# Patient Record
Sex: Male | Born: 1935 | Race: White | Hispanic: No | Smoking: Former smoker
Health system: Southern US, Community
[De-identification: ages and names within clinical notes are randomized; demographics above are authoritative.]

## PROBLEM LIST (undated history)

## (undated) DIAGNOSIS — M5417 Radiculopathy, lumbosacral region: Secondary | ICD-10-CM

## (undated) DIAGNOSIS — I255 Ischemic cardiomyopathy: Secondary | ICD-10-CM

## (undated) DIAGNOSIS — IMO0001 Reserved for inherently not codable concepts without codable children: Secondary | ICD-10-CM

## (undated) DIAGNOSIS — R001 Bradycardia, unspecified: Secondary | ICD-10-CM

## (undated) DIAGNOSIS — C61 Malignant neoplasm of prostate: Secondary | ICD-10-CM

## (undated) DIAGNOSIS — I10 Essential (primary) hypertension: Secondary | ICD-10-CM

## (undated) DIAGNOSIS — R0602 Shortness of breath: Secondary | ICD-10-CM

## (undated) DIAGNOSIS — I219 Acute myocardial infarction, unspecified: Secondary | ICD-10-CM

## (undated) DIAGNOSIS — C44211 Basal cell carcinoma of skin of unspecified ear and external auricular canal: Secondary | ICD-10-CM

## (undated) DIAGNOSIS — I209 Angina pectoris, unspecified: Secondary | ICD-10-CM

## (undated) DIAGNOSIS — M5136 Other intervertebral disc degeneration, lumbar region: Secondary | ICD-10-CM

## (undated) DIAGNOSIS — E785 Hyperlipidemia, unspecified: Secondary | ICD-10-CM

## (undated) DIAGNOSIS — I509 Heart failure, unspecified: Secondary | ICD-10-CM

## (undated) DIAGNOSIS — Z950 Presence of cardiac pacemaker: Secondary | ICD-10-CM

## (undated) DIAGNOSIS — I350 Nonrheumatic aortic (valve) stenosis: Secondary | ICD-10-CM

## (undated) DIAGNOSIS — Z9889 Other specified postprocedural states: Secondary | ICD-10-CM

## (undated) DIAGNOSIS — D649 Anemia, unspecified: Secondary | ICD-10-CM

## (undated) DIAGNOSIS — K8689 Other specified diseases of pancreas: Secondary | ICD-10-CM

## (undated) HISTORY — DX: Basal cell carcinoma of skin of unspecified ear and external auricular canal: C44.211

## (undated) HISTORY — DX: Other specified postprocedural states: Z98.890

## (undated) HISTORY — PX: HERNIA REPAIR: SHX51

## (undated) HISTORY — DX: Shortness of breath: R06.02

## (undated) HISTORY — DX: Hyperlipidemia, unspecified: E78.5

## (undated) HISTORY — DX: Acute myocardial infarction, unspecified: I21.9

## (undated) HISTORY — DX: Other specified diseases of pancreas: K86.89

## (undated) HISTORY — DX: Malignant neoplasm of prostate: C61

## (undated) HISTORY — DX: Bradycardia, unspecified: R00.1

## (undated) HISTORY — PX: CORONARY ANGIOPLASTY: SHX604

## (undated) HISTORY — DX: Anemia, unspecified: D64.9

## (undated) HISTORY — DX: Essential (primary) hypertension: I10

## (undated) HISTORY — DX: Presence of cardiac pacemaker: Z95.0

## (undated) HISTORY — DX: Nonrheumatic aortic (valve) stenosis: I35.0

## (undated) HISTORY — DX: Other intervertebral disc degeneration, lumbar region: M51.36

## (undated) HISTORY — DX: Radiculopathy, lumbosacral region: M54.17

---

## 2005-07-17 ENCOUNTER — Other Ambulatory Visit: Payer: Self-pay

## 2005-07-17 ENCOUNTER — Emergency Department: Payer: Self-pay | Admitting: Emergency Medicine

## 2009-12-30 ENCOUNTER — Ambulatory Visit: Payer: Self-pay | Admitting: Cardiology

## 2010-01-05 ENCOUNTER — Ambulatory Visit: Payer: Self-pay | Admitting: Cardiology

## 2010-03-02 ENCOUNTER — Emergency Department: Payer: Self-pay | Admitting: Emergency Medicine

## 2011-04-09 ENCOUNTER — Ambulatory Visit: Payer: Self-pay | Admitting: Specialist

## 2011-08-06 ENCOUNTER — Ambulatory Visit: Payer: Self-pay | Admitting: Cardiology

## 2011-08-11 ENCOUNTER — Ambulatory Visit: Payer: Self-pay | Admitting: Cardiology

## 2011-10-05 ENCOUNTER — Inpatient Hospital Stay: Payer: Self-pay | Admitting: Internal Medicine

## 2012-10-30 ENCOUNTER — Ambulatory Visit: Payer: Self-pay | Admitting: Surgery

## 2012-10-30 LAB — BASIC METABOLIC PANEL
Anion Gap: 6 — ABNORMAL LOW (ref 7–16)
Calcium, Total: 9.3 mg/dL (ref 8.5–10.1)
Creatinine: 1.2 mg/dL (ref 0.60–1.30)
EGFR (African American): >60
EGFR (Non-African Amer.): 58 — ABNORMAL LOW
Glucose: 95 mg/dL (ref 65–99)
Sodium: 139 mmol/L (ref 136–145)

## 2012-10-30 LAB — CBC
HCT: 40.8 % (ref 40.0–52.0)
MCHC: 34.5 g/dL (ref 32.0–36.0)
MCV: 87 fL (ref 80–100)
RDW: 13.6 % (ref 11.5–14.5)
WBC: 6.1 10*3/uL (ref 3.8–10.6)

## 2012-11-06 ENCOUNTER — Ambulatory Visit: Payer: Self-pay | Admitting: Surgery

## 2012-11-27 DIAGNOSIS — M25559 Pain in unspecified hip: Secondary | ICD-10-CM | POA: Insufficient documentation

## 2012-11-27 DIAGNOSIS — M7060 Trochanteric bursitis, unspecified hip: Secondary | ICD-10-CM | POA: Insufficient documentation

## 2013-06-29 ENCOUNTER — Ambulatory Visit: Payer: Self-pay | Admitting: Cardiology

## 2013-11-20 DIAGNOSIS — M25519 Pain in unspecified shoulder: Secondary | ICD-10-CM | POA: Insufficient documentation

## 2014-03-27 ENCOUNTER — Ambulatory Visit: Payer: Self-pay | Admitting: Cardiology

## 2014-03-27 LAB — CK TOTAL AND CKMB (NOT AT ARMC)
CK, TOTAL: 75 U/L
CK-MB: 1.8 ng/mL (ref 0.5–3.6)

## 2014-03-28 LAB — BASIC METABOLIC PANEL
Anion Gap: 3 — ABNORMAL LOW (ref 7–16)
BUN: 16 mg/dL (ref 7–18)
CREATININE: 1.29 mg/dL (ref 0.60–1.30)
Calcium, Total: 8.8 mg/dL (ref 8.5–10.1)
Chloride: 104 mmol/L (ref 98–107)
Co2: 30 mmol/L (ref 21–32)
EGFR (African American): >60
EGFR (Non-African Amer.): 52 — ABNORMAL LOW
GLUCOSE: 105 mg/dL — AB (ref 65–99)
Osmolality: 275 (ref 275–301)
POTASSIUM: 4.2 mmol/L (ref 3.5–5.1)
Sodium: 137 mmol/L (ref 136–145)

## 2014-07-03 DIAGNOSIS — E785 Hyperlipidemia, unspecified: Secondary | ICD-10-CM

## 2014-07-03 DIAGNOSIS — R001 Bradycardia, unspecified: Secondary | ICD-10-CM

## 2014-07-03 DIAGNOSIS — I2111 ST elevation (STEMI) myocardial infarction involving right coronary artery: Secondary | ICD-10-CM | POA: Insufficient documentation

## 2014-07-03 DIAGNOSIS — I350 Nonrheumatic aortic (valve) stenosis: Secondary | ICD-10-CM

## 2014-07-03 DIAGNOSIS — I219 Acute myocardial infarction, unspecified: Secondary | ICD-10-CM

## 2014-07-03 DIAGNOSIS — Z950 Presence of cardiac pacemaker: Secondary | ICD-10-CM

## 2014-07-03 DIAGNOSIS — Z9889 Other specified postprocedural states: Secondary | ICD-10-CM

## 2014-07-03 DIAGNOSIS — I10 Essential (primary) hypertension: Secondary | ICD-10-CM

## 2014-07-03 HISTORY — DX: Presence of cardiac pacemaker: Z95.0

## 2014-07-03 HISTORY — DX: Hyperlipidemia, unspecified: E78.5

## 2014-07-03 HISTORY — DX: Bradycardia, unspecified: R00.1

## 2014-07-03 HISTORY — DX: Other specified postprocedural states: Z98.890

## 2014-07-03 HISTORY — DX: Essential (primary) hypertension: I10

## 2014-07-03 HISTORY — DX: Nonrheumatic aortic (valve) stenosis: I35.0

## 2014-07-03 HISTORY — DX: Acute myocardial infarction, unspecified: I21.9

## 2014-11-18 DIAGNOSIS — Z85828 Personal history of other malignant neoplasm of skin: Secondary | ICD-10-CM | POA: Insufficient documentation

## 2015-03-31 DIAGNOSIS — C44211 Basal cell carcinoma of skin of unspecified ear and external auricular canal: Secondary | ICD-10-CM

## 2015-03-31 HISTORY — DX: Basal cell carcinoma of skin of unspecified ear and external auricular canal: C44.211

## 2015-04-08 NOTE — Op Note (Signed)
PATIENT NAME:  Kristopher Fernandez, Kristopher Fernandez MR#:  [id number] DATE OF BIRTH:  10/16/1932  DATE OF PROCEDURE:  11/06/2012  PREOPERATIVE DIAGNOSIS: Recurrent left inguinal hernia.   POSTOPERATIVE DIAGNOSIS: Recurrent left inguinal hernia.   PROCEDURE: Left inguinal hernia repair.   SURGEON: Loreli Dollar, MD   ANESTHESIA: General.   INDICATIONS: This 81 year old had had hernia repair in the 1970's and did well until the last few weeks, has been having bulging with stinging-type pain. A hernia was demonstrated on physical exam and repair is recommended for definitive treatment.   DESCRIPTION OF PROCEDURE: The patient was placed on the operating table in the supine position under general anesthesia. The right lower abdomen was clipped and prepared with ChloraPrep and draped in a sterile manner.   An incision was made along the course of the old oblique incision in the left lower quadrant, carried down through subcutaneous tissues. Scarpa's fascia was incised. The external oblique aponeurosis was incised along the course of its fibers to open the external ring and expose the inguinal cord structures. The cord structures were mobilized. There was an indirect inguinal hernia. The sac was dissected free from the cord structures. A Penrose drain was passed around the cord structures for retraction. The sac was some 5 inches in length and was dissected up into the internal ring. The sac was opened. Its continuity with the peritoneal cavity was demonstrated. It was a sliding type hernia so that part of the wall was made at the sigmoid colon. A portion of the sac was excised and the remaining defect was closed with running 4-0 Vicryl suture. The remainder of the sac was inverted and separated from the internal ring. Next, repair was carried out with a row of 0 Surgilon sutures suturing the conjoined tendon to the shelving edge of the inguinal ligament and the defect was closed with continued row of sutures until satisfactory  narrowing of the internal ring. Next, an onlay Atrium mesh was selected and cut to create an oval shape of some 2.8 x 4 cm in dimension. A notch was cut out for the cord structures and the mesh was placed along the floor of the inguinal canal straddling the cord structures and was sutured to the repair with 0 Surgilon. Also, the tails were sutured to the fascia with 0 Surgilon and the medial edge was sutured to the fascia with 0 Surgilon. The repair looked good. The cord structures were replaced along the floor of the inguinal canal. Cut edges of the external oblique aponeurosis were closed with running 4-0 Vicryl. The deep fascia superior and lateral to the repair site was infiltrated with 10 mL of 0.5% Sensorcaine with epinephrine. Subcutaneous tissues were infiltrated with additional 10 mL. The Scarpa's fascia was closed with interrupted 4-0 Vicryl. The skin was closed with running 4-0 Monocryl subcuticular suture and Dermabond. The testicle remained in the scrotum. The patient tolerated the procedure satisfactorily and was prepared for transfer to the recovery room.   ____________________________ Lenna Sciara. Rochel Brome, MD jws:drc D: 11/06/2012 09:13:54 ET T: 11/06/2012 10:18:54 ET JOB#: [id number]  cc: Loreli Dollar, MD, <Dictator> Loreli Dollar MD ELECTRONICALLY SIGNED 11/06/2012 18:38

## 2015-04-12 NOTE — Discharge Summary (Signed)
PATIENT NAME:  Kristopher Fernandez, Kristopher Fernandez MR#:  [id number] DATE OF BIRTH:  1932-02-24  DATE OF ADMISSION:  03/27/2014 DATE OF DISCHARGE:  03/28/2014   FINAL DIAGNOSES:  1. Coronary artery disease.  2. Hypertension.   DISCHARGE MEDICATIONS:  1. Aspirin 81 mg daily.  2. Clopidogrel 75 mg daily.  3. Altace 5 mg daily.  4. Imdur 30 mg daily.  5. Metoprolol succinate 25 mg daily.   PROCEDURES:  1. Cardiac catheterization with selective coronary arteriography on 03/27/2014.  2. Percutaneous coronary intervention on 03/27/2014.   HISTORY OF PRESENT ILLNESS: Please see admission H and [place] COURSE: The patient underwent elective cardiac catheterization on 03/27/2014. Coronary arteriography revealed patent stent mid LAD with a 60% to 70% in-stent restenosis, patent stent in the first obtuse marginal branch, occluded mid left circumflex and high-grade 95% stenosis in the ostium of the right coronary artery. The patient underwent percutaneous coronary intervention receiving overlapping 3.5 x 12 and 3.5 x 8 Xience Alpine stents in the ostium of the right coronary artery, with an excellent angiographic result. Following the procedure, the patient was returned to the telemetry unit, where he remained chest pain free. Postprocedural CPK and MB were 75 and 1.8, respectively. On the morning of 03/28/2014, the patient was ambulating without difficulty and was discharged home. He is scheduled to see me in followup in 1 week.  ____________________________ Isaias Cowman, MD ap:lb D: 03/28/2014 08:26:58 ET T: 03/28/2014 08:40:55 ET JOB#: [id number]  cc: Isaias Cowman, MD, <Dictator> Isaias Cowman MD ELECTRONICALLY SIGNED 04/23/2014 12:43

## 2015-08-06 DIAGNOSIS — M51369 Other intervertebral disc degeneration, lumbar region without mention of lumbar back pain or lower extremity pain: Secondary | ICD-10-CM

## 2015-08-06 DIAGNOSIS — M5136 Other intervertebral disc degeneration, lumbar region: Secondary | ICD-10-CM

## 2015-08-06 HISTORY — DX: Other intervertebral disc degeneration, lumbar region without mention of lumbar back pain or lower extremity pain: M51.369

## 2015-08-06 HISTORY — DX: Other intervertebral disc degeneration, lumbar region: M51.36

## 2015-08-22 DIAGNOSIS — M5417 Radiculopathy, lumbosacral region: Secondary | ICD-10-CM | POA: Insufficient documentation

## 2015-08-22 HISTORY — DX: Radiculopathy, lumbosacral region: M54.17

## 2015-09-14 DIAGNOSIS — D649 Anemia, unspecified: Secondary | ICD-10-CM

## 2015-09-14 HISTORY — DX: Anemia, unspecified: D64.9

## 2015-09-26 DIAGNOSIS — M47816 Spondylosis without myelopathy or radiculopathy, lumbar region: Secondary | ICD-10-CM | POA: Insufficient documentation

## 2016-05-13 DIAGNOSIS — R0602 Shortness of breath: Secondary | ICD-10-CM

## 2016-05-13 HISTORY — DX: Shortness of breath: R06.02

## 2016-07-14 ENCOUNTER — Encounter
Admission: RE | Disposition: A | Discharge status: Home / Self Care | Payer: Self-pay | Source: Ambulatory Visit | Attending: Cardiology

## 2016-07-14 ENCOUNTER — Ambulatory Visit
Admission: RE | Admit: 2016-07-14 | Discharge: 2016-07-14 | Disposition: A | Discharge status: Home / Self Care | Payer: Medicare Other | Source: Ambulatory Visit | Attending: Cardiology | Admitting: Cardiology

## 2016-07-14 ENCOUNTER — Encounter: Payer: Self-pay | Admitting: *Deleted

## 2016-07-14 DIAGNOSIS — I350 Nonrheumatic aortic (valve) stenosis: Secondary | ICD-10-CM | POA: Insufficient documentation

## 2016-07-14 DIAGNOSIS — M1990 Unspecified osteoarthritis, unspecified site: Secondary | ICD-10-CM | POA: Diagnosis not present

## 2016-07-14 DIAGNOSIS — Z950 Presence of cardiac pacemaker: Secondary | ICD-10-CM | POA: Diagnosis not present

## 2016-07-14 DIAGNOSIS — I252 Old myocardial infarction: Secondary | ICD-10-CM | POA: Insufficient documentation

## 2016-07-14 DIAGNOSIS — E7800 Pure hypercholesterolemia, unspecified: Secondary | ICD-10-CM | POA: Insufficient documentation

## 2016-07-14 DIAGNOSIS — Z9889 Other specified postprocedural states: Secondary | ICD-10-CM | POA: Insufficient documentation

## 2016-07-14 DIAGNOSIS — I255 Ischemic cardiomyopathy: Secondary | ICD-10-CM | POA: Insufficient documentation

## 2016-07-14 DIAGNOSIS — Z955 Presence of coronary angioplasty implant and graft: Secondary | ICD-10-CM | POA: Diagnosis not present

## 2016-07-14 DIAGNOSIS — I2510 Atherosclerotic heart disease of native coronary artery without angina pectoris: Secondary | ICD-10-CM | POA: Diagnosis not present

## 2016-07-14 DIAGNOSIS — E785 Hyperlipidemia, unspecified: Secondary | ICD-10-CM | POA: Diagnosis not present

## 2016-07-14 DIAGNOSIS — Z806 Family history of leukemia: Secondary | ICD-10-CM | POA: Insufficient documentation

## 2016-07-14 DIAGNOSIS — R0602 Shortness of breath: Secondary | ICD-10-CM | POA: Diagnosis present

## 2016-07-14 DIAGNOSIS — I420 Dilated cardiomyopathy: Secondary | ICD-10-CM | POA: Insufficient documentation

## 2016-07-14 DIAGNOSIS — R42 Dizziness and giddiness: Secondary | ICD-10-CM | POA: Insufficient documentation

## 2016-07-14 DIAGNOSIS — Z8249 Family history of ischemic heart disease and other diseases of the circulatory system: Secondary | ICD-10-CM | POA: Insufficient documentation

## 2016-07-14 DIAGNOSIS — Z79899 Other long term (current) drug therapy: Secondary | ICD-10-CM | POA: Insufficient documentation

## 2016-07-14 DIAGNOSIS — I10 Essential (primary) hypertension: Secondary | ICD-10-CM | POA: Diagnosis not present

## 2016-07-14 HISTORY — DX: Angina pectoris, unspecified: I20.9

## 2016-07-14 HISTORY — DX: Acute myocardial infarction, unspecified: I21.9

## 2016-07-14 HISTORY — PX: CARDIAC CATHETERIZATION: SHX172

## 2016-07-14 HISTORY — DX: Reserved for inherently not codable concepts without codable children: IMO0001

## 2016-07-14 LAB — BASIC METABOLIC PANEL
Anion gap: 5 (ref 5–15)
BUN: 16 mg/dL (ref 6–20)
CALCIUM: 9.1 mg/dL (ref 8.9–10.3)
CO2: 29 mmol/L (ref 22–32)
CREATININE: 1.26 mg/dL — AB (ref 0.61–1.24)
Chloride: 104 mmol/L (ref 101–111)
GFR calc Af Amer: 60 mL/min — ABNORMAL LOW (ref >60)
GFR calc non Af Amer: 51 mL/min — ABNORMAL LOW (ref >60)
GLUCOSE: 107 mg/dL — AB (ref 65–99)
Potassium: 5 mmol/L (ref 3.5–5.1)
Sodium: 138 mmol/L (ref 135–145)

## 2016-07-14 LAB — PROTIME-INR
INR: 1.07
Prothrombin Time: 14.1 seconds (ref 11.4–15.2)

## 2016-07-14 LAB — CBC
HEMATOCRIT: 40.7 % (ref 40.0–52.0)
Hemoglobin: 13.9 g/dL (ref 13.0–18.0)
MCH: 29.5 pg (ref 26.0–34.0)
MCHC: 34.1 g/dL (ref 32.0–36.0)
MCV: 86.7 fL (ref 80.0–100.0)
Platelets: 117 10*3/uL — ABNORMAL LOW (ref 150–440)
RBC: 4.69 MIL/uL (ref 4.40–5.90)
RDW: 13.5 % (ref 11.5–14.5)
WBC: 5.7 10*3/uL (ref 3.8–10.6)

## 2016-07-14 SURGERY — RIGHT/LEFT HEART CATH AND CORONARY ANGIOGRAPHY
Anesthesia: Moderate Sedation | Laterality: Bilateral

## 2016-07-14 MED ORDER — MIDAZOLAM HCL 2 MG/2ML IJ SOLN
INTRAMUSCULAR | Status: AC
  Filled 2016-07-14: qty 2

## 2016-07-14 MED ORDER — FENTANYL CITRATE (PF) 100 MCG/2ML IJ SOLN
INTRAMUSCULAR | Status: DC | PRN
  Administered 2016-07-14: 25 ug via INTRAVENOUS

## 2016-07-14 MED ORDER — SODIUM CHLORIDE 0.9% FLUSH
3.0000 mL | Freq: Two times a day (BID) | INTRAVENOUS | Status: DC
  Administered 2016-07-14: 3 mL via INTRAVENOUS

## 2016-07-14 MED ORDER — SODIUM CHLORIDE 0.9% FLUSH: 3.0000 mL | INTRAVENOUS | Status: DC | PRN

## 2016-07-14 MED ORDER — SODIUM CHLORIDE 0.9 % IV SOLN: 250.0000 mL | INTRAVENOUS | Status: DC | PRN

## 2016-07-14 MED ORDER — ASPIRIN 81 MG PO CHEW
CHEWABLE_TABLET | ORAL | Status: AC
  Administered 2016-07-14: 81 mg via ORAL
  Filled 2016-07-14: qty 1

## 2016-07-14 MED ORDER — SODIUM CHLORIDE 0.9 % IV SOLN
INTRAVENOUS | Status: DC
  Administered 2016-07-14: 07:00:00 via INTRAVENOUS

## 2016-07-14 MED ORDER — MIDAZOLAM HCL 2 MG/2ML IJ SOLN
INTRAMUSCULAR | Status: DC | PRN
  Administered 2016-07-14: 1 mg via INTRAVENOUS

## 2016-07-14 MED ORDER — SODIUM CHLORIDE 0.9 % WEIGHT BASED INFUSION: 3.0000 mL/kg/h | INTRAVENOUS | Status: DC

## 2016-07-14 MED ORDER — SODIUM CHLORIDE 0.9 % WEIGHT BASED INFUSION: 1.0000 mL/kg/h | INTRAVENOUS | Status: DC

## 2016-07-14 MED ORDER — IOPAMIDOL (ISOVUE-300) INJECTION 61%
INTRAVENOUS | Status: DC | PRN
  Administered 2016-07-14: 120 mL via INTRA_ARTERIAL

## 2016-07-14 MED ORDER — FENTANYL CITRATE (PF) 100 MCG/2ML IJ SOLN
INTRAMUSCULAR | Status: AC
  Filled 2016-07-14: qty 2

## 2016-07-14 MED ORDER — ASPIRIN 81 MG PO CHEW
81.0000 mg | CHEWABLE_TABLET | ORAL | Status: AC
  Administered 2016-07-14: 81 mg via ORAL

## 2016-07-14 MED ORDER — HEPARIN (PORCINE) IN NACL 2-0.9 UNIT/ML-% IJ SOLN
INTRAMUSCULAR | Status: AC
  Filled 2016-07-14: qty 500

## 2016-07-14 SURGICAL SUPPLY — 13 items
CATH INFINITI 5FR JL4 (CATHETERS) ×3 IMPLANT
CATH INFINITI JR4 5F (CATHETERS) ×3 IMPLANT
CATH LANGSTON DUAL LUM PIG 6FR (CATHETERS) ×3 IMPLANT
CATH SWANZ 7F THERMO (CATHETERS) ×3 IMPLANT
DEVICE CLOSURE MYNXGRIP 5F (Vascular Products) IMPLANT
DEVICE CLOSURE MYNXGRIP 6/7F (Vascular Products) ×3 IMPLANT
KIT MANI 3VAL PERCEP (MISCELLANEOUS) ×3 IMPLANT
KIT RIGHT HEART (MISCELLANEOUS) ×3 IMPLANT
NEEDLE PERC 18GX7CM (NEEDLE) ×3 IMPLANT
PACK CARDIAC CATH (CUSTOM PROCEDURE TRAY) ×3 IMPLANT
SHEATH AVANTI 6FR X 11CM (SHEATH) ×3 IMPLANT
SHEATH PINNACLE 7F 10CM (SHEATH) ×3 IMPLANT
WIRE EMERALD 3MM-J .035X150CM (WIRE) ×3 IMPLANT

## 2016-07-15 ENCOUNTER — Encounter: Payer: Self-pay | Admitting: Cardiology

## 2016-10-08 ENCOUNTER — Emergency Department
Admission: EM | Admit: 2016-10-08 | Discharge: 2016-10-08 | Disposition: A | Discharge status: Home / Self Care | Payer: Medicare Other | Attending: Emergency Medicine | Admitting: Emergency Medicine

## 2016-10-08 ENCOUNTER — Encounter: Payer: Self-pay | Admitting: Emergency Medicine

## 2016-10-08 ENCOUNTER — Emergency Department: Payer: Medicare Other

## 2016-10-08 DIAGNOSIS — Z79899 Other long term (current) drug therapy: Secondary | ICD-10-CM | POA: Diagnosis not present

## 2016-10-08 DIAGNOSIS — R079 Chest pain, unspecified: Secondary | ICD-10-CM | POA: Diagnosis not present

## 2016-10-08 DIAGNOSIS — N200 Calculus of kidney: Secondary | ICD-10-CM | POA: Insufficient documentation

## 2016-10-08 DIAGNOSIS — I252 Old myocardial infarction: Secondary | ICD-10-CM | POA: Diagnosis not present

## 2016-10-08 DIAGNOSIS — Z7982 Long term (current) use of aspirin: Secondary | ICD-10-CM | POA: Insufficient documentation

## 2016-10-08 DIAGNOSIS — Z87891 Personal history of nicotine dependence: Secondary | ICD-10-CM | POA: Diagnosis not present

## 2016-10-08 DIAGNOSIS — R109 Unspecified abdominal pain: Secondary | ICD-10-CM | POA: Diagnosis present

## 2016-10-08 LAB — CBC WITH DIFFERENTIAL/PLATELET
BASOS ABS: 0 10*3/uL (ref 0–0.1)
BASOS PCT: 0 %
EOS PCT: 1 %
Eosinophils Absolute: 0.1 10*3/uL (ref 0–0.7)
HCT: 40.4 % (ref 40.0–52.0)
Hemoglobin: 14.2 g/dL (ref 13.0–18.0)
Lymphocytes Relative: 9 %
Lymphs Abs: 0.8 10*3/uL — ABNORMAL LOW (ref 1.0–3.6)
MCH: 30.2 pg (ref 26.0–34.0)
MCHC: 35.1 g/dL (ref 32.0–36.0)
MCV: 86.1 fL (ref 80.0–100.0)
MONO ABS: 0.6 10*3/uL (ref 0.2–1.0)
Monocytes Relative: 7 %
Neutro Abs: 7.2 10*3/uL — ABNORMAL HIGH (ref 1.4–6.5)
Neutrophils Relative %: 83 %
PLATELETS: 123 10*3/uL — AB (ref 150–440)
RBC: 4.69 MIL/uL (ref 4.40–5.90)
RDW: 13.8 % (ref 11.5–14.5)
WBC: 8.7 10*3/uL (ref 3.8–10.6)

## 2016-10-08 LAB — COMPREHENSIVE METABOLIC PANEL
ALT: 18 U/L (ref 17–63)
ANION GAP: 6 (ref 5–15)
AST: 27 U/L (ref 15–41)
Albumin: 3.8 g/dL (ref 3.5–5.0)
Alkaline Phosphatase: 104 U/L (ref 38–126)
BUN: 20 mg/dL (ref 6–20)
CHLORIDE: 106 mmol/L (ref 101–111)
CO2: 27 mmol/L (ref 22–32)
Calcium: 9.4 mg/dL (ref 8.9–10.3)
Creatinine, Ser: 1.32 mg/dL — ABNORMAL HIGH (ref 0.61–1.24)
GFR calc Af Amer: 56 mL/min — ABNORMAL LOW (ref >60)
GFR, EST NON AFRICAN AMERICAN: 49 mL/min — AB (ref >60)
Glucose, Bld: 125 mg/dL — ABNORMAL HIGH (ref 65–99)
POTASSIUM: 4 mmol/L (ref 3.5–5.1)
Sodium: 139 mmol/L (ref 135–145)
Total Bilirubin: 1.1 mg/dL (ref 0.3–1.2)
Total Protein: 7.1 g/dL (ref 6.5–8.1)

## 2016-10-08 LAB — URINALYSIS COMPLETE WITH MICROSCOPIC (ARMC ONLY)
BILIRUBIN URINE: NEGATIVE
Bacteria, UA: NONE SEEN
Glucose, UA: NEGATIVE mg/dL
KETONES UR: NEGATIVE mg/dL
NITRITE: NEGATIVE
PH: 6 (ref 5.0–8.0)
Protein, ur: NEGATIVE mg/dL
SPECIFIC GRAVITY, URINE: 1.036 — AB (ref 1.005–1.030)
SQUAMOUS EPITHELIAL / LPF: NONE SEEN

## 2016-10-08 LAB — TROPONIN I

## 2016-10-08 MED ORDER — MORPHINE SULFATE (PF) 2 MG/ML IV SOLN
4.0000 mg | Freq: Once | INTRAVENOUS | Status: AC
  Administered 2016-10-08: 4 mg via INTRAVENOUS
  Filled 2016-10-08: qty 2

## 2016-10-08 MED ORDER — MORPHINE SULFATE (PF) 2 MG/ML IV SOLN
INTRAVENOUS | Status: AC
  Administered 2016-10-08: 4 mg via INTRAVENOUS
  Filled 2016-10-08: qty 1

## 2016-10-08 MED ORDER — ONDANSETRON 4 MG PO TBDP: 4.0000 mg | ORAL_TABLET | Freq: Three times a day (TID) | ORAL | 0 refills | Status: DC | PRN

## 2016-10-08 MED ORDER — ACETAMINOPHEN 500 MG PO TABS: 1000.0000 mg | ORAL_TABLET | Freq: Three times a day (TID) | ORAL | 0 refills | Status: AC

## 2016-10-08 MED ORDER — TAMSULOSIN HCL 0.4 MG PO CAPS: 0.4000 mg | ORAL_CAPSULE | Freq: Every day | ORAL | 0 refills | Status: DC

## 2016-10-08 MED ORDER — OXYCODONE HCL 5 MG PO TABS: 5.0000 mg | ORAL_TABLET | ORAL | 0 refills | Status: DC | PRN

## 2016-10-08 MED ORDER — HYDROMORPHONE HCL 1 MG/ML IJ SOLN
1.0000 mg | Freq: Once | INTRAMUSCULAR | Status: AC
  Administered 2016-10-08: 1 mg via INTRAVENOUS
  Filled 2016-10-08: qty 1

## 2016-10-08 MED ORDER — MORPHINE SULFATE (PF) 2 MG/ML IV SOLN
4.0000 mg | Freq: Once | INTRAVENOUS | Status: AC
  Administered 2016-10-08: 4 mg via INTRAVENOUS

## 2016-10-08 MED ORDER — ONDANSETRON HCL 4 MG/2ML IJ SOLN
4.0000 mg | Freq: Once | INTRAMUSCULAR | Status: AC
  Administered 2016-10-08: 4 mg via INTRAVENOUS
  Filled 2016-10-08: qty 2

## 2016-10-08 MED ORDER — OXYCODONE HCL 5 MG PO TABS
5.0000 mg | ORAL_TABLET | Freq: Once | ORAL | Status: AC
  Administered 2016-10-08: 5 mg via ORAL
  Filled 2016-10-08: qty 1

## 2016-10-08 MED ORDER — ACETAMINOPHEN 500 MG PO TABS
1000.0000 mg | ORAL_TABLET | Freq: Once | ORAL | Status: AC
  Administered 2016-10-08: 1000 mg via ORAL
  Filled 2016-10-08: qty 2

## 2016-10-08 MED ORDER — ONDANSETRON HCL 4 MG/2ML IJ SOLN
4.0000 mg | Freq: Once | INTRAMUSCULAR | Status: AC
  Administered 2016-10-08: 4 mg via INTRAVENOUS

## 2016-10-08 MED ORDER — ONDANSETRON HCL 4 MG/2ML IJ SOLN
INTRAMUSCULAR | Status: AC
  Administered 2016-10-08: 4 mg via INTRAVENOUS
  Filled 2016-10-08: qty 2

## 2016-10-08 MED ORDER — TAMSULOSIN HCL 0.4 MG PO CAPS
0.4000 mg | ORAL_CAPSULE | Freq: Once | ORAL | Status: AC
  Administered 2016-10-08: 0.4 mg via ORAL
  Filled 2016-10-08: qty 1

## 2016-10-08 MED ORDER — IOPAMIDOL (ISOVUE-370) INJECTION 76%
100.0000 mL | Freq: Once | INTRAVENOUS | Status: AC | PRN
  Administered 2016-10-08: 100 mL via INTRAVENOUS

## 2016-10-08 NOTE — ED Triage Notes (Signed)
Pt to ed with c/o acute onset of abd pain that radiates to back and vomiting this am.  Cardiac hx.

## 2016-10-08 NOTE — ED Provider Notes (Signed)
[facility] Emergency Department Provider Note  ____________________________________________  Time seen: Approximately 12:19 PM  I have reviewed the triage vital signs and the nursing notes.   HISTORY  Chief Complaint Abdominal Pain   HPI Kristopher Fernandez is a 82 y.o. male with a history of CAD status post stents in 2006/2011/2015, sick sinus syndrome status post pacemaker 2012, hypertension, hyperlipidemia who presents for evaluation of abdominal pain. Patient reports sudden onset of right-sided abdominal pain radiating to his chest and back. 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 The patient is sharp and 10 out of 10. Patient keeps saying "I think I am dying". Patient has never had similar pain before. He hasn't tried anything at home for the pain. He reports that he was doing well until the pain started. He has had multiple episodes of nonbloody nonbilious emesis and also dizziness. Patient denies prior abdominal surgeries, history of kidney stones, fever, chills, shortness of breath, hematuria, dysuria.   Past Medical History:  Diagnosis Date  . Anginal pain ([place])   . Myocardial infarction   . Shortness of breath dyspnea     There are no active problems to display for this patient.   Past Surgical History:  Procedure Laterality Date  . CARDIAC CATHETERIZATION Bilateral 07/14/2016   Procedure: Right/Left Heart Cath and Coronary Angiography;  Surgeon: Isaias Cowman, MD;  Location: [facility];  Service: Cardiovascular;  Laterality: Bilateral;  . CORONARY ANGIOPLASTY    . HERNIA REPAIR      Prior to Admission medications   Medication Sig Start Date End Date Taking? Authorizing Provider  aspirin EC 81 MG tablet Take 81 mg by mouth daily.   Yes Historical Provider, MD  atorvastatin (LIPITOR) 80 MG tablet Take 80 mg by mouth daily.   Yes Historical Provider, MD  clopidogrel (PLAVIX) 75 MG tablet Take  75 mg by mouth daily.   Yes Historical Provider, MD  metoprolol succinate (TOPROL-XL) 25 MG 24 hr tablet Take 25 mg by mouth daily.   Yes Historical Provider, MD  ramipril (ALTACE) 5 MG capsule Take 5 mg by mouth daily.   Yes Historical Provider, MD  acetaminophen (TYLENOL) 500 MG tablet Take 2 tablets (1,000 mg total) by mouth 3 (three) times daily. 10/08/16 10/15/16  Rudene Re, MD  ondansetron (ZOFRAN ODT) 4 MG disintegrating tablet Take 1 tablet (4 mg total) by mouth every 8 (eight) hours as needed for nausea or vomiting. 10/08/16   Rudene Re, MD  oxyCODONE (ROXICODONE) 5 MG immediate release tablet Take 1 tablet (5 mg total) by mouth every 4 (four) hours as needed. 10/08/16 10/08/17  Rudene Re, MD  tamsulosin (FLOMAX) 0.4 MG CAPS capsule Take 1 capsule (0.4 mg total) by mouth daily. 10/08/16 10/15/16  Rudene Re, MD    Allergies Review of patient's allergies indicates no known allergies.  History reviewed. No pertinent family history.  Social History Social History  Substance Use Topics  . Smoking status: Former Smoker    Years: 10.00    Types: Cigarettes  . Smokeless tobacco: Former Systems developer  . Alcohol use No    Review of Systems  Constitutional: Negative for fever. Eyes: Negative for visual changes. ENT: Negative for sore throat. Cardiovascular: + chest pain. Respiratory: Negative for shortness of breath. Gastrointestinal: + abdominal pain and vomiting. No diarrhea. Genitourinary: Negative for dysuria. Musculoskeletal: + R flank pain. Negative for back pain. Skin: Negative for rash. Neurological: Negative for headaches, weakness  or numbness.  ____________________________________________   PHYSICAL EXAM:  VITAL SIGNS: ED Triage Vitals  Enc Vitals Group     BP 10/08/16 1102 (!) 214/92     Pulse Rate 10/08/16 1102 70     Resp 10/08/16 1102 18     Temp 10/08/16 1102 97.6 F (36.4 C)     Temp src --      SpO2 10/08/16 1102 97 %      Weight 10/08/16 1100 200 lb (90.7 kg)     Height --      Head Circumference --      Peak Flow --      Pain Score 10/08/16 1100 10     Pain Loc --      Pain Edu? --      Excl. in [place]? --     Constitutional: Alert and oriented, pale, diaphoretic, in severe distress. HEENT:      Head: Normocephalic and atraumatic.         Eyes: Conjunctivae are normal. Sclera is non-icteric. EOMI. PERRL      Mouth/Throat: Mucous membranes are moist.       Neck: Supple with no signs of meningismus. Cardiovascular: Regular rate and rhythm. No murmurs, gallops, or rubs. 2+ symmetrical distal pulses are present in all extremities. No JVD. Respiratory: Normal respiratory effort. Lungs are clear to auscultation bilaterally. No wheezes, crackles, or rhonchi.  Gastrointestinal: Soft,  diffusely tender to palpation, no rebound or guarding  Genitourinary: No CVA tenderness. Musculoskeletal: Nontender with normal range of motion in all extremities. No edema, cyanosis, or erythema of extremities. Neurologic: Normal speech and language. Face is symmetric. Moving all extremities. No gross focal neurologic deficits are appreciated. Skin: Skin is warm, dry and intact. No rash noted. Psychiatric: Mood and affect are normal. Speech and behavior are normal.  ____________________________________________   LABS (all labs ordered are listed, but only abnormal results are displayed)  Labs Reviewed  CBC WITH DIFFERENTIAL/PLATELET - Abnormal; Notable for the following:       Result Value   Platelets 123 (*)    Neutro Abs 7.2 (*)    Lymphs Abs 0.8 (*)    All other components within normal limits  COMPREHENSIVE METABOLIC PANEL - Abnormal; Notable for the following:    Glucose, Bld 125 (*)    Creatinine, Ser 1.32 (*)    GFR calc non Af Amer 49 (*)    GFR calc Af Amer 56 (*)    All other components within normal limits  URINALYSIS COMPLETEWITH MICROSCOPIC ([facility] ONLY) - Abnormal; Notable for the following:    Color, Urine  YELLOW (*)    APPearance CLEAR (*)    Specific Gravity, Urine 1.036 (*)    Hgb urine dipstick 2+ (*)    Leukocytes, UA TRACE (*)    All other components within normal limits  URINE CULTURE  TROPONIN I   ____________________________________________  EKG  ED ECG REPORT I, Rudene Re, the attending physician, personally viewed and interpreted this ECG.  Atrial paced rhythm, rate of 65, normal QTc interval, no ST elevations, ST depressions inferior lateral leads. Unchanged from prior ____________________________________________  RADIOLOGY  CTA c/a/p:    1. 4 mm stone within the proximal right ureter causing mild hydronephrosis and periureteral inflammation. 2. Aortic atherosclerosis. No aortic aneurysm or dissection. Additional atherosclerotic changes, as detailed above. No acute appearing vascular abnormality. 3. Cholelithiasis without evidence of acute cholecystitis. 4. Colonic diverticulosis without evidence of acute diverticulitis. 5. Degenerative changes of the thoracolumbar spine,  as detailed above. Subtle mottling of the T10 through L1 vertebral bodies without circumscribed mass or lesion, favored to represent asymmetric osteopenia or sequela of adjacent chronic degenerative change, neoplastic process is considered much less likely. Would consider confirmation of benignity with nonemergent nuclear medicine bone scan. 6. Small hiatal hernia. ____________________________________________   PROCEDURES  Procedure(s) performed: None Procedures Critical Care performed:  None ____________________________________________   INITIAL IMPRESSION / ASSESSMENT AND PLAN / ED COURSE  82 y.o. male with a history of CAD status post stents in 2006/2011/2015, sick sinus syndrome status post pacemaker 2012, hypertension, hyperlipidemia who presents for evaluation of sudden onset severe abdominal pain dating to the chest and back. Patient is pale and diaphoretic, in significant  distress, has strong pulses 4, diffuse tenderness in his abdomen with no rebound or guarding. Bedside ultrasound showing no intra-abdominal fluid. Unable to visualize the aorta due to loops of bowel. EKG with no evidence of ischemia. Presentation was concerning initially for a dissection and patient was rushed to CT scan with pacer pads in place. Patient was given morphine and fluids for the pain. CT scan was negative for dissection showing a 49mm R sided proximal kidney stone. Plan for UA, pain control.  Clinical Course  Comment By Time  Patient still having pain. UA no evidence of infection. We will transition to by mouth pain meds at this time. Rudene Re, MD 10/20 1405  Patient's pain well controlled with by mouth medication. Patient no longer vomiting. UA with no evidence of urinary tract infection. We'll discharge patient home with Tylenol 1000 mg 3 times a day, oxycodone 5 mg every 4 hours, Flomax, Zofran, and close follow-up with urology. I discussed return precautions with patient and his wife for pain that is not well controlled at home or any signs of infection including dysuria, frequency, severe nausea or vomiting, fever or chills. I instructed them to return to the emergency room if the symptoms develop and they are in agreement at this time. We'll discharge patient home. Rudene Re, MD 10/20 1516    Pertinent labs & imaging results that were available during my care of the patient were reviewed by me and considered in my medical decision making (see chart for details).    ____________________________________________   FINAL CLINICAL IMPRESSION(S) / ED DIAGNOSES  Final diagnoses:  Kidney stone      NEW MEDICATIONS STARTED DURING THIS VISIT:  New Prescriptions   ACETAMINOPHEN (TYLENOL) 500 MG TABLET    Take 2 tablets (1,000 mg total) by mouth 3 (three) times daily.   ONDANSETRON (ZOFRAN ODT) 4 MG DISINTEGRATING TABLET    Take 1 tablet (4 mg total) by mouth every  8 (eight) hours as needed for nausea or vomiting.   OXYCODONE (ROXICODONE) 5 MG IMMEDIATE RELEASE TABLET    Take 1 tablet (5 mg total) by mouth every 4 (four) hours as needed.   TAMSULOSIN (FLOMAX) 0.4 MG CAPS CAPSULE    Take 1 capsule (0.4 mg total) by mouth daily.     Note:  This document was prepared using Dragon voice recognition software and may include unintentional dictation errors.    Rudene Re, MD 10/08/16 [phone]

## 2016-10-08 NOTE — Discharge Instructions (Signed)
You have been seen in the Emergency Department (ED)  Today and was diagnosed with kidney stones. While the stone is traveling through the ureter, which is the tube that carries urine from the kidney to the bladder, you will probably feel pain. The pain may be mild or very severe. You may also have some blood in your urine. As soon as the stone reaches the bladder, any intense pain should go away. If a stone is too large to pass on its own, you may need a medical procedure to help you pass the stone.   As we have discussed, please drink plenty of fluids and use a urinary strainer to attempt to capture the stone.  Please make a follow up appointment with Urology in the next week by calling the number below and bring the stone with you.  Take tylenol 1000mg  every 8 hours for the pain. If the pain is not well controlled with tylenol you may take one to two oxycodone every 4 hours.   Follow-up with your doctor or return to the ER in 12-24 hours if your pain is not well controlled, if you develop pain or burning with urination, or if you develop a fever. Otherwise follow up in 3-5 days with your doctor.  When should you call for help?  Call your doctor now or seek immediate medical care if:  You cannot keep down fluids.  Your pain gets worse.  You have a fever or chills.  You have new or worse pain in your back just below your rib cage (the flank area).  You have new or more blood in your urine. You have pain or burning with urination You are unable to urinate You have abdominal pain  Watch closely for changes in your health, and be sure to contact your doctor if:  You do not get better as expected  How can you care for yourself at home?  Drink plenty of fluids, enough so that your urine is light yellow or clear like water. If you have kidney, heart, or liver disease and have to limit fluids, talk with your doctor before you increase the amount of fluids you drink.  Take pain medicines exactly as  directed. Call your doctor if you think you are having a problem with your medicine.  If the doctor gave you a prescription medicine for pain, take it as prescribed.  If you are not taking a prescription pain medicine, ask your doctor if you can take an over-the-counter medicine. Read and follow all instructions on the label. Your doctor may ask you to strain your urine so that you can collect your kidney stone when it passes. You can use a kitchen strainer or a tea strainer to catch the stone. Store it in a plastic bag until you see your doctor again.  Preventing future kidney stones  Some changes in your diet may help prevent kidney stones. Depending on the cause of your stones, your doctor may recommend that you:  Drink plenty of fluids, enough so that your urine is light yellow or clear like water. If you have kidney, heart, or liver disease and have to limit fluids, talk with your doctor before you increase the amount of fluids you drink.  Limit coffee, tea, and alcohol. Also avoid grapefruit juice.  Do not take more than the recommended daily dose of vitamins C and D.  Avoid antacids such as Gaviscon, Maalox, Mylanta, or Tums.  Limit the amount of salt (sodium) in your diet.  Eat a balanced diet that is not too high in protein.  Limit foods that are high in a substance called oxalate, which can cause kidney stones. These foods include dark green vegetables, rhubarb, chocolate, wheat bran, nuts, cranberries, and beans.

## 2016-10-09 LAB — URINE CULTURE

## 2016-10-12 ENCOUNTER — Ambulatory Visit (INDEPENDENT_AMBULATORY_CARE_PROVIDER_SITE_OTHER): Payer: Medicare Other | Admitting: Urology

## 2016-10-12 NOTE — Progress Notes (Signed)
Note opened in error, apt rescheduled later in week

## 2016-10-15 ENCOUNTER — Ambulatory Visit (INDEPENDENT_AMBULATORY_CARE_PROVIDER_SITE_OTHER): Payer: Medicare Other | Admitting: Urology

## 2016-10-15 ENCOUNTER — Encounter: Payer: Self-pay | Admitting: Urology

## 2016-10-15 ENCOUNTER — Telehealth: Payer: Self-pay

## 2016-10-15 ENCOUNTER — Ambulatory Visit
Admission: RE | Admit: 2016-10-15 | Discharge: 2016-10-15 | Disposition: A | Discharge status: Home / Self Care | Payer: Medicare Other | Source: Ambulatory Visit | Attending: Urology | Admitting: Urology

## 2016-10-15 VITALS — BP 158/69 | HR 72 | Ht 72.0 in | Wt 200.0 lb

## 2016-10-15 DIAGNOSIS — N201 Calculus of ureter: Secondary | ICD-10-CM | POA: Diagnosis not present

## 2016-10-15 DIAGNOSIS — K8020 Calculus of gallbladder without cholecystitis without obstruction: Secondary | ICD-10-CM | POA: Diagnosis not present

## 2016-10-15 DIAGNOSIS — N183 Chronic kidney disease, stage 3 unspecified: Secondary | ICD-10-CM

## 2016-10-15 DIAGNOSIS — N200 Calculus of kidney: Secondary | ICD-10-CM

## 2016-10-15 LAB — URINALYSIS, COMPLETE
Bilirubin, UA: NEGATIVE
Glucose, UA: NEGATIVE
Ketones, UA: NEGATIVE
NITRITE UA: NEGATIVE
PH UA: 5 (ref 5.0–7.5)
PROTEIN UA: NEGATIVE
Specific Gravity, UA: 1.025 (ref 1.005–1.030)
UUROB: 0.2 mg/dL (ref 0.2–1.0)

## 2016-10-15 LAB — MICROSCOPIC EXAMINATION

## 2016-10-15 NOTE — Telephone Encounter (Signed)
-----   Message from Hollice Espy, MD sent at 10/15/2016  1:06 PM EDT ----- Looks like stone is still there.  Recommend continue to strain urine, drink plenty of water and return in 2 weeks for KUB.  Please arrange for this f/u.  Hollice Espy, MD

## 2016-10-15 NOTE — Progress Notes (Signed)
10/15/2016 10:44 AM   Kristopher Fernandez 1932-04-10 AN:[id number]  Referring provider: Glendon Axe, MD [facility] [place], [place] [address]  Chief Complaint  Patient presents with  . Nephrolithiasis    New Patient    HPI: 82 year old male who presents today for follow-up after emergency room visit on 10/08/2016 with acute onset abdominal pain radiating to his back with associated vomiting. He underwent a CT angio of the chest abdomen and pelvis to rule out aortic dissection at which time a 4 mm right mid ureteral stone with proximal hydronephrosis was appreciated.  Today, he reports that he has not had pain since Monday (4 days ago).  The pain migrated to his RLQ then stopped.  He has been training his urine but has yet to see the stone.  UA was unremarkable.  Urine culture grew mixed flora consistent with contamination.  No leukocytosis.  Incidentally, he does have bilateral renal cysts which appear to be simple.  He does have CKD, stage III, most recent creatinine 1.32 which is around his baseline.  No previous history of stones.   PMH: Past Medical History:  Diagnosis Date  . Anginal pain ([place])   . Aortic stenosis 07/03/2014   Overview:  Mild with calculated aortic valve area of 1.05cm2  . Basal cell carcinoma, ear 03/31/2015  . Bradycardia 07/03/2014  . DDD (degenerative disc disease), lumbar 08/06/2015  . H/O cardiac catheterization 07/03/2014   Overview:  Cypher stent proximal and distal RCA 07/18/05 and TAXUS stent mid LAD 07/20/05 at [facility]  . HTN (hypertension) 07/03/2014  . Hyperlipidemia 07/03/2014  . Lumbosacral radiculopathy at S1 08/22/2015  . MI (myocardial infarction) 07/03/2014   Overview:  Mi 07/17/05  . Myocardial infarction   . Normocytic normochromic anemia 09/14/2015  . Pacemaker 07/03/2014   Overview:  Dual chamber pacemaker 08/11/11  . Shortness of breath dyspnea   . SOB (shortness of breath) on exertion 05/13/2016    Surgical  History: Past Surgical History:  Procedure Laterality Date  . CARDIAC CATHETERIZATION Bilateral 07/14/2016   Procedure: Right/Left Heart Cath and Coronary Angiography;  Surgeon: Isaias Cowman, MD;  Location: [facility];  Service: Cardiovascular;  Laterality: Bilateral;  . CORONARY ANGIOPLASTY    . HERNIA REPAIR      Home Medications:    Medication List       Accurate as of 10/15/16 10:44 AM. Always use your most recent med list.          acetaminophen 500 MG tablet Commonly known as:  TYLENOL Take 2 tablets (1,000 mg total) by mouth 3 (three) times daily.   aspirin EC 81 MG tablet Take 81 mg by mouth daily.   atorvastatin 80 MG tablet Commonly known as:  LIPITOR Take 80 mg by mouth daily.   clopidogrel 75 MG tablet Commonly known as:  PLAVIX Take 75 mg by mouth daily.   metoprolol succinate 25 MG 24 hr tablet Commonly known as:  TOPROL-XL Take 25 mg by mouth daily.   ramipril 5 MG capsule Commonly known as:  ALTACE Take 5 mg by mouth daily.       Allergies: No Known Allergies  Family History: Family History  Problem Relation Age of Onset  . Bladder Cancer Neg Hx   . Kidney cancer Neg Hx   . Prostate cancer Neg Hx     Social History:  reports that he has quit smoking. His smoking use included Cigarettes. He quit after 10.00 years of use.  He has quit using smokeless tobacco. He reports that he does not drink alcohol or use drugs.  ROS: UROLOGY Frequent Urination?: Yes Hard to postpone urination?: No Burning/pain with urination?: No Get up at night to urinate?: Yes Leakage of urine?: No Urine stream starts and stops?: No Trouble starting stream?: No Do you have to strain to urinate?: No Blood in urine?: No Urinary tract infection?: No Sexually transmitted disease?: No Injury to kidneys or bladder?: No Painful intercourse?: No Weak stream?: No Erection problems?: Yes Penile pain?: No  Gastrointestinal Nausea?: No Vomiting?:  No Indigestion/heartburn?: No Diarrhea?: No Constipation?: No  Constitutional Fever: No Night sweats?: No Weight loss?: No Fatigue?: No  Skin Skin rash/lesions?: No Itching?: No  Eyes Blurred vision?: No Double vision?: No  Ears/Nose/Throat Sore throat?: No Sinus problems?: No  Hematologic/Lymphatic Swollen glands?: No Easy bruising?: No  Cardiovascular Leg swelling?: No Chest pain?: No  Respiratory Cough?: No Shortness of breath?: No  Endocrine Excessive thirst?: No  Musculoskeletal Back pain?: No Joint pain?: No  Neurological Headaches?: No Dizziness?: No  Psychologic Depression?: No Anxiety?: No  Physical Exam: BP (!) 158/69   Pulse 72   Ht 6' (1.829 m)   Wt 200 lb (90.7 kg)   BMI 27.12 kg/m   Constitutional:  Alert and oriented, No acute distress.  Accompanied by wife today. HEENT: [place] AT, moist mucus membranes.  Trachea midline, no masses. Cardiovascular: No clubbing, cyanosis.  1+ bilateral LE edema. Respiratory: Normal respiratory effort, no increased work of breathing. GI: Abdomen is soft, nontender, nondistended, no abdominal masses GU: No CVA tenderness.  Skin: No rashes, bruises or suspicious lesions. Neurologic: Grossly intact, no focal deficits, moving all 4 extremities. Psychiatric: Normal mood and affect.  Laboratory Data: Lab Results  Component Value Date   WBC 8.7 10/08/2016   HGB 14.2 10/08/2016   HCT 40.4 10/08/2016   MCV 86.1 10/08/2016   PLT 123 (L) 10/08/2016    Lab Results  Component Value Date   CREATININE 1.32 (H) 10/08/2016   Urinalysis Component     Latest Ref Rng & Units 10/08/2016  Color, Urine     YELLOW YELLOW (A)  Appearance     CLEAR CLEAR (A)  Glucose     NEGATIVE mg/dL NEGATIVE  Bilirubin Urine     NEGATIVE NEGATIVE  Ketones, ur     NEGATIVE mg/dL NEGATIVE  Specific Gravity, Urine     1.005 - 1.030 1.036 (H)  Hgb urine dipstick     NEGATIVE 2+ (A)  pH     5.0 - 8.0 6.0  Protein      NEGATIVE mg/dL NEGATIVE  Nitrite     NEGATIVE NEGATIVE  Leukocytes, UA     NEGATIVE TRACE (A)  RBC / HPF     0 - 5 RBC/hpf 0-5  WBC, UA     0 - 5 WBC/hpf 0-5  Bacteria, UA     NONE SEEN NONE SEEN  Squamous Epithelial / LPF     NONE SEEN NONE SEEN  Mucous      PRESENT   Results for orders placed or performed in visit on 10/15/16  Microscopic Examination  Result Value Ref Range   WBC, UA 6-10 (A) 0 - 5 /hpf   RBC, UA 0-2 0 - 2 /hpf   Epithelial Cells (non renal) 0-10 0 - 10 /hpf   Bacteria, UA Few None seen/Few  Urinalysis, Complete  Result Value Ref Range   Specific Gravity, UA 1.025 1.005 - 1.030  pH, UA 5.0 5.0 - 7.5   Color, UA Yellow Yellow   Appearance Ur Clear Clear   Leukocytes, UA Trace (A) Negative   Protein, UA Negative Negative/Trace   Glucose, UA Negative Negative   Ketones, UA Negative Negative   RBC, UA Trace (A) Negative   Bilirubin, UA Negative Negative   Urobilinogen, Ur 0.2 0.2 - 1.0 mg/dL   Nitrite, UA Negative Negative   Microscopic Examination See below:     Pertinent Imaging: CLINICAL DATA:  Pt to ed with c/o acute onset of abd pain that radiates to back and vomiting this am. Cardiac hx. Not to wait on labs per Dr. Alfred Levins. TKV  EXAM: CT ANGIOGRAPHY CHEST, ABDOMEN AND PELVIS  TECHNIQUE: Multidetector CT imaging through the chest, abdomen and pelvis was performed using the standard protocol during bolus administration of intravenous contrast. Multiplanar reconstructed images and MIPs were obtained and reviewed to evaluate the vascular anatomy.  CONTRAST:  100 cc Isovue 370  COMPARISON:  CT abdomen dated 10/05/2011.  FINDINGS: CTA CHEST FINDINGS  Cardiovascular: Scattered atherosclerotic changes along the walls of the normal caliber thoracic aorta. No aortic aneurysm or dissection.  Heart size is normal. No pericardial effusion. Diffuse coronary artery calcifications noted. No central obstructing pulmonary embolism  seen.  Mediastinum/Nodes: No masses or enlarged lymph nodes seen within the mediastinum, perihilar or axillary regions. Small hiatal hernia. Esophagus otherwise normal. Trachea appears normal.  Lungs/Pleura: Lungs are clear.  No pleural effusion or pneumothorax.  Musculoskeletal: Degenerative spurring and ankylosis throughout the thoracic spine, degenerative changes mild to moderate in degree. Slight mottling of the T10 through L1 vertebral bodies without circumscribed mass or lesion, favored to be asymmetric osteopenia and/or related to adjacent degenerative change, but neoplastic process cannot be confidently excluded. No other acute/significant osseous finding.  Review of the MIP images confirms the above findings.  CTA ABDOMEN AND PELVIS FINDINGS  VASCULAR  Aorta: Scattered atherosclerotic changes along the walls of the normal caliber abdominal aorta. Additional atherosclerotic changes along the walls of the pelvic vasculature and aortic branch vessels.  No aortic aneurysm or dissection. No vascular occlusion. No acute appearing vascular abnormality.  Celiac: Mild atherosclerotic changes of the celiac artery branches without occlusion or evidence of significant stenosis.  SMA: Patent and normal in caliber throughout.  Renals: Atherosclerotic changes at each renal artery takeoff. Normal flow within the more distal portions of each renal artery.  IMA: Atherosclerotic narrowing at the origin of the IMA, but normal flow demonstrated in the more peripheral IMA and branches.  Inflow: Scattered atherosclerotic changes without occlusion or evidence of significant stenosis.  Veins: Unremarkable.  Review of the MIP images confirms the above findings.  NON-VASCULAR  Hepatobiliary: Multiple irregular stones within the gallbladder. No gallbladder wall thickening, pericholecystic edema or other convincing signs of acute cholecystitis, although a portion of  the gallbladder wall is somewhat indistinct. Liver appears normal. No bile duct dilatation.  Pancreas: Unremarkable. No pancreatic ductal dilatation or surrounding inflammatory changes.  Spleen: Normal in size without focal abnormality.  Adrenals/Urinary Tract: 4 mm stone within the proximal right ureter causing mild hydronephrosis. Bilateral renal cysts.  Stomach/Bowel: Fairly extensive diverticulosis of the sigmoid colon without evidence of acute diverticulitis. No dilated large or small bowel loops. No evidence of bowel wall inflammation. Appendix is normal. Stomach appears normal, other than the small hiatal hernia.  Lymphatic: No enlarged lymph nodes seen in the abdomen or pelvis.  Reproductive: Unremarkable.  Other: No free fluid or abscess collection. No free  intraperitoneal air.  Musculoskeletal: Degenerative changes of the lower lumbar spine. No acute appearing osseous abnormality.  Review of the MIP images confirms the above findings.  IMPRESSION: 1. 4 mm stone within the proximal right ureter causing mild hydronephrosis and periureteral inflammation. 2. Aortic atherosclerosis. No aortic aneurysm or dissection. Additional atherosclerotic changes, as detailed above. No acute appearing vascular abnormality. 3. Cholelithiasis without evidence of acute cholecystitis. 4. Colonic diverticulosis without evidence of acute diverticulitis. 5. Degenerative changes of the thoracolumbar spine, as detailed above. Subtle mottling of the T10 through L1 vertebral bodies without circumscribed mass or lesion, favored to represent asymmetric osteopenia or sequela of adjacent chronic degenerative change, neoplastic process is considered much less likely. Would consider confirmation of benignity with nonemergent nuclear medicine bone scan. 6. Small hiatal hernia.   Electronically Signed   By: Franki Cabot M.D.   On: 10/08/2016 11:56  CT scan reviewed personally  today with the patient.  Assessment & Plan:    1. Right ureteral stone Asymptomatic, has likely passed stone given absence of symptoms but recommend KUB today for assurance Based on the size of the stone, he is an excellent candidate for medical expulsive therapy If stone is still present, would recommend follow-up in 2 weeks with a repeat KUB Encouraged to drink plenty of water and strain urine if stone is yet to pass Warning symptoms are reviewed today in detail Stone prevention techniques were discussed today - Urinalysis, Complete - DG Abd 1 View  2. Stage 3 chronic kidney disease Renal function stable   Hollice Espy, MD  [facility] [address], [place], [place] [address] [phone]

## 2016-10-15 NOTE — Patient Instructions (Signed)
Dietary Guidelines to Help Prevent Kidney Stones Your risk of kidney stones can be decreased by adjusting the foods you eat. The most important thing you can do is drink enough fluid. You should drink enough fluid to keep your urine clear or pale yellow. The following guidelines provide specific information for the type of kidney stone you have had. GUIDELINES ACCORDING TO TYPE OF KIDNEY STONE Calcium Oxalate Kidney Stones  Reduce the amount of salt you eat. Foods that have a lot of salt cause your body to release excess calcium into your urine. The excess calcium can combine with a substance called oxalate to form kidney stones.  Reduce the amount of animal protein you eat if the amount you eat is excessive. Animal protein causes your body to release excess calcium into your urine. Ask your dietitian how much protein from animal sources you should be eating.  Avoid foods that are high in oxalates. If you take vitamins, they should have less than 500 mg of vitamin C. Your body turns vitamin C into oxalates. You do not need to avoid fruits and vegetables high in vitamin C. Calcium Phosphate Kidney Stones  Reduce the amount of salt you eat to help prevent the release of excess calcium into your urine.  Reduce the amount of animal protein you eat if the amount you eat is excessive. Animal protein causes your body to release excess calcium into your urine. Ask your dietitian how much protein from animal sources you should be eating.  Get enough calcium from food or take a calcium supplement (ask your dietitian for recommendations). Food sources of calcium that do not increase your risk of kidney stones include:  Broccoli.  Dairy products, such as cheese and yogurt.  Pudding. Uric Acid Kidney Stones  Do not have more than 6 oz of animal protein per day. FOOD SOURCES Animal Protein Sources  Meat (all types).  Poultry.  Eggs.  Fish, seafood. Foods High in Salt  Salt seasonings.  Soy  sauce.  Teriyaki sauce.  Cured and processed meats.  Salted crackers and snack foods.  Fast food.  Canned soups and most canned foods. Foods High in Oxalates  Grains:  Amaranth.  Barley.  Grits.  Wheat germ.  Bran.  Buckwheat flour.  All bran cereals.  Pretzels.  Whole wheat bread.  Vegetables:  Beans (wax).  Beets and beet greens.  Collard greens.  Eggplant.  [place].  Leeks.  Okra.  Parsley.  R[place]abagas.  Spinach.  Swiss chard.  Tomato paste.  Fried potatoes.  Sweet potatoes.  Fruits:  Red currants.  Figs.  Kiwi.  Rhubarb.  Meat and Other Protein Sources:  Beans (dried).  Soy burgers and other soybean products.  Miso.  Nuts (peanuts, almonds, pecans, cashews, hazelnuts).  Nut butters.  Sesame seeds and tahini (paste made of sesame seeds).  Poppy seeds.  Beverages:  Chocolate drink mixes.  Soy milk.  Instant iced tea.  Juices made from high-oxalate fruits or vegetables.  Other:  Carob.  Chocolate.  Fruitcake.  Marmalades.   This information is not intended to replace advice given to you by your health care provider. Make sure you discuss any questions you have with your health care provider.   Document Released: 04/02/2011 Document Revised: 12/11/2013 Document Reviewed: 11/02/2013 Elsevier Interactive Patient Education 2016 [employer]  

## 2016-10-15 NOTE — Telephone Encounter (Signed)
Patient notified and appt made. Please put the order in for the KUB when you get a chance.  Thanks,  Sharyn Lull

## 2016-10-15 NOTE — Telephone Encounter (Signed)
Will you arrange this and let pt know?

## 2016-11-04 ENCOUNTER — Encounter: Payer: Self-pay | Admitting: Urology

## 2016-11-04 ENCOUNTER — Ambulatory Visit
Admission: RE | Admit: 2016-11-04 | Discharge: 2016-11-04 | Disposition: A | Discharge status: Home / Self Care | Payer: Medicare Other | Source: Ambulatory Visit | Attending: Urology | Admitting: Urology

## 2016-11-04 ENCOUNTER — Ambulatory Visit (INDEPENDENT_AMBULATORY_CARE_PROVIDER_SITE_OTHER): Payer: Medicare Other | Admitting: Urology

## 2016-11-04 VITALS — BP 139/79 | HR 76 | Ht 72.0 in | Wt 203.9 lb

## 2016-11-04 DIAGNOSIS — N200 Calculus of kidney: Secondary | ICD-10-CM | POA: Insufficient documentation

## 2016-11-04 DIAGNOSIS — N201 Calculus of ureter: Secondary | ICD-10-CM

## 2016-11-04 DIAGNOSIS — N132 Hydronephrosis with renal and ureteral calculous obstruction: Secondary | ICD-10-CM | POA: Diagnosis not present

## 2016-11-04 DIAGNOSIS — N183 Chronic kidney disease, stage 3 unspecified: Secondary | ICD-10-CM

## 2016-11-04 DIAGNOSIS — K8020 Calculus of gallbladder without cholecystitis without obstruction: Secondary | ICD-10-CM | POA: Diagnosis not present

## 2016-11-04 NOTE — Progress Notes (Signed)
11/04/2016 4:17 PM   Kristopher Fernandez Kristopher Fernandez Feb 27, 1932 [id number]  Referring provider: Glendon Axe, MD [place] [facility] [place], [place] [address]  Chief Complaint  Patient presents with  . Nephrolithiasis    HPI: 82 year old male who with 4 mm right distal ureteral stone for 2 week follow up after MET. KUB today shows stone in unchanged position.  Denies any fevers, pain, or voiding symptoms.  He was initially evaluated after emergency room visit on 10/08/2016 with acute onset abdominal pain radiating to his back with associated vomiting. He underwent a CT angio of the chest abdomen and pelvis to rule out aortic dissection at which time a 4 mm right mid ureteral stone with proximal hydronephrosis was appreciated.  UA was unremarkable.  Urine culture grew mixed flora consistent with contamination.  No leukocytosis.  Incidentally, he does have bilateral renal cysts which appear to be simple.  He does have CKD, stage III, most recent creatinine 1.32 which is around his baseline.  No previous history of stones.   PMH: Past Medical History:  Diagnosis Date  . Anginal pain ([place])   . Aortic stenosis 07/03/2014   Overview:  Mild with calculated aortic valve area of 1.05cm2  . Basal cell carcinoma, ear 03/31/2015  . Bradycardia 07/03/2014  . DDD (degenerative disc disease), lumbar 08/06/2015  . H/O cardiac catheterization 07/03/2014   Overview:  Cypher stent proximal and distal RCA 07/18/05 and TAXUS stent mid LAD 07/20/05 at [facility]  . HTN (hypertension) 07/03/2014  . Hyperlipidemia 07/03/2014  . Lumbosacral radiculopathy at S1 08/22/2015  . MI (myocardial infarction) 07/03/2014   Overview:  Mi 07/17/05  . Myocardial infarction   . Normocytic normochromic anemia 09/14/2015  . Pacemaker 07/03/2014   Overview:  Dual chamber pacemaker 08/11/11  . Shortness of breath dyspnea   . SOB (shortness of breath) on exertion 05/13/2016    Surgical History: Past Surgical History:    Procedure Laterality Date  . CARDIAC CATHETERIZATION Bilateral 07/14/2016   Procedure: Right/Left Heart Cath and Coronary Angiography;  Surgeon: Isaias Cowman, MD;  Location: [place] [facility];  Service: Cardiovascular;  Laterality: Bilateral;  . CORONARY ANGIOPLASTY    . HERNIA REPAIR      Home Medications:    Medication List       Accurate as of 11/04/16  4:17 PM. Always use your most recent med list.          aspirin EC 81 MG tablet Take 81 mg by mouth daily.   atorvastatin 80 MG tablet Commonly known as:  LIPITOR Take 80 mg by mouth daily.   clopidogrel 75 MG tablet Commonly known as:  PLAVIX Take 75 mg by mouth daily.   metoprolol succinate 25 MG 24 hr tablet Commonly known as:  TOPROL-XL Take 25 mg by mouth daily.   ramipril 5 MG capsule Commonly known as:  ALTACE Take 5 mg by mouth daily.       Allergies: No Known Allergies  Family History: Family History  Problem Relation Age of Onset  . Bladder Cancer Neg Hx   . Kidney cancer Neg Hx   . Prostate cancer Neg Hx     Social History:  reports that he has quit smoking. His smoking use included Cigarettes. He quit after 10.00 years of use. He has quit using smokeless tobacco. He reports that he does not drink alcohol or use drugs.  ROS: UROLOGY Frequent Urination?: No Hard to postpone urination?: No Burning/pain with urination?: No Get up  at night to urinate?: No Leakage of urine?: No Urine stream starts and stops?: No Trouble starting stream?: No Do you have to strain to urinate?: No Blood in urine?: No Urinary tract infection?: No Sexually transmitted disease?: No Injury to kidneys or bladder?: No Painful intercourse?: No Weak stream?: No Erection problems?: No Penile pain?: No  Gastrointestinal Nausea?: No Vomiting?: No Indigestion/heartburn?: No Diarrhea?: No Constipation?: No  Constitutional Fever: No Night sweats?: No Weight loss?: No Fatigue?: No  Skin Skin  rash/lesions?: No Itching?: No  Eyes Blurred vision?: No Double vision?: No  Ears/Nose/Throat Sore throat?: No Sinus problems?: No  Hematologic/Lymphatic Swollen glands?: No Easy bruising?: No  Cardiovascular Leg swelling?: No Chest pain?: No  Respiratory Cough?: No Shortness of breath?: No  Endocrine Excessive thirst?: No  Musculoskeletal Back pain?: No Joint pain?: No  Neurological Headaches?: No Dizziness?: No  Psychologic Depression?: No Anxiety?: No  Physical Exam: BP 139/79 (BP Location: Left Arm, Patient Position: Sitting, Cuff Size: Normal)   Pulse 76   Ht 6' (1.829 m)   Wt 203 lb 14.4 oz (92.5 kg)   BMI 27.65 kg/m   Constitutional:  Alert and oriented, No acute distress.  Accompanied by wife today. HEENT: [place] AT, moist mucus membranes.  Trachea midline, no masses. Cardiovascular: No clubbing, cyanosis.  1+ bilateral LE edema. Respiratory: Normal respiratory effort, no increased work of breathing. GI: Abdomen is soft, nontender, nondistended, no abdominal masses GU: No CVA tenderness.  Skin: No rashes, bruises or suspicious lesions. Neurologic: Grossly intact, no focal deficits, moving all 4 extremities. Psychiatric: Normal mood and affect.  Laboratory Data: Lab Results  Component Value Date   WBC 8.7 10/08/2016   HGB 14.2 10/08/2016   HCT 40.4 10/08/2016   MCV 86.1 10/08/2016   PLT 123 (L) 10/08/2016    Lab Results  Component Value Date   CREATININE 1.32 (H) 10/08/2016   Urinalysis Component     Latest Ref Rng & Units 10/08/2016  Color, Urine     YELLOW YELLOW (A)  Appearance     CLEAR CLEAR (A)  Glucose     NEGATIVE mg/dL NEGATIVE  Bilirubin Urine     NEGATIVE NEGATIVE  Ketones, ur     NEGATIVE mg/dL NEGATIVE  Specific Gravity, Urine     1.005 - 1.030 1.036 (H)  Hgb urine dipstick     NEGATIVE 2+ (A)  pH     5.0 - 8.0 6.0  Protein     NEGATIVE mg/dL NEGATIVE  Nitrite     NEGATIVE NEGATIVE  Leukocytes, UA      NEGATIVE TRACE (A)  RBC / HPF     0 - 5 RBC/hpf 0-5  WBC, UA     0 - 5 WBC/hpf 0-5  Bacteria, UA     NONE SEEN NONE SEEN  Squamous Epithelial / LPF     NONE SEEN NONE SEEN  Mucous      PRESENT   Results for orders placed or performed in visit on 10/15/16  Microscopic Examination  Result Value Ref Range   WBC, UA 6-10 (A) 0 - 5 /hpf   RBC, UA 0-2 0 - 2 /hpf   Epithelial Cells (non renal) 0-10 0 - 10 /hpf   Bacteria, UA Few None seen/Few  Urinalysis, Complete  Result Value Ref Range   Specific Gravity, UA 1.025 1.005 - 1.030   pH, UA 5.0 5.0 - 7.5   Color, UA Yellow Yellow   Appearance Ur Clear Clear   Leukocytes,  UA Trace (A) Negative   Protein, UA Negative Negative/Trace   Glucose, UA Negative Negative   Ketones, UA Negative Negative   RBC, UA Trace (A) Negative   Bilirubin, UA Negative Negative   Urobilinogen, Ur 0.2 0.2 - 1.0 mg/dL   Nitrite, UA Negative Negative   Microscopic Examination See below:     Pertinent Imaging: CLINICAL DATA:  Recheck for right kidney stone.  EXAM: ABDOMEN - 1 VIEW  COMPARISON:  10/15/2016 x-ray.  10/08/2016 CT scan.  FINDINGS: Spine view shows a nonspecific bowel gas pattern. Gallstones are evident medial right upper quadrant. No stone is visualized at the level of the right iliac crest is seen on the previous CT scan. Multiple phleboliths Sir overlying the anatomic pelvis bilaterally, right greater than left. These phleboliths could obscure a distal right ureteral stone.  IMPRESSION: Stable x-ray since 10/15/2016.   Electronically Signed   By: Misty Stanley M.D.   On: 11/04/2016 10:04  KUB today personally reviewed today along with previous KUB and previous CT angiogram  Assessment & Plan:    1. Right ureteral stone/ right hydronephrosis KUB with 4 mm fragment which appears to possibly be within the ureter which may represent a retained stone Given that he is asymptomatic at this time, will obtain a renal  ultrasound to assess for possible resolution of hydronephrosis If there is persistent hydro, would recommend surgical intervention given that the stone has failed to pass and greater than 30 days We discussed various treatment options including ESWL vs. ureteroscopy, laser lithotripsy, and stent. We discussed the risks and benefits of both including bleeding, infection, damage to surrounding structures, efficacy with need for possible further intervention, and need for temporary ureteral stent. He is most anxious said and shockwave lithotripsy. He understands that there is a higher failure rate for this procedure with distal ureteral stones. He will need cardiac clearance from Dr. Saralyn Pilar to stop his aspirin and Plavix If this is not possible, we will need to proceed with ureteroscopy - DG Abd 1 View -RUS  2. Stage 3 chronic kidney disease Renal function stable   Hollice Espy, MD  [place] [address], [place], [place] [address] [phone]  I spent 25 min with this patient of which greater than 50% was spent in counseling and coordination of care with the patient.   Case discussed with Dr. Saralyn Pilar' office

## 2016-11-10 ENCOUNTER — Ambulatory Visit
Admission: RE | Admit: 2016-11-10 | Discharge: 2016-11-10 | Disposition: A | Discharge status: Home / Self Care | Payer: Medicare Other | Source: Ambulatory Visit | Attending: Urology | Admitting: Urology

## 2016-11-10 DIAGNOSIS — N201 Calculus of ureter: Secondary | ICD-10-CM | POA: Diagnosis present

## 2016-11-10 DIAGNOSIS — N281 Cyst of kidney, acquired: Secondary | ICD-10-CM | POA: Insufficient documentation

## 2016-11-10 DIAGNOSIS — N400 Enlarged prostate without lower urinary tract symptoms: Secondary | ICD-10-CM | POA: Diagnosis not present

## 2016-11-10 DIAGNOSIS — N200 Calculus of kidney: Secondary | ICD-10-CM | POA: Insufficient documentation

## 2016-11-17 ENCOUNTER — Telehealth: Payer: Self-pay

## 2016-11-17 DIAGNOSIS — N200 Calculus of kidney: Secondary | ICD-10-CM

## 2016-11-17 NOTE — Telephone Encounter (Signed)
Spoke with pt in reference to RUS results. Per Dr. Jasmine Awe looked great, no swelling of kidney, no surgery, and KUB in 25mo. Pt voiced understanding.

## 2017-02-03 DIAGNOSIS — N183 Chronic kidney disease, stage 3 unspecified: Secondary | ICD-10-CM | POA: Insufficient documentation

## 2017-12-01 ENCOUNTER — Ambulatory Visit
Admission: RE | Admit: 2017-12-01 | Discharge: 2017-12-01 | Disposition: A | Discharge status: Home / Self Care | Payer: Medicare Other | Source: Ambulatory Visit | Attending: Family Medicine | Admitting: Family Medicine

## 2017-12-01 ENCOUNTER — Other Ambulatory Visit: Payer: Self-pay | Admitting: Family Medicine

## 2017-12-01 DIAGNOSIS — J90 Pleural effusion, not elsewhere classified: Secondary | ICD-10-CM | POA: Diagnosis not present

## 2017-12-01 DIAGNOSIS — R059 Cough, unspecified: Secondary | ICD-10-CM

## 2017-12-01 DIAGNOSIS — J984 Other disorders of lung: Secondary | ICD-10-CM | POA: Diagnosis not present

## 2017-12-01 DIAGNOSIS — R05 Cough: Secondary | ICD-10-CM | POA: Insufficient documentation

## 2017-12-01 DIAGNOSIS — I700 Atherosclerosis of aorta: Secondary | ICD-10-CM | POA: Diagnosis not present

## 2017-12-16 ENCOUNTER — Other Ambulatory Visit: Payer: Self-pay | Admitting: Internal Medicine

## 2017-12-16 DIAGNOSIS — R509 Fever, unspecified: Secondary | ICD-10-CM

## 2017-12-16 DIAGNOSIS — R059 Cough, unspecified: Secondary | ICD-10-CM

## 2017-12-16 DIAGNOSIS — Z1630 Resistance to unspecified antimicrobial drugs: Secondary | ICD-10-CM

## 2017-12-16 DIAGNOSIS — R05 Cough: Secondary | ICD-10-CM

## 2017-12-21 ENCOUNTER — Ambulatory Visit
Admission: RE | Admit: 2017-12-21 | Discharge: 2017-12-21 | Disposition: A | Discharge status: Home / Self Care | Payer: Medicare Other | Source: Ambulatory Visit | Attending: Internal Medicine | Admitting: Internal Medicine

## 2017-12-21 DIAGNOSIS — I2510 Atherosclerotic heart disease of native coronary artery without angina pectoris: Secondary | ICD-10-CM | POA: Diagnosis not present

## 2017-12-21 DIAGNOSIS — R059 Cough, unspecified: Secondary | ICD-10-CM

## 2017-12-21 DIAGNOSIS — I712 Thoracic aortic aneurysm, without rupture: Secondary | ICD-10-CM | POA: Insufficient documentation

## 2017-12-21 DIAGNOSIS — R05 Cough: Secondary | ICD-10-CM | POA: Insufficient documentation

## 2017-12-21 DIAGNOSIS — I350 Nonrheumatic aortic (valve) stenosis: Secondary | ICD-10-CM | POA: Diagnosis not present

## 2017-12-21 DIAGNOSIS — Z1630 Resistance to unspecified antimicrobial drugs: Secondary | ICD-10-CM | POA: Diagnosis present

## 2017-12-21 DIAGNOSIS — R509 Fever, unspecified: Secondary | ICD-10-CM | POA: Insufficient documentation

## 2017-12-21 DIAGNOSIS — R918 Other nonspecific abnormal finding of lung field: Secondary | ICD-10-CM | POA: Diagnosis not present

## 2018-02-09 ENCOUNTER — Encounter (INDEPENDENT_AMBULATORY_CARE_PROVIDER_SITE_OTHER): Payer: Self-pay | Admitting: Vascular Surgery

## 2018-02-09 ENCOUNTER — Ambulatory Visit (INDEPENDENT_AMBULATORY_CARE_PROVIDER_SITE_OTHER): Payer: Medicare Other | Admitting: Vascular Surgery

## 2018-02-09 VITALS — BP 138/73 | HR 72 | Resp 16 | Ht 72.0 in | Wt 209.0 lb

## 2018-02-09 DIAGNOSIS — I10 Essential (primary) hypertension: Secondary | ICD-10-CM

## 2018-02-09 DIAGNOSIS — M5136 Other intervertebral disc degeneration, lumbar region: Secondary | ICD-10-CM

## 2018-02-09 DIAGNOSIS — M51369 Other intervertebral disc degeneration, lumbar region without mention of lumbar back pain or lower extremity pain: Secondary | ICD-10-CM

## 2018-02-09 DIAGNOSIS — I712 Thoracic aortic aneurysm, without rupture, unspecified: Secondary | ICD-10-CM | POA: Insufficient documentation

## 2018-02-09 DIAGNOSIS — I7121 Aneurysm of the ascending aorta, without rupture: Secondary | ICD-10-CM

## 2018-02-09 DIAGNOSIS — E782 Mixed hyperlipidemia: Secondary | ICD-10-CM | POA: Diagnosis not present

## 2018-02-09 NOTE — Progress Notes (Signed)
MRN : [id number]  Kristopher Fernandez is a 84 y.o. ([birth year]) male who presents with chief complaint of No chief complaint on file. Marland Kitchen  History of Present Illness: The patient presents to the office for evaluation of an  Thoracic aortic aneurysm. The aneurysm was found incidentally by CT scan obtained for a cough and low grade fever.  Patient denies abdominal pain or unusual chest pain, back pain, no other abdominal/ chest complaints.  No history of an acute onset of painful blue discoloration of the toes.     No family history of TAA/AAA.   Patient denies amaurosis fugax or TIA symptoms. There is no history of claudication or rest pain symptoms of the lower extremities.  The patient denies angina or shortness of breath.  CT scan shows an TAA that measures 4.2 cm  No outpatient medications have been marked as taking for the 02/09/18 encounter (Appointment) with Delana Meyer, Dolores Lory, MD.    Past Medical History:  Diagnosis Date  . Anginal pain ([place])   . Aortic stenosis 07/03/2014   Overview:  Mild with calculated aortic valve area of 1.05cm2  . Basal cell carcinoma, ear 03/31/2015  . Bradycardia 07/03/2014  . DDD (degenerative disc disease), lumbar 08/06/2015  . H/O cardiac catheterization 07/03/2014   Overview:  Cypher stent proximal and distal RCA 07/18/05 and TAXUS stent mid LAD 07/20/05 at [facility]  . HTN (hypertension) 07/03/2014  . Hyperlipidemia 07/03/2014  . Lumbosacral radiculopathy at S1 08/22/2015  . MI (myocardial infarction) 07/03/2014   Overview:  Mi 07/17/05  . Myocardial infarction   . Normocytic normochromic anemia 09/14/2015  . Pacemaker 07/03/2014   Overview:  Dual chamber pacemaker 08/11/11  . Shortness of breath dyspnea   . SOB (shortness of breath) on exertion 05/13/2016    Past Surgical History:  Procedure Laterality Date  . CARDIAC CATHETERIZATION Bilateral 07/14/2016   Procedure: Right/Left Heart Cath and Coronary Angiography;  Surgeon: Isaias Cowman, MD;  Location:  [place] [facility];  Service: Cardiovascular;  Laterality: Bilateral;  . CORONARY ANGIOPLASTY    . HERNIA REPAIR      Social History Social History   Tobacco Use  . Smoking status: Former Smoker    Years: 10.00    Types: Cigarettes  . Smokeless tobacco: Former Network engineer Use Topics  . Alcohol use: No  . Drug use: No    Family History Family History  Problem Relation Age of Onset  . Bladder Cancer Neg Hx   . Kidney cancer Neg Hx   . Prostate cancer Neg Hx   No family history of bleeding/clotting disorders, porphyria or autoimmune disease   No Known Allergies   REVIEW OF SYSTEMS (Negative unless checked)  Constitutional: [] Weight loss  [] Fever  [] Chills Cardiac: [] Chest pain   [] Chest pressure   [] Palpitations   [] Shortness of breath when laying flat   [] Shortness of breath with exertion. Vascular:  [] Pain in legs with walking   [] Pain in legs at rest  [] History of DVT   [] Phlebitis   [] Swelling in legs   [] Varicose veins   [] Non-healing ulcers Pulmonary:   [] Uses home oxygen   [] Productive cough   [] Hemoptysis   [] Wheeze  [] COPD   [] Asthma Neurologic:  [] Dizziness   [] Seizures   [] History of stroke   [] History of TIA  [] Aphasia   [] Vissual changes   [] Weakness or numbness in arm   [] Weakness or numbness in leg Musculoskeletal:   [] Joint swelling   [] Joint pain   []   Low back pain Hematologic:  [] Easy bruising  [] Easy bleeding   [] Hypercoagulable state   [] Anemic Gastrointestinal:  [] Diarrhea   [] Vomiting  [] Gastroesophageal reflux/heartburn   [] Difficulty swallowing. Genitourinary:  [] Chronic kidney disease   [] Difficult urination  [] Frequent urination   [] Blood in urine Skin:  [] Rashes   [] Ulcers  Psychological:  [] History of anxiety   []  History of major depression.  Physical Examination  There were no vitals filed for this visit. There is no height or weight on file to calculate BMI. Gen: WD/WN, NAD Head: [place]/AT, No temporalis wasting.  Ear/Nose/Throat: Hearing  grossly intact, nares w/o erythema or drainage, poor dentition Eyes: PER, EOMI, sclera nonicteric.  Neck: Supple, no masses.  No bruit or JVD.  Pulmonary:  Good air movement, clear to auscultation bilaterally, no use of accessory muscles.  Cardiac: RRR, normal S1, S2, no Murmurs. Vascular:  Vessel Right Left  Radial Palpable Palpable  Popliteal Not enlarged Palpable Not enlarged Palpable  PT Palpable Palpable  DP Palpable Palpable   Gastrointestinal: soft, non-distended. No guarding/no peritoneal signs.  Musculoskeletal: M/S 5/5 throughout.  No deformity or atrophy.  Neurologic: CN 2-12 intact. Pain and light touch intact in extremities.  Symmetrical.  Speech is fluent. Motor exam as listed above. Psychiatric: Judgment intact, Mood & affect appropriate for pt's clinical situation. Dermatologic: No rashes or ulcers noted.  No changes consistent with cellulitis. Lymph : No Cervical lymphadenopathy, no lichenification or skin changes of chronic lymphedema.  CBC Lab Results  Component Value Date   WBC 8.7 10/08/2016   HGB 14.2 10/08/2016   HCT 40.4 10/08/2016   MCV 86.1 10/08/2016   PLT 123 (L) 10/08/2016    BMET    Component Value Date/Time   NA 139 10/08/2016 1106   NA 137 03/28/2014 0404   K 4.0 10/08/2016 1106   K 4.2 03/28/2014 0404   CL 106 10/08/2016 1106   CL 104 03/28/2014 0404   CO2 27 10/08/2016 1106   CO2 30 03/28/2014 0404   GLUCOSE 125 (H) 10/08/2016 1106   GLUCOSE 105 (H) 03/28/2014 0404   BUN 20 10/08/2016 1106   BUN 16 03/28/2014 0404   CREATININE 1.32 (H) 10/08/2016 1106   CREATININE 1.29 03/28/2014 0404   CALCIUM 9.4 10/08/2016 1106   CALCIUM 8.8 03/28/2014 0404   GFRNONAA 49 (L) 10/08/2016 1106   GFRNONAA 52 (L) 03/28/2014 0404   GFRAA 56 (L) 10/08/2016 1106   GFRAA >60 03/28/2014 0404   CrCl cannot be calculated (Patient's most recent lab result is older than the maximum 21 days allowed.).  COAG Lab Results  Component Value Date   INR 1.07  07/14/2016    Radiology No results found.  Assessment/Plan 1. Ascending aortic aneurysm (HCC) No surgery or intervention at this time.  The patient has an asymptomatic thoracic aortic aneurysm that is less than 5 cm in maximal diameter.  I have discussed the natural history of an aortic aneurysm and the small risk of rupture for aneurysm less than 6.0 cm in size.  However, as these small aneurysms tend to enlarge over time, continued surveillance with  CT scan is mandatory.   I have also discussed optimizing medical management with hypertension and lipid control and the importance of abstinence from tobacco.  The patient is also encouraged to exercise a minimum of 30 minutes 4 times a week.   Should the patient develop new onset chest or back pain or signs of peripheral embolization they are instructed to seek medical attention immediately  and to alert the physician providing care that they have an aneurysm.  The patient voices their understanding. The patient will return in 18 months with an aortic duplex.   2. Essential hypertension Continue antihypertensive medications as already ordered, these medications have been reviewed and there are no changes at this time.   3. DDD (degenerative disc disease), lumbar Continue NSAID medications as already ordered, these medications have been reviewed and there are no changes at this time.  Continued activity and therapy was stressed.   4. Mixed hyperlipidemia Continue statin as ordered and reviewed, no changes at this time     Hortencia Pilar, MD  02/09/2018 1:05 PM

## 2018-12-26 ENCOUNTER — Emergency Department: Payer: Medicare Other

## 2018-12-26 ENCOUNTER — Encounter: Payer: Self-pay | Admitting: Emergency Medicine

## 2018-12-26 ENCOUNTER — Observation Stay
Admission: EM | Admit: 2018-12-26 | Discharge: 2018-12-28 | Disposition: A | Discharge status: Home / Self Care | Payer: Medicare Other | Attending: Family Medicine | Admitting: Family Medicine

## 2018-12-26 ENCOUNTER — Other Ambulatory Visit: Payer: Self-pay

## 2018-12-26 DIAGNOSIS — D649 Anemia, unspecified: Secondary | ICD-10-CM | POA: Insufficient documentation

## 2018-12-26 DIAGNOSIS — R0600 Dyspnea, unspecified: Secondary | ICD-10-CM | POA: Diagnosis not present

## 2018-12-26 DIAGNOSIS — E785 Hyperlipidemia, unspecified: Secondary | ICD-10-CM | POA: Diagnosis not present

## 2018-12-26 DIAGNOSIS — Z79899 Other long term (current) drug therapy: Secondary | ICD-10-CM | POA: Diagnosis not present

## 2018-12-26 DIAGNOSIS — R0789 Other chest pain: Secondary | ICD-10-CM | POA: Diagnosis not present

## 2018-12-26 DIAGNOSIS — R079 Chest pain, unspecified: Secondary | ICD-10-CM | POA: Diagnosis present

## 2018-12-26 DIAGNOSIS — Z87891 Personal history of nicotine dependence: Secondary | ICD-10-CM | POA: Diagnosis not present

## 2018-12-26 DIAGNOSIS — Z955 Presence of coronary angioplasty implant and graft: Secondary | ICD-10-CM | POA: Diagnosis not present

## 2018-12-26 DIAGNOSIS — I10 Essential (primary) hypertension: Secondary | ICD-10-CM | POA: Insufficient documentation

## 2018-12-26 DIAGNOSIS — I200 Unstable angina: Secondary | ICD-10-CM

## 2018-12-26 DIAGNOSIS — M5136 Other intervertebral disc degeneration, lumbar region: Secondary | ICD-10-CM | POA: Diagnosis not present

## 2018-12-26 DIAGNOSIS — I350 Nonrheumatic aortic (valve) stenosis: Secondary | ICD-10-CM | POA: Insufficient documentation

## 2018-12-26 DIAGNOSIS — I252 Old myocardial infarction: Secondary | ICD-10-CM | POA: Diagnosis not present

## 2018-12-26 DIAGNOSIS — Z85828 Personal history of other malignant neoplasm of skin: Secondary | ICD-10-CM | POA: Diagnosis not present

## 2018-12-26 DIAGNOSIS — M5417 Radiculopathy, lumbosacral region: Secondary | ICD-10-CM | POA: Diagnosis not present

## 2018-12-26 DIAGNOSIS — R0602 Shortness of breath: Secondary | ICD-10-CM | POA: Diagnosis present

## 2018-12-26 DIAGNOSIS — Z7982 Long term (current) use of aspirin: Secondary | ICD-10-CM | POA: Diagnosis not present

## 2018-12-26 LAB — CBC
HCT: 45.8 % (ref 39.0–52.0)
Hemoglobin: 15.1 g/dL (ref 13.0–17.0)
MCH: 29 pg (ref 26.0–34.0)
MCHC: 33 g/dL (ref 30.0–36.0)
MCV: 88.1 fL (ref 80.0–100.0)
Platelets: 145 10*3/uL — ABNORMAL LOW (ref 150–400)
RBC: 5.2 MIL/uL (ref 4.22–5.81)
RDW: 12.4 % (ref 11.5–15.5)
WBC: 5.9 10*3/uL (ref 4.0–10.5)
nRBC: 0 % (ref 0.0–0.2)

## 2018-12-26 LAB — TROPONIN I
Troponin I: <0.03 ng/mL (ref <0.03)
Troponin I: <0.03 ng/mL (ref <0.03)

## 2018-12-26 LAB — BASIC METABOLIC PANEL
Anion gap: 6 (ref 5–15)
BUN: 14 mg/dL (ref 8–23)
CHLORIDE: 102 mmol/L (ref 98–111)
CO2: 29 mmol/L (ref 22–32)
CREATININE: 1.31 mg/dL — AB (ref 0.61–1.24)
Calcium: 9.3 mg/dL (ref 8.9–10.3)
GFR calc Af Amer: 58 mL/min — ABNORMAL LOW (ref >60)
GFR calc non Af Amer: 50 mL/min — ABNORMAL LOW (ref >60)
GLUCOSE: 109 mg/dL — AB (ref 70–99)
Potassium: 4 mmol/L (ref 3.5–5.1)
SODIUM: 137 mmol/L (ref 135–145)

## 2018-12-26 MED ORDER — ISOSORBIDE MONONITRATE ER 30 MG PO TB24
30.0000 mg | ORAL_TABLET | Freq: Every day | ORAL | Status: DC
  Administered 2018-12-26 – 2018-12-28 (×3): 30 mg via ORAL
  Filled 2018-12-26 (×3): qty 1

## 2018-12-26 MED ORDER — NITROGLYCERIN 0.4 MG SL SUBL: 0.4000 mg | SUBLINGUAL_TABLET | SUBLINGUAL | Status: DC | PRN

## 2018-12-26 MED ORDER — ASPIRIN 81 MG PO CHEW
324.0000 mg | CHEWABLE_TABLET | Freq: Once | ORAL | Status: AC
  Administered 2018-12-26: 324 mg via ORAL
  Filled 2018-12-26: qty 4

## 2018-12-26 MED ORDER — SODIUM CHLORIDE 0.9 % IV SOLN: 250.0000 mL | INTRAVENOUS | Status: DC | PRN

## 2018-12-26 MED ORDER — CLOPIDOGREL BISULFATE 75 MG PO TABS
75.0000 mg | ORAL_TABLET | Freq: Every day | ORAL | Status: DC
  Administered 2018-12-26 – 2018-12-28 (×3): 75 mg via ORAL
  Filled 2018-12-26 (×3): qty 1

## 2018-12-26 MED ORDER — ALBUTEROL SULFATE (2.5 MG/3ML) 0.083% IN NEBU: 2.5000 mg | INHALATION_SOLUTION | Freq: Four times a day (QID) | RESPIRATORY_TRACT | Status: DC | PRN

## 2018-12-26 MED ORDER — RAMIPRIL 5 MG PO CAPS
5.0000 mg | ORAL_CAPSULE | Freq: Every day | ORAL | Status: DC
  Administered 2018-12-27 – 2018-12-28 (×2): 5 mg via ORAL
  Filled 2018-12-26 (×2): qty 1

## 2018-12-26 MED ORDER — ASPIRIN EC 81 MG PO TBEC
81.0000 mg | DELAYED_RELEASE_TABLET | Freq: Every day | ORAL | Status: DC
  Administered 2018-12-27 – 2018-12-28 (×2): 81 mg via ORAL
  Filled 2018-12-26 (×2): qty 1

## 2018-12-26 MED ORDER — ENOXAPARIN SODIUM 40 MG/0.4ML ~~LOC~~ SOLN
40.0000 mg | SUBCUTANEOUS | Status: DC
  Administered 2018-12-26 – 2018-12-27 (×2): 40 mg via SUBCUTANEOUS
  Filled 2018-12-26 (×2): qty 0.4

## 2018-12-26 MED ORDER — RAMIPRIL 5 MG PO CAPS
5.0000 mg | ORAL_CAPSULE | Freq: Once | ORAL | Status: AC
  Administered 2018-12-26: 5 mg via ORAL
  Filled 2018-12-26: qty 1

## 2018-12-26 MED ORDER — ACETAMINOPHEN 325 MG PO TABS: 650.0000 mg | ORAL_TABLET | ORAL | Status: DC | PRN

## 2018-12-26 MED ORDER — SODIUM CHLORIDE 0.9% FLUSH
3.0000 mL | Freq: Two times a day (BID) | INTRAVENOUS | Status: DC
  Administered 2018-12-26 – 2018-12-28 (×4): 3 mL via INTRAVENOUS

## 2018-12-26 MED ORDER — ONDANSETRON HCL 4 MG/2ML IJ SOLN: 4.0000 mg | Freq: Four times a day (QID) | INTRAMUSCULAR | Status: DC | PRN

## 2018-12-26 MED ORDER — ASPIRIN EC 81 MG PO TBEC: 81.0000 mg | DELAYED_RELEASE_TABLET | Freq: Every day | ORAL | Status: DC

## 2018-12-26 MED ORDER — RAMIPRIL 5 MG PO CAPS: 5.0000 mg | ORAL_CAPSULE | Freq: Every day | ORAL | Status: DC

## 2018-12-26 MED ORDER — NITROGLYCERIN 0.4 MG SL SUBL
0.4000 mg | SUBLINGUAL_TABLET | SUBLINGUAL | Status: DC | PRN
  Administered 2018-12-26 (×4): 0.4 mg via SUBLINGUAL
  Filled 2018-12-26 (×3): qty 1

## 2018-12-26 MED ORDER — SODIUM CHLORIDE 0.9% FLUSH: 3.0000 mL | INTRAVENOUS | Status: DC | PRN

## 2018-12-26 MED ORDER — ASPIRIN 81 MG PO CHEW: 324.0000 mg | CHEWABLE_TABLET | ORAL | Status: DC

## 2018-12-26 MED ORDER — ASPIRIN 300 MG RE SUPP: 300.0000 mg | RECTAL | Status: DC

## 2018-12-26 MED ORDER — MOMETASONE FURO-FORMOTEROL FUM 200-5 MCG/ACT IN AERO
2.0000 | INHALATION_SPRAY | Freq: Two times a day (BID) | RESPIRATORY_TRACT | Status: DC
  Administered 2018-12-26 – 2018-12-28 (×4): 2 via RESPIRATORY_TRACT
  Filled 2018-12-26: qty 8.8

## 2018-12-26 MED ORDER — ATORVASTATIN CALCIUM 20 MG PO TABS
80.0000 mg | ORAL_TABLET | Freq: Every day | ORAL | Status: DC
  Administered 2018-12-26 – 2018-12-28 (×3): 80 mg via ORAL
  Filled 2018-12-26 (×3): qty 4

## 2018-12-26 NOTE — H&P (Signed)
[facility] [place] NAME: Kristopher Fernandez    MR#:  [id number]  DATE OF BIRTH:  [birth year]  DATE OF ADMISSION:  12/26/2018  PRIMARY CARE PHYSICIAN: Glendon Axe, MD   REQUESTING/REFERRING PHYSICIAN:   CHIEF COMPLAINT:   Chief Complaint  Patient presents with  . Chest Pain  . Shortness of Breath    HISTORY OF PRESENT ILLNESS: Kristopher Fernandez  is a 85 y.o. male with a known history of aortic stenosis, basal cell cancer, pacemaker, hypertension, hyperlipidemia, myocardial infarction in the past presented to the emergency room for chest pain.  Patient follows up with [facility] clinic cardiology.  Chest pain started yesterday evening located in left-sided chest sharp in nature 5 out of 10 on a scale of 1-10.  First set of troponin has been negative.  Hospitalist service was consulted.  Had an episode of shortness of breath.  PAST MEDICAL HISTORY:   Past Medical History:  Diagnosis Date  . Anginal pain ([place])   . Aortic stenosis 07/03/2014   Overview:  Mild with calculated aortic valve area of 1.05cm2  . Basal cell carcinoma, ear 03/31/2015  . Bradycardia 07/03/2014  . DDD (degenerative disc disease), lumbar 08/06/2015  . H/O cardiac catheterization 07/03/2014   Overview:  Cypher stent proximal and distal RCA 07/18/05 and TAXUS stent mid LAD 07/20/05 at [facility]  . HTN (hypertension) 07/03/2014  . Hyperlipidemia 07/03/2014  . Lumbosacral radiculopathy at S1 08/22/2015  . MI (myocardial infarction) ([place]) 07/03/2014   Overview:  Mi 07/17/05  . Myocardial infarction ([place])   . Normocytic normochromic anemia 09/14/2015  . Pacemaker 07/03/2014   Overview:  Dual chamber pacemaker 08/11/11  . Shortness of breath dyspnea   . SOB (shortness of breath) on exertion 05/13/2016    PAST SURGICAL HISTORY:  Past Surgical History:  Procedure Laterality Date  . CARDIAC CATHETERIZATION Bilateral 07/14/2016   Procedure: Right/Left Heart Cath and Coronary Angiography;   Surgeon: Isaias Cowman, MD;  Location: [facility];  Service: Cardiovascular;  Laterality: Bilateral;  . CORONARY ANGIOPLASTY    . HERNIA REPAIR      SOCIAL HISTORY:  Social History   Tobacco Use  . Smoking status: Former Smoker    Years: 10.00    Types: Cigarettes  . Smokeless tobacco: Former Network engineer Use Topics  . Alcohol use: No    FAMILY HISTORY:  Family History  Problem Relation Age of Onset  . Bladder Cancer Neg Hx   . Kidney cancer Neg Hx   . Prostate cancer Neg Hx     DRUG ALLERGIES: No Known Allergies  REVIEW OF SYSTEMS:   CONSTITUTIONAL: No fever, fatigue or weakness.  EYES: No blurred or double vision.  EARS, NOSE, AND THROAT: No tinnitus or ear pain.  RESPIRATORY: No cough, shortness of breath, wheezing or hemoptysis.  CARDIOVASCULAR: Had chest pain, no orthopnea, edema.  GASTROINTESTINAL: No nausea, vomiting, diarrhea or abdominal pain.  GENITOURINARY: No dysuria, hematuria.  ENDOCRINE: No polyuria, nocturia,  HEMATOLOGY: No anemia, easy bruising or bleeding SKIN: No rash or lesion. MUSCULOSKELETAL: No joint pain or arthritis.   NEUROLOGIC: No tingling, numbness, weakness.  PSYCHIATRY: No anxiety or depression.   MEDICATIONS AT HOME:  Prior to Admission medications   Medication Sig Start Date End Date Taking? Authorizing Provider  albuterol (PROAIR HFA) 108 (90 Base) MCG/ACT inhaler INHALE TWO INHALATIONS INTO THE LUNGS EVERY SIX HOURS AS NEEDED FOR WHEEZING 12/23/17  Yes [provider]  aspirin EC  81 MG tablet Take 81 mg by mouth daily.   Yes [provider]  atorvastatin (LIPITOR) 80 MG tablet Take 80 mg by mouth daily.   Yes [provider]  budesonide-formoterol (SYMBICORT) 160-4.5 MCG/ACT inhaler Inhale 2 puffs into the lungs 2 (two) times daily. 04/18/18 04/18/19 Yes [provider]  clopidogrel (PLAVIX) 75 MG tablet Take 75 mg by mouth daily.   Yes [provider]   ipratropium-albuterol (DUONEB) 0.5-2.5 (3) MG/3ML SOLN Inhale 3 mLs into the lungs every 4 (four) hours as needed.  02/06/18 02/01/19 Yes [provider]  isosorbide mononitrate (IMDUR) 60 MG 24 hr tablet TAKE ONE-HALF TABLET BY MOUTH DAILY 06/17/17  Yes [provider]  nitroGLYCERIN (NITROSTAT) 0.6 MG SL tablet Place 0.6 mg under the tongue every 5 (five) minutes as needed for chest pain.   Yes [provider]  ramipril (ALTACE) 5 MG capsule Take 5 mg by mouth daily.   Yes [provider]      PHYSICAL EXAMINATION:   VITAL SIGNS: Blood pressure (!) 129/102, pulse 66, resp. rate 18, height 6' (1.829 m), weight 90.7 kg, SpO2 96 %.  GENERAL:  85 y.o.-year-old patient lying in the bed with no acute distress.  EYES: Pupils equal, round, reactive to light and accommodation. No scleral icterus. Extraocular muscles intact.  HEENT: Head atraumatic, normocephalic. Oropharynx and nasopharynx clear.  NECK:  Supple, no jugular venous distention. No thyroid enlargement, no tenderness.  LUNGS: Normal breath sounds bilaterally, no wheezing, rales,rhonchi or crepitation. No use of accessory muscles of respiration.  CARDIOVASCULAR: S1, S2 normal. No murmurs, rubs, or gallops.  ABDOMEN: Soft, nontender, nondistended. Bowel sounds present. No organomegaly or mass.  EXTREMITIES: No pedal edema, cyanosis, or clubbing.  NEUROLOGIC: Cranial nerves II through XII are intact. Muscle strength 5/5 in all extremities. Sensation intact. Gait not checked.  PSYCHIATRIC: The patient is alert and oriented x 3.  SKIN: No obvious rash, lesion, or ulcer.   LABORATORY PANEL:   CBC Recent Labs  Lab 12/26/18 1128  WBC 5.9  HGB 15.1  HCT 45.8  PLT 145*  MCV 88.1  MCH 29.0  MCHC 33.0  RDW 12.4   ------------------------------------------------------------------------------------------------------------------  Chemistries  Recent Labs  Lab 12/26/18 1128  NA 137  K 4.0  CL 102   CO2 29  GLUCOSE 109*  BUN 14  CREATININE 1.31*  CALCIUM 9.3   ------------------------------------------------------------------------------------------------------------------ estimated creatinine clearance is 46.1 mL/min (A) (by C-G formula based on SCr of 1.31 mg/dL (H)). ------------------------------------------------------------------------------------------------------------------ No results for input(s): TSH, T4TOTAL, T3FREE, THYROIDAB in the last 72 hours.  Invalid input(s): FREET3   Coagulation profile No results for input(s): INR, PROTIME in the last 168 hours. ------------------------------------------------------------------------------------------------------------------- No results for input(s): DDIMER in the last 72 hours. -------------------------------------------------------------------------------------------------------------------  Cardiac Enzymes Recent Labs  Lab 12/26/18 1128  TROPONINI <0.03   ------------------------------------------------------------------------------------------------------------------ Invalid input(s): POCBNP  ---------------------------------------------------------------------------------------------------------------  Urinalysis    Component Value Date/Time   COLORURINE YELLOW (A) 10/08/2016 1209   APPEARANCEUR Clear 10/15/2016 1032   LABSPEC 1.036 (H) 10/08/2016 1209   PHURINE 6.0 10/08/2016 1209   GLUCOSEU Negative 10/15/2016 1032   HGBUR 2+ (A) 10/08/2016 1209   BILIRUBINUR Negative 10/15/2016 [place] 10/08/2016 1209   PROTEINUR Negative 10/15/2016 [place] 10/08/2016 1209   NITRITE Negative 10/15/2016 1032   NITRITE NEGATIVE 10/08/2016 1209   LEUKOCYTESUR Trace (A) 10/15/2016 1032     RADIOLOGY: Dg Chest 2 View  Result Date: 12/26/2018 CLINICAL DATA:  Chest pain, shortness of breath. EXAM: CHEST - 2 VIEW COMPARISON:  Radiographs of December 01, 2017. FINDINGS: Stable  cardiomediastinal silhouette. Atherosclerosis of thoracic aorta is noted. Left-sided pacemaker is unchanged in position. No pneumothorax is noted. Blunting of post costophrenic sulci is noted suggesting scarring. No significant consolidative process is noted. Bony thorax is unremarkable. IMPRESSION: No active cardiopulmonary disease. Aortic Atherosclerosis (ICD10-I70.0). Electronically Signed   By: Marijo Conception, M.D.   On: 12/26/2018 12:00    EKG: Orders placed or performed during the hospital encounter of 12/26/18  . ED EKG within 10 minutes  . ED EKG within 10 minutes  . EKG 12-Lead  . EKG 12-Lead  . EKG 12-Lead  . EKG 12-Lead    IMPRESSION AND PLAN:  85 year old elderly male patient with history of pacemaker, hypertension, hyperlipidemia, myocardial infarction in the past presented to the emergency room for chest pain  -Chest pain Admit patient to telemetry observation bed Start patient on aspirin and nitrates N.p.o. after midnight for stress test in a.m. Check echocardiogram Cardiology consult  -Hyperlipidemia Continue statin medication  -Hypertension Continue ramipril for control of blood pressure Start beta-blocker  -DVT prophylaxis subcu Lovenox daily    All the records are reviewed and case discussed with ED provider. Management plans discussed with the patient, family and they are in agreement.  CODE STATUS:Full code Advance Directive Documentation     Most Recent Value  Type of Advance Directive  Healthcare Power of Attorney  Pre-existing out of facility DNR order (yellow form or pink MOST form)  -  "MOST" Form in Place?  -       TOTAL TIME TAKING CARE OF THIS PATIENT: 52 minutes.    Saundra Shelling M.D on 12/26/2018 at 1:06 PM  Between 7am to 6pm - Pager - [phone]  After 6pm go to [email or web address] - password [email or web address] [facility]  [place] [facility]  Office  [phone]  CC: Primary care physician; Glendon Axe, MD

## 2018-12-26 NOTE — Care Management Note (Addendum)
Case Management Note  Patient Details  Name: Kristopher Fernandez MRN: [id number] Date of Birth: 02/12/1932  Subjective/Objective:   Patient is under observation for chest pain and shortness of breath.  Patient is from home and lives with wife.  Patient reports independent and drives.  Patient reports that he and his wife have canes, and a walker.  He reports using oxygen sometimes at home his wife also uses oxygen but neither are wearing any at this time.  O2 company is [employer] forces training and education [employer].  PCP is Dr. Candiss Norse and he sees him regularly.  [facility].   No needs identified at this time. Doran Clay Rn BSN [phone]                  Action/Plan:   Expected Discharge Date:                  Expected Discharge Plan:  Home/Self Care  In-House Referral:     Discharge planning Services     Post Acute Care Choice:    Choice offered to:     DME Arranged:    DME Agency:     HH Arranged:    [place] Agency:     Status of Service:  Completed, signed off  If discussed at [place] of Stay Meetings, dates discussed:    Additional Comments:  Shelbie Hutching, RN 12/26/2018, 4:27 PM

## 2018-12-26 NOTE — ED Notes (Signed)
Samantha RN, aware of bed assigned  

## 2018-12-26 NOTE — ED Triage Notes (Signed)
Pt reports heavy chest pain that started yesterday and SOB. Pt reports hx of MI and states that he has 6 stents.

## 2018-12-26 NOTE — ED Notes (Signed)
Pt back from X-ray.  

## 2018-12-26 NOTE — Care Management Obs Status (Signed)
[facility] NOTIFICATION   Patient Details  Name: Kristopher Fernandez MRN: [id number] Date of Birth: [birth year]   Medicare Observation Status Notification Given:  Yes    Shelbie Hutching, RN 12/26/2018, 4:26 PM

## 2018-12-26 NOTE — ED Notes (Signed)
Pt given meal tray and drink at this time 

## 2018-12-26 NOTE — Progress Notes (Signed)
Advanced care plan.  Purpose of the Encounter: CODE STATUS  Parties in Attendance: Patient and family  Patient's Decision Capacity: Good  Subjective/Patient's story: Presented to emergency room with chest pain   Objective/Medical story Patient has history of pacemaker Needs cardiology evaluation serial troponin echocardiogram Cardiac work-up   Goals of care determination:  Advance care directives goals of care and treatment plan discussed Patient wants everything done which includes CPR, intubation ventilator if the need arises   CODE STATUS: Full code   Time spent discussing advanced care planning: 16 minutes

## 2018-12-26 NOTE — ED Notes (Signed)
Patient transported to X-ray 

## 2018-12-26 NOTE — ED Provider Notes (Signed)
[facility] Emergency Department Provider Note  ___________________________________________   First MD Initiated Contact with Patient 12/26/18 1120     (approximate)  I have reviewed the triage vital signs and the nursing notes.   HISTORY  Chief Complaint Chest Pain and Shortness of Breath   HPI Kristopher Fernandez is a 85 y.o. male with a history of hypertension as well as hyperlipidemia and an MI with stents x6 who is presenting to the emergency department today with chest pain over the past 24 hours.  He says the pain started "last evening."  Says that the pain alternates between being sharp and dull.  He says that the pain is on the left side of his chest and radiates to the left side of his neck with associated numbness to the left arm.  He also has shortness of breath which is worsens with activity.  He came to the hospital today because the pain has been constant now for approximately the last 30 minutes.  He also reports that he is nauseous but has not vomited.  Has had multiple stents in the past and says that this feels similar to his cardiac related pain.  He says he last took aspirin, 81 mg as well as Plavix last night.  Has not taken any nitro today.   Past Medical History:  Diagnosis Date  . Anginal pain ([place])   . Aortic stenosis 07/03/2014   Overview:  Mild with calculated aortic valve area of 1.05cm2  . Basal cell carcinoma, ear 03/31/2015  . Bradycardia 07/03/2014  . DDD (degenerative disc disease), lumbar 08/06/2015  . H/O cardiac catheterization 07/03/2014   Overview:  Cypher stent proximal and distal RCA 07/18/05 and TAXUS stent mid LAD 07/20/05 at [facility]  . HTN (hypertension) 07/03/2014  . Hyperlipidemia 07/03/2014  . Lumbosacral radiculopathy at S1 08/22/2015  . MI (myocardial infarction) ([place]) 07/03/2014   Overview:  Mi 07/17/05  . Myocardial infarction ([place])   . Normocytic normochromic anemia 09/14/2015  . Pacemaker 07/03/2014   Overview:  Dual  chamber pacemaker 08/11/11  . Shortness of breath dyspnea   . SOB (shortness of breath) on exertion 05/13/2016    Patient Active Problem List   Diagnosis Date Noted  . Ascending aortic aneurysm ([place]) 02/09/2018  . Status post cardiac catheterization 07/14/2016  . SOB (shortness of breath) on exertion 05/13/2016  . 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 Normocytic normochromic anemia 09/14/2015  . Lumbosacral radiculopathy at S1 08/22/2015  . DDD (degenerative disc disease), lumbar 08/06/2015  . Basal cell carcinoma, ear 03/31/2015  . History of malignant neoplasm of skin 11/18/2014  . Aortic stenosis 07/03/2014  . Aortic valve stenosis 07/03/2014  . Bradycardia 07/03/2014  . H/O cardiac catheterization 07/03/2014  . HTN (hypertension) 07/03/2014  . Hyperlipidemia 07/03/2014  . Hypertension 07/03/2014  . MI (myocardial infarction) ([place]) 07/03/2014  . Pacemaker 07/03/2014  . Shoulder pain 11/20/2013  . Pain in joint involving pelvic region and thigh 11/27/2012  . Trochanteric bursitis 11/27/2012    Past Surgical History:  Procedure Laterality Date  . CARDIAC CATHETERIZATION Bilateral 07/14/2016   Procedure: Right/Left Heart Cath and Coronary Angiography;  Surgeon: Isaias Cowman, MD;  Location: [place] [facility];  Service: Cardiovascular;  Laterality: Bilateral;  . CORONARY ANGIOPLASTY    . HERNIA REPAIR      Prior to Admission medications   Medication Sig Start Date End Date Taking? Authorizing Provider  albuterol (PROAIR HFA) 108 (90 Base) MCG/ACT  inhaler INHALE TWO INHALATIONS INTO THE LUNGS EVERY SIX HOURS AS NEEDED FOR WHEEZING 12/23/17  Yes [provider]  aspirin EC 81 MG tablet Take 81 mg by mouth daily.   Yes [provider]  atorvastatin (LIPITOR) 80 MG tablet Take 80 mg by mouth daily.   Yes [provider]  budesonide-formoterol (SYMBICORT) 160-4.5 MCG/ACT inhaler Inhale 2 puffs into the lungs  2 (two) times daily. 04/18/18 04/18/19 Yes [provider]  clopidogrel (PLAVIX) 75 MG tablet Take 75 mg by mouth daily.   Yes [provider]  ipratropium-albuterol (DUONEB) 0.5-2.5 (3) MG/3ML SOLN Inhale 3 mLs into the lungs every 4 (four) hours as needed.  02/06/18 02/01/19 Yes [provider]  isosorbide mononitrate (IMDUR) 60 MG 24 hr tablet TAKE ONE-HALF TABLET BY MOUTH DAILY 06/17/17  Yes [provider]  nitroGLYCERIN (NITROSTAT) 0.6 MG SL tablet Place 0.6 mg under the tongue every 5 (five) minutes as needed for chest pain.   Yes [provider]  ramipril (ALTACE) 5 MG capsule Take 5 mg by mouth daily.   Yes [provider]    Allergies Patient has no known allergies.  Family History  Problem Relation Age of Onset  . Bladder Cancer Neg Hx   . Kidney cancer Neg Hx   . Prostate cancer Neg Hx     Social History Social History   Tobacco Use  . Smoking status: Former Smoker    Years: 10.00    Types: Cigarettes  . Smokeless tobacco: Former Network engineer Use Topics  . Alcohol use: No  . Drug use: No    Review of Systems  Constitutional: No fever/chills Eyes: No visual changes. ENT: No sore throat. Cardiovascular: As above Respiratory: As above Gastrointestinal: No abdominal pain.  no vomiting.  No diarrhea.  No constipation. Genitourinary: Negative for dysuria. Musculoskeletal: Negative for back pain. Skin: Negative for rash. Neurological: Negative for headaches, focal weakness   ____________________________________________   PHYSICAL EXAM:  VITAL SIGNS: ED Triage Vitals [12/26/18 1113]  Enc Vitals Group     BP      Pulse      Resp      Temp      Temp src      SpO2      Weight 200 lb (90.7 kg)     Height 6' (1.829 m)     Head Circumference      Peak Flow      Pain Score 10     Pain Loc      Pain Edu?      Excl. in [place]?     Constitutional: Alert and oriented.  Patient appears uncomfortable. Eyes:  Conjunctivae are normal.  Head: Atraumatic. Nose: No congestion/rhinnorhea. Mouth/Throat: Mucous membranes are moist.  Neck: No stridor.   Cardiovascular: Normal rate, regular rhythm. Grossly normal heart sounds.  Good peripheral circulation with equal and bilateral radial as well as dorsalis pedis pulses.  No chest wall tenderness to palpation. Respiratory: Normal respiratory effort.  No retractions. Lungs CTAB. Gastrointestinal: Soft and nontender. No distention.  Musculoskeletal: No lower extremity tenderness nor edema.  No joint effusions. Neurologic:  Normal speech and language. No gross focal neurologic deficits are appreciated. Skin:  Skin is warm, dry and intact. No rash noted. Psychiatric: Mood and affect are normal. Speech and behavior are normal.  ____________________________________________   LABS (all labs ordered are listed, but only abnormal results are displayed)  Labs Reviewed  BASIC METABOLIC PANEL - Abnormal; Notable  for the following components:      Result Value   Glucose, Bld 109 (*)    Creatinine, Ser 1.31 (*)    GFR calc non Af Amer 50 (*)    GFR calc Af Amer 58 (*)    All other components within normal limits  CBC - Abnormal; Notable for the following components:   Platelets 145 (*)    All other components within normal limits  TROPONIN I   ____________________________________________  EKG  ED ECG REPORT I, Doran Stabler, the attending physician, personally viewed and interpreted this ECG.   Date: 12/26/2018  EKG Time: 1116  Rate: 69  Rhythm: Atrial paced rhythm with several PVCs.  Axis: Normal  Intervals:none  ST&T Change: No ST segment elevation.  Minimal ST depressions in V5 and V6.  ED ECG REPORT I, Doran Stabler, the attending physician, personally viewed and interpreted this ECG.   Date: 12/26/2018  EKG Time: 1129  Rate: 80  Rhythm: AV dual paced rhythm with several PVCs  Axis: Normal  Intervals:none  ST&T Change: 1 to 2 mm  ST depressions in V4 through V6.  No ST elevations.  No abnormal T wave inversions.  ____________________________________________  RADIOLOGY  No acute process on chest xray ____________________________________________   PROCEDURES  Procedure(s) performed:   Procedures  Critical Care performed:   ____________________________________________   INITIAL IMPRESSION / ASSESSMENT AND PLAN / ED COURSE  Pertinent labs & imaging results that were available during my care of the patient were reviewed by me and considered in my medical decision making (see chart for details).  Differential diagnosis includes, but is not limited to, ACS, aortic dissection, pulmonary embolism, cardiac tamponade, pneumothorax, pneumonia, pericarditis, myocarditis, GI-related causes including esophagitis/gastritis, and musculoskeletal chest wall pain.   As part of my medical decision making, I reviewed the following data within the [facility] outpatient cardiology notes.  ----------------------------------------- 12:35 PM on 12/26/2018 -----------------------------------------  Patient is pain-free after 2 nitroglycerin.  Initial negative troponin.  Discussed case with Dr. Saralyn Pilar.  We will not start heparin.  However, patient will be admitted for observation.  Signed out to Dr. Estanislado Pandy.  Patient understanding of the diagnosis as well as treatment plan and willing to comply. ____________________________________________   FINAL CLINICAL IMPRESSION(S) / ED DIAGNOSES  Unstable angina.  NEW MEDICATIONS STARTED DURING THIS VISIT:  New Prescriptions   No medications on file     Note:  This document was prepared using Dragon voice recognition software and may include unintentional dictation errors.     Orbie Pyo, MD 12/26/18 1236

## 2018-12-26 NOTE — Consult Note (Signed)
[facility] Cardiology  CARDIOLOGY CONSULT NOTE  Patient ID: Kristopher Fernandez MRN: [id number] DOB/AGE: Dec 10, 1932 85 y.o.  Admit date: 12/26/2018 Referring Physician Dr. Reatha Harps Fernandez Primary Physician Dr. Glendon Fernandez  Primary Cardiologist Dr. Saralyn Fernandez  Reason for Consultation Unstable angina  HPI: Kristopher Fernandez is a 85 year old male with a past medical history significant for coronary artery disease, aortic stenosis, sick sinus syndrome s/p dual chamber pacemaker insertion, hypertension, and hyperlipidemia who presented to the ED on 12/26/18 with a several hour history of chest pain.  Pain is described as sharp, mid-sternal pain that radiates down left arm.  Associated shortness of breath.  Mild relief with sublingual Nitroglycerin.  Similar pain to previous MI.  Workup in the ED including negative troponin x1, ECG revealed dual paced rhythm, and chest x-ray negative for acute cardiopulmonary disease.    Followed in outpatient cardiology by Dr. Saralyn Fernandez at [facility]. History of coronary artery disease s/p PCI to distal RCA in 2006, mid LAD in 2006, overlapping stents in ostium and proximal RCA in 2015.  Most recent echocardiogram on 04/26/17 revealed moderate LV systolic dysfunction, mild aortic stenosis and mild aortic regurgitation.    Review of systems complete and found to be negative unless listed above     Past Medical History:  Diagnosis Date  . Anginal pain ([place])   . Aortic stenosis 07/03/2014   Overview:  Mild with calculated aortic valve area of 1.05cm2  . Basal cell carcinoma, ear 03/31/2015  . Bradycardia 07/03/2014  . DDD (degenerative disc disease), lumbar 08/06/2015  . H/O cardiac catheterization 07/03/2014   Overview:  Cypher stent proximal and distal RCA 07/18/05 and TAXUS stent mid LAD 07/20/05 at [facility]  . HTN (hypertension) 07/03/2014  . Hyperlipidemia 07/03/2014  . Lumbosacral radiculopathy at S1 08/22/2015  . MI (myocardial infarction) ([place]) 07/03/2014   Overview:  Mi 07/17/05  .  Myocardial infarction ([place])   . Normocytic normochromic anemia 09/14/2015  . Pacemaker 07/03/2014   Overview:  Dual chamber pacemaker 08/11/11  . Shortness of breath dyspnea   . SOB (shortness of breath) on exertion 05/13/2016    Past Surgical History:  Procedure Laterality Date  . CARDIAC CATHETERIZATION Bilateral 07/14/2016   Procedure: Right/Left Heart Cath and Coronary Angiography;  Surgeon: Kristopher Cowman, MD;  Location: [place] [facility];  Service: Cardiovascular;  Laterality: Bilateral;  . CORONARY ANGIOPLASTY    . HERNIA REPAIR      (Not in a hospital admission)  Social History   Socioeconomic History  . Marital status: Married    Spouse name: Not on file  . Number of children: Not on file  . Years of education: Not on file  . Highest education level: Not on file  Occupational History  . Not on file  Social Needs  . Financial resource strain: Not on file  . Food insecurity:    Worry: Not on file    Inability: Not on file  . Transportation needs:    Medical: Not on file    Non-medical: Not on file  Tobacco Use  . Smoking status: Former Smoker    Years: 10.00    Types: Cigarettes  . Smokeless tobacco: Former Network engineer and Sexual Activity  . Alcohol use: No  . Drug use: No  . Sexual activity: Not on file  Lifestyle  . Physical activity:    Days per week: Not on file    Minutes per session: Not on file  . Stress: Not on file  Relationships  . Social connections:    Talks on phone: Not on file    Gets together: Not on file    Attends religious service: Not on file    Active member of club or organization: Not on file    Attends meetings of clubs or organizations: Not on file    Relationship status: Not on file  . Intimate partner violence:    Fear of current or ex partner: Not on file    Emotionally abused: Not on file    Physically abused: Not on file    Forced sexual activity: Not on file  Other Topics Concern  . Not on file  Social  History Narrative  . Not on file    Family History  Problem Relation Age of Onset  . Bladder Cancer Neg Hx   . Kidney cancer Neg Hx   . Prostate cancer Neg Hx       Review of systems complete and found to be negative unless listed above      PHYSICAL EXAM  General: Well developed, well nourished, in no acute distress HEENT:  Normocephalic and atramatic Neck:  No JVD.  Lungs: Clear bilaterally to auscultation and percussion. Heart: HRRR . Normal S1 and S2 without gallops or murmurs.  Abdomen: Bowel sounds are positive, abdomen soft and non-tender  Msk:  Back normal, normal gait. Normal strength and tone for age. Extremities: No clubbing, cyanosis or edema.   Neuro: Alert and oriented X 3. Psych:  Good affect, responds appropriately  Labs:   Lab Results  Component Value Date   WBC 5.9 12/26/2018   HGB 15.1 12/26/2018   HCT 45.8 12/26/2018   MCV 88.1 12/26/2018   PLT 145 (L) 12/26/2018    Recent Labs  Lab 12/26/18 1128  NA 137  K 4.0  CL 102  CO2 29  BUN 14  CREATININE 1.31*  CALCIUM 9.3  GLUCOSE 109*   Lab Results  Component Value Date   CKTOTAL 75 03/27/2014   CKMB 1.8 03/27/2014   TROPONINI <0.03 12/26/2018   No results found for: CHOL No results found for: HDL No results found for: LDLCALC No results found for: TRIG No results found for: CHOLHDL No results found for: LDLDIRECT    Radiology: Dg Chest 2 View  Result Date: 12/26/2018 CLINICAL DATA:  Chest pain, shortness of breath. EXAM: CHEST - 2 VIEW COMPARISON:  Radiographs of December 01, 2017. FINDINGS: Stable cardiomediastinal silhouette. Atherosclerosis of thoracic aorta is noted. Left-sided pacemaker is unchanged in position. No pneumothorax is noted. Blunting of post costophrenic sulci is noted suggesting scarring. No significant consolidative process is noted. Bony thorax is unremarkable. IMPRESSION: No active cardiopulmonary disease. Aortic Atherosclerosis (ICD10-I70.0). Electronically Signed    By: Kristopher Fernandez, M.D.   On: 12/26/2018 12:00    EKG: AV dual paced rhythm, vent rate 80bpm  ASSESSMENT AND PLAN:  1.  Unstable angina  -First troponin negative; will trend  -Lexiscan scheduled for tomorrow morning; NPO after midnight; consideration for cardiac catheterization pending results   -Continue Imdur and sublingual nitroglycerin as needed  -Echocardiogram pending   2.  History of mild aortic stenosis   -Will monitor progression with echocardiogram  3.  Hyperlipidemia   -Continue atorvastatin 80mg  daily 4.  Hypertension   -Continue current home medication regimen   -Will need to increase/adjust medications if patient remains hypertensive    The history, physical exam findings, and plan of care were all discussed with Dr. Bartholome Bill, and  all decision making was made in collaboration.   Signed: Avie Arenas PA-C 12/26/2018, 5:25 PM

## 2018-12-26 NOTE — Plan of Care (Signed)
  Problem: Pain Managment: Goal: General experience of comfort will improve Outcome: Progressing   Problem: Education: Goal: Understanding of cardiac disease, CV risk reduction, and recovery process will improve Outcome: Progressing

## 2018-12-27 ENCOUNTER — Observation Stay
Admit: 2018-12-27 | Discharge: 2018-12-27 | Disposition: A | Discharge status: Home / Self Care | Payer: Medicare Other | Attending: Internal Medicine | Admitting: Internal Medicine

## 2018-12-27 ENCOUNTER — Observation Stay: Payer: Medicare Other

## 2018-12-27 LAB — FIBRIN DERIVATIVES D-DIMER (ARMC ONLY): FIBRIN DERIVATIVES D-DIMER (ARMC): 832.67 ng{FEU}/mL — AB (ref 0.00–499.00)

## 2018-12-27 LAB — BASIC METABOLIC PANEL
Anion gap: 6 (ref 5–15)
BUN: 15 mg/dL (ref 8–23)
CALCIUM: 8.6 mg/dL — AB (ref 8.9–10.3)
CO2: 28 mmol/L (ref 22–32)
CREATININE: 1.23 mg/dL (ref 0.61–1.24)
Chloride: 103 mmol/L (ref 98–111)
GFR calc Af Amer: >60 mL/min (ref >60)
GFR calc non Af Amer: 54 mL/min — ABNORMAL LOW (ref >60)
Glucose, Bld: 112 mg/dL — ABNORMAL HIGH (ref 70–99)
Potassium: 3.7 mmol/L (ref 3.5–5.1)
SODIUM: 137 mmol/L (ref 135–145)

## 2018-12-27 LAB — LIPID PANEL
Cholesterol: 104 mg/dL (ref 0–200)
HDL: 46 mg/dL (ref >40)
LDL Cholesterol: 43 mg/dL (ref 0–99)
Total CHOL/HDL Ratio: 2.3 RATIO
Triglycerides: 75 mg/dL (ref <150)
VLDL: 15 mg/dL (ref 0–40)

## 2018-12-27 LAB — TROPONIN I
Troponin I: <0.03 ng/mL (ref <0.03)
Troponin I: <0.03 ng/mL (ref <0.03)

## 2018-12-27 MED ORDER — REGADENOSON 0.4 MG/5ML IV SOLN
0.4000 mg | Freq: Once | INTRAVENOUS | Status: AC
  Administered 2018-12-27: 0.4 mg via INTRAVENOUS

## 2018-12-27 MED ORDER — IOHEXOL 350 MG/ML SOLN
75.0000 mL | Freq: Once | INTRAVENOUS | Status: AC | PRN
  Administered 2018-12-27: 75 mL via INTRAVENOUS

## 2018-12-27 MED ORDER — TECHNETIUM TC 99M TETROFOSMIN IV KIT
10.9000 | PACK | Freq: Once | INTRAVENOUS | Status: AC | PRN
  Administered 2018-12-27: 10.9 via INTRAVENOUS

## 2018-12-27 MED ORDER — TECHNETIUM TC 99M TETROFOSMIN IV KIT
30.0000 | PACK | Freq: Once | INTRAVENOUS | Status: AC | PRN
  Administered 2018-12-27: 32.236 via INTRAVENOUS

## 2018-12-27 NOTE — Progress Notes (Signed)
*  PRELIMINARY RESULTS* Echocardiogram 2D Echocardiogram has been performed.  Sherrie Sport 12/27/2018, 3:13 PM

## 2018-12-27 NOTE — Progress Notes (Signed)
[facility] Cardiology  SUBJECTIVE: Kristopher Fernandez is an 85 year old male who presented to the ED on 12/26/2018 with a several hour history of midsternal sharp chest pain radiating down left arm.  Associated shortness of breath.  Similar pain to previous MI.  Troponin has been negative x3, ECG reveals dual paced rhythm, chest x-ray is negative for acute cardiopulmonary disease.  Today, Kristopher Fernandez is sitting up in bed in no acute distress.  Chest pain has improved.  Still admits to mild shortness of breath.  Denies palpitations or lower extremity swelling.  Followed in outpatient cardiology by Dr. Saralyn Pilar at [facility] History of coronary artery disease s/p PCI to distal RCA in 2006, mid LAD in 2006, overlapping stents in ostium and proximal RCA in 2015.  Most recent echocardiogram on 04/26/17 revealed moderate LV systolic dysfunction, mild aortic stenosis and mild aortic regurgitation.    Vitals:   12/26/18 2234 12/26/18 2239 12/27/18 0515 12/27/18 0746  BP: 123/65 123/68 (!) 118/59 107/62  Pulse: 63 63 73 67  Resp:      Temp:   97.8 F (36.6 C) 97.8 F (36.6 C)  TempSrc:   Oral Oral  SpO2:   93% 96%  Weight:   85 kg   Height:         Intake/Output Summary (Last 24 hours) at 12/27/2018 1696 Last data filed at 12/27/2018 0741 Gross per 24 hour  Intake -  Output 300 ml  Net -300 ml      PHYSICAL EXAM  General: Well developed, well nourished, in no acute distress HEENT:  Normocephalic and atramatic Neck:  No JVD.  Lungs: Clear bilaterally to auscultation and percussion. Heart: HRRR .  2/6 crescendo decrescendo systolic murmur. No gallops or rubs Abdomen: Bowel sounds are positive, abdomen soft and non-tender  Msk:  Back normal.  Gait not assessed.  Normal strength and tone for age. Extremities: No clubbing, cyanosis or edema.   Neuro: Alert and oriented X 3. Psych:  Good affect, responds appropriately   LABS: Basic Metabolic Panel: Recent Labs    12/26/18 1128 12/27/18 0245  NA 137 137   K 4.0 3.7  CL 102 103  CO2 29 28  GLUCOSE 109* 112*  BUN 14 15  CREATININE 1.31* 1.23  CALCIUM 9.3 8.6*   Liver Function Tests: No results for input(s): AST, ALT, ALKPHOS, BILITOT, PROT, ALBUMIN in the last 72 hours. No results for input(s): LIPASE, AMYLASE in the last 72 hours. CBC: Recent Labs    12/26/18 1128  WBC 5.9  HGB 15.1  HCT 45.8  MCV 88.1  PLT 145*   Cardiac Enzymes: Recent Labs    12/26/18 1740 12/26/18 2055 12/27/18 0245  TROPONINI <0.03 <0.03 <0.03   BNP: Invalid input(s): POCBNP D-Dimer: No results for input(s): DDIMER in the last 72 hours. Hemoglobin A1C: No results for input(s): HGBA1C in the last 72 hours. Fasting Lipid Panel: Recent Labs    12/27/18 0245  CHOL 104  HDL 46  LDLCALC 43  TRIG 75  CHOLHDL 2.3   Thyroid Function Tests: No results for input(s): TSH, T4TOTAL, T3FREE, THYROIDAB in the last 72 hours.  Invalid input(s): FREET3 Anemia Panel: No results for input(s): VITAMINB12, FOLATE, FERRITIN, TIBC, IRON, RETICCTPCT in the last 72 hours.  Dg Chest 2 View  Result Date: 12/26/2018 CLINICAL DATA:  Chest pain, shortness of breath. EXAM: CHEST - 2 VIEW COMPARISON:  Radiographs of December 01, 2017. FINDINGS: Stable cardiomediastinal silhouette. Atherosclerosis of thoracic aorta is noted. Left-sided pacemaker is  unchanged in position. No pneumothorax is noted. Blunting of post costophrenic sulci is noted suggesting scarring. No significant consolidative process is noted. Bony thorax is unremarkable. IMPRESSION: No active cardiopulmonary disease. Aortic Atherosclerosis (ICD10-I70.0). Electronically Signed   By: Marijo Conception, M.D.   On: 12/26/2018 12:00     Echo: Pending   TELEMETRY: AV dual paced rhythm   ASSESSMENT AND PLAN:  Active Problems:   Chest pain    1.  Unstable angina  -Troponin has been negative x3; will proceed with Lexiscan to further evaluate; further plan pending results  -Continue Imdur and sublingual  nitroglycerin as needed  -Echocardiogram pending 2.  History of mild aortic stenosis  -Will monitor progression with echocardiogram 3.  Hyperlipidemia  -Continue atorvastatin 80 mg daily 4.  Hypertension  -Blood pressure has improved; continue current home medication regimen  The history, physical exam findings, and plan of care were all discussed with Dr. Bartholome Bill, and all decision making was made in collaboration.   Avie Arenas  PA-C 12/27/2018 8:12 AM

## 2018-12-27 NOTE — Progress Notes (Signed)
[place] [facility] [place] NAME: Kristopher Fernandez    MR#:  [id number]  DATE OF BIRTH:  03-08-1932  SUBJECTIVE:  Patient is continuing to complain of shortness of breath, denies chest pain, cardiology input appreciated, for echocardiogram, stress testing today, possible need for heart catheterization, check stat d-dimer REVIEW OF SYSTEMS:  CONSTITUTIONAL: No fever, fatigue or weakness.  EYES: No blurred or double vision.  EARS, NOSE, AND THROAT: No tinnitus or ear pain.  RESPIRATORY: No cough, shortness of breath, wheezing or hemoptysis.  CARDIOVASCULAR: No chest pain, orthopnea, edema.  GASTROINTESTINAL: No nausea, vomiting, diarrhea or abdominal pain.  GENITOURINARY: No dysuria, hematuria.  ENDOCRINE: No polyuria, nocturia,  HEMATOLOGY: No anemia, easy bruising or bleeding SKIN: No rash or lesion. MUSCULOSKELETAL: No joint pain or arthritis.   NEUROLOGIC: No tingling, numbness, weakness.  PSYCHIATRY: No anxiety or depression.   ROS  DRUG ALLERGIES:  No Known Allergies  VITALS:  Blood pressure 107/62, pulse 67, temperature 97.8 F (36.6 C), temperature source Oral, resp. rate 20, height 6' (1.829 m), weight 85 kg, SpO2 96 %.  PHYSICAL EXAMINATION:  GENERAL:  85 y.o.-year-old patient lying in the bed with no acute distress.  EYES: Pupils equal, round, reactive to light and accommodation. No scleral icterus. Extraocular muscles intact.  HEENT: Head atraumatic, normocephalic. Oropharynx and nasopharynx clear.  NECK:  Supple, no jugular venous distention. No thyroid enlargement, no tenderness.  LUNGS: Normal breath sounds bilaterally, no wheezing, rales,rhonchi or crepitation. No use of accessory muscles of respiration.  CARDIOVASCULAR: S1, S2 normal. No murmurs, rubs, or gallops.  ABDOMEN: Soft, nontender, nondistended. Bowel sounds present. No organomegaly or mass.  EXTREMITIES: No pedal edema, cyanosis, or clubbing.  NEUROLOGIC: Cranial nerves II  through XII are intact. Muscle strength 5/5 in all extremities. Sensation intact. Gait not checked.  PSYCHIATRIC: The patient is alert and oriented x 3.  SKIN: No obvious rash, lesion, or ulcer.   Physical Exam LABORATORY PANEL:   CBC Recent Labs  Lab 12/26/18 1128  WBC 5.9  HGB 15.1  HCT 45.8  PLT 145*   ------------------------------------------------------------------------------------------------------------------  Chemistries  Recent Labs  Lab 12/27/18 0245  NA 137  K 3.7  CL 103  CO2 28  GLUCOSE 112*  BUN 15  CREATININE 1.23  CALCIUM 8.6*   ------------------------------------------------------------------------------------------------------------------  Cardiac Enzymes Recent Labs  Lab 12/27/18 0245 12/27/18 0825  TROPONINI <0.03 <0.03   ------------------------------------------------------------------------------------------------------------------  RADIOLOGY:  Dg Chest 2 View  Result Date: 12/26/2018 CLINICAL DATA:  Chest pain, shortness of breath. EXAM: CHEST - 2 VIEW COMPARISON:  Radiographs of December 01, 2017. FINDINGS: Stable cardiomediastinal silhouette. Atherosclerosis of thoracic aorta is noted. Left-sided pacemaker is unchanged in position. No pneumothorax is noted. Blunting of post costophrenic sulci is noted suggesting scarring. No significant consolidative process is noted. Bony thorax is unremarkable. IMPRESSION: No active cardiopulmonary disease. Aortic Atherosclerosis (ICD10-I70.0). Electronically Signed   By: Marijo Conception, M.D.   On: 12/26/2018 12:00    ASSESSMENT AND PLAN:  85 year old elderly male patient with history of pacemaker, hypertension, hyperlipidemia, myocardial infarction in the past presented to the emergency room for chest pain  *Acute chest pain Resolved  Continues to complain of shortness of breath however  Continue ACS protocol, aspirin, nitrates, follow-up echocardiogram, follow-up on stress testing, per  cardiology-may require heart catheterization pending results   *Acute dyspnea  Check stat d-dimer, if elevated will proceed with CT of the chest for further evaluation for possible pulmonary embolism, follow-up on  echocardiogram, cardiac enzymes negative for ACS   *Chronic hyperlipidemia, unspecified Stable Continue statin medication  *Chronic benign essential Hypertension Able on current regiment  Disposition pending clinical course/cardiology clearance   All the records are reviewed and case discussed with Care Management/Social Workerr. Management plans discussed with the patient, family and they are in agreement.  CODE STATUS: full  TOTAL TIME TAKING CARE OF THIS PATIENT: 35 minutes.     POSSIBLE D/C IN 1-2 DAYS, DEPENDING ON CLINICAL CONDITION.   Kristopher Fernandez M.D on 12/27/2018   Between 7am to 6pm - Pager - [phone]  After 6pm go to [email or web address] - password [email or web address] [facility]  Office  [phone]  CC: Primary care physician; Glendon Axe, MD  Note: This dictation was prepared with Dragon dictation along with smaller phrase technology. Any transcriptional errors that result from this process are unintentional.

## 2018-12-27 NOTE — Plan of Care (Signed)
  Problem: Clinical Measurements: Goal: Ability to maintain clinical measurements within normal limits will improve Outcome: Progressing Note:  Patient for stress testing this AM. Awaiting return to the unit. Will continue to monitor for test result and possible need for catheterization (per note). Patient reportedly has 6 cardiac stents. Kristopher Fernandez [facility]

## 2018-12-28 MED ORDER — ISOSORBIDE MONONITRATE ER 60 MG PO TB24: 30.0000 mg | ORAL_TABLET | Freq: Every day | ORAL | 0 refills | Status: DC

## 2018-12-28 MED ORDER — NITROGLYCERIN 0.6 MG SL SUBL: 0.6000 mg | SUBLINGUAL_TABLET | SUBLINGUAL | 12 refills | Status: DC | PRN

## 2018-12-28 NOTE — Progress Notes (Signed)
Patient Name: Kristopher Fernandez Date of Encounter: 12/28/2018  Hospital Problem List     Active Problems:   Chest pain    Patient Profile     85 year old male with mild aortic stenosis, mild cardiomyopathy with an EF of 40 to 45% with history coronary artery disease status post multiple PCI's admitted with chest pain.  Ruled out for myocardial infarction.  Chest pain was somewhat atypical.  Does complain some dyspnea on exertion.  Echo revealed mild aortic stenosis valve area of around 1 to 1.1 cm.  EF 45%.  Functional study showed borderline ischemic changes in the inferior wall with no high-grade reversibility.  Subjective   No chest pain at rest.  Ruled out for myocardial infarction  Inpatient Medications    . aspirin EC  81 mg Oral Daily  . atorvastatin  80 mg Oral Daily  . clopidogrel  75 mg Oral Daily  . enoxaparin (LOVENOX) injection  40 mg Subcutaneous Q24H  . isosorbide mononitrate  30 mg Oral Daily  . mometasone-formoterol  2 puff Inhalation BID  . ramipril  5 mg Oral Daily  . sodium chloride flush  3 mL Intravenous Q12H    Vital Signs    Vitals:   12/27/18 1520 12/27/18 1949 12/28/18 0556 12/28/18 0822  BP: 101/90 118/64 109/60 111/68  Pulse: 76 74 82 65  Resp: (!) 21     Temp: (!) 97.3 F (36.3 C) 98 F (36.7 C) 98.2 F (36.8 C) (!) 97.5 F (36.4 C)  TempSrc: Oral Oral Oral   SpO2: 98% 98% 96% 98%  Weight:   85.1 kg   Height:        Intake/Output Summary (Last 24 hours) at 12/28/2018 0841 Last data filed at 12/27/2018 1854 Gross per 24 hour  Intake 240 ml  Output -  Net 240 ml   Filed Weights   12/26/18 1113 12/27/18 0515 12/28/18 0556  Weight: 90.7 kg 85 kg 85.1 kg    Physical Exam    GEN: Well nourished, well developed, in no acute distress.  HEENT: normal.  Neck: Supple, no JVD, carotid bruits, or masses. Cardiac: RRR, no murmurs, rubs, or gallops. No clubbing, cyanosis, edema.  Radials/DP/PT 2+ and equal bilaterally.  Respiratory:   Respirations regular and unlabored, clear to auscultation bilaterally. GI: Soft, nontender, nondistended, BS + x 4. MS: no deformity or atrophy. Skin: warm and dry, no rash. Neuro:  Strength and sensation are intact. Psych: Normal affect.  Labs    CBC Recent Labs    12/26/18 1128  WBC 5.9  HGB 15.1  HCT 45.8  MCV 88.1  PLT 409*   Basic Metabolic Panel Recent Labs    12/26/18 1128 12/27/18 0245  NA 137 137  K 4.0 3.7  CL 102 103  CO2 29 28  GLUCOSE 109* 112*  BUN 14 15  CREATININE 1.31* 1.23  CALCIUM 9.3 8.6*   Liver Function Tests No results for input(s): AST, ALT, ALKPHOS, BILITOT, PROT, ALBUMIN in the last 72 hours. No results for input(s): LIPASE, AMYLASE in the last 72 hours. Cardiac Enzymes Recent Labs    12/27/18 0245 12/27/18 0825 12/27/18 1406  TROPONINI <0.03 <0.03 <0.03   BNP No results for input(s): BNP in the last 72 hours. D-Dimer No results for input(s): DDIMER in the last 72 hours. Hemoglobin A1C No results for input(s): HGBA1C in the last 72 hours. Fasting Lipid Panel Recent Labs    12/27/18 0245  CHOL 104  HDL 46  LDLCALC 43  TRIG 75  CHOLHDL 2.3   Thyroid Function Tests No results for input(s): TSH, T4TOTAL, T3FREE, THYROIDAB in the last 72 hours.  Invalid input(s): FREET3  Telemetry      Sinus rhythm  ECG    Sinus rhythm with no ischemia  Radiology    Dg Chest 2 View  Result Date: 12/26/2018 CLINICAL DATA:  Chest pain, shortness of breath. EXAM: CHEST - 2 VIEW COMPARISON:  Radiographs of December 01, 2017. FINDINGS: Stable cardiomediastinal silhouette. Atherosclerosis of thoracic aorta is noted. Left-sided pacemaker is unchanged in position. No pneumothorax is noted. Blunting of post costophrenic sulci is noted suggesting scarring. No significant consolidative process is noted. Bony thorax is unremarkable. IMPRESSION: No active cardiopulmonary disease. Aortic Atherosclerosis (ICD10-I70.0). Electronically Signed   By:  Marijo Conception, M.D.   On: 12/26/2018 12:00   Ct Angio Chest Pe W Or Wo Contrast  Result Date: 12/27/2018 CLINICAL DATA:  Chest pain and shortness of Breath EXAM: CT ANGIOGRAPHY CHEST WITH CONTRAST TECHNIQUE: Multidetector CT imaging of the chest was performed using the standard protocol during bolus administration of intravenous contrast. Multiplanar CT image reconstructions and MIPs were obtained to evaluate the vascular anatomy. CONTRAST:  94mL OMNIPAQUE IOHEXOL 350 MG/ML SOLN COMPARISON:  Chest x-ray from the previous day. FINDINGS: Cardiovascular: Atherosclerotic calcifications of the thoracic aorta are noted. Mild dilatation to 4 cm is noted. Mild cardiac enlargement is noted. Multifocal coronary calcifications are seen. The pulmonary artery shows a normal branching pattern. No filling defects are identified to suggest pulmonary emboli. Mediastinum/Nodes: Thoracic inlet is within normal limits. No hilar or mediastinal adenopathy is noted. The esophagus is within normal limits. Lungs/Pleura: Diffuse emphysematous changes are noted bilaterally. No focal infiltrate or sizable effusion is seen. Scattered small calcified and noncalcified nodules are noted consistent with prior granulomatous disease. These are stable from the prior exam and measure less than 5 mm. The tree-in-bud changes in the left base have resolved in the interval. Areas of mucous plugging are noted in the lower lobes bilaterally. Upper Abdomen: Gallstones are noted without complicating factors. The remainder the upper abdomen is within normal limits. Musculoskeletal: Degenerative change of the thoracic spine is noted. No acute bony abnormality is seen. Review of the MIP images confirms the above findings. IMPRESSION: No evidence of pulmonary emboli. Stable ascending aortic dilatation to 4 cm. The previous measurement is likely overestimated due to patient motion and lack of IV contrast. Follow-up remains the same. Recommend annual imaging  followup by CTA or MRA. This recommendation follows 2010 ACCF/AHA/AATS/ACR/ASA/SCA/SCAI/SIR/STS/SVM Guidelines for the Diagnosis and Management of Patients with Thoracic Aortic Disease. Circulation. 2010; 121: V616-W737 Changes of prior granulomatous disease. Aortic Atherosclerosis (ICD10-I70.0) and Emphysema (ICD10-J43.9). Electronically Signed   By: Inez Catalina M.D.   On: 12/27/2018 16:25    Assessment & Plan    Aortic stenosis-echo shows no significant change.  Does not appear to be a candidate for aortic valve intervention at present.  We will continue to follow with serial echoes based on symptoms as an outpatient  Coronary artery disease-status post PCI in 2006 and again in 2015.  Admitted with atypical chest pain.  Ruled out for an MI.  Functional study showed no high-grade ischemia.  Long discussion with the patient regarding his symptoms.  Discussed possibly proceed with left heart cath versus medical management.  After significant discussion, patient prefers to defer cardiac cath at this time.  Will increase isosorbide mononitrate to 60 mg daily and continue with remainder of  his medications including dual antiplatelet therapy.  Okay for discharge from a cardiac standpoint with follow-up per Dr. Saralyn Pilar in 1 week.  Should he continue to have symptoms, consideration for cardiac cath at that time could be raised.  Signed, Javier Docker Misao Fackrell MD 12/28/2018, 8:41 AM  Pager: [phone]

## 2018-12-28 NOTE — Discharge Summary (Signed)
[facility] [place] NAME: Kristopher Fernandez    MR#:  [id number]  DATE OF BIRTH:  [birth year]  DATE OF ADMISSION:  12/26/2018 ADMITTING PHYSICIAN: Saundra Shelling, MD  DATE OF DISCHARGE: No discharge date for patient encounter.  PRIMARY CARE PHYSICIAN: Glendon Axe, MD    ADMISSION DIAGNOSIS:  Unstable angina ([place]) [I20.0]  DISCHARGE DIAGNOSIS:  Active Problems:   Chest pain   SECONDARY DIAGNOSIS:   Past Medical History:  Diagnosis Date  . Anginal pain ([place])   . Aortic stenosis 07/03/2014   Overview:  Mild with calculated aortic valve area of 1.05cm2  . Basal cell carcinoma, ear 03/31/2015  . Bradycardia 07/03/2014  . DDD (degenerative disc disease), lumbar 08/06/2015  . H/O cardiac catheterization 07/03/2014   Overview:  Cypher stent proximal and distal RCA 07/18/05 and TAXUS stent mid LAD 07/20/05 at [facility]  . HTN (hypertension) 07/03/2014  . Hyperlipidemia 07/03/2014  . Lumbosacral radiculopathy at S1 08/22/2015  . MI (myocardial infarction) ([place]) 07/03/2014   Overview:  Mi 07/17/05  . Myocardial infarction ([place])   . Normocytic normochromic anemia 09/14/2015  . Pacemaker 07/03/2014   Overview:  Dual chamber pacemaker 08/11/11  . Shortness of breath dyspnea   . SOB (shortness of breath) on exertion 05/13/2016    HOSPITAL COURSE:  85 year old elderly male patient with history of pacemaker, hypertension, hyperlipidemia, myocardial infarction in the past presented to the emergency room for chest pain  *Acute chest pain Resolved  Treated on our ACS protocol, aspirin, Plavix, statin therapy, Altase, cardiology did see patient while in house-recommended increased imdur 60mg  daily, echocardiogram noted for ejection fraction 45%, stress testing noted for ischemic changes within the inferior wall with no high-grade reversibility, cardiology recommended follow-up status post discharge for reevaluation   *Acute dyspnea  Resolved D-dimer was  elevated, CT chest was negative for pulmonary embolism/noted thoracic aortic aneurysm 4 cm, echocardiogram noted for ejection fraction of 45%  *Acute abnormal CT chest Noted for aortic aneurysm-we will refer to vascular surgery status post discharge for continued medical surveillance/management  *Chronic hyperlipidemia, unspecified Stable Continued statin medication  *Chronic benign essential Hypertension Stable Continue current regiment   DISCHARGE CONDITIONS:   stable  CONSULTS OBTAINED:  Treatment Team:  Teodoro Spray, MD Lujean Amel D, MD  DRUG ALLERGIES:  No Known Allergies  DISCHARGE MEDICATIONS:   Allergies as of 12/28/2018   No Known Allergies     Medication List    TAKE these medications   aspirin EC 81 MG tablet Take 81 mg by mouth daily.   atorvastatin 80 MG tablet Commonly known as:  LIPITOR Take 80 mg by mouth daily.   clopidogrel 75 MG tablet Commonly known as:  PLAVIX Take 75 mg by mouth daily.   ipratropium-albuterol 0.5-2.5 (3) MG/3ML Soln Commonly known as:  DUONEB Inhale 3 mLs into the lungs every 4 (four) hours as needed.   isosorbide mononitrate 60 MG 24 hr tablet Commonly known as:  IMDUR Take 0.5 tablets (30 mg total) by mouth daily. Start taking on:  December 29, 2018   nitroGLYCERIN 0.6 MG SL tablet Commonly known as:  NITROSTAT Place 0.6 mg under the tongue every 5 (five) minutes as needed for chest pain.   PROAIR HFA 108 (90 Base) MCG/ACT inhaler Generic drug:  albuterol INHALE TWO INHALATIONS INTO THE LUNGS EVERY SIX HOURS AS NEEDED FOR WHEEZING   ramipril 5 MG capsule Commonly known as:  ALTACE Take 5 mg by mouth daily.  SYMBICORT 160-4.5 MCG/ACT inhaler Generic drug:  budesonide-formoterol Inhale 2 puffs into the lungs 2 (two) times daily.        DISCHARGE INSTRUCTIONS:      If you experience worsening of your admission symptoms, develop shortness of breath, life threatening emergency, suicidal or  homicidal thoughts you must seek medical attention immediately by calling 911 or calling your MD immediately  if symptoms less severe.  You Must read complete instructions/literature along with all the possible adverse reactions/side effects for all the Medicines you take and that have been prescribed to you. Take any new Medicines after you have completely understood and accept all the possible adverse reactions/side effects.   Please note  You were cared for by a hospitalist during your hospital stay. If you have any questions about your discharge medications or the care you received while you were in the hospital after you are discharged, you can call the unit and asked to speak with the hospitalist on call if the hospitalist that took care of you is not available. Once you are discharged, your primary care physician will handle any further medical issues. Please note that NO REFILLS for any discharge medications will be authorized once you are discharged, as it is imperative that you return to your primary care physician (or establish a relationship with a primary care physician if you do not have one) for your aftercare needs so that they can reassess your need for medications and monitor your lab values.    Today   CHIEF COMPLAINT:   Chief Complaint  Patient presents with  . Chest Pain  . Shortness of Breath    HISTORY OF PRESENT ILLNESS:  85 y.o. male with a known history of aortic stenosis, basal cell cancer, pacemaker, hypertension, hyperlipidemia, myocardial infarction in the past presented to the emergency room for chest pain.  Patient follows up with [facility] cardiology.  Chest pain started yesterday evening located in left-sided chest sharp in nature 5 out of 10 on a scale of 1-10.  First set of troponin has been negative.  Hospitalist service was consulted.  Had an episode of shortness of breath.  VITAL SIGNS:  Blood pressure 111/68, pulse 65, temperature (!) 97.5 F  (36.4 C), resp. rate (!) 21, height 6' (1.829 m), weight 85.1 kg, SpO2 98 %.  I/O:    Intake/Output Summary (Last 24 hours) at 12/28/2018 1009 Last data filed at 12/27/2018 1854 Gross per 24 hour  Intake 240 ml  Output -  Net 240 ml    PHYSICAL EXAMINATION:  GENERAL:  85 y.o.-year-old patient lying in the bed with no acute distress.  EYES: Pupils equal, round, reactive to light and accommodation. No scleral icterus. Extraocular muscles intact.  HEENT: Head atraumatic, normocephalic. Oropharynx and nasopharynx clear.  NECK:  Supple, no jugular venous distention. No thyroid enlargement, no tenderness.  LUNGS: Normal breath sounds bilaterally, no wheezing, rales,rhonchi or crepitation. No use of accessory muscles of respiration.  CARDIOVASCULAR: S1, S2 normal. No murmurs, rubs, or gallops.  ABDOMEN: Soft, non-tender, non-distended. Bowel sounds present. No organomegaly or mass.  EXTREMITIES: No pedal edema, cyanosis, or clubbing.  NEUROLOGIC: Cranial nerves II through XII are intact. Muscle strength 5/5 in all extremities. Sensation intact. Gait not checked.  PSYCHIATRIC: The patient is alert and oriented x 3.  SKIN: No obvious rash, lesion, or ulcer.   DATA REVIEW:   CBC Recent Labs  Lab 12/26/18 1128  WBC 5.9  HGB 15.1  HCT 45.8  PLT 145*  Chemistries  Recent Labs  Lab 12/27/18 0245  NA 137  K 3.7  CL 103  CO2 28  GLUCOSE 112*  BUN 15  CREATININE 1.23  CALCIUM 8.6*    Cardiac Enzymes Recent Labs  Lab 12/27/18 1406  TROPONINI <0.03    Microbiology Results  Results for orders placed or performed in visit on 10/15/16  Microscopic Examination     Status: Abnormal   Collection Time: 10/15/16 10:32 AM  Result Value Ref Range Status   WBC, UA 6-10 (A) 0 - 5 /hpf Final   RBC, UA 0-2 0 - 2 /hpf Final   Epithelial Cells (non renal) 0-10 0 - 10 /hpf Final   Bacteria, UA Few None seen/Few Final    RADIOLOGY:  Dg Chest 2 View  Result Date: 12/26/2018 CLINICAL  DATA:  Chest pain, shortness of breath. EXAM: CHEST - 2 VIEW COMPARISON:  Radiographs of December 01, 2017. FINDINGS: Stable cardiomediastinal silhouette. Atherosclerosis of thoracic aorta is noted. Left-sided pacemaker is unchanged in position. No pneumothorax is noted. Blunting of post costophrenic sulci is noted suggesting scarring. No significant consolidative process is noted. Bony thorax is unremarkable. IMPRESSION: No active cardiopulmonary disease. Aortic Atherosclerosis (ICD10-I70.0). Electronically Signed   By: Marijo Conception, M.D.   On: 12/26/2018 12:00   Ct Angio Chest Pe W Or Wo Contrast  Result Date: 12/27/2018 CLINICAL DATA:  Chest pain and shortness of Breath EXAM: CT ANGIOGRAPHY CHEST WITH CONTRAST TECHNIQUE: Multidetector CT imaging of the chest was performed using the standard protocol during bolus administration of intravenous contrast. Multiplanar CT image reconstructions and MIPs were obtained to evaluate the vascular anatomy. CONTRAST:  55mL OMNIPAQUE IOHEXOL 350 MG/ML SOLN COMPARISON:  Chest x-ray from the previous day. FINDINGS: Cardiovascular: Atherosclerotic calcifications of the thoracic aorta are noted. Mild dilatation to 4 cm is noted. Mild cardiac enlargement is noted. Multifocal coronary calcifications are seen. The pulmonary artery shows a normal branching pattern. No filling defects are identified to suggest pulmonary emboli. Mediastinum/Nodes: Thoracic inlet is within normal limits. No hilar or mediastinal adenopathy is noted. The esophagus is within normal limits. Lungs/Pleura: Diffuse emphysematous changes are noted bilaterally. No focal infiltrate or sizable effusion is seen. Scattered small calcified and noncalcified nodules are noted consistent with prior granulomatous disease. These are stable from the prior exam and measure less than 5 mm. The tree-in-bud changes in the left base have resolved in the interval. Areas of mucous plugging are noted in the lower lobes  bilaterally. Upper Abdomen: Gallstones are noted without complicating factors. The remainder the upper abdomen is within normal limits. Musculoskeletal: Degenerative change of the thoracic spine is noted. No acute bony abnormality is seen. Review of the MIP images confirms the above findings. IMPRESSION: No evidence of pulmonary emboli. Stable ascending aortic dilatation to 4 cm. The previous measurement is likely overestimated due to patient motion and lack of IV contrast. Follow-up remains the same. Recommend annual imaging followup by CTA or MRA. This recommendation follows 2010 ACCF/AHA/AATS/ACR/ASA/SCA/SCAI/SIR/STS/SVM Guidelines for the Diagnosis and Management of Patients with Thoracic Aortic Disease. Circulation. 2010; 121: Z366-Y403 Changes of prior granulomatous disease. Aortic Atherosclerosis (ICD10-I70.0) and Emphysema (ICD10-J43.9). Electronically Signed   By: Inez Catalina M.D.   On: 12/27/2018 16:25    EKG:   Orders placed or performed during the hospital encounter of 12/26/18  . ED EKG within 10 minutes  . ED EKG within 10 minutes  . EKG 12-Lead  . EKG 12-Lead  . EKG 12-Lead  . EKG 12-Lead  Management plans discussed with the patient, family and they are in agreement.  CODE STATUS:     Code Status Orders  (From admission, onward)         Start     Ordered   12/26/18 2041  Full code  Continuous     12/26/18 2040        Code Status History    This patient has a current code status but no historical code status.    Advance Directive Documentation     Most Recent Value  Type of Advance Directive  Healthcare Power of Attorney  Pre-existing out of facility DNR order (yellow form or pink MOST form)  -  "MOST" Form in Place?  -      TOTAL TIME TAKING CARE OF THIS PATIENT: 45 minutes.    Avel Peace  M.D on 12/28/2018 at 10:09 AM  Between 7am to 6pm - Pager - [phone]  After 6pm go to [email or web address] - password [email or web address] [facility]   Office  [phone]  CC: Primary care physician; Glendon Axe, MD   Note: This dictation was prepared with Dragon dictation along with smaller phrase technology. Any transcriptional errors that result from this process are unintentional.

## 2018-12-29 LAB — ECHOCARDIOGRAM COMPLETE
Height: 72 in
Weight: 2998.4 oz

## 2019-01-03 LAB — NM MYOCAR MULTI W/SPECT W/WALL MOTION / EF
CSEPEDS: 0 s
Estimated workload: 1 METS
Exercise duration (min): 1 min
LV dias vol: 210 mL (ref 62–150)
LV sys vol: 151 mL
MPHR: 136 {beats}/min
Peak HR: 74 {beats}/min
Percent HR: 54 %
Rest HR: 58 {beats}/min
SDS: 0
SRS: 31
SSS: 11
TID: 1.12

## 2019-05-09 ENCOUNTER — Inpatient Hospital Stay
Admission: EM | Admit: 2019-05-09 | Discharge: 2019-05-17 | DRG: 280 | Disposition: A | Discharge status: Other Acute Inpatient Hospital | Payer: Medicare Other | Attending: Internal Medicine | Admitting: Internal Medicine

## 2019-05-09 ENCOUNTER — Encounter
Admission: EM | Disposition: A | Discharge status: Other Acute Inpatient Hospital | Payer: Self-pay | Source: Home / Self Care | Attending: Internal Medicine

## 2019-05-09 ENCOUNTER — Encounter: Payer: Self-pay | Admitting: Emergency Medicine

## 2019-05-09 ENCOUNTER — Emergency Department: Payer: Medicare Other

## 2019-05-09 ENCOUNTER — Other Ambulatory Visit: Payer: Self-pay

## 2019-05-09 DIAGNOSIS — J449 Chronic obstructive pulmonary disease, unspecified: Secondary | ICD-10-CM | POA: Diagnosis present

## 2019-05-09 DIAGNOSIS — F4320 Adjustment disorder, unspecified: Secondary | ICD-10-CM

## 2019-05-09 DIAGNOSIS — I130 Hypertensive heart and chronic kidney disease with heart failure and stage 1 through stage 4 chronic kidney disease, or unspecified chronic kidney disease: Secondary | ICD-10-CM | POA: Diagnosis present

## 2019-05-09 DIAGNOSIS — F329 Major depressive disorder, single episode, unspecified: Secondary | ICD-10-CM | POA: Diagnosis present

## 2019-05-09 DIAGNOSIS — I25110 Atherosclerotic heart disease of native coronary artery with unstable angina pectoris: Secondary | ICD-10-CM | POA: Diagnosis present

## 2019-05-09 DIAGNOSIS — I214 Non-ST elevation (NSTEMI) myocardial infarction: Principal | ICD-10-CM | POA: Diagnosis present

## 2019-05-09 DIAGNOSIS — Z79899 Other long term (current) drug therapy: Secondary | ICD-10-CM

## 2019-05-09 DIAGNOSIS — I350 Nonrheumatic aortic (valve) stenosis: Secondary | ICD-10-CM | POA: Diagnosis present

## 2019-05-09 DIAGNOSIS — E876 Hypokalemia: Secondary | ICD-10-CM | POA: Diagnosis present

## 2019-05-09 DIAGNOSIS — R0602 Shortness of breath: Secondary | ICD-10-CM

## 2019-05-09 DIAGNOSIS — F4321 Adjustment disorder with depressed mood: Secondary | ICD-10-CM

## 2019-05-09 DIAGNOSIS — E785 Hyperlipidemia, unspecified: Secondary | ICD-10-CM | POA: Diagnosis present

## 2019-05-09 DIAGNOSIS — R0600 Dyspnea, unspecified: Secondary | ICD-10-CM | POA: Diagnosis present

## 2019-05-09 DIAGNOSIS — Z87891 Personal history of nicotine dependence: Secondary | ICD-10-CM

## 2019-05-09 DIAGNOSIS — Z515 Encounter for palliative care: Secondary | ICD-10-CM

## 2019-05-09 DIAGNOSIS — R296 Repeated falls: Secondary | ICD-10-CM | POA: Diagnosis present

## 2019-05-09 DIAGNOSIS — Z66 Do not resuscitate: Secondary | ICD-10-CM | POA: Diagnosis not present

## 2019-05-09 DIAGNOSIS — I252 Old myocardial infarction: Secondary | ICD-10-CM

## 2019-05-09 DIAGNOSIS — Z950 Presence of cardiac pacemaker: Secondary | ICD-10-CM

## 2019-05-09 DIAGNOSIS — T502X5A Adverse effect of carbonic-anhydrase inhibitors, benzothiadiazides and other diuretics, initial encounter: Secondary | ICD-10-CM | POA: Diagnosis not present

## 2019-05-09 DIAGNOSIS — N183 Chronic kidney disease, stage 3 (moderate): Secondary | ICD-10-CM | POA: Diagnosis present

## 2019-05-09 DIAGNOSIS — Z7982 Long term (current) use of aspirin: Secondary | ICD-10-CM

## 2019-05-09 DIAGNOSIS — R079 Chest pain, unspecified: Secondary | ICD-10-CM | POA: Diagnosis present

## 2019-05-09 DIAGNOSIS — Z955 Presence of coronary angioplasty implant and graft: Secondary | ICD-10-CM

## 2019-05-09 DIAGNOSIS — Z7902 Long term (current) use of antithrombotics/antiplatelets: Secondary | ICD-10-CM

## 2019-05-09 DIAGNOSIS — Z7189 Other specified counseling: Secondary | ICD-10-CM

## 2019-05-09 DIAGNOSIS — Z8673 Personal history of transient ischemic attack (TIA), and cerebral infarction without residual deficits: Secondary | ICD-10-CM

## 2019-05-09 DIAGNOSIS — M5136 Other intervertebral disc degeneration, lumbar region: Secondary | ICD-10-CM | POA: Diagnosis present

## 2019-05-09 DIAGNOSIS — I5023 Acute on chronic systolic (congestive) heart failure: Secondary | ICD-10-CM

## 2019-05-09 DIAGNOSIS — I255 Ischemic cardiomyopathy: Secondary | ICD-10-CM | POA: Diagnosis present

## 2019-05-09 DIAGNOSIS — F4322 Adjustment disorder with anxiety: Secondary | ICD-10-CM | POA: Diagnosis present

## 2019-05-09 DIAGNOSIS — Z85828 Personal history of other malignant neoplasm of skin: Secondary | ICD-10-CM

## 2019-05-09 DIAGNOSIS — J90 Pleural effusion, not elsewhere classified: Secondary | ICD-10-CM | POA: Diagnosis present

## 2019-05-09 DIAGNOSIS — Z1159 Encounter for screening for other viral diseases: Secondary | ICD-10-CM

## 2019-05-09 DIAGNOSIS — I952 Hypotension due to drugs: Secondary | ICD-10-CM | POA: Diagnosis not present

## 2019-05-09 HISTORY — PX: LEFT HEART CATH AND CORONARY ANGIOGRAPHY: CATH118249

## 2019-05-09 LAB — COMPREHENSIVE METABOLIC PANEL
ALT: 18 U/L (ref 0–44)
AST: 22 U/L (ref 15–41)
Albumin: 3.1 g/dL — ABNORMAL LOW (ref 3.5–5.0)
Alkaline Phosphatase: 97 U/L (ref 38–126)
Anion gap: 8 (ref 5–15)
BUN: 19 mg/dL (ref 8–23)
CO2: 27 mmol/L (ref 22–32)
Calcium: 8.6 mg/dL — ABNORMAL LOW (ref 8.9–10.3)
Chloride: 101 mmol/L (ref 98–111)
Creatinine, Ser: 1.26 mg/dL — ABNORMAL HIGH (ref 0.61–1.24)
GFR calc Af Amer: >60 mL/min (ref >60)
GFR calc non Af Amer: 52 mL/min — ABNORMAL LOW (ref >60)
Glucose, Bld: 108 mg/dL — ABNORMAL HIGH (ref 70–99)
Potassium: 3.3 mmol/L — ABNORMAL LOW (ref 3.5–5.1)
Sodium: 136 mmol/L (ref 135–145)
Total Bilirubin: 1.4 mg/dL — ABNORMAL HIGH (ref 0.3–1.2)
Total Protein: 6 g/dL — ABNORMAL LOW (ref 6.5–8.1)

## 2019-05-09 LAB — CBC
HCT: 42 % (ref 39.0–52.0)
Hemoglobin: 13.8 g/dL (ref 13.0–17.0)
MCH: 28.8 pg (ref 26.0–34.0)
MCHC: 32.9 g/dL (ref 30.0–36.0)
MCV: 87.5 fL (ref 80.0–100.0)
Platelets: 146 10*3/uL — ABNORMAL LOW (ref 150–400)
RBC: 4.8 MIL/uL (ref 4.22–5.81)
RDW: 13.9 % (ref 11.5–15.5)
WBC: 7.2 10*3/uL (ref 4.0–10.5)
nRBC: 0 % (ref 0.0–0.2)

## 2019-05-09 LAB — APTT: aPTT: 73 seconds — ABNORMAL HIGH (ref 24–36)

## 2019-05-09 LAB — TROPONIN I
Troponin I: 0.08 ng/mL (ref <0.03)
Troponin I: 0.09 ng/mL (ref <0.03)
Troponin I: 0.09 ng/mL (ref <0.03)

## 2019-05-09 LAB — PROTIME-INR
INR: 1.3 — ABNORMAL HIGH (ref 0.8–1.2)
Prothrombin Time: 15.7 seconds — ABNORMAL HIGH (ref 11.4–15.2)

## 2019-05-09 LAB — BRAIN NATRIURETIC PEPTIDE: B Natriuretic Peptide: 1462 pg/mL — ABNORMAL HIGH (ref 0.0–100.0)

## 2019-05-09 LAB — SARS CORONAVIRUS 2 BY RT PCR (HOSPITAL ORDER, PERFORMED IN ~~LOC~~ HOSPITAL LAB): SARS Coronavirus 2: NEGATIVE

## 2019-05-09 SURGERY — LEFT HEART CATH AND CORONARY ANGIOGRAPHY
Anesthesia: Moderate Sedation

## 2019-05-09 MED ORDER — LABETALOL HCL 5 MG/ML IV SOLN: 10.0000 mg | INTRAVENOUS | Status: AC | PRN

## 2019-05-09 MED ORDER — NITROGLYCERIN 0.4 MG SL SUBL: 0.4000 mg | SUBLINGUAL_TABLET | SUBLINGUAL | Status: DC | PRN

## 2019-05-09 MED ORDER — SODIUM CHLORIDE 0.9 % WEIGHT BASED INFUSION
3.0000 mL/kg/h | INTRAVENOUS | Status: DC
  Administered 2019-05-09: 15:00:00 3 mL/kg/h via INTRAVENOUS

## 2019-05-09 MED ORDER — CARVEDILOL 3.125 MG PO TABS
3.1250 mg | ORAL_TABLET | Freq: Two times a day (BID) | ORAL | Status: DC
  Administered 2019-05-09 – 2019-05-17 (×12): 3.125 mg via ORAL
  Filled 2019-05-09 (×13): qty 1

## 2019-05-09 MED ORDER — NITROGLYCERIN IN D5W 200-5 MCG/ML-% IV SOLN
0.0000 ug/min | INTRAVENOUS | Status: DC
  Administered 2019-05-09: 13:00:00 5 ug/min via INTRAVENOUS
  Filled 2019-05-09: qty 250

## 2019-05-09 MED ORDER — ONDANSETRON HCL 4 MG/2ML IJ SOLN
4.0000 mg | Freq: Four times a day (QID) | INTRAMUSCULAR | Status: DC | PRN
  Administered 2019-05-11: 08:00:00 4 mg via INTRAVENOUS
  Filled 2019-05-09: qty 2

## 2019-05-09 MED ORDER — IOHEXOL 300 MG/ML  SOLN
INTRAMUSCULAR | Status: DC | PRN
  Administered 2019-05-09: 140 mL via INTRA_ARTERIAL

## 2019-05-09 MED ORDER — CLOPIDOGREL BISULFATE 75 MG PO TABS
75.0000 mg | ORAL_TABLET | Freq: Every day | ORAL | Status: DC
  Administered 2019-05-09 – 2019-05-17 (×9): 75 mg via ORAL
  Filled 2019-05-09 (×9): qty 1

## 2019-05-09 MED ORDER — HYDRALAZINE HCL 20 MG/ML IJ SOLN: 10.0000 mg | INTRAMUSCULAR | Status: AC | PRN

## 2019-05-09 MED ORDER — SODIUM CHLORIDE 0.9% FLUSH: 3.0000 mL | Freq: Once | INTRAVENOUS | Status: DC

## 2019-05-09 MED ORDER — MIDAZOLAM HCL 2 MG/2ML IJ SOLN
INTRAMUSCULAR | Status: AC
  Filled 2019-05-09: qty 2

## 2019-05-09 MED ORDER — ACETAMINOPHEN 325 MG PO TABS: 650.0000 mg | ORAL_TABLET | ORAL | Status: DC | PRN

## 2019-05-09 MED ORDER — FENTANYL CITRATE (PF) 100 MCG/2ML IJ SOLN
INTRAMUSCULAR | Status: AC
  Filled 2019-05-09: qty 2

## 2019-05-09 MED ORDER — ASPIRIN 300 MG RE SUPP: 300.0000 mg | RECTAL | Status: AC

## 2019-05-09 MED ORDER — ASPIRIN EC 81 MG PO TBEC: 81.0000 mg | DELAYED_RELEASE_TABLET | Freq: Every day | ORAL | Status: DC

## 2019-05-09 MED ORDER — ATORVASTATIN CALCIUM 20 MG PO TABS
80.0000 mg | ORAL_TABLET | Freq: Every day | ORAL | Status: DC
  Administered 2019-05-09 – 2019-05-16 (×8): 80 mg via ORAL
  Filled 2019-05-09 (×8): qty 4

## 2019-05-09 MED ORDER — HEPARIN (PORCINE) IN NACL 1000-0.9 UT/500ML-% IV SOLN
INTRAVENOUS | Status: DC | PRN
  Administered 2019-05-09: 500 mL

## 2019-05-09 MED ORDER — SODIUM CHLORIDE 0.9 % WEIGHT BASED INFUSION: 1.0000 mL/kg/h | INTRAVENOUS | Status: DC

## 2019-05-09 MED ORDER — SODIUM CHLORIDE 0.9 % WEIGHT BASED INFUSION
1.0000 mL/kg/h | INTRAVENOUS | Status: AC
  Administered 2019-05-09: 1 mL/kg/h via INTRAVENOUS

## 2019-05-09 MED ORDER — ASPIRIN 81 MG PO CHEW
CHEWABLE_TABLET | ORAL | Status: AC
  Administered 2019-05-09: 19:00:00
  Filled 2019-05-09: qty 1

## 2019-05-09 MED ORDER — HEPARIN (PORCINE) IN NACL 1000-0.9 UT/500ML-% IV SOLN
INTRAVENOUS | Status: AC
  Filled 2019-05-09: qty 1000

## 2019-05-09 MED ORDER — SODIUM CHLORIDE 0.9% FLUSH: 3.0000 mL | INTRAVENOUS | Status: DC | PRN

## 2019-05-09 MED ORDER — NITROGLYCERIN 2 % TD OINT
1.0000 [in_us] | TOPICAL_OINTMENT | Freq: Once | TRANSDERMAL | Status: AC
  Administered 2019-05-09: 11:00:00 1 [in_us] via TOPICAL
  Filled 2019-05-09: qty 1

## 2019-05-09 MED ORDER — NITROGLYCERIN 0.4 MG SL SUBL
0.4000 mg | SUBLINGUAL_TABLET | Freq: Once | SUBLINGUAL | Status: AC
  Administered 2019-05-09: 11:00:00 0.4 mg via SUBLINGUAL
  Filled 2019-05-09: qty 1

## 2019-05-09 MED ORDER — ASPIRIN 81 MG PO CHEW: 81.0000 mg | CHEWABLE_TABLET | ORAL | Status: DC

## 2019-05-09 MED ORDER — ASPIRIN EC 81 MG PO TBEC
81.0000 mg | DELAYED_RELEASE_TABLET | Freq: Every day | ORAL | Status: DC
  Administered 2019-05-10 – 2019-05-17 (×8): 81 mg via ORAL
  Filled 2019-05-09 (×8): qty 1

## 2019-05-09 MED ORDER — NITROGLYCERIN 0.6 MG SL SUBL: 0.6000 mg | SUBLINGUAL_TABLET | SUBLINGUAL | Status: DC | PRN

## 2019-05-09 MED ORDER — RAMIPRIL 5 MG PO CAPS
5.0000 mg | ORAL_CAPSULE | Freq: Every day | ORAL | Status: DC
  Administered 2019-05-09 – 2019-05-10 (×2): 5 mg via ORAL
  Filled 2019-05-09 (×3): qty 1

## 2019-05-09 MED ORDER — ISOSORBIDE MONONITRATE ER 60 MG PO TB24
30.0000 mg | ORAL_TABLET | Freq: Every day | ORAL | Status: DC
  Administered 2019-05-09 – 2019-05-15 (×6): 30 mg via ORAL
  Filled 2019-05-09 (×7): qty 1

## 2019-05-09 MED ORDER — SODIUM CHLORIDE 0.9% FLUSH
3.0000 mL | Freq: Two times a day (BID) | INTRAVENOUS | Status: DC
  Administered 2019-05-09 – 2019-05-17 (×16): 3 mL via INTRAVENOUS

## 2019-05-09 MED ORDER — ALBUTEROL SULFATE (2.5 MG/3ML) 0.083% IN NEBU
2.5000 mg | INHALATION_SOLUTION | Freq: Four times a day (QID) | RESPIRATORY_TRACT | Status: DC | PRN
  Administered 2019-05-10 – 2019-05-14 (×8): 2.5 mg via RESPIRATORY_TRACT
  Filled 2019-05-09 (×8): qty 3

## 2019-05-09 MED ORDER — SODIUM CHLORIDE 0.9 % IV SOLN: INTRAVENOUS | Status: DC

## 2019-05-09 MED ORDER — HEPARIN (PORCINE) 25000 UT/250ML-% IV SOLN
1100.0000 [IU]/h | INTRAVENOUS | Status: DC
  Administered 2019-05-09: 14:00:00 1100 [IU]/h via INTRAVENOUS
  Filled 2019-05-09: qty 250

## 2019-05-09 MED ORDER — ACETAMINOPHEN 325 MG PO TABS
650.0000 mg | ORAL_TABLET | ORAL | Status: DC | PRN
  Administered 2019-05-11: 15:00:00 650 mg via ORAL
  Filled 2019-05-09: qty 2

## 2019-05-09 MED ORDER — SODIUM CHLORIDE 0.9 % IV SOLN: 250.0000 mL | INTRAVENOUS | Status: DC | PRN

## 2019-05-09 MED ORDER — HEPARIN BOLUS VIA INFUSION
4000.0000 [IU] | Freq: Once | INTRAVENOUS | Status: AC
  Administered 2019-05-09: 14:00:00 4000 [IU] via INTRAVENOUS
  Filled 2019-05-09: qty 4000

## 2019-05-09 MED ORDER — FUROSEMIDE 10 MG/ML IJ SOLN
40.0000 mg | Freq: Two times a day (BID) | INTRAMUSCULAR | Status: DC
  Administered 2019-05-09 – 2019-05-10 (×2): 40 mg via INTRAVENOUS
  Filled 2019-05-09 (×2): qty 4

## 2019-05-09 MED ORDER — MOMETASONE FURO-FORMOTEROL FUM 200-5 MCG/ACT IN AERO
2.0000 | INHALATION_SPRAY | Freq: Two times a day (BID) | RESPIRATORY_TRACT | Status: DC
  Administered 2019-05-09 – 2019-05-17 (×15): 2 via RESPIRATORY_TRACT
  Filled 2019-05-09: qty 8.8

## 2019-05-09 MED ORDER — SODIUM CHLORIDE 0.9% FLUSH
3.0000 mL | Freq: Two times a day (BID) | INTRAVENOUS | Status: DC
  Administered 2019-05-09 – 2019-05-15 (×12): 3 mL via INTRAVENOUS

## 2019-05-09 MED ORDER — ONDANSETRON HCL 4 MG/2ML IJ SOLN: 4.0000 mg | Freq: Four times a day (QID) | INTRAMUSCULAR | Status: DC | PRN

## 2019-05-09 MED ORDER — ASPIRIN 81 MG PO CHEW
324.0000 mg | CHEWABLE_TABLET | ORAL | Status: AC
  Administered 2019-05-09: 15:00:00 324 mg via ORAL

## 2019-05-09 MED ORDER — POTASSIUM CHLORIDE CRYS ER 20 MEQ PO TBCR
40.0000 meq | EXTENDED_RELEASE_TABLET | Freq: Once | ORAL | Status: AC
  Administered 2019-05-09: 13:00:00 40 meq via ORAL
  Filled 2019-05-09: qty 2

## 2019-05-09 MED ORDER — ASPIRIN 81 MG PO CHEW
CHEWABLE_TABLET | ORAL | Status: AC
  Administered 2019-05-09: 15:00:00 324 mg via ORAL
  Filled 2019-05-09: qty 3

## 2019-05-09 SURGICAL SUPPLY — 9 items
CATH INFINITI 5FR ANG PIGTAIL (CATHETERS) ×3 IMPLANT
CATH INFINITI 5FR JL4 (CATHETERS) ×3 IMPLANT
CATH INFINITI JR4 5F (CATHETERS) ×3 IMPLANT
DEVICE CLOSURE MYNXGRIP 5F (Vascular Products) ×3 IMPLANT
KIT MANI 3VAL PERCEP (MISCELLANEOUS) ×3 IMPLANT
NEEDLE PERC 18GX7CM (NEEDLE) ×3 IMPLANT
PACK CARDIAC CATH (CUSTOM PROCEDURE TRAY) ×3 IMPLANT
SHEATH AVANTI 5FR X 11CM (SHEATH) ×3 IMPLANT
WIRE GUIDERIGHT .035X150 (WIRE) ×3 IMPLANT

## 2019-05-09 NOTE — ED Notes (Signed)
ED TO INPATIENT HANDOFF REPORT  ED Nurse Name and Phone #:  [phone]  S Name/Age/Gender Kristopher Fernandez 85 y.o. male Room/Bed: ED16A/ED16A  Code Status   Code Status: Prior  Home/SNF/Other Home Patient oriented to: self, place, time and situation Is this baseline? Yes   Triage Complete: Triage complete  Chief Complaint chest pain sob  Triage Note Pt here with c/o left arm pain and cp that began today, has had [facility] for the past few days, speaking in full sentences in triage, however, labored. Denies cough/congestion, hx of 6 stents-states this pain is the same.   Pt has fallen "a bunch" this past week due to dizziness, denies LOC-states he feels its from the [facility].    Allergies No Known Allergies  Level of Care/Admitting Diagnosis ED Disposition    ED Disposition Condition [facility] Area: [place] [[id number]]  Level of Care: Telemetry [5]  Covid Evaluation: N/A  Diagnosis: Chest pain [932355]  Admitting Physician: Saundra Shelling [[id number]]  Attending Physician: Saundra Shelling [[id number]]  PT Class (Do Not Modify): Observation [104]  PT Acc Code (Do Not Modify): Observation [10022]       B Medical/Surgery History Past Medical History:  Diagnosis Date  . Anginal pain ([place])   . Aortic stenosis 07/03/2014   Overview:  Mild with calculated aortic valve area of 1.05cm2  . Basal cell carcinoma, ear 03/31/2015  . Bradycardia 07/03/2014  . DDD (degenerative disc disease), lumbar 08/06/2015  . H/O cardiac catheterization 07/03/2014   Overview:  Cypher stent proximal and distal RCA 07/18/05 and TAXUS stent mid LAD 07/20/05 at [facility]  . HTN (hypertension) 07/03/2014  . Hyperlipidemia 07/03/2014  . Lumbosacral radiculopathy at S1 08/22/2015  . MI (myocardial infarction) ([place]) 07/03/2014   Overview:  Mi 07/17/05  . Myocardial infarction ([place])   . Normocytic normochromic anemia 09/14/2015  . Pacemaker 07/03/2014   Overview:  Dual chamber pacemaker  08/11/11  . Shortness of breath dyspnea   . SOB (shortness of breath) on exertion 05/13/2016   Past Surgical History:  Procedure Laterality Date  . CARDIAC CATHETERIZATION Bilateral 07/14/2016   Procedure: Right/Left Heart Cath and Coronary Angiography;  Surgeon: Isaias Cowman, MD;  Location: [place] [facility];  Service: Cardiovascular;  Laterality: Bilateral;  . CORONARY ANGIOPLASTY    . HERNIA REPAIR       A IV Location/Drains/Wounds Patient Lines/Drains/Airways Status   Active Line/Drains/Airways    Name:   Placement date:   Placement time:   Site:   Days:   Peripheral IV 05/09/19 Right Forearm   05/09/19    1150    Forearm   less than 1   Peripheral IV 05/09/19 Left Antecubital   05/09/19    1340    Antecubital   less than 1   Post Cath / Sheath   -    -    -      Post Cath / Sheath   -    -    -             Intake/Output Last 24 hours No intake or output data in the 24 hours ending 05/09/19 1410  Labs/Imaging Results for orders placed or performed during the hospital encounter of 05/09/19 (from the past 48 hour(s))  CBC     Status: Abnormal   Collection Time: 05/09/19 10:34 AM  Result Value Ref Range   WBC 7.2 4.0 - 10.5 K/uL   RBC 4.80  4.22 - 5.81 MIL/uL   Hemoglobin 13.8 13.0 - 17.0 g/dL   HCT 42.0 39.0 - 52.0 %   MCV 87.5 80.0 - 100.0 fL   MCH 28.8 26.0 - 34.0 pg   MCHC 32.9 30.0 - 36.0 g/dL   RDW 13.9 11.5 - 15.5 %   Platelets 146 (L) 150 - 400 K/uL   nRBC 0.0 0.0 - 0.2 %    Comment: Performed at [facility], [address], [place], [place] [address]  SARS Coronavirus 2 (CEPHEID - Performed in [place] hospital lab), Hosp Order     Status: None   Collection Time: 05/09/19 11:47 AM  Result Value Ref Range   SARS Coronavirus 2 NEGATIVE NEGATIVE    Comment: (NOTE) If result is NEGATIVE SARS-CoV-2 target nucleic acids are NOT DETECTED. The SARS-CoV-2 RNA is generally detectable in upper and lower  respiratory specimens during the acute  phase of infection. The lowest  concentration of SARS-CoV-2 viral copies this assay can detect is 250  copies / mL. A negative result does not preclude SARS-CoV-2 infection  and should not be used as the sole basis for treatment or other  patient management decisions.  A negative result may occur with  improper specimen collection / handling, submission of specimen other  than nasopharyngeal swab, presence of viral mutation(s) within the  areas targeted by this assay, and inadequate number of viral copies  (<250 copies / mL). A negative result must be combined with clinical  observations, patient history, and epidemiological information. If result is POSITIVE SARS-CoV-2 target nucleic acids are DETECTED. The SARS-CoV-2 RNA is generally detectable in upper and lower  respiratory specimens dur ing the acute phase of infection.  Positive  results are indicative of active infection with SARS-CoV-2.  Clinical  correlation with patient history and other diagnostic information is  necessary to determine patient infection status.  Positive results do  not rule out bacterial infection or co-infection with other viruses. If result is PRESUMPTIVE POSTIVE SARS-CoV-2 nucleic acids MAY BE PRESENT.   A presumptive positive result was obtained on the submitted specimen  and confirmed on repeat testing.  While 2019 novel coronavirus  (SARS-CoV-2) nucleic acids may be present in the submitted sample  additional confirmatory testing may be necessary for epidemiological  and / or clinical management purposes  to differentiate between  SARS-CoV-2 and other Sarbecovirus currently known to infect humans.  If clinically indicated additional testing with an alternate test  methodology [id number]) is advised. The SARS-CoV-2 RNA is generally  detectable in upper and lower respiratory sp ecimens during the acute  phase of infection. The expected result is Negative. Fact Sheet for Patients:   StrictlyIdeas.no Fact Sheet for Healthcare Providers: BankingDealers.co.za This test is not yet approved or cleared by the [place] FDA and has been authorized for detection and/or diagnosis of SARS-CoV-2 by FDA under an Emergency Use Authorization (EUA).  This EUA will remain in effect (meaning this test can be used) for the duration of the COVID-19 declaration under Section 564(b)(1) of the Act, 21 U.S.C. section 360bbb-3(b)(1), unless the authorization is terminated or revoked sooner. Performed at [facility], [address] [place], [place] [address]   Troponin I - ONCE - STAT     Status: Abnormal   Collection Time: 05/09/19 11:54 AM  Result Value Ref Range   Troponin I 0.08 (HH) <0.03 ng/mL    Comment: CRITICAL RESULT CALLED TO, READ BACK BY AND VERIFIED WITH Aliz Meritt AT 1221  ON 05/09/2019 [place]. Performed at [facility], [address] [place], [place] [address]   Comprehensive metabolic panel     Status: Abnormal   Collection Time: 05/09/19 11:54 AM  Result Value Ref Range   Sodium 136 135 - 145 mmol/L   Potassium 3.3 (L) 3.5 - 5.1 mmol/L   Chloride 101 98 - 111 mmol/L   CO2 27 22 - 32 mmol/L   Glucose, Bld 108 (H) 70 - 99 mg/dL   BUN 19 8 - 23 mg/dL   Creatinine, Ser 1.26 (H) 0.61 - 1.24 mg/dL   Calcium 8.6 (L) 8.9 - 10.3 mg/dL   Total Protein 6.0 (L) 6.5 - 8.1 g/dL   Albumin 3.1 (L) 3.5 - 5.0 g/dL   AST 22 15 - 41 U/L   ALT 18 0 - 44 U/L   Alkaline Phosphatase 97 38 - 126 U/L   Total Bilirubin 1.4 (H) 0.3 - 1.2 mg/dL   GFR calc non Af Amer 52 (L) >60 mL/min   GFR calc Af Amer >60 >60 mL/min   Anion gap 8 5 - 15    Comment: Performed at [facility], [address], [place], [place] [address]   Dg Chest 2 View  Result Date: 05/09/2019 CLINICAL DATA:  Chest pain, shortness of breath, and left arm pain beginning last night. Former smoker. EXAM: CHEST - 2 VIEW COMPARISON:  Chest  radiographs 12/26/2018 and CTA 12/27/2018 FINDINGS: A dual lead pacemaker remains in place. The cardiac silhouette is borderline enlarged. The lungs are less well inflated on the prior study and there is mild bibasilar opacity compatible with atelectasis. Small pleural effusions are noted. No edema or pneumothorax is identified. No acute osseous abnormality is seen. IMPRESSION: Small bilateral pleural effusions and mild bibasilar atelectasis. Electronically Signed   By: Logan Bores M.D.   On: 05/09/2019 11:42    Pending Labs Unresulted Labs (From admission, onward)    Start     Ordered   05/09/19 2200  Heparin level (unfractionated)  Once-Timed,   STAT     05/09/19 1407   05/09/19 1257  APTT  Add-on,   AD     05/09/19 1256   05/09/19 1257  Protime-INR  Add-on,   AD     05/09/19 1256   Signed and Held  Troponin I - Now Then Q6H  Now then every 6 hours,   STAT     Signed and Held   Signed and Held  Lipid panel  Tomorrow morning,   R     Signed and Held   Signed and Held  Basic metabolic panel  Tomorrow morning,   R     Signed and Held   Signed and Held  Novel Coronavirus, NAA (hospital order; send-out to ref lab)  Once,   R    Comments:  No isolation needed for this testing (if isolation ordered for another indication, maintain current isolation).   Question:  Pre-procedural testing  Answer:  Yes   Signed and Held          Vitals/Pain Today's Vitals   05/09/19 1329 05/09/19 1330 05/09/19 1330 05/09/19 1355  BP:  (!) 155/87  138/85  Pulse: 61 63  66  Resp: 19 (!) 21  (!) 24  Temp:      TempSrc:      SpO2: 100% 100%  99%  Weight:      PainSc:   0-No pain 0-No pain    Isolation Precautions No active  isolations  Medications Medications  nitroGLYCERIN 50 mg in dextrose 5 % 250 mL (0.2 mg/mL) infusion (5 mcg/min Intravenous New Bag/Given 05/09/19 1315)  heparin ADULT infusion 100 units/mL (25000 units/255mL sodium chloride 0.45%) (1,100 Units/hr Intravenous New Bag/Given  05/09/19 1357)  sodium chloride flush (NS) 0.9 % injection 3 mL (3 mLs Intravenous Not Given 05/09/19 1340)  nitroGLYCERIN (NITROSTAT) SL tablet 0.4 mg (0.4 mg Sublingual Given 05/09/19 1120)  nitroGLYCERIN (NITROGLYN) 2 % ointment 1 inch (1 inch Topical Given 05/09/19 1120)  potassium chloride SA (K-DUR) CR tablet 40 mEq (40 mEq Oral Given 05/09/19 1316)  heparin bolus via infusion 4,000 Units (4,000 Units Intravenous Bolus from Bag 05/09/19 1358)    Mobility walks with device High fall risk(yellow band applied in triage)   Focused Assessments Cardiac Assessment Handoff:  Cardiac Rhythm: Other (Comment)(Paced Rhythym PVC) Lab Results  Component Value Date   CKTOTAL 75 03/27/2014   CKMB 1.8 03/27/2014   TROPONINI 0.08 ([place]) 05/09/2019   No results found for: DDIMER Does the Patient currently have chest pain? No  , Neuro Assessment Handoff:  Swallow screen pass? Yes  Cardiac Rhythm: Other (Comment)(Paced Rhythym PVC)       Neuro Assessment: Within Defined Limits Neuro Checks:      Last Documented NIHSS Modified Score:   Has TPA been given? No If patient is a Neuro Trauma and patient is going to OR before floor call report to [place] nurse: [phone] or [phone]  , Pulmonary Assessment Handoff:  Lung sounds: L Breath Sounds: Diminished(Upper left lobe) O2 Device: Room Air        R Recommendations: See Admitting Provider Note  Report given to:   Additional Notes:  Patient has Nitoglycerin and Heparin infusing. He also has several stints.

## 2019-05-09 NOTE — ED Triage Notes (Signed)
Pt has fallen "a bunch" this past week due to dizziness, denies LOC-states he feels its from the [facility].

## 2019-05-09 NOTE — ED Triage Notes (Signed)
Pt here with c/o left arm pain and cp that began today, has had [facility] for the past few days, speaking in full sentences in triage, however, labored. Denies cough/congestion, hx of 6 stents-states this pain is the same.

## 2019-05-09 NOTE — Progress Notes (Signed)
ANTICOAGULATION CONSULT NOTE - Initial Consult  Pharmacy Consult for Heparin Indication: chest pain/ACS  No Known Allergies  Patient Measurements: Weight: 188 lb (85.3 kg) Heparin Dosing Weight: 85.3 kg  Vital Signs: Temp: 97.8 F (36.6 C) (05/20 1013) Temp Source: Oral (05/20 1013) BP: 132/84 (05/20 1300) Pulse Rate: 64 (05/20 1300)  Labs: Recent Labs    05/09/19 1034 05/09/19 1154  HGB 13.8  --   HCT 42.0  --   PLT 146*  --   CREATININE  --  1.26*  TROPONINI  --  0.08*    Estimated Creatinine Clearance: 47.9 mL/min (A) (by C-G formula based on SCr of 1.26 mg/dL (H)).   Medical History: Past Medical History:  Diagnosis Date  . Anginal pain ([place])   . Aortic stenosis 07/03/2014   Overview:  Mild with calculated aortic valve area of 1.05cm2  . Basal cell carcinoma, ear 03/31/2015  . Bradycardia 07/03/2014  . DDD (degenerative disc disease), lumbar 08/06/2015  . H/O cardiac catheterization 07/03/2014   Overview:  Cypher stent proximal and distal RCA 07/18/05 and TAXUS stent mid LAD 07/20/05 at [facility]  . HTN (hypertension) 07/03/2014  . Hyperlipidemia 07/03/2014  . Lumbosacral radiculopathy at S1 08/22/2015  . MI (myocardial infarction) ([place]) 07/03/2014   Overview:  Mi 07/17/05  . Myocardial infarction ([place])   . Normocytic normochromic anemia 09/14/2015  . Pacemaker 07/03/2014   Overview:  Dual chamber pacemaker 08/11/11  . Shortness of breath dyspnea   . SOB (shortness of breath) on exertion 05/13/2016    Assessment: Patient is a 85yo male admitted with chest pain. Pharmacy consulted for Heparin dosing. No prior anticoagulation listed in home medications.  Goal of Therapy:  Heparin level 0.3-0.7 units/ml Monitor platelets by anticoagulation protocol: Yes   Plan:  Give 4000 units bolus x 1 Start heparin infusion at 1100 units/hr Check anti-Xa level in 8 hours and daily while on heparin Continue to monitor H&H and platelets  Paulina Fusi, PharmD, BCPS 05/09/2019 1:28  PM

## 2019-05-09 NOTE — ED Notes (Signed)
Attempted to call report. Caryl Pina states she will call back in 5 minutes.

## 2019-05-09 NOTE — Progress Notes (Signed)
Advanced care plan. Purpose of the Encounter: CODE STATUS Parties in Attendance: Patient Patient's Decision Capacity: Good Subjective/Patient's story: Kristopher Fernandez  is a 85 y.o. male with a known history of aortic stenosis, basal cell carcinoma of the ear, hypertension, hyperlipidemia, myocardial infarction, coronary disease, cardiac stent presented to the emergency room for chest pain.  Patient chest pain started last night and radiated to the left arm.  Pain is sharp in nature 7 out of 10 on a scale of 1-10.  No complaints of any fever, chills, cough.  No recent travel, sick contacts.  COVID-19 test is negative.  Patient's first set of troponin  0.08, patient was started on heparin drip in the emergency room Objective/Medical story Patient needs cardiology evaluation, troponin check and possible intervention Goals of care determination:  Advance care directives and goals of care discussed with patient Patient for now wants everything done which includes cpr and intubation and ventilator if need arises. CODE STATUS: Full code Time spent discussing advanced care planning: 16 minutes

## 2019-05-09 NOTE — ED Provider Notes (Signed)
[facility] Emergency Department Provider Note       Time seen: ----------------------------------------- 10:37 AM on 05/09/2019 -----------------------------------------   I have reviewed the triage vital signs and the nursing notes.  HISTORY   Chief Complaint Chest Pain; Shortness of Breath; Arm Pain; and Dizziness    HPI Kristopher Fernandez is a 85 y.o. male with a history of degenerative disc disease, coronary artery disease, hyperlipidemia, MI, dyspnea who presents to the ED for dizziness with frequent falls.  He denies any loss of consciousness.  Patient feels like he is falling from his shortness of breath and he is short of breath at rest.  He complains of left arm pain and chest pain that began today.  He denies any cough, congestion or flulike symptoms.  Patient describes pain down his left arm like before his prior stents.  Past Medical History:  Diagnosis Date  . Anginal pain ([place])   . Aortic stenosis 07/03/2014   Overview:  Mild with calculated aortic valve area of 1.05cm2  . Basal cell carcinoma, ear 03/31/2015  . Bradycardia 07/03/2014  . DDD (degenerative disc disease), lumbar 08/06/2015  . H/O cardiac catheterization 07/03/2014   Overview:  Cypher stent proximal and distal RCA 07/18/05 and TAXUS stent mid LAD 07/20/05 at [facility]  . HTN (hypertension) 07/03/2014  . Hyperlipidemia 07/03/2014  . Lumbosacral radiculopathy at S1 08/22/2015  . MI (myocardial infarction) ([place]) 07/03/2014   Overview:  Mi 07/17/05  . Myocardial infarction ([place])   . Normocytic normochromic anemia 09/14/2015  . Pacemaker 07/03/2014   Overview:  Dual chamber pacemaker 08/11/11  . Shortness of breath dyspnea   . SOB (shortness of breath) on exertion 05/13/2016    Patient Active Problem List   Diagnosis Date Noted  . Chest pain 12/26/2018  . Ascending aortic aneurysm ([place]) 02/09/2018  . Status post cardiac catheterization 07/14/2016  . SOB (shortness of breath) on exertion  05/13/2016  . 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 Normocytic normochromic anemia 09/14/2015  . Lumbosacral radiculopathy at S1 08/22/2015  . DDD (degenerative disc disease), lumbar 08/06/2015  . Basal cell carcinoma, ear 03/31/2015  . History of malignant neoplasm of skin 11/18/2014  . Aortic stenosis 07/03/2014  . Aortic valve stenosis 07/03/2014  . Bradycardia 07/03/2014  . H/O cardiac catheterization 07/03/2014  . HTN (hypertension) 07/03/2014  . Hyperlipidemia 07/03/2014  . Hypertension 07/03/2014  . MI (myocardial infarction) ([place]) 07/03/2014  . Pacemaker 07/03/2014  . Shoulder pain 11/20/2013  . Pain in joint involving pelvic region and thigh 11/27/2012  . Trochanteric bursitis 11/27/2012    Past Surgical History:  Procedure Laterality Date  . CARDIAC CATHETERIZATION Bilateral 07/14/2016   Procedure: Right/Left Heart Cath and Coronary Angiography;  Surgeon: Isaias Cowman, MD;  Location: [place] [facility];  Service: Cardiovascular;  Laterality: Bilateral;  . CORONARY ANGIOPLASTY    . HERNIA REPAIR      Allergies Patient has no known allergies.  Social History Social History   Tobacco Use  . Smoking status: Former Smoker    Years: 10.00    Types: Cigarettes  . Smokeless tobacco: Former Network engineer Use Topics  . Alcohol use: No  . Drug use: No   Review of Systems Constitutional: Negative for fever. Cardiovascular: Positive for chest pain Respiratory: Positive for shortness of breath Gastrointestinal: Negative for abdominal pain, vomiting and diarrhea. Musculoskeletal: Positive for left arm pain Skin: Negative for rash. Neurological: Positive for dizziness  All systems negative/normal/unremarkable except  as stated in the HPI  ____________________________________________   PHYSICAL EXAM:  VITAL SIGNS: ED Triage Vitals  Enc Vitals Group     BP 05/09/19 1013 (!) 142/86     Pulse Rate 05/09/19 1013 76      Resp 05/09/19 1013 (!) 36     Temp 05/09/19 1013 97.8 F (36.6 C)     Temp Source 05/09/19 1013 Oral     SpO2 05/09/19 1013 98 %     Weight --      Height --      Head Circumference --      Peak Flow --      Pain Score 05/09/19 1016 9     Pain Loc --      Pain Edu? --      Excl. in [place]? --    Constitutional: Alert and oriented. Well appearing and in no distress. Eyes: Conjunctivae are normal. Normal extraocular movements. ENT      Head: Normocephalic and atraumatic.      Nose: No congestion/rhinnorhea.      Mouth/Throat: Mucous membranes are moist.      Neck: No stridor. Cardiovascular: Normal rate, regular rhythm. No murmurs, rubs, or gallops. Respiratory: Normal respiratory effort without tachypnea nor retractions. Breath sounds are clear and equal bilaterally. No wheezes/rales/rhonchi. Gastrointestinal: Soft and nontender. Normal bowel sounds Musculoskeletal: Nontender with normal range of motion in extremities. No lower extremity tenderness nor edema. Neurologic:  Normal speech and language. No gross focal neurologic deficits are appreciated.  Skin:  Skin is warm, dry and intact. No rash noted. Psychiatric: Mood and affect are normal. Speech and behavior are normal.  ____________________________________________  EKG: Interpreted by me.  AV dual paced rhythm with prolonged AV conduction, frequent ventricular paced complexes with a rate of 68 bpm  ____________________________________________  ED COURSE:  As part of my medical decision making, I reviewed the following data within the [place] History obtained from family if available, nursing notes, old chart and ekg, as well as notes from prior ED visits. Patient presented for shortness of breath and chest pain, we will assess with labs and imaging as indicated at this time.   Procedures  Kristopher Fernandez was evaluated in Emergency Department on 05/09/2019 for the symptoms described in the history of  present illness. He was evaluated in the context of the global COVID-19 pandemic, which necessitated consideration that the patient might be at risk for infection with the SARS-CoV-2 virus that causes COVID-19. Institutional protocols and algorithms that pertain to the evaluation of patients at risk for COVID-19 are in a state of rapid change based on information released by regulatory bodies including the CDC and federal and state organizations. These policies and algorithms were followed during the patient's care in the ED.  ____________________________________________   LABS (pertinent positives/negatives)  Labs Reviewed  CBC - Abnormal; Notable for the following components:      Result Value   Platelets 146 (*)    All other components within normal limits  TROPONIN I - Abnormal; Notable for the following components:   Troponin I 0.08 (*)    All other components within normal limits  COMPREHENSIVE METABOLIC PANEL - Abnormal; Notable for the following components:   Potassium 3.3 (*)    Glucose, Bld 108 (*)    Creatinine, Ser 1.26 (*)    Calcium 8.6 (*)    Total Protein 6.0 (*)    Albumin 3.1 (*)    Total Bilirubin 1.4 (*)  GFR calc non Af Amer 52 (*)    All other components within normal limits  SARS CORONAVIRUS 2 (HOSPITAL ORDER, [place] [facility])    RADIOLOGY Images were viewed by me  Chest x-ray IMPRESSION: Small bilateral pleural effusions and mild bibasilar atelectasis.  ____________________________________________   DIFFERENTIAL DIAGNOSIS   Unstable angina, MI, PE, pneumothorax, arrhythmia  FINAL ASSESSMENT AND PLAN  NSTEMI   Plan: The patient had presented for chest pain that radiates down his left arm and persistent shortness of breath. Patient's labs did indicate an end STEMI likely. Patient's imaging does reveal small bilateral pleural effusions.  Due to his ongoing chest pain we have tried nitroglycerin orally without much improvement.   I will place him on a nitroglycerin drip as well as place him on a heparin drip.  I talk with Dr. Nehemiah Massed who will see the patient in consultation.  I have discussed with the hospitalist for admission.   Laurence Aly, MD    Note: This note was generated in part or whole with voice recognition software. Voice recognition is usually quite accurate but there are transcription errors that can and very often do occur. I apologize for any typographical errors that were not detected and corrected.     Earleen Newport, MD 05/09/19 [phone]

## 2019-05-09 NOTE — H&P (Signed)
[facility] [place] NAME: Kristopher Fernandez    MR#:  [id number]  DATE OF BIRTH:  1932/04/28  DATE OF ADMISSION:  05/09/2019  PRIMARY CARE PHYSICIAN: Glendon Axe, MD   REQUESTING/REFERRING PHYSICIAN:   CHIEF COMPLAINT:   Chief Complaint  Patient presents with  . Chest Pain  . Shortness of Breath  . Arm Pain  . Dizziness    HISTORY OF PRESENT ILLNESS: Kristopher Fernandez  is a 85 y.o. male with a known history of aortic stenosis, basal cell carcinoma of the ear, hypertension, hyperlipidemia, myocardial infarction, coronary disease, cardiac stent presented to the emergency room for chest pain.  Patient chest pain started last night and radiated to the left arm.  Pain is sharp in nature 7 out of 10 on a scale of 1-10.  No complaints of any fever, chills, cough.  No recent travel, sick contacts.  COVID-19 test is negative.  Patient's first set of troponin  0.08, patient was started on heparin drip in the emergency room.  PAST MEDICAL HISTORY:   Past Medical History:  Diagnosis Date  . Anginal pain ([place])   . Aortic stenosis 07/03/2014   Overview:  Mild with calculated aortic valve area of 1.05cm2  . Basal cell carcinoma, ear 03/31/2015  . Bradycardia 07/03/2014  . DDD (degenerative disc disease), lumbar 08/06/2015  . H/O cardiac catheterization 07/03/2014   Overview:  Cypher stent proximal and distal RCA 07/18/05 and TAXUS stent mid LAD 07/20/05 at [facility]  . HTN (hypertension) 07/03/2014  . Hyperlipidemia 07/03/2014  . Lumbosacral radiculopathy at S1 08/22/2015  . MI (myocardial infarction) ([place]) 07/03/2014   Overview:  Mi 07/17/05  . Myocardial infarction ([place])   . Normocytic normochromic anemia 09/14/2015  . Pacemaker 07/03/2014   Overview:  Dual chamber pacemaker 08/11/11  . Shortness of breath dyspnea   . SOB (shortness of breath) on exertion 05/13/2016    PAST SURGICAL HISTORY:  Past Surgical History:  Procedure Laterality Date  . CARDIAC  CATHETERIZATION Bilateral 07/14/2016   Procedure: Right/Left Heart Cath and Coronary Angiography;  Surgeon: Isaias Cowman, MD;  Location: [place] [facility];  Service: Cardiovascular;  Laterality: Bilateral;  . CORONARY ANGIOPLASTY    . HERNIA REPAIR      SOCIAL HISTORY:  Social History   Tobacco Use  . Smoking status: Former Smoker    Years: 10.00    Types: Cigarettes  . Smokeless tobacco: Former Network engineer Use Topics  . Alcohol use: No    FAMILY HISTORY:  Family History  Problem Relation Age of Onset  . Bladder Cancer Neg Hx   . Kidney cancer Neg Hx   . Prostate cancer Neg Hx     DRUG ALLERGIES: No Known Allergies  REVIEW OF SYSTEMS:   CONSTITUTIONAL: No fever, fatigue or weakness.  EYES: No blurred or double vision.  EARS, NOSE, AND THROAT: No tinnitus or ear pain.  RESPIRATORY: No cough, shortness of breath, wheezing or hemoptysis.  CARDIOVASCULAR: Has chest pain, no orthopnea, edema.  GASTROINTESTINAL: No nausea, vomiting, diarrhea or abdominal pain.  GENITOURINARY: No dysuria, hematuria.  ENDOCRINE: No polyuria, nocturia,  HEMATOLOGY: No anemia, easy bruising or bleeding SKIN: No rash or lesion. MUSCULOSKELETAL: No joint pain or arthritis.   NEUROLOGIC: No tingling, numbness, weakness.  PSYCHIATRY: No anxiety or depression.   MEDICATIONS AT HOME:  Prior to Admission medications   Medication Sig Start Date End Date Taking? Authorizing Provider  albuterol [facility] HFA) 108 (90 Base)  MCG/ACT inhaler INHALE TWO INHALATIONS INTO THE LUNGS EVERY SIX HOURS AS NEEDED FOR WHEEZING 12/23/17   [provider]  aspirin EC 81 MG tablet Take 81 mg by mouth daily.    [provider]  atorvastatin (LIPITOR) 80 MG tablet Take 80 mg by mouth daily.    [provider]  budesonide-formoterol (SYMBICORT) 160-4.5 MCG/ACT inhaler Inhale 2 puffs into the lungs 2 (two) times daily. 04/18/18 04/18/19  [provider]  clopidogrel (PLAVIX) 75  MG tablet Take 75 mg by mouth daily.    [provider]  ipratropium-albuterol (DUONEB) 0.5-2.5 (3) MG/3ML SOLN Inhale 3 mLs into the lungs every 4 (four) hours as needed.  02/06/18 02/01/19  [provider]  isosorbide mononitrate (IMDUR) 60 MG 24 hr tablet Take 0.5 tablets (30 mg total) by mouth daily. 12/29/18   Salary, Avel Peace, MD  nitroGLYCERIN (NITROSTAT) 0.6 MG SL tablet Place 1 tablet (0.6 mg total) under the tongue every 5 (five) minutes as needed for chest pain. 12/28/18   Salary, Holly Bodily D, MD  ramipril (ALTACE) 5 MG capsule Take 5 mg by mouth daily.    [provider]      PHYSICAL EXAMINATION:   VITAL SIGNS: Blood pressure 132/84, pulse 64, temperature 97.8 F (36.6 C), temperature source Oral, resp. rate 16, weight 85.3 kg, SpO2 100 %.  GENERAL:  84 y.o.-year-old patient lying in the bed with no acute distress.  EYES: Pupils equal, round, reactive to light and accommodation. No scleral icterus. Extraocular muscles intact.  HEENT: Head atraumatic, normocephalic. Oropharynx and nasopharynx clear.  NECK:  Supple, no jugular venous distention. No thyroid enlargement, no tenderness.  LUNGS: Normal breath sounds bilaterally, no wheezing, rales,rhonchi or crepitation. No use of accessory muscles of respiration.  CARDIOVASCULAR: S1, S2 normal. No murmurs, rubs, or gallops.  ABDOMEN: Soft, nontender, nondistended. Bowel sounds present. No organomegaly or mass.  EXTREMITIES: No pedal edema, cyanosis, or clubbing.  NEUROLOGIC: Cranial nerves II through XII are intact. Muscle strength 5/5 in all extremities. Sensation intact. Gait not checked.  PSYCHIATRIC: The patient is alert and oriented x 3.  SKIN: No obvious rash, lesion, or ulcer.   LABORATORY PANEL:   CBC Recent Labs  Lab 05/09/19 1034  WBC 7.2  HGB 13.8  HCT 42.0  PLT 146*  MCV 87.5  MCH 28.8  MCHC 32.9  RDW 13.9    ------------------------------------------------------------------------------------------------------------------  Chemistries  Recent Labs  Lab 05/09/19 1154  NA 136  K 3.3*  CL 101  CO2 27  GLUCOSE 108*  BUN 19  CREATININE 1.26*  CALCIUM 8.6*  AST 22  ALT 18  ALKPHOS 97  BILITOT 1.4*   ------------------------------------------------------------------------------------------------------------------ estimated creatinine clearance is 47.9 mL/min (A) (by C-G formula based on SCr of 1.26 mg/dL (H)). ------------------------------------------------------------------------------------------------------------------ No results for input(s): TSH, T4TOTAL, T3FREE, THYROIDAB in the last 72 hours.  Invalid input(s): FREET3   Coagulation profile No results for input(s): INR, PROTIME in the last 168 hours. ------------------------------------------------------------------------------------------------------------------- No results for input(s): DDIMER in the last 72 hours. -------------------------------------------------------------------------------------------------------------------  Cardiac Enzymes Recent Labs  Lab 05/09/19 1154  TROPONINI 0.08*   ------------------------------------------------------------------------------------------------------------------ Invalid input(s): POCBNP  ---------------------------------------------------------------------------------------------------------------  Urinalysis    Component Value Date/Time   COLORURINE YELLOW (A) 10/08/2016 1209   APPEARANCEUR Clear 10/15/2016 1032   LABSPEC 1.036 (H) 10/08/2016 1209   PHURINE 6.0 10/08/2016 1209   GLUCOSEU Negative 10/15/2016 1032   HGBUR 2+ (A) 10/08/2016 1209   BILIRUBINUR Negative 10/15/2016 [place] 10/08/2016 1209  PROTEINUR Negative 10/15/2016 [place] 10/08/2016 1209   NITRITE Negative 10/15/2016 1032   NITRITE NEGATIVE 10/08/2016 1209    LEUKOCYTESUR Trace (A) 10/15/2016 1032     RADIOLOGY: Dg Chest 2 View  Result Date: 05/09/2019 CLINICAL DATA:  Chest pain, shortness of breath, and left arm pain beginning last night. Former smoker. EXAM: CHEST - 2 VIEW COMPARISON:  Chest radiographs 12/26/2018 and CTA 12/27/2018 FINDINGS: A dual lead pacemaker remains in place. The cardiac silhouette is borderline enlarged. The lungs are less well inflated on the prior study and there is mild bibasilar opacity compatible with atelectasis. Small pleural effusions are noted. No edema or pneumothorax is identified. No acute osseous abnormality is seen. IMPRESSION: Small bilateral pleural effusions and mild bibasilar atelectasis. Electronically Signed   By: Logan Bores M.D.   On: 05/09/2019 11:42    EKG: Orders placed or performed during the hospital encounter of 05/09/19  . EKG 12-Lead  . EKG 12-Lead  . ED EKG  . ED EKG    IMPRESSION AND PLAN: 85 yr old male patient with a known history of aortic stenosis, basal cell carcinoma of the ear, hypertension, hyperlipidemia, myocardial infarction, coronary disease, cardiac stent presented to the emergency room for chest pain.    -Unstable angina Admit patient to telemetry observation bed IV heparin drip for anticoagulation Cardiology consultation  cycle troponin and check echocardiogram  -Coronary artery disease with history of stent Resume aspirin, Plavix and statin medication Continue nitrates  -Hyperlipidemia Currently on high intensity statin  -Hypertension Continue ACE inhibitor  -DVT prophylaxis On heparin drip for anticoagulation  All the records are reviewed and case discussed with ED provider. Management plans discussed with the patient, family and they are in agreement.  CODE STATUS:Full code Code Status History    Date Active Date Inactive Code Status Order ID Comments User Context   12/26/2018 2040 12/28/2018 1604 Full Code [id number]  Saundra Shelling, MD Inpatient       TOTAL TIME TAKING CARE OF THIS PATIENT: 53 minutes.    Saundra Shelling M.D on 05/09/2019 at 1:21 PM  Between 7am to 6pm - Pager - [phone]  After 6pm go to [email or web address] - password [email or web address] [facility]  [facility]  Office  [phone]  CC: Primary care physician; Glendon Axe, MD

## 2019-05-09 NOTE — ED Notes (Signed)
Stephanie on floor asked if Nitro drip could be discontinued. MD stated that since the Nitro drip was the treatment that provided adequate relief from chest pain, he did not want to D/C it. Colletta [place] requested five more minutes to assign a new room for the patient and then a nurse will take report.

## 2019-05-09 NOTE — ED Notes (Signed)
Spoke to [place] in specials. Patient is to go to room 6 in specials, then he will be taken to room 243. Floor notified.

## 2019-05-09 NOTE — Consult Note (Signed)
[facility] Cardiology Consultation Note  Patient ID: Kristopher Fernandez, MRN: [id number], DOB/AGE: Apr 06, 1932 85 y.o. Admit date: 05/09/2019   Date of Consult: 05/09/2019 Primary Physician: Glendon Axe, MD Primary Cardiologist: Paras[place]  Chief Complaint:  Chief Complaint  Patient presents with  . Chest Pain  . Shortness of Breath  . Arm Pain  . Dizziness   Reason for Consult: Chest pain  HPI: 85 y.o. male with known coronary disease status post previous myocardial infarction and multiple stents in the past the last of which was approximately 4 years ago.  He has had a stent in the left anterior descending artery obtuse marginal and multiple stents from the ostium to the distal portion of the right coronary artery.  The patient has had known hypertension hyperlipidemia on appropriate medication management.  Recently he has had some left-sided chest discomfort waxing and waning significantly over the last several weeks now to the point where it is unstable angina and unable to leave with rest.  He has seen in the emergency room with an EKG showing paced atrial rhythm with ventricular response and no other EKG changes.  Troponin is 0.08 and the patient continues to have chest pain but slightly relieved by nitroglycerin.  He claims that this is significantly consistent with his previous chest pain prior to his previous stenting.  Currently he is hemodynamically stable  Past Medical History:  Diagnosis Date  . Anginal pain ([place])   . Aortic stenosis 07/03/2014   Overview:  Mild with calculated aortic valve area of 1.05cm2  . Basal cell carcinoma, ear 03/31/2015  . Bradycardia 07/03/2014  . DDD (degenerative disc disease), lumbar 08/06/2015  . H/O cardiac catheterization 07/03/2014   Overview:  Cypher stent proximal and distal RCA 07/18/05 and TAXUS stent [facility]AD [facility]  . HTN (hypertension) 07/03/2014  . Hyperlipidemia 07/03/2014  . Lumbosacral radiculopathy at S1 08/22/2015  . MI  (myocardial infarction) ([place]) 07/03/2014   Overview:  Mi 07/17/05  . Myocardial infarction ([place])   . Normocytic normochromic anemia 09/14/2015  . Pacemaker 07/03/2014   Overview:  Dual chamber pacemaker 08/11/11  . Shortness of breath dyspnea   . SOB (shortness of breath) on exertion 05/13/2016      Surgical History:  Past Surgical History:  Procedure Laterality Date  . CARDIAC CATHETERIZATION Bilateral 07/14/2016   Procedure: Right/Left Heart Cath and Coronary Angiography;  Surgeon: Isaias Cowman, MD;  Location: [place] [facility];  Service: Cardiovascular;  Laterality: Bilateral;  . CORONARY ANGIOPLASTY    . HERNIA REPAIR       Home Meds: Prior to Admission medications   Medication Sig Start Date End Date Taking? Authorizing Provider  albuterol (PROAIR HFA) 108 (90 Base) MCG/ACT inhaler INHALE TWO INHALATIONS INTO THE LUNGS EVERY SIX HOURS AS NEEDED FOR WHEEZING 12/23/17   [provider]  aspirin EC 81 MG tablet Take 81 mg by mouth daily.    [provider]  atorvastatin (LIPITOR) 80 MG tablet Take 80 mg by mouth daily.    [provider]  budesonide-formoterol (SYMBICORT) 160-4.5 MCG/ACT inhaler Inhale 2 puffs into the lungs 2 (two) times daily. 04/18/18 04/18/19  [provider]  clopidogrel (PLAVIX) 75 MG tablet Take 75 mg by mouth daily.    [provider]  ipratropium-albuterol (DUONEB) 0.5-2.5 (3) MG/3ML SOLN Inhale 3 mLs into the lungs every 4 (four) hours as needed.  02/06/18 02/01/19  [provider]  isosorbide mononitrate (IMDUR) 60 MG 24 hr tablet Take 0.5  tablets (30 mg total) by mouth daily. 12/29/18   Salary, Avel Peace, MD  nitroGLYCERIN (NITROSTAT) 0.6 MG SL tablet Place 1 tablet (0.6 mg total) under the tongue every 5 (five) minutes as needed for chest pain. 12/28/18   Salary, Holly Bodily D, MD  ramipril (ALTACE) 5 MG capsule Take 5 mg by mouth daily.    [provider]    Inpatient Medications:  . heparin   4,000 Units Intravenous Once   . heparin    . nitroGLYCERIN 5 mcg/min (05/09/19 1315)    Allergies: No Known Allergies  Social History   Socioeconomic History  . Marital status: Married    Spouse name: Not on file  . Number of children: Not on file  . Years of education: Not on file  . Highest education level: Not on file  Occupational History  . Not on file  Social Needs  . Financial resource strain: Not on file  . Food insecurity:    Worry: Not on file    Inability: Not on file  . Transportation needs:    Medical: Not on file    Non-medical: Not on file  Tobacco Use  . Smoking status: Former Smoker    Years: 10.00    Types: Cigarettes  . Smokeless tobacco: Former Network engineer and Sexual Activity  . Alcohol use: No  . Drug use: No  . Sexual activity: Not on file  Lifestyle  . Physical activity:    Days per week: Not on file    Minutes per session: Not on file  . Stress: Not on file  Relationships  . Social connections:    Talks on phone: Not on file    Gets together: Not on file    Attends religious service: Not on file    Active member of club or organization: Not on file    Attends meetings of clubs or organizations: Not on file    Relationship status: Not on file  . Intimate partner violence:    Fear of current or ex partner: Not on file    Emotionally abused: Not on file    Physically abused: Not on file    Forced sexual activity: Not on file  Other Topics Concern  . Not on file  Social History Narrative  . Not on file     Family History  Problem Relation Age of Onset  . Bladder Cancer Neg Hx   . Kidney cancer Neg Hx   . Prostate cancer Neg Hx      Review of Systems Positive for chest pain Negative for: General:  chills, fever, night sweats or weight changes.  Cardiovascular: PND orthopnea syncope dizziness  Dermatological skin lesions rashes Respiratory: Cough congestion Urologic: Frequent urination urination at night and  hematuria Abdominal: negative for nausea, vomiting, diarrhea, bright red blood per rectum, melena, or hematemesis Neurologic: negative for visual changes, and/or hearing changes  All other systems reviewed and are otherwise negative except as noted above.  Labs: Recent Labs    05/09/19 1154  TROPONINI 0.08*   Lab Results  Component Value Date   WBC 7.2 05/09/2019   HGB 13.8 05/09/2019   HCT 42.0 05/09/2019   MCV 87.5 05/09/2019   PLT 146 (L) 05/09/2019    Recent Labs  Lab 05/09/19 1154  NA 136  K 3.3*  CL 101  CO2 27  BUN 19  CREATININE 1.26*  CALCIUM 8.6*  PROT 6.0*  BILITOT 1.4*  ALKPHOS 97  ALT 18  AST 22  GLUCOSE 108*   Lab Results  Component Value Date   CHOL 104 12/27/2018   HDL 46 12/27/2018   LDLCALC 43 12/27/2018   TRIG 75 12/27/2018   No results found for: DDIMER  Radiology/Studies:  Dg Chest 2 View  Result Date: 05/09/2019 CLINICAL DATA:  Chest pain, shortness of breath, and left arm pain beginning last night. Former smoker. EXAM: CHEST - 2 VIEW COMPARISON:  Chest radiographs 12/26/2018 and CTA 12/27/2018 FINDINGS: A dual lead pacemaker remains in place. The cardiac silhouette is borderline enlarged. The lungs are less well inflated on the prior study and there is mild bibasilar opacity compatible with atelectasis. Small pleural effusions are noted. No edema or pneumothorax is identified. No acute osseous abnormality is seen. IMPRESSION: Small bilateral pleural effusions and mild bibasilar atelectasis. Electronically Signed   By: Logan Bores M.D.   On: 05/09/2019 11:42    EKG: Paced atrial rhythm with ventricular response and nonspecific ST changes  Weights: Filed Weights   05/09/19 1259  Weight: 85.3 kg     Physical Exam: Blood pressure (!) 152/92, pulse 61, temperature 97.8 F (36.6 C), temperature source Oral, resp. rate 19, weight 85.3 kg, SpO2 100 %. Body mass index is 25.5 kg/m. General: Well developed, well nourished, in no acute  distress. Head eyes ears nose throat: Normocephalic, atraumatic, sclera non-icteric, no xanthomas, nares are without discharge. No apparent thyromegaly and/or mass  Lungs: Normal respiratory effort.  no wheezes, no rales, no rhonchi.  Heart: RRR with normal S1 S2. no murmur gallop, no rub, PMI is normal size and placement, carotid upstroke normal without bruit, jugular venous pressure is normal Abdomen: Soft, non-tender, non-distended with normoactive bowel sounds. No hepatomegaly. No rebound/guarding. No obvious abdominal masses. Abdominal aorta is normal size without bruit Extremities: No edema. no cyanosis, no clubbing, no ulcers  Peripheral : 2+ bilateral upper extremity pulses, 2+ bilateral femoral pulses, 2+ bilateral dorsal pedal pulse Neuro: Alert and oriented. No facial asymmetry. No focal deficit. Moves all extremities spontaneously. Musculoskeletal: Normal muscle tone without kyphosis Psych:  Responds to questions appropriately with a normal affect.    Assessment: 85 year old male with known cardiovascular disease hypertension hyperlipidemia previous myocardial infarction with multiple stents having unstable angina and possible non-ST elevation myocardial infarction with elevated troponin most consistent with coronary artery atherosclerosis progression  Plan: 1.  Heparin for further risk reduction and myocardial infarction and continue antiplatelet therapy 2.  Continuation of high intensity cholesterol therapy 3.  Beta-blocker for hypertension control and further risk reduction of cardiovascular event 4.  Proceed to cardiac catheterization to assess coronary anatomy and further treatment thereof is necessary.  Patient understands the risk and benefits of cardiac catheterization.  This includes the possibility of death stroke heart attack infection bleeding or blood clot.  He is at low risk for conscious sedation  Signed, Corey Skains M.D. [facility]  Cardiology 05/09/2019, 1:31 PM

## 2019-05-09 NOTE — Progress Notes (Signed)
[facility] Cardiology [facility] Encounter Note  Patient: Kristopher Fernandez / Admit Date: 05/09/2019 / Date of Encounter: 05/09/2019, 4:47 PM   Subjective: Patient is still severely short of breath with some pleural effusion and possible mild pulmonary vascular congestion but not hypoxic at this point.  Minimal chest discomfort and pressure.  EKG and telemetry show paced atrial rhythm with very frequent preventricular contractions The catheterization showing severe LV systolic dysfunction and severe enlargement with ejection fraction of 10 to 15% Coronary arteries show patent stents with no evidence of critical coronary artery atherosclerosis requiring further intervention  Review of Systems: Positive for: Shortness of breath Negative for: Vision change, hearing change, syncope, dizziness, nausea, vomiting,diarrhea, bloody stool, stomach pain, cough, congestion, diaphoresis, urinary frequency, urinary pain,skin lesions, skin rashes Others previously listed  Objective: Telemetry: Normal sinus rhythm with very frequent preventricular contractions Physical Exam: Blood pressure (!) 161/107, pulse 72, temperature 97.8 F (36.6 C), temperature source Oral, resp. rate (!) 22, height 6' (1.829 m), weight 85.3 kg, SpO2 97 %. Body mass index is 25.5 kg/m. General: Well developed, well nourished, in no acute distress. Head: Normocephalic, atraumatic, sclera non-icteric, no xanthomas, nares are without discharge. Neck: No apparent masses Lungs: Normal respirations with some wheezes, no rhonchi, no rales , few basilar crackles   Heart: Regular rate and rhythm, normal S1 S2, no murmur, no rub, no gallop, PMI is normal size and placement, carotid upstroke normal without bruit, jugular venous pressure normal Abdomen: Soft, non-tender, non-distended with normoactive bowel sounds. No hepatosplenomegaly. Abdominal aorta is normal size without bruit Extremities: Trace edema, no clubbing, no cyanosis, no ulcers,   Peripheral: 2+ radial, 2+ femoral, 2+ dorsal pedal pulses Neuro: Alert and oriented. Moves all extremities spontaneously. Psych:  Responds to questions appropriately with a normal affect.  No intake or output data in the 24 hours ending 05/09/19 1647  Inpatient Medications:  . aspirin      . [START ON 05/10/2019] aspirin  81 mg Oral Pre-Cath  . [MAR Hold] aspirin EC  81 mg Oral Daily  . [MAR Hold] aspirin EC  81 mg Oral Daily  . [MAR Hold] atorvastatin  80 mg Oral Daily  . [MAR Hold] clopidogrel  75 mg Oral Daily  . [MAR Hold] isosorbide mononitrate  30 mg Oral Daily  . [MAR Hold] mometasone-formoterol  2 puff Inhalation BID  . [MAR Hold] ramipril  5 mg Oral Daily  . [MAR Hold] sodium chloride flush  3 mL Intravenous Q12H  . [MAR Hold] sodium chloride flush  3 mL Intravenous Q12H   Infusions:  . [MAR Hold] sodium chloride    . sodium chloride    . sodium chloride    . heparin Stopped (05/09/19 1600)  . nitroGLYCERIN 5 mcg/min (05/09/19 1315)    Labs: Recent Labs    05/09/19 1154  NA 136  K 3.3*  CL 101  CO2 27  GLUCOSE 108*  BUN 19  CREATININE 1.26*  CALCIUM 8.6*   Recent Labs    05/09/19 1154  AST 22  ALT 18  ALKPHOS 97  BILITOT 1.4*  PROT 6.0*  ALBUMIN 3.1*   Recent Labs    05/09/19 1034  WBC 7.2  HGB 13.8  HCT 42.0  MCV 87.5  PLT 146*   Recent Labs    05/09/19 1154  TROPONINI 0.08*   Invalid input(s): POCBNP No results for input(s): HGBA1C in the last 72 hours.   Weights: Filed Weights   05/09/19 1259 05/09/19 1525  Weight:  85.3 kg 85.3 kg     Radiology/Studies:  Dg Chest 2 View  Result Date: 05/09/2019 CLINICAL DATA:  Chest pain, shortness of breath, and left arm pain beginning last night. Former smoker. EXAM: CHEST - 2 VIEW COMPARISON:  Chest radiographs 12/26/2018 and CTA 12/27/2018 FINDINGS: A dual lead pacemaker remains in place. The cardiac silhouette is borderline enlarged. The lungs are less well inflated on the prior study and  there is mild bibasilar opacity compatible with atelectasis. Small pleural effusions are noted. No edema or pneumothorax is identified. No acute osseous abnormality is seen. IMPRESSION: Small bilateral pleural effusions and mild bibasilar atelectasis. Electronically Signed   By: Logan Bores M.D.   On: 05/09/2019 11:42     Assessment and Recommendation  85 y.o. male with known coronary artery disease status post previous myocardial infarction and multiple PCI and stents with chest pain pressure and elevated troponin possibly consistent with non-ST elevation myocardial infarction more consistent with demand ischemia from severe LV systolic dysfunction heart failure rather than acute coronary syndrome.  Most of symptoms appear to be from poor cardiac output heart failure with significantly decreased ejection fraction from last year to this year 1.  Lasix intravenously for possible mild pulmonary vascular congestion and congestive heart failure watching closely for chronic kidney disease exacerbation 2.  Continue high intensity cholesterol therapy 3.  Beta-blocker for severe LV systolic dysfunction worsened from before and would consider carvedilol if patient tolerates 4.  Continue ACE inhibitor and consider Entresto if able watching closely for hypotension 5.  No further cardiac intervention at this time due to patent stents and no evidence of acute myocardial infarction 6.  Begin ambulation and follow for improvements of above  Signed, Serafina Royals M.D. FACC

## 2019-05-09 NOTE — ED Notes (Addendum)
Katy from lab called to alert nurse that patient has an elevated Troponin of 0.08. MD notified.

## 2019-05-09 NOTE — ED Notes (Signed)
Report given to Steve, RN

## 2019-05-10 ENCOUNTER — Inpatient Hospital Stay
Admit: 2019-05-10 | Discharge: 2019-05-10 | Disposition: A | Discharge status: Home / Self Care | Payer: Medicare Other | Attending: Internal Medicine | Admitting: Internal Medicine

## 2019-05-10 ENCOUNTER — Inpatient Hospital Stay: Payer: Medicare Other

## 2019-05-10 ENCOUNTER — Encounter: Payer: Self-pay | Admitting: Internal Medicine

## 2019-05-10 DIAGNOSIS — I255 Ischemic cardiomyopathy: Secondary | ICD-10-CM | POA: Diagnosis present

## 2019-05-10 DIAGNOSIS — F4321 Adjustment disorder with depressed mood: Secondary | ICD-10-CM | POA: Diagnosis not present

## 2019-05-10 DIAGNOSIS — Z955 Presence of coronary angioplasty implant and graft: Secondary | ICD-10-CM | POA: Diagnosis not present

## 2019-05-10 DIAGNOSIS — G4730 Sleep apnea, unspecified: Secondary | ICD-10-CM | POA: Diagnosis not present

## 2019-05-10 DIAGNOSIS — E876 Hypokalemia: Secondary | ICD-10-CM | POA: Diagnosis present

## 2019-05-10 DIAGNOSIS — Z87891 Personal history of nicotine dependence: Secondary | ICD-10-CM | POA: Diagnosis not present

## 2019-05-10 DIAGNOSIS — R296 Repeated falls: Secondary | ICD-10-CM | POA: Diagnosis present

## 2019-05-10 DIAGNOSIS — E785 Hyperlipidemia, unspecified: Secondary | ICD-10-CM | POA: Diagnosis present

## 2019-05-10 DIAGNOSIS — R079 Chest pain, unspecified: Secondary | ICD-10-CM | POA: Diagnosis not present

## 2019-05-10 DIAGNOSIS — Z515 Encounter for palliative care: Secondary | ICD-10-CM | POA: Diagnosis present

## 2019-05-10 DIAGNOSIS — Z7189 Other specified counseling: Secondary | ICD-10-CM | POA: Diagnosis not present

## 2019-05-10 DIAGNOSIS — I252 Old myocardial infarction: Secondary | ICD-10-CM | POA: Diagnosis not present

## 2019-05-10 DIAGNOSIS — R0600 Dyspnea, unspecified: Secondary | ICD-10-CM | POA: Diagnosis present

## 2019-05-10 DIAGNOSIS — Z66 Do not resuscitate: Secondary | ICD-10-CM | POA: Diagnosis not present

## 2019-05-10 DIAGNOSIS — I25110 Atherosclerotic heart disease of native coronary artery with unstable angina pectoris: Secondary | ICD-10-CM | POA: Diagnosis present

## 2019-05-10 DIAGNOSIS — F418 Other specified anxiety disorders: Secondary | ICD-10-CM | POA: Diagnosis not present

## 2019-05-10 DIAGNOSIS — I130 Hypertensive heart and chronic kidney disease with heart failure and stage 1 through stage 4 chronic kidney disease, or unspecified chronic kidney disease: Secondary | ICD-10-CM | POA: Diagnosis present

## 2019-05-10 DIAGNOSIS — I5023 Acute on chronic systolic (congestive) heart failure: Secondary | ICD-10-CM | POA: Diagnosis present

## 2019-05-10 DIAGNOSIS — I350 Nonrheumatic aortic (valve) stenosis: Secondary | ICD-10-CM | POA: Diagnosis present

## 2019-05-10 DIAGNOSIS — I214 Non-ST elevation (NSTEMI) myocardial infarction: Secondary | ICD-10-CM | POA: Diagnosis present

## 2019-05-10 DIAGNOSIS — F4322 Adjustment disorder with anxiety: Secondary | ICD-10-CM | POA: Diagnosis present

## 2019-05-10 DIAGNOSIS — I952 Hypotension due to drugs: Secondary | ICD-10-CM | POA: Diagnosis not present

## 2019-05-10 DIAGNOSIS — M5136 Other intervertebral disc degeneration, lumbar region: Secondary | ICD-10-CM | POA: Diagnosis present

## 2019-05-10 DIAGNOSIS — F329 Major depressive disorder, single episode, unspecified: Secondary | ICD-10-CM | POA: Diagnosis present

## 2019-05-10 DIAGNOSIS — J90 Pleural effusion, not elsewhere classified: Secondary | ICD-10-CM | POA: Diagnosis present

## 2019-05-10 DIAGNOSIS — Z1159 Encounter for screening for other viral diseases: Secondary | ICD-10-CM | POA: Diagnosis not present

## 2019-05-10 DIAGNOSIS — N183 Chronic kidney disease, stage 3 (moderate): Secondary | ICD-10-CM | POA: Diagnosis present

## 2019-05-10 DIAGNOSIS — R0602 Shortness of breath: Secondary | ICD-10-CM | POA: Diagnosis not present

## 2019-05-10 DIAGNOSIS — T502X5A Adverse effect of carbonic-anhydrase inhibitors, benzothiadiazides and other diuretics, initial encounter: Secondary | ICD-10-CM | POA: Diagnosis not present

## 2019-05-10 DIAGNOSIS — J449 Chronic obstructive pulmonary disease, unspecified: Secondary | ICD-10-CM | POA: Diagnosis present

## 2019-05-10 LAB — BASIC METABOLIC PANEL
Anion gap: 8 (ref 5–15)
Anion gap: 8 (ref 5–15)
BUN: 15 mg/dL (ref 8–23)
BUN: 14 mg/dL (ref 8–23)
CO2: 28 mmol/L (ref 22–32)
CO2: 28 mmol/L (ref 22–32)
Calcium: 8 mg/dL — ABNORMAL LOW (ref 8.9–10.3)
Calcium: 8 mg/dL — ABNORMAL LOW (ref 8.9–10.3)
Chloride: 103 mmol/L (ref 98–111)
Chloride: 102 mmol/L (ref 98–111)
Creatinine, Ser: 1.31 mg/dL — ABNORMAL HIGH (ref 0.61–1.24)
Creatinine, Ser: 1.24 mg/dL (ref 0.61–1.24)
GFR calc Af Amer: 58 mL/min — ABNORMAL LOW (ref >60)
GFR calc Af Amer: >60 mL/min (ref >60)
GFR calc non Af Amer: 50 mL/min — ABNORMAL LOW (ref >60)
GFR calc non Af Amer: 53 mL/min — ABNORMAL LOW (ref >60)
Glucose, Bld: 99 mg/dL (ref 70–99)
Glucose, Bld: 107 mg/dL — ABNORMAL HIGH (ref 70–99)
Potassium: 2.9 mmol/L — ABNORMAL LOW (ref 3.5–5.1)
Potassium: 3.1 mmol/L — ABNORMAL LOW (ref 3.5–5.1)
Sodium: 139 mmol/L (ref 135–145)
Sodium: 138 mmol/L (ref 135–145)

## 2019-05-10 LAB — LIPID PANEL
Cholesterol: 104 mg/dL (ref 0–200)
HDL: 44 mg/dL (ref >40)
LDL Cholesterol: 42 mg/dL (ref 0–99)
Total CHOL/HDL Ratio: 2.4 RATIO
Triglycerides: 90 mg/dL (ref <150)
VLDL: 18 mg/dL (ref 0–40)

## 2019-05-10 LAB — ECHOCARDIOGRAM COMPLETE
Height: 72 in
Weight: 2700.8 oz

## 2019-05-10 LAB — TROPONIN I: Troponin I: 0.09 ng/mL (ref <0.03)

## 2019-05-10 LAB — MAGNESIUM: Magnesium: 1.7 mg/dL (ref 1.7–2.4)

## 2019-05-10 LAB — POTASSIUM: Potassium: 3.7 mmol/L (ref 3.5–5.1)

## 2019-05-10 MED ORDER — POTASSIUM CHLORIDE CRYS ER 20 MEQ PO TBCR
20.0000 meq | EXTENDED_RELEASE_TABLET | Freq: Once | ORAL | Status: AC
  Administered 2019-05-10: 20 meq via ORAL
  Filled 2019-05-10: qty 1

## 2019-05-10 MED ORDER — POTASSIUM CHLORIDE CRYS ER 20 MEQ PO TBCR
20.0000 meq | EXTENDED_RELEASE_TABLET | Freq: Once | ORAL | Status: AC
  Administered 2019-05-10: 15:00:00 20 meq via ORAL
  Filled 2019-05-10: qty 1

## 2019-05-10 MED ORDER — SPIRONOLACTONE 25 MG PO TABS
12.5000 mg | ORAL_TABLET | Freq: Every day | ORAL | Status: DC
  Administered 2019-05-10: 12.5 mg via ORAL
  Filled 2019-05-10: qty 0.5
  Filled 2019-05-10: qty 1
  Filled 2019-05-10: qty 0.5

## 2019-05-10 MED ORDER — ALUM & MAG HYDROXIDE-SIMETH 200-200-20 MG/5ML PO SUSP
15.0000 mL | Freq: Four times a day (QID) | ORAL | Status: DC | PRN
  Administered 2019-05-10: 19:00:00 15 mL via ORAL
  Filled 2019-05-10: qty 30

## 2019-05-10 MED ORDER — HEPARIN SODIUM (PORCINE) 5000 UNIT/ML IJ SOLN
5000.0000 [IU] | Freq: Three times a day (TID) | INTRAMUSCULAR | Status: DC
  Administered 2019-05-10 – 2019-05-17 (×20): 5000 [IU] via SUBCUTANEOUS
  Filled 2019-05-10 (×20): qty 1

## 2019-05-10 MED ORDER — POTASSIUM CHLORIDE CRYS ER 20 MEQ PO TBCR
40.0000 meq | EXTENDED_RELEASE_TABLET | Freq: Once | ORAL | Status: AC
  Administered 2019-05-10: 06:00:00 40 meq via ORAL
  Filled 2019-05-10: qty 2

## 2019-05-10 MED ORDER — ENSURE ENLIVE PO LIQD
237.0000 mL | Freq: Two times a day (BID) | ORAL | Status: DC
  Administered 2019-05-10 – 2019-05-17 (×12): 237 mL via ORAL

## 2019-05-10 MED ORDER — MAGNESIUM SULFATE IN D5W 1-5 GM/100ML-% IV SOLN
1.0000 g | Freq: Once | INTRAVENOUS | Status: AC
  Administered 2019-05-10: 1 g via INTRAVENOUS
  Filled 2019-05-10: qty 100

## 2019-05-10 MED ORDER — FUROSEMIDE 10 MG/ML IJ SOLN
40.0000 mg | Freq: Every day | INTRAMUSCULAR | Status: DC
  Administered 2019-05-11: 40 mg via INTRAVENOUS
  Filled 2019-05-10: qty 4

## 2019-05-10 NOTE — Progress Notes (Addendum)
Pharmacy Electrolyte Monitoring Consult:  Pharmacy consulted to assist in monitoring and replacing electrolytes in this 85 y.o. male admitted on 05/09/2019 with Chest Pain; Shortness of Breath; Arm Pain; and Dizziness   Labs:  Sodium (mmol/L)  Date Value  05/10/2019 138  03/28/2014 137   Potassium (mmol/L)  Date Value  05/10/2019 3.1 (L)  03/28/2014 4.2   Calcium (mg/dL)  Date Value  05/10/2019 8.0 (L)   Calcium, Total (mg/dL)  Date Value  03/28/2014 8.8   Albumin (g/dL)  Date Value  05/09/2019 3.1 (L)    Assessment/Plan: 5/21 2481:  K 3.1  Scr 1.24  Mag 1.7 Patient on lasix 40 IV q12h. MD ordered KCL 20 meq PO x 1.  Will order another KCL 20 meq PO x 1 now. Will order Magnesium sulfate 1 gram IV x1 Will f/u K at 1800 Will f/u electrolytes with am labs  Chinita Greenland PharmD Clinical Pharmacist 05/10/2019

## 2019-05-10 NOTE — Plan of Care (Signed)
  Problem: Cardiac: Goal: Ability to achieve and maintain adequate cardiovascular perfusion will improve Outcome: Progressing   Problem: Elimination: Goal: Will not experience complications related to urinary retention Outcome: Progressing   Problem: Pain Managment: Goal: General experience of comfort will improve Outcome: Progressing   Problem: Skin Integrity: Goal: Risk for impaired skin integrity will decrease Outcome: Progressing

## 2019-05-10 NOTE — Progress Notes (Signed)
Pt complains of feeling SOB, RN notes dyspneic at rest. Oxygen saturation is 95% on RA at rest. Lungs show fine crackles to bases. MD notified. Order for  CXR received. I will continue to assess.

## 2019-05-10 NOTE — Progress Notes (Signed)
Pharmacy Electrolyte Monitoring Consult:  Pharmacy consulted to assist in monitoring and replacing electrolytes in this 85 y.o. male admitted on 05/09/2019 with Chest Pain; Shortness of Breath; Arm Pain; and Dizziness   Labs:  Sodium (mmol/L)  Date Value  05/10/2019 138  03/28/2014 137   Potassium (mmol/L)  Date Value  05/10/2019 3.7  03/28/2014 4.2   Magnesium (mg/dL)  Date Value  05/10/2019 1.7   Calcium (mg/dL)  Date Value  05/10/2019 8.0 (L)   Calcium, Total (mg/dL)  Date Value  03/28/2014 8.8   Albumin (g/dL)  Date Value  05/09/2019 3.1 (L)    Assessment/Plan: 5/21 0613:  K 3.1  Scr 1.24  Mag 1.7 Patient on lasix 40 IV q12h. MD ordered KCL 20 meq PO x 1.  Will order another KCL 20 meq PO x 1 now. Will order Magnesium sulfate 1 gram IV x1  5/21:  K @ 1749 = 3.7. No additional K supplementation needed at this time.   Will f/u electrolytes with am labs  Dejion Grillo D Clinical Pharmacist 05/10/2019

## 2019-05-10 NOTE — Progress Notes (Signed)
Initial Nutrition Assessment  DOCUMENTATION CODES:   Not applicable  INTERVENTION:   Ensure Enlive po BID, each supplement provides 350 kcal and 20 grams of protein  NUTRITION DIAGNOSIS:   Increased nutrient needs related to chronic illness(CHF) as evidenced by increased estimated needs.  GOAL:   Patient will meet greater than or equal to 90% of their needs  MONITOR:   PO intake, Supplement acceptance, Labs, Weight trends, Skin, I & O's  REASON FOR ASSESSMENT:   Consult Diet education  ASSESSMENT:   85 year old male patient with history of basal cell carcinoma of the ear, hypertension, hyperlipidemia, myocardial infarction, coronary disease, cardiac stent under hospitalist service for chest pain  RD working remotely.  Spoke with pt via phone today. Pt reports good appetite and oral intake pta and today. Pt reports eating "as much as I usually do". Pt does report drinking chocolate Ensure at home; RD will order this while pt is hospitalized also. Per chart, pt with 18lb(10%) weight loss over the past 4 months; RD unsure how much weight loss is related to fluid changes vs real mass lost but this is significant. Recommend for patient to continue supplements at home. RD provided brief low sodium diet education to patient today. Will mail information regarding low sodium diet to pt's home.   Pt at risk for developing malnutrition but unable to diagnose at this time at NFPE cannot be performed.   Medications reviewed and include: aspirin, plavix, lasix, KCl, altace  Labs reviewed: K 3.1(L), Mg 1.7 wnl  Unable to complete Nutrition-Focused physical exam at this time.   Diet Order:   Diet Order            Diet Heart Room service appropriate? Yes; Fluid consistency: Thin  Diet effective now             EDUCATION NEEDS:   Education needs have been addressed  Skin:  Skin Assessment: Reviewed RN Assessment  Last BM:  5/20  Height:   Ht Readings from Last 1  Encounters:  05/09/19 6' (1.829 m)    Weight:   Wt Readings from Last 1 Encounters:  05/10/19 76.6 kg    Ideal Body Weight:  80.9 kg  BMI:  Body mass index is 22.89 kg/m.  Estimated Nutritional Needs:   Kcal:  2000-2300kcal/day   Protein:  104-120g/day   Fluid:  1.9L/day   Koleen Distance MS, RD, LDN Pager #- [phone] Office#- [phone] After Hours Pager: [phone]

## 2019-05-10 NOTE — Plan of Care (Signed)
  Problem: Cardiac: Goal: Ability to achieve and maintain adequate cardiovascular perfusion will improve Outcome: Progressing   

## 2019-05-10 NOTE — Progress Notes (Signed)
*  PRELIMINARY RESULTS* Echocardiogram 2D Echocardiogram has been performed.  Kristopher Fernandez 05/10/2019, 1:27 PM

## 2019-05-10 NOTE — Progress Notes (Signed)
[facility] Cardiology [facility] Encounter Note  Patient: Kristopher Fernandez / Admit Date: 05/09/2019 / Date of Encounter: 05/10/2019, 8:18 AM   Subjective: Patient has no further evidence of chest discomfort.  Shortness of breath is significantly continuing to.  Prevail as a primary concern.  Chest x-ray showed pleural effusions but no evidence of pulmonary edema with a BNP of 1462 consistent with low cardiac output congestive heart failure Catheterization showing severe LV systolic dysfunction with ejection fraction of 10 to 15% with severe dilation of left ventricle Coronary arteries show patent stents with no evidence of new critical stenoses or causes of chest discomfort.  Minimal elevation of troponin consistent with demand ischemia  Review of Systems: Positive for: Shortness of breath Negative for: Vision change, hearing change, syncope, dizziness, nausea, vomiting,diarrhea, bloody stool, stomach pain, cough, congestion, diaphoresis, urinary frequency, urinary pain,skin lesions, skin rashes Others previously listed  Objective: Telemetry: Paced rhythm with preventricular contractions Physical Exam: Blood pressure 133/82, pulse 71, temperature 97.8 F (36.6 C), temperature source Oral, resp. rate 18, height 6' (1.829 m), weight 76.6 kg, SpO2 93 %. Body mass index is 22.89 kg/m. General: Well developed, well nourished, in no acute distress. Head: Normocephalic, atraumatic, sclera non-icteric, no xanthomas, nares are without discharge. Neck: No apparent masses Lungs: Normal respirations with no wheezes, no rhonchi, no rales , basilar crackles   Heart: Regular rate and rhythm, normal S1 S2, no murmur, no rub, no gallop, PMI is normal size and placement, carotid upstroke normal without bruit, jugular venous pressure normal Abdomen: Soft, non-tender, non-distended with normoactive bowel sounds. No hepatosplenomegaly. Abdominal aorta is normal size without bruit Extremities: Ace edema, no  clubbing, no cyanosis, no ulcers,  Peripheral: 2+ radial, 2+ femoral, 2+ dorsal pedal pulses Neuro: Alert and oriented. Moves all extremities spontaneously. Psych:  Responds to questions appropriately with a normal affect.   Intake/Output Summary (Last 24 hours) at 05/10/2019 0818 Last data filed at 05/10/2019 0543 Gross per 24 hour  Intake 359.68 ml  Output 2735 ml  Net -2375.32 ml    Inpatient Medications:  . aspirin EC  81 mg Oral Daily  . atorvastatin  80 mg Oral Daily  . carvedilol  3.125 mg Oral BID WC  . clopidogrel  75 mg Oral Daily  . furosemide  40 mg Intravenous BID  . isosorbide mononitrate  30 mg Oral Daily  . mometasone-formoterol  2 puff Inhalation BID  . potassium chloride  20 mEq Oral Once  . ramipril  5 mg Oral Daily  . sodium chloride flush  3 mL Intravenous Q12H  . sodium chloride flush  3 mL Intravenous Q12H   Infusions:  . sodium chloride      Labs: Recent Labs    05/10/19 0256 05/10/19 0613  NA 139 138  K 2.9* 3.1*  CL 103 102  CO2 28 28  GLUCOSE 99 107*  BUN 15 14  CREATININE 1.31* 1.24  CALCIUM 8.0* 8.0*   Recent Labs    05/09/19 1154  AST 22  ALT 18  ALKPHOS 97  BILITOT 1.4*  PROT 6.0*  ALBUMIN 3.1*   Recent Labs    05/09/19 1034  WBC 7.2  HGB 13.8  HCT 42.0  MCV 87.5  PLT 146*   Recent Labs    05/09/19 1154 05/09/19 1803 05/09/19 2051 05/10/19 0256  TROPONINI 0.08* 0.09* 0.09* 0.09*   Invalid input(s): POCBNP No results for input(s): HGBA1C in the last 72 hours.   Weights: [employer]   05/09/19  1259 05/09/19 1525 05/10/19 0352  Weight: 85.3 kg 85.3 kg 76.6 kg     Radiology/Studies:  Dg Chest 2 View  Result Date: 05/09/2019 CLINICAL DATA:  Chest pain, shortness of breath, and left arm pain beginning last night. Former smoker. EXAM: CHEST - 2 VIEW COMPARISON:  Chest radiographs 12/26/2018 and CTA 12/27/2018 FINDINGS: A dual lead pacemaker remains in place. The cardiac silhouette is borderline enlarged. The  lungs are less well inflated on the prior study and there is mild bibasilar opacity compatible with atelectasis. Small pleural effusions are noted. No edema or pneumothorax is identified. No acute osseous abnormality is seen. IMPRESSION: Small bilateral pleural effusions and mild bibasilar atelectasis. Electronically Signed   By: Logan Bores M.D.   On: 05/09/2019 11:42     Assessment and Recommendation  85 y.o. male with known coronary artery disease status post multiple previous coronary artery stents and myocardial infarctions with severe acute shortness of breath and elevated BNP consistent with poor cardiac output congestive heart failure with low ejection fraction of 10 to 15% and patent coronary arteries and stents without evidence of myocardial infarction 1.  Gentle diuresis as necessary for acute systolic dysfunction heart failure although being very careful due to no current evidence of pulmonary edema or significant lower extremity edema 2.  Continue addition of carvedilol and increased dose as necessary for cardiomyopathy 3.  Continue ACE inhibitor previously taking for cardiomyopathy and hypertension control 4.  No further cardiac diagnostics necessary at this time 5.  Cardiac rehabilitation and continued improvements of symptoms and issues listed above  Signed, Serafina Royals M.D. FACC

## 2019-05-10 NOTE — Progress Notes (Addendum)
[facility] [place] NAME: Kristopher Fernandez    MR#:  [id number]  DATE OF BIRTH:  [birth year]  SUBJECTIVE:  CHIEF COMPLAINT:   Chief Complaint  Patient presents with  . Chest Pain  . Shortness of Breath  . Arm Pain  . Dizziness  Patient seen and evaluated today No complaints of chest pain Decrease shortness of breath No palpitation  REVIEW OF SYSTEMS:    ROS  CONSTITUTIONAL: No documented fever. No fatigue, weakness. No weight gain, no weight loss.  EYES: No blurry or double vision.  ENT: No tinnitus. No postnasal drip. No redness of the oropharynx.  RESPIRATORY: No cough, no wheeze, no hemoptysis. mild dyspnea.  CARDIOVASCULAR: No chest pain. No orthopnea. No palpitations. No syncope.  GASTROINTESTINAL: No nausea, no vomiting or diarrhea. No abdominal pain. No melena or hematochezia.  GENITOURINARY: No dysuria or hematuria.  ENDOCRINE: No polyuria or nocturia. No heat or cold intolerance.  HEMATOLOGY: No anemia. No bruising. No bleeding.  INTEGUMENTARY: No rashes. No lesions.  MUSCULOSKELETAL: No arthritis. No swelling. No gout.  NEUROLOGIC: No numbness, tingling, or ataxia. No seizure-type activity.  PSYCHIATRIC: No anxiety. No insomnia. No ADD.   DRUG ALLERGIES:  No Known Allergies  VITALS:  Blood pressure 133/82, pulse 71, temperature 97.8 F (36.6 C), temperature source Oral, resp. rate 18, height 6' (1.829 m), weight 76.6 kg, SpO2 93 %.  PHYSICAL EXAMINATION:   Physical Exam  GENERAL:  85 y.o.-year-old patient lying in the bed with no acute distress.  EYES: Pupils equal, round, reactive to light and accommodation. No scleral icterus. Extraocular muscles intact.  HEENT: Head atraumatic, normocephalic. Oropharynx and nasopharynx clear.  NECK:  Supple, no jugular venous distention. No thyroid enlargement, no tenderness.  LUNGS: Normal breath sounds bilaterally, no wheezing, rales, rhonchi. No use of accessory muscles of  respiration.  CARDIOVASCULAR: S1, S2 normal. No murmurs, rubs, or gallops.  ABDOMEN: Soft, nontender, nondistended. Bowel sounds present. No organomegaly or mass.  EXTREMITIES: No cyanosis, clubbing  has edema b/l.    NEUROLOGIC: Cranial nerves II through XII are intact. No focal Motor or sensory deficits b/l.   PSYCHIATRIC: The patient is alert and oriented x 3.  SKIN: No obvious rash, lesion, or ulcer.   LABORATORY PANEL:   CBC Recent Labs  Lab 05/09/19 1034  WBC 7.2  HGB 13.8  HCT 42.0  PLT 146*   ------------------------------------------------------------------------------------------------------------------ Chemistries  Recent Labs  Lab 05/09/19 1154  05/10/19 0613  NA 136   < > 138  K 3.3*   < > 3.1*  CL 101   < > 102  CO2 27   < > 28  GLUCOSE 108*   < > 107*  BUN 19   < > 14  CREATININE 1.26*   < > 1.24  CALCIUM 8.6*   < > 8.0*  AST 22  --   --   ALT 18  --   --   ALKPHOS 97  --   --   BILITOT 1.4*  --   --    < > = values in this interval not displayed.   ------------------------------------------------------------------------------------------------------------------  Cardiac Enzymes Recent Labs  Lab 05/10/19 0256  TROPONINI 0.09*   ------------------------------------------------------------------------------------------------------------------  RADIOLOGY:  Dg Chest 2 View  Result Date: 05/09/2019 CLINICAL DATA:  Chest pain, shortness of breath, and left arm pain beginning last night. Former smoker. EXAM: CHEST - 2 VIEW COMPARISON:  Chest radiographs 12/26/2018 and CTA 12/27/2018 FINDINGS: A dual  lead pacemaker remains in place. The cardiac silhouette is borderline enlarged. The lungs are less well inflated on the prior study and there is mild bibasilar opacity compatible with atelectasis. Small pleural effusions are noted. No edema or pneumothorax is identified. No acute osseous abnormality is seen. IMPRESSION: Small bilateral pleural effusions and  mild bibasilar atelectasis. Electronically Signed   By: Logan Bores M.D.   On: 05/09/2019 11:42     ASSESSMENT AND PLAN:   85 year old male patient with history of basal cell carcinoma of the ear, hypertension, hyperlipidemia, myocardial infarction, coronary disease, cardiac stent under hospitalist service for chest pain  -Non-ST elevation MI From demand ischemia from severe LV systolic dysfunction Diuresis with Lasix as per cardiology High intensity statin therapy Beta-blocker therapy S/p cardiac cath, no blockage noted ACE inhibitor if blood pressure permits  -Hyperlipidemia Continue high intensity statin  -Hypertension Continue Coreg and ACE inhibitor  -DVT prophylaxis Start patient on subcu heparin  -Acute hypokalemia Replace potassium  All the records are reviewed and case discussed with Care Management/Social Worker. Management plans discussed with the patient, family and they are in agreement.  CODE STATUS: Full code  DVT Prophylaxis: SCDs  TOTAL TIME TAKING CARE OF THIS PATIENT: 38 minutes.   POSSIBLE D/C IN 2 to 3 DAYS, DEPENDING ON CLINICAL CONDITION.  Saundra Shelling M.D on 05/10/2019 at 10:21 AM  Between 7am to 6pm - Pager - [phone]  After 6pm go to [email or web address] - password [email or web address] [facility]  Office  [phone]  CC: Primary care physician; Glendon Axe, MD  Note: This dictation was prepared with Dragon dictation along with smaller phrase technology. Any transcriptional errors that result from this process are unintentional.

## 2019-05-11 LAB — BASIC METABOLIC PANEL
Anion gap: 10 (ref 5–15)
BUN: 18 mg/dL (ref 8–23)
CO2: 29 mmol/L (ref 22–32)
Calcium: 8.5 mg/dL — ABNORMAL LOW (ref 8.9–10.3)
Chloride: 99 mmol/L (ref 98–111)
Creatinine, Ser: 1.13 mg/dL (ref 0.61–1.24)
GFR calc Af Amer: >60 mL/min (ref >60)
GFR calc non Af Amer: 59 mL/min — ABNORMAL LOW (ref >60)
Glucose, Bld: 117 mg/dL — ABNORMAL HIGH (ref 70–99)
Potassium: 3.7 mmol/L (ref 3.5–5.1)
Sodium: 138 mmol/L (ref 135–145)

## 2019-05-11 MED ORDER — SPIRONOLACTONE 25 MG PO TABS
25.0000 mg | ORAL_TABLET | Freq: Every day | ORAL | Status: DC
  Administered 2019-05-11 – 2019-05-15 (×4): 25 mg via ORAL
  Filled 2019-05-11 (×6): qty 1

## 2019-05-11 MED ORDER — ALUM & MAG HYDROXIDE-SIMETH 200-200-20 MG/5ML PO SUSP
30.0000 mL | Freq: Once | ORAL | Status: AC
  Administered 2019-05-11: 30 mL via ORAL
  Filled 2019-05-11: qty 30

## 2019-05-11 NOTE — Plan of Care (Signed)
  Problem: Activity: Goal: Ability to tolerate increased activity will improve Outcome: Progressing   Problem: Clinical Measurements: Goal: Respiratory complications will improve Outcome: Not Progressing  Placed back on O2, labored breathing throughout evening Problem: Elimination: Goal: Will not experience complications related to urinary retention Outcome: Progressing   Problem: Safety: Goal: Ability to remain free from injury will improve Outcome: Progressing

## 2019-05-11 NOTE — Progress Notes (Signed)
[facility] [place] NAME: Kristopher Fernandez    MR#:  [id number]  DATE OF BIRTH:  1932-10-01  SUBJECTIVE:  CHIEF COMPLAINT:   Chief Complaint  Patient presents with  . Chest Pain  . Shortness of Breath  . Arm Pain  . Dizziness  Patient seen and evaluated today Had shortness of breath last evening Felt as if he was passing out No palpitation On oxygen via nasal canula at 2 liter  REVIEW OF SYSTEMS:    ROS  CONSTITUTIONAL: No documented fever. No fatigue, weakness. No weight gain, no weight loss.  EYES: No blurry or double vision.  ENT: No tinnitus. No postnasal drip. No redness of the oropharynx.  RESPIRATORY: No cough, no wheeze, no hemoptysis. Has dyspnea.  CARDIOVASCULAR: No chest pain. No orthopnea. No palpitations. No syncope.  GASTROINTESTINAL: No nausea, no vomiting or diarrhea. No abdominal pain. No melena or hematochezia.  GENITOURINARY: No dysuria or hematuria.  ENDOCRINE: No polyuria or nocturia. No heat or cold intolerance.  HEMATOLOGY: No anemia. No bruising. No bleeding.  INTEGUMENTARY: No rashes. No lesions.  MUSCULOSKELETAL: No arthritis. No swelling. No gout.  NEUROLOGIC: No numbness, tingling, or ataxia. No seizure-type activity.  PSYCHIATRIC: No anxiety. No insomnia. No ADD.   DRUG ALLERGIES:  No Known Allergies  VITALS:  Blood pressure (!) 143/99, pulse 62, temperature 97.8 F (36.6 C), temperature source Oral, resp. rate 17, height 6' (1.829 m), weight 76.6 kg, SpO2 98 %.  PHYSICAL EXAMINATION:   Physical Exam  GENERAL:  85 y.o.-year-old patient lying in the bed with no acute distress.  EYES: Pupils equal, round, reactive to light and accommodation. No scleral icterus. Extraocular muscles intact.  HEENT: Head atraumatic, normocephalic. Oropharynx and nasopharynx clear.  NECK:  Supple, no jugular venous distention. No thyroid enlargement, no tenderness.  LUNGS: Decreased breath sounds bilaterally, bibasilar  crepitations heard. No use of accessory muscles of respiration.  CARDIOVASCULAR: S1, S2 normal. No murmurs, rubs, or gallops.  ABDOMEN: Soft, nontender, nondistended. Bowel sounds present. No organomegaly or mass.  EXTREMITIES: No cyanosis, clubbing  has edema b/l.    NEUROLOGIC: Cranial nerves II through XII are intact. No focal Motor or sensory deficits b/l.   PSYCHIATRIC: The patient is alert and oriented x 3.  SKIN: No obvious rash, lesion, or ulcer.   LABORATORY PANEL:   CBC Recent Labs  Lab 05/09/19 1034  WBC 7.2  HGB 13.8  HCT 42.0  PLT 146*   ------------------------------------------------------------------------------------------------------------------ Chemistries  Recent Labs  Lab 05/09/19 1154  05/10/19 0613  05/11/19 0410  NA 136   < > 138  --  138  K 3.3*   < > 3.1*   < > 3.7  CL 101   < > 102  --  99  CO2 27   < > 28  --  29  GLUCOSE 108*   < > 107*  --  117*  BUN 19   < > 14  --  18  CREATININE 1.26*   < > 1.24  --  1.13  CALCIUM 8.6*   < > 8.0*  --  8.5*  MG  --   --  1.7  --   --   AST 22  --   --   --   --   ALT 18  --   --   --   --   ALKPHOS 97  --   --   --   --  BILITOT 1.4*  --   --   --   --    < > = values in this interval not displayed.   ------------------------------------------------------------------------------------------------------------------  Cardiac Enzymes Recent Labs  Lab 05/10/19 0256  TROPONINI 0.09*   ------------------------------------------------------------------------------------------------------------------  RADIOLOGY:  Dg Chest 2 View  Result Date: 05/10/2019 CLINICAL DATA:  Shortness of breath EXAM: CHEST - 2 VIEW COMPARISON:  05/09/2019 FINDINGS: Small left pleural effusion with basilar atelectasis. Lungs are otherwise clear. Left chest wall pacemaker leads are in unchanged position. IMPRESSION: Small left pleural effusion with basilar atelectasis. Electronically Signed   By: Ulyses Jarred M.D.   On:  05/10/2019 20:20   Dg Chest 2 View  Result Date: 05/09/2019 CLINICAL DATA:  Chest pain, shortness of breath, and left arm pain beginning last night. Former smoker. EXAM: CHEST - 2 VIEW COMPARISON:  Chest radiographs 12/26/2018 and CTA 12/27/2018 FINDINGS: A dual lead pacemaker remains in place. The cardiac silhouette is borderline enlarged. The lungs are less well inflated on the prior study and there is mild bibasilar opacity compatible with atelectasis. Small pleural effusions are noted. No edema or pneumothorax is identified. No acute osseous abnormality is seen. IMPRESSION: Small bilateral pleural effusions and mild bibasilar atelectasis. Electronically Signed   By: Logan Bores M.D.   On: 05/09/2019 11:42     ASSESSMENT AND PLAN:   85 year old male patient with history of basal cell carcinoma of the ear, hypertension, hyperlipidemia, myocardial infarction, coronary disease, cardiac stent under hospitalist service for chest pain  -Acute on chronic systolic heart failure exacerbation Diuresis with lasix Aldactone 25 mg daily Entresto from tmrw Hold Ace inhibitor Echo : severely reduced systolic function with EF 20 to 25%  -Non-ST elevation MI From demand ischemia from severe LV systolic dysfunction Diuresis with Lasix as per cardiology High intensity statin therapy Beta-blocker therapy S/p cardiac cath, no blockage noted ACE inhibitor stopped Will start entresto as per cardiology from tmrw  -Hyperlipidemia Continue high intensity statin  -Hypertension Continue Coreg and ACE inhibitor  -DVT prophylaxis Start patient on subcu heparin  -Acute hypokalemia Replace potassium  All the records are reviewed and case discussed with Care Management/Social Worker. Management plans discussed with the patient, family and they are in agreement.  CODE STATUS: Full code  DVT Prophylaxis: SCDs  TOTAL TIME TAKING CARE OF THIS PATIENT: 35 minutes.   POSSIBLE D/C IN 2 to 3 DAYS,  DEPENDING ON CLINICAL CONDITION.  Saundra Shelling M.D on 05/11/2019 at 10:41 AM  Between 7am to 6pm - Pager - [phone]  After 6pm go to [email or web address] - password [email or web address] [facility]  Office  [phone]  CC: Primary care physician; Medicine, [facility]  Note: This dictation was prepared with Dragon dictation along with smaller phrase technology. Any transcriptional errors that result from this process are unintentional.

## 2019-05-11 NOTE — Progress Notes (Signed)
Cardiac Rehab EP Note  CHF Education:?? Educational session with patient completed.  Provided patient with "Living Better with Heart Failure" packet. Briefly reviewed definition of heart failure and signs and symptoms of an exacerbation.?Discussed potential causes of CHF.  Explained to patient that HF is a chronic illness which requires self-assessment / self-management along with help from the cardiologist/PCP.??Discussed definition of EF measurement along with normal value compared to patient's EF of 20-25%. ? *Reviewed importance of and reason behind checking weight daily in the AM, after using the bathroom, but before getting dressed.  Patient has functioning scale, weighs every morning and records it on a chart. Patient reports losing weight since his wife passed 6 weeks ago.    *Reviewed with patient the following information: *Discussed when to call the Dr= weight gain of >2-3lb overnight of 5lb in a week,  *Discussed yellow zone= call MD: weight gain of >2-3lb overnight of 5lb in a week, increased swelling, increased SOB when lying down, chest discomfort, dizziness, increased fatigue *Red Zone= call 911: struggle to breath, fainting or near fainting, significant chest pain ?  *Heart Failure Zone Magnet given and reviewed with patient.   ? *Diet - Patient currently ordered Heart Healthy Diet.  Referral for Dietitian Consultation for diet education has been ordered. Instructed patient to follow a low sodium diet of 2000 mg or less.  Recommended foods for low sodium heart healthy nutrition or heart failure carb modified nutrition therapy discussed. Reviewed with patient steps to reading a food label with close attention to serving size and mg of sodium.   ? *Discussed fluid intake with patient as well. Patient not currently on a fluid restriction, but advised no more than 64 ounces of fluid per day. Demonstrated this volume to patient using the bedside water pitcher.   ? *Instructed patient  to take medications as prescribed for heart failure. Explained briefly why patient is on the medications (either make you feel better, live longer or keep you out of the hospital) and discussed monitoring and side effects.  ? *Discussed exercise / activity. Patient reports slowing down and just doing what he can recently. This EP encouraged patient to be as active as possible. Patient lives alone and reports having good support. Patient's grandson helps him farm, neighbors help with food, and someone mows his yard. Give patient program brochure and informational sheet as appropriate. Patient attended Cardiac Rehab in 2006 and would like to attend again.  ? *Smoking Cessation- Patient is a FORMER smoker.  *[facility] - Explained the purpose of the HF Clinic. ?Explained to patient the HF Clinic does not replace PCP nor Cardiologist, but is an additional resource to helping patient manage heart failure at home.  Again, the 5 Steps to Living Better with Heart Failure were reviewed with patient. Will work on scheduling Patient a follow-up appointment at the HF Clinic.  Patient thanked me for providing the above information. ?  Kristopher Fernandez, [facility]  Exercise Physiologist Department Phone #: [phone] Fax: [phone]  Direct Line [phone] Email Address: [email or web address]

## 2019-05-11 NOTE — Progress Notes (Signed)
Pharmacy Electrolyte Monitoring Consult:  Pharmacy consulted to assist in monitoring and replacing electrolytes in this 85 y.o. male admitted on 05/09/2019 with Chest Pain; Shortness of Breath; Arm Pain; and Dizziness   Labs:  Sodium (mmol/L)  Date Value  05/11/2019 138  03/28/2014 137   Potassium (mmol/L)  Date Value  05/11/2019 3.7  03/28/2014 4.2   Magnesium (mg/dL)  Date Value  05/10/2019 1.7   Calcium (mg/dL)  Date Value  05/11/2019 8.5 (L)   Calcium, Total (mg/dL)  Date Value  03/28/2014 8.8   Albumin (g/dL)  Date Value  05/09/2019 3.1 (L)    Assessment/Plan: 5/22   K 3.7  Scr 1.24   Patient on lasix 40 IV daily, spironolactone daily No supplementation at this time Will f/u electrolytes with am labs  [facility] A Clinical Pharmacist 05/11/2019

## 2019-05-11 NOTE — Clinical Social Work Note (Signed)
CSW was informed that patient will be on Entresto medication, CSW provided patient with free 30 day supply coupon.  Patient did was asking about what the Delene Loll does and how it works, [place] informed patient that CSW is not familiar with the medication use, CSW asked bedside nurse if she can explain to patient about it.  CSW signing off, please reconsult if social work needs arise.  Jones Broom. [place], MSW, [place]  05/11/2019 5:52 PM

## 2019-05-11 NOTE — Progress Notes (Signed)
[facility] Cardiology  SUBJECTIVE: Patient sitting in bed, eating breakfast, denies chest pain, he reports he was "going out" last night   Vitals:   05/10/19 1959 05/11/19 0440 05/11/19 0502 05/11/19 0745  BP: 116/71 (!) 145/83 (!) 146/86 (!) 143/99  Pulse: 70 66 62 62  Resp: 20  17   Temp: 98.3 F (36.8 C) 97.6 F (36.4 C) (!) 97.5 F (36.4 C) 97.8 F (36.6 C)  TempSrc: Oral Oral Oral Oral  SpO2: 96% 95% 100% 98%  Weight:      Height:         Intake/Output Summary (Last 24 hours) at 05/11/2019 1308 Last data filed at 05/11/2019 0129 Gross per 24 hour  Intake 104.62 ml  Output 1150 ml  Net -1045.38 ml      PHYSICAL EXAM  General: Well developed, well nourished, in no acute distress HEENT:  Normocephalic and atramatic Neck:  No JVD.  Lungs: Decreased breath sounds both bases Heart: HRRR . Normal S1 and S2 without gallops or murmurs.  Abdomen: Bowel sounds are positive, abdomen soft and non-tender  Msk:  Back normal, normal gait. Normal strength and tone for age. Extremities: No clubbing, cyanosis or edema.   Neuro: Alert and oriented X 3. Psych:  Good affect, responds appropriately   LABS: Basic Metabolic Panel: Recent Labs    05/10/19 0613 05/10/19 1749 05/11/19 0410  NA 138  --  138  K 3.1* 3.7 3.7  CL 102  --  99  CO2 28  --  29  GLUCOSE 107*  --  117*  BUN 14  --  18  CREATININE 1.24  --  1.13  CALCIUM 8.0*  --  8.5*  MG 1.7  --   --    Liver Function Tests: Recent Labs    05/09/19 1154  AST 22  ALT 18  ALKPHOS 97  BILITOT 1.4*  PROT 6.0*  ALBUMIN 3.1*   No results for input(s): LIPASE, AMYLASE in the last 72 hours. CBC: Recent Labs    05/09/19 1034  WBC 7.2  HGB 13.8  HCT 42.0  MCV 87.5  PLT 146*   Cardiac Enzymes: Recent Labs    05/09/19 1803 05/09/19 2051 05/10/19 0256  TROPONINI 0.09* 0.09* 0.09*   BNP: Invalid input(s): POCBNP D-Dimer: No results for input(s): DDIMER in the last 72 hours. Hemoglobin A1C: No results for  input(s): HGBA1C in the last 72 hours. Fasting Lipid Panel: Recent Labs    05/10/19 0256  CHOL 104  HDL 44  LDLCALC 42  TRIG 90  CHOLHDL 2.4   Thyroid Function Tests: No results for input(s): TSH, T4TOTAL, T3FREE, THYROIDAB in the last 72 hours.  Invalid input(s): FREET3 Anemia Panel: No results for input(s): VITAMINB12, FOLATE, FERRITIN, TIBC, IRON, RETICCTPCT in the last 72 hours.  Dg Chest 2 View  Result Date: 05/10/2019 CLINICAL DATA:  Shortness of breath EXAM: CHEST - 2 VIEW COMPARISON:  05/09/2019 FINDINGS: Small left pleural effusion with basilar atelectasis. Lungs are otherwise clear. Left chest wall pacemaker leads are in unchanged position. IMPRESSION: Small left pleural effusion with basilar atelectasis. Electronically Signed   By: Ulyses Jarred M.D.   On: 05/10/2019 20:20   Dg Chest 2 View  Result Date: 05/09/2019 CLINICAL DATA:  Chest pain, shortness of breath, and left arm pain beginning last night. Former smoker. EXAM: CHEST - 2 VIEW COMPARISON:  Chest radiographs 12/26/2018 and CTA 12/27/2018 FINDINGS: A dual lead pacemaker remains in place. The cardiac silhouette is borderline enlarged. The  lungs are less well inflated on the prior study and there is mild bibasilar opacity compatible with atelectasis. Small pleural effusions are noted. No edema or pneumothorax is identified. No acute osseous abnormality is seen. IMPRESSION: Small bilateral pleural effusions and mild bibasilar atelectasis. Electronically Signed   By: Logan Bores M.D.   On: 05/09/2019 11:42     Echo pending  TELEMETRY: Normal sinus rhythm:  ASSESSMENT AND PLAN:  Active Problems:   Chest pain    1.  Acute on chronic systolic congestive heart failure 2.  Patent stents proximal mid RCA, proximal left circumflex by cardiac cath 05/09/2019 3.  Ischemic cardiomyopathy, estimated LVEF 10 to 15% by LV gram 4.  Underlying COPD  Recommendations  1.  Agree with current therapy 2.  Continue  diuresis 3.  Carefully monitor renal status 4.  Uptitrate spironolactone to 25 mg daily 5.  Hold ramipril 6.  Likely start Entresto on/23/2020 7.  Review 2D echocardiogram   Isaias Cowman, MD, PhD, [facility] 05/11/2019 8:42 AM

## 2019-05-12 LAB — BASIC METABOLIC PANEL
Anion gap: 6 (ref 5–15)
BUN: 21 mg/dL (ref 8–23)
CO2: 32 mmol/L (ref 22–32)
Calcium: 8.2 mg/dL — ABNORMAL LOW (ref 8.9–10.3)
Chloride: 99 mmol/L (ref 98–111)
Creatinine, Ser: 1.23 mg/dL (ref 0.61–1.24)
GFR calc Af Amer: >60 mL/min (ref >60)
GFR calc non Af Amer: 54 mL/min — ABNORMAL LOW (ref >60)
Glucose, Bld: 104 mg/dL — ABNORMAL HIGH (ref 70–99)
Potassium: 3.8 mmol/L (ref 3.5–5.1)
Sodium: 137 mmol/L (ref 135–145)

## 2019-05-12 LAB — MAGNESIUM: Magnesium: 2.2 mg/dL (ref 1.7–2.4)

## 2019-05-12 MED ORDER — SACUBITRIL-VALSARTAN 24-26 MG PO TABS
1.0000 | ORAL_TABLET | Freq: Two times a day (BID) | ORAL | Status: DC
  Administered 2019-05-12 – 2019-05-15 (×6): 1 via ORAL
  Filled 2019-05-12 (×8): qty 1

## 2019-05-12 MED ORDER — ALPRAZOLAM 0.25 MG PO TABS
0.2500 mg | ORAL_TABLET | Freq: Two times a day (BID) | ORAL | Status: DC
  Administered 2019-05-12 – 2019-05-15 (×5): 0.25 mg via ORAL
  Filled 2019-05-12 (×7): qty 1

## 2019-05-12 MED ORDER — FUROSEMIDE 10 MG/ML IJ SOLN
40.0000 mg | Freq: Two times a day (BID) | INTRAMUSCULAR | Status: DC
  Administered 2019-05-12 (×2): 40 mg via INTRAVENOUS
  Filled 2019-05-12 (×2): qty 4

## 2019-05-12 MED ORDER — MAGNESIUM HYDROXIDE 400 MG/5ML PO SUSP
30.0000 mL | Freq: Every day | ORAL | Status: DC | PRN
  Administered 2019-05-12 – 2019-05-14 (×3): 30 mL via ORAL
  Filled 2019-05-12 (×3): qty 30

## 2019-05-12 MED ORDER — POTASSIUM CHLORIDE CRYS ER 20 MEQ PO TBCR
20.0000 meq | EXTENDED_RELEASE_TABLET | Freq: Once | ORAL | Status: AC
  Administered 2019-05-12: 14:00:00 20 meq via ORAL
  Filled 2019-05-12: qty 1

## 2019-05-12 NOTE — Progress Notes (Addendum)
Pharmacy Electrolyte Monitoring Consult:  Pharmacy consulted to assist in monitoring and replacing electrolytes in this 85 y.o. male admitted on 05/09/2019 with Chest Pain; Shortness of Breath; Arm Pain; and Dizziness   Labs:  Sodium (mmol/L)  Date Value  05/12/2019 137  03/28/2014 137   Potassium (mmol/L)  Date Value  05/12/2019 3.8  03/28/2014 4.2   Magnesium (mg/dL)  Date Value  05/12/2019 2.2   Calcium (mg/dL)  Date Value  05/12/2019 8.2 (L)   Calcium, Total (mg/dL)  Date Value  03/28/2014 8.8   Albumin (g/dL)  Date Value  05/09/2019 3.1 (L)    Assessment/Plan: Furosemide increased to 40mg  IV Q12hr. Patient continued on spironolactone 25mg  daily.   Potassium 87mEq PO x 1.   Will obtain electrolytes with am labs.   Pharmacy will continue to monitor and adjust per consult.    Simpson,Michael L 05/12/2019

## 2019-05-12 NOTE — Progress Notes (Signed)
[place] at [place] NAME: Kristopher Fernandez    MR#:  [id number]  DATE OF BIRTH:  [birth year]  SUBJECTIVE:   Chief Complaint  Patient presents with  . Chest Pain  . Shortness of Breath  . Arm Pain  . Dizziness   Patient noted to be short of breath at rest with mild use of accessory muscle of respiration. He reports that he usually feels somewhat better with the Lasix treatment however this tends to wearing off after 18 hours.   REVIEW OF SYSTEMS:  Review of Systems  Constitutional: Negative for chills, fever, malaise/fatigue and weight loss.  HENT: Positive for hearing loss. Negative for congestion and sore throat.   Eyes: Negative for blurred vision and double vision.  Respiratory: Positive for shortness of breath and wheezing. Negative for cough.   Cardiovascular: Positive for orthopnea. Negative for chest pain, palpitations and leg swelling.  Gastrointestinal: Negative for abdominal pain, diarrhea, nausea and vomiting.  Genitourinary: Positive for frequency. Negative for dysuria and urgency.  Musculoskeletal: Negative for myalgias.  Skin: Negative for rash.  Neurological: Negative for dizziness, sensory change, speech change, focal weakness and headaches.  Psychiatric/Behavioral: Negative for depression.    DRUG ALLERGIES:  No Known Allergies VITALS:  Blood pressure 126/71, pulse 62, temperature 97.7 F (36.5 C), resp. rate 16, height 6' (1.829 m), weight 81.1 kg, SpO2 99 %. PHYSICAL EXAMINATION:   GENERAL:  85 y.o.-year-old patient lying in the bed with no acute distress.  EYES: Pupils equal, round, reactive to light and accommodation. No scleral icterus. Extraocular muscles intact.  HEENT: Head atraumatic, normocephalic. Oropharynx and nasopharynx clear.  NECK:  Supple, no jugular venous distention. No thyroid enlargement, no tenderness.  LUNGS: Normal breath sounds bilaterally, mild wheezing and crackles bilaterally. Mild use of  accessory muscles of respiration.  CARDIOVASCULAR: S1, S2 normal. No murmurs, rubs, or gallops.  ABDOMEN: Soft, nontender, nondistended. Bowel sounds present. No organomegaly or mass.  EXTREMITIES: Trace pedal edema, No cyanosis, or clubbing.  NEUROLOGIC: Cranial nerves II through XII are intact. Muscle strength 5/5 in all extremities. Sensation intact. Gait not checked.  PSYCHIATRIC: The patient is alert and oriented x 3.  SKIN: No obvious rash, lesion, or ulcer.   DATA REVIEWED:  LABORATORY PANEL:  Male CBC Recent Labs  Lab 05/09/19 1034  WBC 7.2  HGB 13.8  HCT 42.0  PLT 146*   ------------------------------------------------------------------------------------------------------------------ Chemistries  Recent Labs  Lab 05/09/19 1154  05/12/19 0521  NA 136   < > 137  K 3.3*   < > 3.8  CL 101   < > 99  CO2 27   < > 32  GLUCOSE 108*   < > 104*  BUN 19   < > 21  CREATININE 1.26*   < > 1.23  CALCIUM 8.6*   < > 8.2*  MG  --    < > 2.2  AST 22  --   --   ALT 18  --   --   ALKPHOS 97  --   --   BILITOT 1.4*  --   --    < > = values in this interval not displayed.   RADIOLOGY:  Dg Chest 2 View  Result Date: 05/10/2019 CLINICAL DATA:  Shortness of breath EXAM: CHEST - 2 VIEW COMPARISON:  05/09/2019 FINDINGS: Small left pleural effusion with basilar atelectasis. Lungs are otherwise clear. Left chest wall pacemaker leads are in unchanged position. IMPRESSION: Small left pleural effusion  with basilar atelectasis. Electronically Signed   By: Ulyses Jarred M.D.   On: 05/10/2019 20:20   ASSESSMENT AND PLAN:   85 y.o. male with history of CAD status post MI and multiple stents, hypertension, hyperlipidemia, bradycardia, pacemaker placement, basal cell carcinoma presenting with chest pain and shortness of breath.  1.  Acute on chronic systolic Congestive Heart Failure- Likely volume overload - Last Echo ? EF estimated at 20 to 25% - Spironolactone (EF<35% and GFR>30 with good  monitoring follow-up) and Imdur - Entresto started per cardiology recommendations - Beta-Blockade: Carvedilol  - Diuretics: Increase Furosemide 40 IV to every 12 hours - diureses >1L negative per day until approach euvolemia / worsening renal function. - Low salt diet  - Continue to monitor renal functions - Cardiology input appreciated  2. Non-ST elevation MI-likely demand ischemia from severe LV systolic dysfunction - Status post cardiac cath no blockage noted - Continue statin with goal LDL less than 70 mg/dL - Beta-blocker as above - Continue aspirin  3.  Hypertension -BP remains stable - Continue current BP meds  4.  Hypokalemia - Repleted - Check labs in am  5.  DVT prophylaxis -subcu heparin  All the records are reviewed and case discussed with Care Management/Social Worker. Management plans discussed with the patient, family and they are in agreement.  CODE STATUS: Full Code  TOTAL TIME TAKING CARE OF THIS PATIENT: 40 minutes.   More than 50% of the time was spent in counseling/coordination of care: YES  POSSIBLE D/C IN 2 DAYS, DEPENDING ON CLINICAL CONDITION.   on 05/12/2019 at 11:02 AM  This patient was staffed with Dr. Posey Pronto, [facility] who personally evaluated patient, reviewed documentation and agreed with assessment and plan of care as above.  Rufina Falco, DNP, FNP-BC [facility] Nurse Practitioner   Between 7am to 6pm - Pager [phone]  After 6pm go to [email or web address] - Proofreader  [facility]  Office  [phone]  CC: Primary care physician; Medicine, [facility]  Note: This dictation was prepared with Dragon dictation along with smaller phrase technology. Any transcriptional errors that result from this process are unintentional.

## 2019-05-12 NOTE — Plan of Care (Signed)
  Problem: Safety: Goal: Ability to remain free from injury will improve Outcome: Progressing   Problem: Education: Goal: Ability to demonstrate management of disease process will improve Outcome: Progressing Goal: Ability to verbalize understanding of medication therapies will improve Outcome: Progressing

## 2019-05-12 NOTE — Progress Notes (Signed)
[facility] Cardiology  SUBJECTIVE: Patient sitting in bed, eating breakfast, reports feeling somewhat better, notes he developed shortness of breath typically 18 hours after morning dose of IV Lasix   Vitals:   05/11/19 1936 05/12/19 0450 05/12/19 0616 05/12/19 0754  BP: 120/70 114/63  126/71  Pulse: 64 67  62  Resp: 18 20  16   Temp: 97.7 F (36.5 C) 97.7 F (36.5 C)  97.7 F (36.5 C)  TempSrc: Oral Oral    SpO2: 98% 92%  99%  Weight:   81.1 kg   Height:         Intake/Output Summary (Last 24 hours) at 05/12/2019 6283 Last data filed at 05/12/2019 0446 Gross per 24 hour  Intake 240 ml  Output 1775 ml  Net -1535 ml      PHYSICAL EXAM  General: Well developed, well nourished, in no acute distress HEENT:  Normocephalic and atramatic Neck:  No JVD.  Lungs: Clear bilaterally to auscultation and percussion. Heart: HRRR . Normal S1 and S2 without gallops or murmurs.  Abdomen: Bowel sounds are positive, abdomen soft and non-tender  Msk:  Back normal, normal gait. Normal strength and tone for age. Extremities: No clubbing, cyanosis or edema.   Neuro: Alert and oriented X 3. Psych:  Good affect, responds appropriately   LABS: Basic Metabolic Panel: Recent Labs    05/10/19 0613  05/11/19 0410 05/12/19 0521  NA 138  --  138 137  K 3.1*   < > 3.7 3.8  CL 102  --  99 99  CO2 28  --  29 32  GLUCOSE 107*  --  117* 104*  BUN 14  --  18 21  CREATININE 1.24  --  1.13 1.23  CALCIUM 8.0*  --  8.5* 8.2*  MG 1.7  --   --  2.2   < > = values in this interval not displayed.   Liver Function Tests: Recent Labs    05/09/19 1154  AST 22  ALT 18  ALKPHOS 97  BILITOT 1.4*  PROT 6.0*  ALBUMIN 3.1*   No results for input(s): LIPASE, AMYLASE in the last 72 hours. CBC: Recent Labs    05/09/19 1034  WBC 7.2  HGB 13.8  HCT 42.0  MCV 87.5  PLT 146*   Cardiac Enzymes: Recent Labs    05/09/19 1803 05/09/19 2051 05/10/19 0256  TROPONINI 0.09* 0.09* 0.09*   BNP: Invalid  input(s): POCBNP D-Dimer: No results for input(s): DDIMER in the last 72 hours. Hemoglobin A1C: No results for input(s): HGBA1C in the last 72 hours. Fasting Lipid Panel: Recent Labs    05/10/19 0256  CHOL 104  HDL 44  LDLCALC 42  TRIG 90  CHOLHDL 2.4   Thyroid Function Tests: No results for input(s): TSH, T4TOTAL, T3FREE, THYROIDAB in the last 72 hours.  Invalid input(s): FREET3 Anemia Panel: No results for input(s): VITAMINB12, FOLATE, FERRITIN, TIBC, IRON, RETICCTPCT in the last 72 hours.  Dg Chest 2 View  Result Date: 05/10/2019 CLINICAL DATA:  Shortness of breath EXAM: CHEST - 2 VIEW COMPARISON:  05/09/2019 FINDINGS: Small left pleural effusion with basilar atelectasis. Lungs are otherwise clear. Left chest wall pacemaker leads are in unchanged position. IMPRESSION: Small left pleural effusion with basilar atelectasis. Electronically Signed   By: Ulyses Jarred M.D.   On: 05/10/2019 20:20     Echo LVEF 20 to 25%, moderate aortic stenosis  TELEMETRY: Sinus rhythm:  ASSESSMENT AND PLAN:  Active Problems:   Chest pain  1.  Acute on chronic systolic congestive heart failure, improved but still symptomatic 2.  Patent stents proximal mid RCA, proximal left circumflex by cardiac cath 05/09/2019 3.  Ischemic cardiomyopathy, LVEF 20 to 25% 4.  Underlying COPD  Recommendations  1.  Continue diuresis, increase furosemide 40 mg IV every 12 2.  Carefully monitor renal status 3.  Start Entresto 24-26 mg   Isaias Cowman, MD, PhD, [facility] 05/12/2019 9:27 AM

## 2019-05-13 LAB — BASIC METABOLIC PANEL
Anion gap: 8 (ref 5–15)
BUN: 25 mg/dL — ABNORMAL HIGH (ref 8–23)
CO2: 35 mmol/L — ABNORMAL HIGH (ref 22–32)
Calcium: 8.5 mg/dL — ABNORMAL LOW (ref 8.9–10.3)
Chloride: 96 mmol/L — ABNORMAL LOW (ref 98–111)
Creatinine, Ser: 1.36 mg/dL — ABNORMAL HIGH (ref 0.61–1.24)
GFR calc Af Amer: 55 mL/min — ABNORMAL LOW (ref >60)
GFR calc non Af Amer: 47 mL/min — ABNORMAL LOW (ref >60)
Glucose, Bld: 109 mg/dL — ABNORMAL HIGH (ref 70–99)
Potassium: 4.6 mmol/L (ref 3.5–5.1)
Sodium: 139 mmol/L (ref 135–145)

## 2019-05-13 LAB — MAGNESIUM: Magnesium: 2.5 mg/dL — ABNORMAL HIGH (ref 1.7–2.4)

## 2019-05-13 MED ORDER — FUROSEMIDE 10 MG/ML IJ SOLN
40.0000 mg | Freq: Two times a day (BID) | INTRAMUSCULAR | Status: DC
  Administered 2019-05-13 – 2019-05-14 (×2): 40 mg via INTRAVENOUS
  Filled 2019-05-13 (×3): qty 4

## 2019-05-13 NOTE — Progress Notes (Addendum)
BP is 93/64.  Patient is sitting upright in bed.  Says he feels lightheaded, he is eating dinner. Addendum: 1800. Dr. Posey Pronto agrees to hold Carvedilol and lasix

## 2019-05-13 NOTE — Progress Notes (Signed)
Pharmacy Electrolyte Monitoring Consult:  Pharmacy consulted to assist in monitoring and replacing electrolytes in this 85 y.o. male admitted on 05/09/2019 with Chest Pain; Shortness of Breath; Arm Pain; and Dizziness   Labs:  Sodium (mmol/L)  Date Value  05/13/2019 139  03/28/2014 137   Potassium (mmol/L)  Date Value  05/13/2019 4.6  03/28/2014 4.2   Magnesium (mg/dL)  Date Value  05/13/2019 2.5 (H)   Calcium (mg/dL)  Date Value  05/13/2019 8.5 (L)   Calcium, Total (mg/dL)  Date Value  03/28/2014 8.8   Albumin (g/dL)  Date Value  05/09/2019 3.1 (L)    Assessment/Plan: Furosemide increased to 40mg  IV Q12hr. Patient continued on spironolactone 25mg  daily.   No replacement warranted. Watch serum creatinine.   Will obtain electrolytes with am labs.   Pharmacy will continue to monitor and adjust per consult.    Kristopher Fernandez L 05/13/2019

## 2019-05-13 NOTE — Progress Notes (Signed)
[facility] Cardiology  SUBJECTIVE: Patient laying in bed, reports breathing easier   Vitals:   05/12/19 2214 05/12/19 2335 05/13/19 0427 05/13/19 0758  BP: 99/60 110/66 115/64 106/62  Pulse: 67 66 60 63  Resp:   18 16  Temp:   (!) 97.4 F (36.3 C) 98.1 F (36.7 C)  TempSrc:   Oral Oral  SpO2: 96% 97% 98% 95%  Weight:   81.1 kg   Height:         Intake/Output Summary (Last 24 hours) at 05/13/2019 0848 Last data filed at 05/12/2019 2100 Gross per 24 hour  Intake 954 ml  Output 1600 ml  Net -646 ml      PHYSICAL EXAM  General: Well developed, well nourished, in no acute distress HEENT:  Normocephalic and atramatic Neck:  No JVD.  Lungs: Clear bilaterally to auscultation and percussion. Heart: HRRR . Normal S1 and S2 without gallops or murmurs.  Abdomen: Bowel sounds are positive, abdomen soft and non-tender  Msk:  Back normal, normal gait. Normal strength and tone for age. Extremities: No clubbing, cyanosis or edema.   Neuro: Alert and oriented X 3. Psych:  Good affect, responds appropriately   LABS: Basic Metabolic Panel: Recent Labs    05/12/19 0521 05/13/19 0622 05/13/19 0721  NA 137 139  --   K 3.8 4.6  --   CL 99 96*  --   CO2 32 35*  --   GLUCOSE 104* 109*  --   BUN 21 25*  --   CREATININE 1.23 1.36*  --   CALCIUM 8.2* 8.5*  --   MG 2.2  --  2.5*   Liver Function Tests: No results for input(s): AST, ALT, ALKPHOS, BILITOT, PROT, ALBUMIN in the last 72 hours. No results for input(s): LIPASE, AMYLASE in the last 72 hours. CBC: No results for input(s): WBC, NEUTROABS, HGB, HCT, MCV, PLT in the last 72 hours. Cardiac Enzymes: No results for input(s): CKTOTAL, CKMB, CKMBINDEX, TROPONINI in the last 72 hours. BNP: Invalid input(s): POCBNP D-Dimer: No results for input(s): DDIMER in the last 72 hours. Hemoglobin A1C: No results for input(s): HGBA1C in the last 72 hours. Fasting Lipid Panel: No results for input(s): CHOL, HDL, LDLCALC, TRIG, CHOLHDL,  LDLDIRECT in the last 72 hours. Thyroid Function Tests: No results for input(s): TSH, T4TOTAL, T3FREE, THYROIDAB in the last 72 hours.  Invalid input(s): FREET3 Anemia Panel: No results for input(s): VITAMINB12, FOLATE, FERRITIN, TIBC, IRON, RETICCTPCT in the last 72 hours.  No results found.   Echo LVEF 20 to 25%, moderate aortic stenosis  TELEMETRY: Sinus rhythm:  ASSESSMENT AND PLAN:  Active Problems:   Chest pain    1.  Acute on chronic systolic congestive heart failure, slowly improving with ongoing diuresis 2.  Ischemic cardiomyopathy, LVEF 20 to 25%, started both spironolactone and Entresto, tolerated well 3.  Patent stents proximal mid RCA, proximal left circumflex by cardiac cath 05/09/2019 4.  Underlying COPD  Recommendations  1.  Continue diuresis 2.  Carefully monitor renal status 3.  Continue current CHF regimen   Isaias Cowman, MD, PhD, [facility] 05/13/2019 8:48 AM

## 2019-05-13 NOTE — Progress Notes (Addendum)
[place] at [place] NAME: Kristopher Fernandez    MR#:  [id number]  DATE OF BIRTH:  May 29, 1932  SUBJECTIVE:   Chief Complaint  Patient presents with  . Chest Pain  . Shortness of Breath  . Arm Pain  . Dizziness   Patient up in the chair with assistance. He report that his breathing is much better with increase in IV lasix. He is concerned about his blood pressure since starting Entresto, however his blood pressure is note to be >643 systolic. Denies other concerns.  REVIEW OF SYSTEMS:  Review of Systems  Constitutional: Negative for chills, fever, malaise/fatigue and weight loss.  HENT: Positive for hearing loss. Negative for congestion and sore throat.   Eyes: Negative for blurred vision and double vision.  Respiratory: Positive for shortness of breath and wheezing. Negative for cough.   Cardiovascular: Positive for orthopnea. Negative for chest pain, palpitations and leg swelling.  Gastrointestinal: Negative for abdominal pain, diarrhea, nausea and vomiting.  Genitourinary: Positive for frequency. Negative for dysuria and urgency.  Musculoskeletal: Negative for myalgias.  Skin: Negative for rash.  Neurological: Negative for dizziness, sensory change, speech change, focal weakness and headaches.  Psychiatric/Behavioral: Negative for depression. The patient is nervous/anxious.     DRUG ALLERGIES:  No Known Allergies VITALS:  Blood pressure 106/62, pulse 63, temperature 98.1 F (36.7 C), temperature source Oral, resp. rate 16, height 6' (1.829 m), weight 81.1 kg, SpO2 95 %. PHYSICAL EXAMINATION:   GENERAL:  85 y.o.-year-old patient lying in the bed with no acute distress.  EYES: Pupils equal, round, reactive to light and accommodation. No scleral icterus. Extraocular muscles intact.  HEENT: Head atraumatic, normocephalic. Oropharynx and nasopharynx clear.  NECK:  Supple, no jugular venous distention. No thyroid enlargement, no tenderness.   LUNGS: Decrease breath sounds bilaterally, mild wheezing improved from yesterday. No use of accessory muscles of respiration.  CARDIOVASCULAR: S1, S2 normal. No murmurs, rubs, or gallops.  ABDOMEN: Soft, nontender, nondistended. Bowel sounds present. No organomegaly or mass.  EXTREMITIES: Trace pedal edema, No cyanosis, or clubbing.  NEUROLOGIC: Cranial nerves II through XII are intact. Muscle strength 5/5 in all extremities. Sensation intact. Gait not checked.  PSYCHIATRIC: The patient is alert and oriented x 3.  SKIN: No obvious rash, lesion, or ulcer.   DATA REVIEWED:  LABORATORY PANEL:  Male CBC Recent Labs  Lab 05/09/19 1034  WBC 7.2  HGB 13.8  HCT 42.0  PLT 146*   ------------------------------------------------------------------------------------------------------------------ Chemistries  Recent Labs  Lab 05/09/19 1154  05/13/19 0622 05/13/19 0721  NA 136   < > 139  --   K 3.3*   < > 4.6  --   CL 101   < > 96*  --   CO2 27   < > 35*  --   GLUCOSE 108*   < > 109*  --   BUN 19   < > 25*  --   CREATININE 1.26*   < > 1.36*  --   CALCIUM 8.6*   < > 8.5*  --   MG  --    < >  --  2.5*  AST 22  --   --   --   ALT 18  --   --   --   ALKPHOS 97  --   --   --   BILITOT 1.4*  --   --   --    < > = values in this interval not  displayed.   RADIOLOGY:  No results found. ASSESSMENT AND PLAN:   85 y.o. male with history of CAD status post MI and multiple stents, hypertension, hyperlipidemia, bradycardia, pacemaker placement, basal cell carcinoma presenting with chest pain and shortness of breath.  1.  Acute on chronic systolic Congestive Heart Failure- Likely volume overload also with underlying anxiety - EF estimated at 20 to 25% - Spironolactone (EF<35% and GFR>30 with good monitoring follow-up) and Imdur - Continue Entresto per cardiology recommendations - Beta-Blockade: Carvedilol  - Diuretics: Continue Furosemide 40 IV to every 12 hours - diureses >1L negative per  day until approach euvolemia / worsening renal function. - Low salt diet  - Continue to monitor renal functions - Cardiology input appreciated  2. Non-ST elevation MI-likely demand ischemia from severe LV systolic dysfunction - Status post cardiac cath no blockage noted - Continue statin with goal LDL less than 70 mg/dL - Beta-blocker as above - Continue aspirin  3.  Hypertension -BP remains stable - Continue current BP meds  4.  Hypokalemia - Improved, mag 2.5 - Check labs in am  5.  DVT prophylaxis -subcu heparin  6. Anxiety - Continue Xanax as needed  All the records are reviewed and case discussed with Care Management/Social Worker. Management plans discussed with the patient, family and they are in agreement.  CODE STATUS: Full Code  TOTAL TIME TAKING CARE OF THIS PATIENT: 42 minutes.   More than 50% of the time was spent in counseling/coordination of care: YES  POSSIBLE D/C IN 2 DAYS, DEPENDING ON CLINICAL CONDITION.   on 05/13/2019 at 12:17 PM  This patient was staffed with Dr. Posey Pronto, [facility] who personally evaluated patient, reviewed documentation and agreed with assessment and plan of care as above.  Rufina Falco, DNP, FNP-BC [facility] Nurse Practitioner   Between 7am to 6pm - Pager [phone]  After 6pm go to [email or web address] - Proofreader  [facility]  Office  [phone]  CC: Primary care physician; Medicine, [facility]  Note: This dictation was prepared with Dragon dictation along with smaller phrase technology. Any transcriptional errors that result from this process are unintentional.

## 2019-05-14 LAB — BASIC METABOLIC PANEL
Anion gap: 10 (ref 5–15)
BUN: 33 mg/dL — ABNORMAL HIGH (ref 8–23)
CO2: 32 mmol/L (ref 22–32)
Calcium: 8.6 mg/dL — ABNORMAL LOW (ref 8.9–10.3)
Chloride: 94 mmol/L — ABNORMAL LOW (ref 98–111)
Creatinine, Ser: 1.42 mg/dL — ABNORMAL HIGH (ref 0.61–1.24)
GFR calc Af Amer: 52 mL/min — ABNORMAL LOW (ref >60)
GFR calc non Af Amer: 45 mL/min — ABNORMAL LOW (ref >60)
Glucose, Bld: 109 mg/dL — ABNORMAL HIGH (ref 70–99)
Potassium: 4.3 mmol/L (ref 3.5–5.1)
Sodium: 136 mmol/L (ref 135–145)

## 2019-05-14 MED ORDER — FUROSEMIDE 40 MG PO TABS
40.0000 mg | ORAL_TABLET | Freq: Two times a day (BID) | ORAL | Status: DC
  Administered 2019-05-14 – 2019-05-15 (×2): 40 mg via ORAL
  Filled 2019-05-14 (×2): qty 1

## 2019-05-14 NOTE — Progress Notes (Signed)
SBP still under 100.  Will hold BP medications until BP is more stable.

## 2019-05-14 NOTE — Progress Notes (Signed)
After giving furosemide and carvedilol this AM, BP started running soft with SBP in 90's.  Will continue to give additional medications throughout the day, as able.

## 2019-05-14 NOTE — Progress Notes (Signed)
[facility] Cardiology  SUBJECTIVE: Having a bad day   Vitals:   05/13/19 2230 05/14/19 0422 05/14/19 0803 05/14/19 0927  BP: (!) 96/53 104/63 114/67 (!) 93/57  Pulse: 64 70 66   Resp:  18 18   Temp:  (!) 97.5 F (36.4 C) 98.3 F (36.8 C)   TempSrc:  Oral Oral   SpO2: 97% 97% 99%   Weight:      Height:         Intake/Output Summary (Last 24 hours) at 05/14/2019 2426 Last data filed at 05/14/2019 0820 Gross per 24 hour  Intake 720 ml  Output 1475 ml  Net -755 ml      PHYSICAL EXAM  General: Well developed, well nourished, in no acute distress HEENT:  Normocephalic and atramatic Neck:  No JVD.  Lungs: Clear bilaterally to auscultation and percussion. Heart: HRRR . Normal S1 and S2 without gallops or murmurs.  Abdomen: Bowel sounds are positive, abdomen soft and non-tender  Msk:  Back normal, normal gait. Normal strength and tone for age. Extremities: No clubbing, cyanosis or edema.   Neuro: Alert and oriented X 3. Psych:  Good affect, responds appropriately   LABS: Basic Metabolic Panel: Recent Labs    05/12/19 0521 05/13/19 0622 05/13/19 0721 05/14/19 0609  NA 137 139  --  136  K 3.8 4.6  --  4.3  CL 99 96*  --  94*  CO2 32 35*  --  32  GLUCOSE 104* 109*  --  109*  BUN 21 25*  --  33*  CREATININE 1.23 1.36*  --  1.42*  CALCIUM 8.2* 8.5*  --  8.6*  MG 2.2  --  2.5*  --    Liver Function Tests: No results for input(s): AST, ALT, ALKPHOS, BILITOT, PROT, ALBUMIN in the last 72 hours. No results for input(s): LIPASE, AMYLASE in the last 72 hours. CBC: No results for input(s): WBC, NEUTROABS, HGB, HCT, MCV, PLT in the last 72 hours. Cardiac Enzymes: No results for input(s): CKTOTAL, CKMB, CKMBINDEX, TROPONINI in the last 72 hours. BNP: Invalid input(s): POCBNP D-Dimer: No results for input(s): DDIMER in the last 72 hours. Hemoglobin A1C: No results for input(s): HGBA1C in the last 72 hours. Fasting Lipid Panel: No results for input(s): CHOL, HDL, LDLCALC,  TRIG, CHOLHDL, LDLDIRECT in the last 72 hours. Thyroid Function Tests: No results for input(s): TSH, T4TOTAL, T3FREE, THYROIDAB in the last 72 hours.  Invalid input(s): FREET3 Anemia Panel: No results for input(s): VITAMINB12, FOLATE, FERRITIN, TIBC, IRON, RETICCTPCT in the last 72 hours.  No results found.   Echo LVEF 20 to 25%, moderate aortic stenosis  TELEMETRY: Sinus rhythm:  ASSESSMENT AND PLAN:  Active Problems:   Chest pain    1.  Acute on chronic systolic congestive heart failure, improved after diuresis 2.  Ischemic cardiomyopathy, LVEF 20 to 25%, started on spironolactone and Entresto 3.  Patent stents proximal mid RCA, proximal left circumflex by cardiac cath 05/09/2019 4.  Symptomatic mild hypotension secondary to diuresis, Ellyn Hack new CHF medications including spironolactone and Entresto 5.  Underlying COPD  Recommendations  1.  Continue current medications 2.  Change furosemide IV to p.o. 3.  Continue Spironolactone and Entresto as tolerated 4.  Had lengthy discussion with patient about realistic expectations, and patient understands the terminal nature of his current condition.  He will continue to take current CHF regimen as tolerated.   Isaias Cowman, MD, PhD, [facility] 05/14/2019 9:58 AM

## 2019-05-14 NOTE — Care Management Important Message (Signed)
Important Message  Patient Details  Name: Kristopher Fernandez MRN: [id number] Date of Birth: [birth year]   Medicare Important Message Given:  Yes    Dannette Barbara 05/14/2019, 11:45 AM

## 2019-05-14 NOTE — Progress Notes (Signed)
Pharmacy Electrolyte Monitoring Consult:  Pharmacy consulted to assist in monitoring and replacing electrolytes in this 85 y.o. male admitted on 05/09/2019 with Chest Pain; Shortness of Breath; Arm Pain; and Dizziness   Labs:  Sodium (mmol/L)  Date Value  05/14/2019 136  03/28/2014 137   Potassium (mmol/L)  Date Value  05/14/2019 4.3  03/28/2014 4.2   Magnesium (mg/dL)  Date Value  05/13/2019 2.5 (H)   Calcium (mg/dL)  Date Value  05/14/2019 8.6 (L)   Calcium, Total (mg/dL)  Date Value  03/28/2014 8.8   Albumin (g/dL)  Date Value  05/09/2019 3.1 (L)    Assessment/Plan: K 4.3 Scr 1.42  Furosemide 40mg  PO Q12hr. Patient continued on spironolactone 25mg  daily and Entresto  No replacement warranted at this time. Watch serum creatinine.   Will obtain electrolytes with am labs.   Pharmacy will continue to monitor and adjust per consult.    Kellianne Ek A 05/14/2019

## 2019-05-14 NOTE — Progress Notes (Signed)
[place] at [place] NAME: Kristopher Fernandez    MR#:  [id number]  DATE OF BIRTH:  1932/12/19  SUBJECTIVE:   Chief Complaint  Patient presents with  . Chest Pain  . Shortness of Breath  . Arm Pain  . Dizziness   Patient sitting up in bed. He is complaining of dizziness as if the room is spinning whenever he opens his eyes. Patient state dizziness/ lightheadedness started yesterday and has been intermittent. Patient's blood pressure has been soft so Coreg and Lasix was held. Per patient, since holding the lasix, he has been SOB.  REVIEW OF SYSTEMS:  Review of Systems  Constitutional: Negative for chills, fever, malaise/fatigue and weight loss.  HENT: Positive for hearing loss. Negative for congestion and sore throat.   Eyes: Negative for blurred vision and double vision.  Respiratory: Positive for shortness of breath and wheezing. Negative for cough.   Cardiovascular: Positive for orthopnea. Negative for chest pain, palpitations and leg swelling.  Gastrointestinal: Negative for abdominal pain, diarrhea, nausea and vomiting.  Genitourinary: Positive for frequency. Negative for dysuria and urgency.  Musculoskeletal: Negative for myalgias.  Skin: Negative for rash.  Neurological: Negative for dizziness, sensory change, speech change, focal weakness and headaches.  Psychiatric/Behavioral: Negative for depression. The patient is nervous/anxious.    DRUG ALLERGIES:  No Known Allergies VITALS:  Blood pressure (!) 93/57, pulse 66, temperature 98.3 F (36.8 C), temperature source Oral, resp. rate 18, height 6' (1.829 m), weight 81.1 kg, SpO2 99 %. PHYSICAL EXAMINATION:   GENERAL:  85 y.o.-year-old patient lying in the bed with no acute distress.  EYES: Pupils equal, round, reactive to light and accommodation. No scleral icterus. Extraocular muscles intact.  HEENT: Head atraumatic, normocephalic. Oropharynx and nasopharynx clear.  NECK:  Supple, no  jugular venous distention. No thyroid enlargement, no tenderness.  LUNGS: Decrease breath sounds bilaterally, mild wheezing improved from yesterday. No use of accessory muscles of respiration.  CARDIOVASCULAR: S1, S2 normal. No murmurs, rubs, or gallops.  ABDOMEN: Soft, nontender, nondistended. Bowel sounds present. No organomegaly or mass.  EXTREMITIES: Trace pedal edema, No cyanosis, or clubbing.  NEUROLOGIC: Cranial nerves II through XII are intact. Muscle strength 5/5 in all extremities. Sensation intact. Gait not checked.  PSYCHIATRIC: The patient is alert and oriented x 3.  SKIN: No obvious rash, lesion, or ulcer.   DATA REVIEWED:  LABORATORY PANEL:  Male CBC Recent Labs  Lab 05/09/19 1034  WBC 7.2  HGB 13.8  HCT 42.0  PLT 146*   ------------------------------------------------------------------------------------------------------------------ Chemistries  Recent Labs  Lab 05/09/19 1154  05/13/19 0721 05/14/19 0609  NA 136   < >  --  136  K 3.3*   < >  --  4.3  CL 101   < >  --  94*  CO2 27   < >  --  32  GLUCOSE 108*   < >  --  109*  BUN 19   < >  --  33*  CREATININE 1.26*   < >  --  1.42*  CALCIUM 8.6*   < >  --  8.6*  MG  --    < > 2.5*  --   AST 22  --   --   --   ALT 18  --   --   --   ALKPHOS 97  --   --   --   BILITOT 1.4*  --   --   --    < > =  values in this interval not displayed.   RADIOLOGY:  No results found. ASSESSMENT AND PLAN:   85 y.o. male with history of CAD status post MI and multiple stents, hypertension, hyperlipidemia, bradycardia, pacemaker placement, basal cell carcinoma presenting with chest pain and shortness of breath.  1.  Acute on chronic systolic Congestive Heart Failure- Likely volume overload also with underlying anxiety - EF estimated at 20 to 25% - Spironolactone (EF<35% and GFR>30 with good monitoring follow-up) and Imdur - Continue Entresto per cardiology recommendations - Beta-Blockade: Carvedilol  - Diuretics: Change  Furosemide 40 mg BID to oral - diureses >1L negative per day until approach euvolemia / worsening renal function. - Low salt diet  - Continue to monitor renal functions - Cardiology input appreciated  2. Non-ST elevation MI-likely demand ischemia from severe LV systolic dysfunction - Status post cardiac cath no blockage noted - Continue statin with goal LDL less than 70 mg/dL - Beta-blocker as above - Continue aspirin  3.  Hypertension - Now with low bp - Changed Lasix to PO as above - Continue current BP meds - Continue to monitor  4.  Hypokalemia - Improved, mag 2.5 - Check labs in am  5.  DVT prophylaxis -subcu heparin  6. Anxiety - Continue Xanax as needed  All the records are reviewed and case discussed with Care Management/Social Worker. Management plans discussed with the patient, family and they are in agreement.  CODE STATUS: Full Code  TOTAL TIME TAKING CARE OF THIS PATIENT: 42 minutes.   More than 50% of the time was spent in counseling/coordination of care: YES  POSSIBLE D/C IN 2 DAYS, DEPENDING ON CLINICAL CONDITION.   on 05/14/2019 at 11:42 AM  This patient was staffed with Dr. Posey Pronto, [facility] who personally evaluated patient, reviewed documentation and agreed with assessment and plan of care as above.  Rufina Falco, DNP, FNP-BC [facility] Nurse Practitioner   Between 7am to 6pm - Pager [phone]  After 6pm go to [email or web address] - Proofreader  [facility]  Office  [phone]  CC: Primary care physician; Medicine, [facility]  Note: This dictation was prepared with Dragon dictation along with smaller phrase technology. Any transcriptional errors that result from this process are unintentional.

## 2019-05-15 ENCOUNTER — Encounter: Payer: Self-pay | Admitting: *Deleted

## 2019-05-15 DIAGNOSIS — F4320 Adjustment disorder, unspecified: Secondary | ICD-10-CM

## 2019-05-15 DIAGNOSIS — F418 Other specified anxiety disorders: Secondary | ICD-10-CM

## 2019-05-15 DIAGNOSIS — F4321 Adjustment disorder with depressed mood: Secondary | ICD-10-CM

## 2019-05-15 DIAGNOSIS — R079 Chest pain, unspecified: Secondary | ICD-10-CM

## 2019-05-15 LAB — BASIC METABOLIC PANEL
Anion gap: 7 (ref 5–15)
BUN: 34 mg/dL — ABNORMAL HIGH (ref 8–23)
CO2: 33 mmol/L — ABNORMAL HIGH (ref 22–32)
Calcium: 8.3 mg/dL — ABNORMAL LOW (ref 8.9–10.3)
Chloride: 97 mmol/L — ABNORMAL LOW (ref 98–111)
Creatinine, Ser: 1.44 mg/dL — ABNORMAL HIGH (ref 0.61–1.24)
GFR calc Af Amer: 51 mL/min — ABNORMAL LOW (ref >60)
GFR calc non Af Amer: 44 mL/min — ABNORMAL LOW (ref >60)
Glucose, Bld: 102 mg/dL — ABNORMAL HIGH (ref 70–99)
Potassium: 4.6 mmol/L (ref 3.5–5.1)
Sodium: 137 mmol/L (ref 135–145)

## 2019-05-15 MED ORDER — CLONAZEPAM 0.5 MG PO TABS: 0.2500 mg | ORAL_TABLET | Freq: Two times a day (BID) | ORAL | Status: DC

## 2019-05-15 MED ORDER — FUROSEMIDE 40 MG PO TABS
40.0000 mg | ORAL_TABLET | Freq: Two times a day (BID) | ORAL | Status: DC
  Administered 2019-05-17: 08:00:00 40 mg via ORAL
  Filled 2019-05-15: qty 1

## 2019-05-15 NOTE — Plan of Care (Signed)
O2 Sats >90% on room air. OOB with PT and to recliner for most of shift, no c/o pain, tolerating diet, good appetite.   All PO meds given this AM according to parameters. SBP in 80s upon recheck. Patient states he feels exhausted, MD aware. Will continue to monitor.   Problem: Activity: Goal: Ability to tolerate increased activity will improve Outcome: Progressing   Problem: Education: Goal: Knowledge of General Education information will improve Description Including pain rating scale, medication(s)/side effects and non-pharmacologic comfort measures Outcome: Progressing   Problem: Activity: Goal: Risk for activity intolerance will decrease Outcome: Progressing   Problem: Coping: Goal: Level of anxiety will decrease Outcome: Progressing   Problem: Elimination: Goal: Will not experience complications related to urinary retention Outcome: Progressing   Problem: Pain Managment: Goal: General experience of comfort will improve Outcome: Progressing   Problem: Safety: Goal: Ability to remain free from injury will improve Outcome: Progressing

## 2019-05-15 NOTE — Progress Notes (Signed)
[facility] NAME: Josie Mesa    MR#:  [id number]  DATE OF BIRTH:  June 21, 1932  SUBJECTIVE:   Chief Complaint  Patient presents with   Chest Pain   Shortness of Breath   Arm Pain   Dizziness   Patient sitting up in bed. He is still complaining of dizziness as if the room is spinning whenever he opens his eyes. BP improved today. He refused Entresto last night due to concerns of hypotension. SOB somewhat better today per patient as long as " I am getting my lasix".  REVIEW OF SYSTEMS:  Review of Systems  Constitutional: Negative for chills, fever, malaise/fatigue and weight loss.  HENT: Positive for hearing loss. Negative for congestion and sore throat.   Eyes: Negative for blurred vision and double vision.  Respiratory: Positive for shortness of breath and wheezing. Negative for cough.   Cardiovascular: Positive for orthopnea. Negative for chest pain, palpitations and leg swelling.  Gastrointestinal: Negative for abdominal pain, diarrhea, nausea and vomiting.  Genitourinary: Positive for frequency. Negative for dysuria and urgency.  Musculoskeletal: Negative for myalgias.  Skin: Negative for rash.  Neurological: Negative for dizziness, sensory change, speech change, focal weakness and headaches.  Psychiatric/Behavioral: Negative for depression. The patient is nervous/anxious.    DRUG ALLERGIES:  No Known Allergies VITALS:  Blood pressure (!) 93/55, pulse (!) 59, temperature 98.5 F (36.9 C), temperature source Oral, resp. rate 20, height 6' (1.829 m), weight 79.8 kg, SpO2 95 %. PHYSICAL EXAMINATION:   GENERAL:  85 y.o.-year-old patient lying in the bed with no acute distress.  EYES: Pupils equal, round, reactive to light and accommodation. No scleral icterus. Extraocular muscles intact.  HEENT: Head atraumatic, normocephalic. Oropharynx and nasopharynx clear.  NECK:  Supple, no jugular venous distention. No thyroid  enlargement, no tenderness.  LUNGS: Decrease breath sounds bilaterally, mild wheezing improved from yesterday. No use of accessory muscles of respiration.  CARDIOVASCULAR: S1, S2 normal. No murmurs, rubs, or gallops.  ABDOMEN: Soft, nontender, nondistended. Bowel sounds present. No organomegaly or mass.  EXTREMITIES: Trace pedal edema, No cyanosis, or clubbing.  NEUROLOGIC: Cranial nerves II through XII are intact. Muscle strength 5/5 in all extremities. Sensation intact. Gait not checked.  PSYCHIATRIC: The patient is alert and oriented x 3.  SKIN: No obvious rash, lesion, or ulcer.   DATA REVIEWED:  LABORATORY PANEL:  Male CBC Recent Labs  Lab 05/09/19 1034  WBC 7.2  HGB 13.8  HCT 42.0  PLT 146*   ------------------------------------------------------------------------------------------------------------------ Chemistries  Recent Labs  Lab 05/09/19 1154  05/13/19 0721  05/15/19 0403  NA 136   < >  --    < > 137  K 3.3*   < >  --    < > 4.6  CL 101   < >  --    < > 97*  CO2 27   < >  --    < > 33*  GLUCOSE 108*   < >  --    < > 102*  BUN 19   < >  --    < > 34*  CREATININE 1.26*   < >  --    < > 1.44*  CALCIUM 8.6*   < >  --    < > 8.3*  MG  --    < > 2.5*  --   --   AST 22  --   --   --   --  ALT 18  --   --   --   --   ALKPHOS 97  --   --   --   --   BILITOT 1.4*  --   --   --   --    < > = values in this interval not displayed.   RADIOLOGY:  No results found. ASSESSMENT AND PLAN:   85 y.o. male with history of CAD status post MI and multiple stents, hypertension, hyperlipidemia, bradycardia, pacemaker placement, basal cell carcinoma presenting with chest pain and shortness of breath.  1.  Acute on chronic systolic Congestive Heart Failure- Likely volume overload also with underlying anxiety - EF estimated at 20 to 25% - Spironolactone (EF<35% and GFR>30 with good monitoring follow-up) and Imdur - Continue Entresto per cardiology recommendations -  Beta-Blockade: Carvedilol  - Diuretics: Furosemide 40 mg BID to oral - diureses >1L negative per day approaching euvolemia. Renal function at baseline - Low salt diet  - Continue to monitor renal functions - Cardiology input appreciated  2. Non-ST elevation MI-likely demand ischemia from severe LV systolic dysfunction - Status post cardiac cath no blockage noted - Continue statin with goal LDL less than 70 mg/dL - Beta-blocker as above - Continue aspirin  3.  Hypertension - BP low yesterday now seemed to have improved - Lasix to PO as above - Continue current BP meds - Continue to monitor  4.  Hypokalemia - Improved, mag 2.5 - Check labs in am  5.  DVT prophylaxis -subcu heparin  6. Anxiety - Start low dose long acting Benzo for better control - Psych consult, patient recently lost his wife and has not been able to cope with his medical condition well.   All the records are reviewed and case discussed with Care Management/Social Worker. Management plans discussed with the patient, family and they are in agreement.  CODE STATUS: DNR  TOTAL TIME TAKING CARE OF THIS PATIENT: 42 minutes.   More than 50% of the time was spent in counseling/coordination of care: YES  POSSIBLE D/C IN 2 DAYS, DEPENDING ON CLINICAL CONDITION.   on 05/15/2019 at 1:11 PM  This patient was staffed with Dr. Posey Pronto, [facility] who personally evaluated patient, reviewed documentation and agreed with assessment and plan of care as above.  Rufina Falco, DNP, FNP-BC [facility] Nurse Practitioner   Between 7am to 6pm - Pager [phone]  After 6pm go to [email or web address] - Proofreader  [facility]  Office  [phone]  CC: Primary care physician; Medicine, [facility]  Note: This dictation was prepared with Dragon dictation along with smaller phrase technology. Any transcriptional errors that result from this process are unintentional.

## 2019-05-15 NOTE — Plan of Care (Signed)
  Problem: Health Behavior/Discharge Planning: Goal: Ability to safely manage health-related needs after discharge will improve Outcome: Progressing   Problem: Clinical Measurements: Goal: Respiratory complications will improve Outcome: Progressing   Problem: Coping: Goal: Level of anxiety will decrease Outcome: Progressing   Problem: Elimination: Goal: Will not experience complications related to urinary retention Outcome: Progressing   Problem: Education: Goal: Ability to demonstrate management of disease process will improve Outcome: Progressing Goal: Ability to verbalize understanding of medication therapies will improve Outcome: Progressing

## 2019-05-15 NOTE — Progress Notes (Signed)
[facility]  SUBJECTIVE: Patient laying flat in bed, ports intermittent shortness of breath   Vitals:   05/14/19 1817 05/14/19 2247 05/15/19 0327 05/15/19 0821  BP: (!) 98/52 (!) 102/50 110/62 126/70  Pulse: 64 68 64 71  Resp: 20     Temp: (!) 97.4 F (36.3 C)  (!) 97.5 F (36.4 C) 98.5 F (36.9 C)  TempSrc: Oral  Oral Oral  SpO2: (!) 89% 97% 97% 95%  Weight:   79.8 kg   Height:         Intake/Output Summary (Last 24 hours) at 05/15/2019 0867 Last data filed at 05/15/2019 6195 Gross per 24 hour  Intake 720 ml  Output 750 ml  Net -30 ml      PHYSICAL EXAM  General: Well developed, well nourished, in no acute distress HEENT:  Normocephalic and atramatic Neck:  No JVD.  Lungs: Clear bilaterally to auscultation and percussion. Heart: HRRR . Normal S1 and S2 without gallops or murmurs.  Abdomen: Bowel sounds are positive, abdomen soft and non-tender  Msk:  Back normal, normal gait. Normal strength and tone for age. Extremities: No clubbing, cyanosis or edema.   Neuro: Alert and oriented X 3. Psych:  Good affect, responds appropriately   LABS: Basic Metabolic Panel: Recent Labs    05/13/19 0721 05/14/19 0609 05/15/19 0403  NA  --  136 137  K  --  4.3 4.6  CL  --  94* 97*  CO2  --  32 33*  GLUCOSE  --  109* 102*  BUN  --  33* 34*  CREATININE  --  1.42* 1.44*  CALCIUM  --  8.6* 8.3*  MG 2.5*  --   --    Liver Function Tests: No results for input(s): AST, ALT, ALKPHOS, BILITOT, PROT, ALBUMIN in the last 72 hours. No results for input(s): LIPASE, AMYLASE in the last 72 hours. CBC: No results for input(s): WBC, NEUTROABS, HGB, HCT, MCV, PLT in the last 72 hours. Cardiac Enzymes: No results for input(s): CKTOTAL, CKMB, CKMBINDEX, TROPONINI in the last 72 hours. BNP: Invalid input(s): POCBNP D-Dimer: No results for input(s): DDIMER in the last 72 hours. Hemoglobin A1C: No results for input(s): HGBA1C in the last 72 hours. Fasting Lipid Panel: No results  for input(s): CHOL, HDL, LDLCALC, TRIG, CHOLHDL, LDLDIRECT in the last 72 hours. Thyroid Function Tests: No results for input(s): TSH, T4TOTAL, T3FREE, THYROIDAB in the last 72 hours.  Invalid input(s): FREET3 Anemia Panel: No results for input(s): VITAMINB12, FOLATE, FERRITIN, TIBC, IRON, RETICCTPCT in the last 72 hours.  No results found.   Echo LVEF 20 to 25%, moderate aortic stenosis  TELEMETRY: Sinus rhythm:  ASSESSMENT AND PLAN:  Active Problems:   Chest pain    1.  Acute on chronic systolic congestive heart failure, slowly improving with diuresis 2.  Ischemic cardiomyopathy, LVEF 20 to 25%, started on spironolactone and Entresto 3.  Patent stents proximal mid RCA, proximal left circumflex by cardiac cath 05/09/2019 4.  Underlying COPD  Recommendations  1.  Continue current medications 2.  Continue spironolactone and Entresto.  Do not hold medications unless the patient has symptomatic hypotension with systolic blood pressure less than 90.     Isaias Cowman, MD, PhD, [facility] 05/15/2019 8:23 AM

## 2019-05-15 NOTE — Progress Notes (Signed)
Physical Therapy Evaluation Patient Details Name: Kristopher Fernandez MRN: [id number] DOB: 06-04-1932 Today's Date: 05/15/2019   History of Present Illness  Kristopher Fernandez is a 86 yo male who arrived at [facility] with complaints of chest pain, SOB, arm pain, and dizziness. He was admitted for acute on chronic CHF and NSTEMI. PMH includes CAD s/p MI with stenting, HTN, hyperlipidemia, bradycardia, pacemaker placement, and basal cell carcinoma.   Clinical Impression  Pt admitted with above diagnosis. Pt currently with functional limitations due to the deficits listed below (see PT Problem List).  Pt is independent for bed mobility and modified independent for transfers with safe hand placement and good stability demonstrated. He is able to complete a full lap around RN station with rolling walker. SaO2 remains >95% on 2L/min O2 via nasal cannula. Good stability noted and no external support required by therapist. Negative Romberg and single leg balance is limited to approximately 1-2 seconds on each side. Pt reports that he is already receiving [facility] PT services and he should continue this at discharge to continue working on his cardiopulmonary endurance, LE strength, and balance. Pt will benefit from PT services to address deficits in strength, balance, and mobility in order to return to full function at home.     Follow Up Recommendations Home health PT;Other (comment)(Resume HH PT at discharge for balance/conditioning)    Equipment Recommendations  None recommended by PT    Recommendations for Other Services       Precautions / Restrictions Precautions Precautions: Fall Restrictions Weight Bearing Restrictions: No      Mobility  Bed Mobility Overal bed mobility: Independent             General bed mobility comments: Fair speed and good balance in sitting  Transfers Overall transfer level: Modified independent Equipment used: Rolling walker (2 wheeled)             General transfer comment:  Pt demonstrates safe hand placement and good stabilty during multiple transfers with therapist  Ambulation/Gait Ambulation/Gait assistance: Supervision Gait Distance (Feet): 200 Feet Assistive device: Rolling walker (2 wheeled)       General Gait Details: Pt is able to complete a full lap around RN station with rolling walker. SaO2 remains >95% on 2L/min O2 via nasal cannula. Good stability noted and no external support required by therapist  Stairs            Wheelchair Mobility    Modified Rankin (Stroke Patients Only)       Balance Overall balance assessment: Needs assistance Sitting-balance support: No upper extremity supported Sitting balance-Leahy Scale: Good     Standing balance support: No upper extremity supported Standing balance-Leahy Scale: Fair Standing balance comment: Negative Romberg, single leg balance approximately 1-2 seconds                             Pertinent Vitals/Pain Pain Assessment: No/denies pain    Home Living Family/patient expects to be discharged to:: Private residence Living Arrangements: Alone Available Help at Discharge: Family;Friend(s) Type of Home: House Home Access: Stairs to enter Entrance Stairs-Rails: Can reach both Entrance Stairs-Number of Steps: 2 Home Layout: One level Home Equipment: Walker - 2 wheels;Cane - single point;Bedside commode;Shower seat;Grab bars - toilet;Grab bars - tub/shower;Wheelchair - manual      Prior Function Level of Independence: Needs assistance   Gait / Transfers Assistance Needed: Ambulates with spc, drives. No falls reported in the last 12 months  ADL's / Homemaking Assistance Needed: Independent with ADLs, assist from dtr with IADLs        Hand Dominance   Dominant Hand: Right    Extremity/Trunk Assessment   Upper Extremity Assessment Upper Extremity Assessment: Overall WFL for tasks assessed    Lower Extremity Assessment Lower Extremity Assessment: Overall WFL  for tasks assessed       Communication   Communication: No difficulties  Cognition Arousal/Alertness: Awake/alert Behavior During Therapy: WFL for tasks assessed/performed Overall Cognitive Status: Within Functional Limits for tasks assessed                                        General Comments      Exercises     Assessment/Plan    PT Assessment Patient needs continued PT services  PT Problem List Decreased balance;Cardiopulmonary status limiting activity       PT Treatment Interventions DME instruction;Gait training;Functional mobility training;Therapeutic activities;Therapeutic exercise;Balance training;Neuromuscular re-education;Patient/family education    PT Goals (Current goals can be found in the Care Plan section)  Acute Rehab PT Goals Patient Stated Goal: Return home and prior level of function PT Goal Formulation: With patient Time For Goal Achievement: 05/29/19 Potential to Achieve Goals: Good    Frequency Min 2X/week   Barriers to discharge Decreased caregiver support Lives alone but has good family support    Co-evaluation               AM-PAC PT "6 Clicks" Mobility  Outcome Measure Help needed turning from your back to your side while in a flat bed without using bedrails?: None Help needed moving from lying on your back to sitting on the side of a flat bed without using bedrails?: None Help needed moving to and from a bed to a chair (including a wheelchair)?: None Help needed standing up from a chair using your arms (e.g., wheelchair or bedside chair)?: None Help needed to walk in hospital room?: None Help needed climbing 3-5 steps with a railing? : A Little 6 Click Score: 23    End of Session Equipment Utilized During Treatment: Gait belt Activity Tolerance: Patient tolerated treatment well Patient left: in chair;with call bell/phone within reach;with chair alarm set Nurse Communication: Mobility status PT Visit Diagnosis:  Unsteadiness on feet (R26.81)    Time: 4356-8616 PT Time Calculation (min) (ACUTE ONLY): 23 min   Charges:   PT Evaluation $PT Eval Low Complexity: 1 Low PT Treatments $Gait Training: 8-22 mins        Lyndel Safe Kristin Lamagna PT, DPT, GCS   Jenica Costilow 05/15/2019, 9:43 AM

## 2019-05-15 NOTE — Progress Notes (Signed)
Family Meeting Note  Advance Directive:yes  Today a meeting took place with the Patient.  The following clinical team members were present during this meeting:MD  The following were discussed:Patient's diagnosis: 85 y.o. male with history of CAD status post MI and multiple stents, hypertension, hyperlipidemia, bradycardia, pacemaker placement, basal cell carcinoma admitted for chest pain and shortness of breath.  Past Medical History:  Diagnosis Date  . Anginal pain ([place])   . Aortic stenosis 07/03/2014   Overview:  Mild with calculated aortic valve area of 1.05cm2  . Basal cell carcinoma, ear 03/31/2015  . Bradycardia 07/03/2014  . DDD (degenerative disc disease), lumbar 08/06/2015  . H/O cardiac catheterization 07/03/2014   Overview:  Cypher stent proximal and distal RCA 07/18/05 and TAXUS stent mid LAD 07/20/05 at [facility]  . HTN (hypertension) 07/03/2014  . Hyperlipidemia 07/03/2014  . Lumbosacral radiculopathy at S1 08/22/2015  . MI (myocardial infarction) ([place]) 07/03/2014   Overview:  Mi 07/17/05  . Myocardial infarction ([place])   . Normocytic normochromic anemia 09/14/2015  . Pacemaker 07/03/2014   Overview:  Dual chamber pacemaker 08/11/11  . Shortness of breath dyspnea   . SOB (shortness of breath) on exertion 05/13/2016     Patient's progosis: > 12 months and Goals for treatment: DNR  Additional follow-up to be provided: Palliative care c/s -patient is agreeable for DNR.  Consider palliative care as an outpatient at discharge  Time spent during discussion:16 mins  Max Sane, MD

## 2019-05-15 NOTE — Progress Notes (Signed)
Pharmacy Electrolyte Monitoring Consult:  Pharmacy consulted to assist in monitoring and replacing electrolytes in this 85 y.o. male admitted on 05/09/2019 with Chest Pain; Shortness of Breath; Arm Pain; and Dizziness   Labs:  Sodium (mmol/L)  Date Value  05/15/2019 137  03/28/2014 137   Potassium (mmol/L)  Date Value  05/15/2019 4.6  03/28/2014 4.2   Magnesium (mg/dL)  Date Value  05/13/2019 2.5 (H)   Calcium (mg/dL)  Date Value  05/15/2019 8.3 (L)   Calcium, Total (mg/dL)  Date Value  03/28/2014 8.8   Albumin (g/dL)  Date Value  05/09/2019 3.1 (L)  Corrected Calcium: 9.02  Assessment/Plan: Electrolytes are WNL.  Furosemide 40mg  PO Q12hr. Patient continued on spironolactone 25mg  daily and Entresto  No replacement warranted at this time. Watch serum creatinine.   Will obtain electrolytes with am labs.   Pharmacy will continue to monitor and adjust per consult.    Rowland Lathe 05/15/2019

## 2019-05-15 NOTE — Consult Note (Signed)
[facility] Consult   Reason for Consult:  Grief reaction vs. Depression Referring Physician:  Dr. Manuella Ghazi Patient Identification: Kristopher Fernandez MRN:  [id number] Principal Diagnosis: Chest pain Diagnosis:  Principal Problem:   Chest pain Active Problems:   Grief reaction  Patient is seen, chart is reviewed Total Time spent with patient: 1 hour  Subjective:  "I feel great except when I can't breathe."  HPI:   Kristopher Fernandez is a 85 y.o. male patient with a known history of aortic stenosis, basal cell carcinoma of the ear, hypertension, hyperlipidemia, myocardial infarction, coronary disease, cardiac stent presented to the emergency room for chest pain.  Patient chest pain started last night and radiated to the left arm.  Pain is sharp in nature 7 out of 10 on a scale of 1-10.  No complaints of any fever, chills, cough.  No recent travel, sick contacts.  COVID-19 test is negative.  Patient's first set of troponin  0.08, patient was started on heparin drip in the emergency room.  Psychiatry consult is requested for evaluation of depression and anxiety in the context of wife passing away recently. Xanax 0.25 mg has been scheduled twice daily dosing.   On evaluation, patient is alert and oriented x4.  He presents with bright affect.  Patient denies symptoms of depression.  He does report, "I was feeling despondent this morning because I can't breathe when I wake up because I can't breathe at night as the fluid accumulates."  Patient describes a sense of anxiety upon awakening.  He states that once he takes Lasix, his breathing improves.  Patient also describes anxiety about his medications.  He states that they have been providing him with a medication that lowers his blood pressure and heart rate, and makes him feel dizzy.  He is not sure if this was due to Xanax or blood pressure medication.  Patient does not believe he requires a medication for anxiety, and would like that discontinued  if that is contributing to his dizziness.  Patient describes that he is usually, "fine" most of the day, until he begins to have trouble breathing and it feels like a chore to breathe, then he will experience some anxiety.  He does note though if he stops and rests that his breathing is easier.  Discussed with patient his grieving due to the death of his wife on 23-Apr-2019.  He states he was married to her for 98 years, and they were together for several years before that.  He reports that she had been in the hospital, but was able to come home on hospice and died suddenly 1 day while he was changing a light bulb.  He expresses his frustration that he was not able to be in the hospital when she was hospitalized due to the [place] quarantine, and also expresses frustration that he has not been able to arrange a funeral or services for her.  He states, "everybody deserves a closure, and I will give her one as long as I am still alive when all of this quarantine ends."  Patient is not desiring medication for depressed mood.  Patient has no past psychiatric history.  He denies any significant history of depression, anxiety, mania, delusions, or psychosis.  He would like to keep all of his medications to a minimum.  Patient denies any suicidal ideation, plan, or intent.  He states he does not have a gun.  He denies any homicidal ideation.  Patient denies any  alcohol or substance use.  He reports he quit smoking in 1977.   Past Psychiatric History: None  Risk to Self:  no Risk to Others:  no Prior Inpatient Therapy:  none Prior Outpatient Therapy:  None  Past Medical History:  Past Medical History:  Diagnosis Date  . Anginal pain ([place])   . Aortic stenosis 07/03/2014   Overview:  Mild with calculated aortic valve area of 1.05cm2  . Basal cell carcinoma, ear 03/31/2015  . Bradycardia 07/03/2014  . DDD (degenerative disc disease), lumbar 08/06/2015  . H/O cardiac catheterization 07/03/2014   Overview:  Cypher  stent proximal and distal RCA 07/18/05 and TAXUS stent mid LAD 07/20/05 at [facility]  . HTN (hypertension) 07/03/2014  . Hyperlipidemia 07/03/2014  . Lumbosacral radiculopathy at S1 08/22/2015  . MI (myocardial infarction) ([place]) 07/03/2014   Overview:  Mi 07/17/05  . Myocardial infarction ([place])   . Normocytic normochromic anemia 09/14/2015  . Pacemaker 07/03/2014   Overview:  Dual chamber pacemaker 08/11/11  . Shortness of breath dyspnea   . SOB (shortness of breath) on exertion 05/13/2016    Past Surgical History:  Procedure Laterality Date  . CARDIAC CATHETERIZATION Bilateral 07/14/2016   Procedure: Right/Left Heart Cath and Coronary Angiography;  Surgeon: Isaias Cowman, MD;  Location: [facility];  Service: Cardiovascular;  Laterality: Bilateral;  . CORONARY ANGIOPLASTY    . HERNIA REPAIR    . LEFT HEART CATH AND CORONARY ANGIOGRAPHY N/A 05/09/2019   Procedure: LEFT HEART CATH AND CORONARY ANGIOGRAPHY;  Surgeon: Corey Skains, MD;  Location: [facility];  Service: Cardiovascular;  Laterality: N/A;   Family History:  Family History  Problem Relation Age of Onset  . Bladder Cancer Neg Hx   . Kidney cancer Neg Hx   . Prostate cancer Neg Hx    Family Psychiatric  History: None   Social History:  Social History   Substance and Sexual Activity  Alcohol Use No     Social History   Substance and Sexual Activity  Drug Use No    Social History   Socioeconomic History  . Marital status: Widowed    Spouse name: Not on file  . Number of children: 1  . Years of education: Not on file  . Highest education level: Not on file  Occupational History  . Occupation: retired  Scientific laboratory technician  . Financial resource strain: Not very hard  . Food insecurity:    Worry: Never true    Inability: Never true  . Transportation needs:    Medical: No    Non-medical: No  Tobacco Use  . Smoking status: Former Smoker    Years: 10.00    Types: Cigarettes    Last attempt to  quit: 1977    Years since quitting: 43.4  . Smokeless tobacco: Former Network engineer and Sexual Activity  . Alcohol use: No  . Drug use: No  . Sexual activity: Not on file  Lifestyle  . Physical activity:    Days per week: Not on file    Minutes per session: Not on file  . Stress: Only a little  Relationships  . Social connections:    Talks on phone: More than three times a week    Gets together: Not on file    Attends religious service: Not on file    Active member of club or organization: Not on file    Attends meetings of clubs or organizations: Not on file    Relationship  status: Widowed  Other Topics Concern  . Not on file  Social History Narrative  . Not on file   Additional Social History:  Widowed since March 26, 2019   Lives in ranch home since 1960, which has 2 steps to enter the house.  Patient reports that he has no difficulty getting in or around his house. Has a couple people a few times a week for therapy Still independent in ADL's Neighbors and daughter have been bringing him food.   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  Patient has 1 daughter who lives in [place], [place].  He has 2 grandchildren and 2 great-grandchildren.  He was happy to be able to celebrate his great granddaughter's seventh birthday recently.   Allergies:  No Known Allergies  Labs:  Results for orders placed or performed during the hospital encounter of 05/09/19 (from the past 48 hour(s))  Basic metabolic panel     Status: Abnormal   Collection Time: 05/14/19  6:09 AM  Result Value Ref Range   Sodium 136 135 - 145 mmol/L   Potassium 4.3 3.5 - 5.1 mmol/L   Chloride 94 (L) 98 - 111 mmol/L   CO2 32 22 - 32 mmol/L   Glucose, Bld 109 (H) 70 - 99 mg/dL   BUN 33 (H) 8 - 23 mg/dL   Creatinine, Ser 1.42 (H) 0.61 - 1.24 mg/dL   Calcium 8.6 (L) 8.9 - 10.3 mg/dL   GFR calc non Af Amer 45 (L) >60 mL/min   GFR calc Af Amer 52 (L) >60  mL/min   Anion gap 10 5 - 15    Comment: Performed at [facility], [address] [place], [place] [address]  Basic metabolic panel     Status: Abnormal   Collection Time: 05/15/19  4:03 AM  Result Value Ref Range   Sodium 137 135 - 145 mmol/L   Potassium 4.6 3.5 - 5.1 mmol/L   Chloride 97 (L) 98 - 111 mmol/L   CO2 33 (H) 22 - 32 mmol/L   Glucose, Bld 102 (H) 70 - 99 mg/dL   BUN 34 (H) 8 - 23 mg/dL   Creatinine, Ser 1.44 (H) 0.61 - 1.24 mg/dL   Calcium 8.3 (L) 8.9 - 10.3 mg/dL   GFR calc non Af Amer 44 (L) >60 mL/min   GFR calc Af Amer 51 (L) >60 mL/min   Anion gap 7 5 - 15    Comment: Performed at [facility], [address] [place], [place] [address]    Current Facility-Administered Medications  Medication Dose Route Frequency Provider Last Rate Last Dose  . 0.9 %  sodium chloride infusion  250 mL Intravenous PRN Pyreddy, Reatha Harps, MD      . acetaminophen (TYLENOL) tablet 650 mg  650 mg Oral Q4H PRN Saundra Shelling, MD   650 mg at 05/11/19 1451  . albuterol (PROVENTIL) (2.5 MG/3ML) 0.083% nebulizer solution 2.5 mg  2.5 mg Inhalation Q6H PRN Saundra Shelling, MD   2.5 mg at 05/14/19 2256  . ALPRAZolam Duanne Moron) tablet 0.25 mg  0.25 mg Oral BID Dustin Flock, MD   0.25 mg at 05/14/19 2251  . alum & mag hydroxide-simeth (MAALOX/MYLANTA) 200-200-20 MG/5ML suspension 15 mL  15 mL Oral Q6H PRN Saundra Shelling, MD   15 mL at 05/10/19 1908  . aspirin EC tablet 81 mg  81 mg Oral Daily Saundra Shelling, MD   81 mg at 05/14/19 0817  .  atorvastatin (LIPITOR) tablet 80 mg  80 mg Oral Daily Pyreddy, Reatha Harps, MD   80 mg at 05/14/19 1718  . carvedilol (COREG) tablet 3.125 mg  3.125 mg Oral BID WC Corey Skains, MD   3.125 mg at 05/15/19 0836  . clopidogrel (PLAVIX) tablet 75 mg  75 mg Oral Daily Saundra Shelling, MD   75 mg at 05/14/19 0817  . feeding supplement (ENSURE ENLIVE) (ENSURE ENLIVE) liquid 237 mL  237 mL Oral BID BM Pyreddy, Pavan, MD   237 mL at 05/13/19 1423  .  furosemide (LASIX) tablet 40 mg  40 mg Oral BID Lang Snow, NP   40 mg at 05/15/19 0836  . heparin injection 5,000 Units  5,000 Units Subcutaneous Q8H Saundra Shelling, MD   5,000 Units at 05/15/19 4128  . isosorbide mononitrate (IMDUR) 24 hr tablet 30 mg  30 mg Oral Daily Pyreddy, Pavan, MD   30 mg at 05/13/19 1124  . magnesium hydroxide (MILK OF MAGNESIA) suspension 30 mL  30 mL Oral Daily PRN Gardiner Barefoot H, NP   30 mL at 05/14/19 2251  . mometasone-formoterol (DULERA) 200-5 MCG/ACT inhaler 2 puff  2 puff Inhalation BID Saundra Shelling, MD   2 puff at 05/14/19 2252  . nitroGLYCERIN (NITROSTAT) SL tablet 0.4 mg  0.4 mg Sublingual Q5 Min x 3 PRN Pyreddy, Reatha Harps, MD      . ondansetron (ZOFRAN) injection 4 mg  4 mg Intravenous Q6H PRN Saundra Shelling, MD   4 mg at 05/11/19 0747  . sacubitril-valsartan (ENTRESTO) 24-26 mg per tablet  1 tablet Oral BID Isaias Cowman, MD   1 tablet at 05/13/19 2242  . sodium chloride flush (NS) 0.9 % injection 3 mL  3 mL Intravenous Q12H Pyreddy, Reatha Harps, MD   3 mL at 05/14/19 2250  . sodium chloride flush (NS) 0.9 % injection 3 mL  3 mL Intravenous PRN Pyreddy, Pavan, MD      . sodium chloride flush (NS) 0.9 % injection 3 mL  3 mL Intravenous Q12H Corey Skains, MD   3 mL at 05/14/19 2250  . spironolactone (ALDACTONE) tablet 25 mg  25 mg Oral Daily Paraschos, Alexander, MD   25 mg at 05/13/19 1125    Musculoskeletal: Strength & Muscle Tone: within normal limits Gait & Station: normal Patient leans: N/A  Psychiatric Specialty Exam: Physical Exam  Nursing note and vitals reviewed. Constitutional: He is oriented to person, place, and time. He appears well-developed and well-nourished. No distress.  HENT:  Head: Normocephalic and atraumatic.  Eyes: EOM are normal.  Neck: Normal range of motion.  Cardiovascular: Normal rate and regular rhythm.  Respiratory: Effort normal. No respiratory distress.  Musculoskeletal: Normal range of motion.   Neurological: He is alert and oriented to person, place, and time.    Review of Systems  Constitutional: Negative.   HENT: Negative.   Respiratory: Positive for shortness of breath (off and on, uses oxygen PRN).   Cardiovascular: Negative.   Gastrointestinal: Negative.   Genitourinary: Negative.   Musculoskeletal: Negative.   Neurological: Positive for dizziness (with some blood pressure medications).  Psychiatric/Behavioral: Negative for depression and suicidal ideas. The patient is nervous/anxious.   Quit tobacco in 1977   Blood pressure 126/70, pulse 71, temperature 98.5 F (36.9 C), temperature source Oral, resp. rate 20, height 6' (1.829 m), weight 79.8 kg, SpO2 95 %.Body mass index is 23.86 kg/m.  General Appearance: Casual and Neat  Eye Contact:  Good  Speech:  Clear and  Coherent and Normal Rate  Volume:  Normal  Mood:  Euthymic  Affect:  Congruent  Thought Process:  Goal Directed and Descriptions of Associations: Intact  Orientation:  Full (Time, Place, and Person)  Thought Content:  Logical  Suicidal Thoughts:  No  Homicidal Thoughts:  No  Memory:  Good  Judgement:  Good  Insight:  Good  Psychomotor Activity:  Normal  Concentration:  Concentration: Good and Attention Span: Good  Recall:  Good  Fund of Knowledge:  Good  Language:  Good  Akathisia:  No  Handed:  Right  AIMS (if indicated):     Assets:  Communication Skills Desire for Improvement Financial Resources/Insurance Housing Social Support  ADL's:  Intact  Cognition:  WNL  Sleep:   Adequate     Treatment Plan Summary: Daily contact with patient to assess and evaluate symptoms and progress in treatment and Medication management VICTORMANUEL MCLURE is a 85 y.o. male with a history of heart disease, who states he is anxious about the newest heart medication that makes his BP drop and causes him to feel dizzy.  I will discontinue the Klonopin as this may be contributing, and he does not think he needs it.  He  states he wakes up feeling short of breath which causes some anxiety, but once he has lasix, his SOB goes away and he feels fine.  Patient has requested to take his Lasix at 8 AM and 8 PM to see if this would improve his breathing in the morning. Medication adjustments have been made and discussed with hospitalist.  Disposition: No evidence of imminent risk to self or others at present.   Patient does not meet criteria for psychiatric inpatient admission. Supportive therapy provided about ongoing stressors. Psychiatry will continue to follow to make further medication recommendations.  Lavella Hammock, MD 05/15/2019 10:29 AM

## 2019-05-16 ENCOUNTER — Inpatient Hospital Stay: Payer: Medicare Other

## 2019-05-16 DIAGNOSIS — G4730 Sleep apnea, unspecified: Secondary | ICD-10-CM

## 2019-05-16 DIAGNOSIS — R0602 Shortness of breath: Secondary | ICD-10-CM

## 2019-05-16 DIAGNOSIS — Z7189 Other specified counseling: Secondary | ICD-10-CM

## 2019-05-16 DIAGNOSIS — I5023 Acute on chronic systolic (congestive) heart failure: Secondary | ICD-10-CM

## 2019-05-16 DIAGNOSIS — Z515 Encounter for palliative care: Secondary | ICD-10-CM

## 2019-05-16 DIAGNOSIS — F4323 Adjustment disorder with mixed anxiety and depressed mood: Secondary | ICD-10-CM

## 2019-05-16 LAB — BASIC METABOLIC PANEL
Anion gap: 7 (ref 5–15)
BUN: 38 mg/dL — ABNORMAL HIGH (ref 8–23)
CO2: 30 mmol/L (ref 22–32)
Calcium: 8.5 mg/dL — ABNORMAL LOW (ref 8.9–10.3)
Chloride: 97 mmol/L — ABNORMAL LOW (ref 98–111)
Creatinine, Ser: 1.56 mg/dL — ABNORMAL HIGH (ref 0.61–1.24)
GFR calc Af Amer: 47 mL/min — ABNORMAL LOW (ref >60)
GFR calc non Af Amer: 40 mL/min — ABNORMAL LOW (ref >60)
Glucose, Bld: 110 mg/dL — ABNORMAL HIGH (ref 70–99)
Potassium: 4.8 mmol/L (ref 3.5–5.1)
Sodium: 134 mmol/L — ABNORMAL LOW (ref 135–145)

## 2019-05-16 LAB — CBC
HCT: 38 % — ABNORMAL LOW (ref 39.0–52.0)
Hemoglobin: 12.6 g/dL — ABNORMAL LOW (ref 13.0–17.0)
MCH: 28.9 pg (ref 26.0–34.0)
MCHC: 33.2 g/dL (ref 30.0–36.0)
MCV: 87.2 fL (ref 80.0–100.0)
Platelets: 148 10*3/uL — ABNORMAL LOW (ref 150–400)
RBC: 4.36 MIL/uL (ref 4.22–5.81)
RDW: 13.6 % (ref 11.5–15.5)
WBC: 5.7 10*3/uL (ref 4.0–10.5)
nRBC: 0 % (ref 0.0–0.2)

## 2019-05-16 LAB — MAGNESIUM: Magnesium: 2.7 mg/dL — ABNORMAL HIGH (ref 1.7–2.4)

## 2019-05-16 MED ORDER — VENLAFAXINE HCL ER 75 MG PO CP24
75.0000 mg | ORAL_CAPSULE | Freq: Every day | ORAL | Status: DC
  Administered 2019-05-16 – 2019-05-17 (×2): 75 mg via ORAL
  Filled 2019-05-16: qty 1

## 2019-05-16 MED ORDER — MIDODRINE HCL 5 MG PO TABS
5.0000 mg | ORAL_TABLET | Freq: Three times a day (TID) | ORAL | Status: DC
  Administered 2019-05-16 – 2019-05-17 (×2): 5 mg via ORAL
  Filled 2019-05-16 (×2): qty 1

## 2019-05-16 NOTE — TOC Initial Note (Addendum)
Transition of Care [facility]) - Initial/Assessment Note    Patient Details  Name: Kristopher Fernandez MRN: [id number] Date of Birth: 11/29/1932  Transition of Care [facility]) CM/SW Contact:    Elza Rafter, RN Phone Number: 05/16/2019, 9:09 AM  Clinical Narrative:      Patient is from home with daughter.  Admitted with CP, sob.  Entresto coupon previously provided by SW earlier this admission.  Current with PCP.  [facility]ains [facility] without difficulty.   Uses a walker at all times.  His daughter drives him to his appointments.  He states he currently has a nurse that comes to the house but cannot remember the agency.            Spoke with [facility] and he has used them in the past.  Referral made for home health RN, PT, OT and aide.  He uses O2 at 2L chronically-obtains through [facility].  Will continue to follow and assist with discharge needs.   Continues to be SOB and hypotensive.   Expected Discharge Plan: [place] Barriers to Discharge: Continued Medical Work up   Patient Goals and CMS Choice Patient states their goals for this hospitalization and ongoing recovery are:: Continue with home health-unsure of who he has CMS Medicare.gov Compare Post Acute Care list provided to:: Patient Choice offered to / list presented to : Patient  Expected Discharge Plan and Services Expected Discharge Plan: [place]   Discharge Planning Services: CM Consult Post Acute Care Choice: [place] arrangements for the past 2 months: Single Family Home Expected Discharge Date: 05/10/19                         HH Arranged: PT, RN, OT, Nurse's Aide          Prior Living Arrangements/Services Living arrangements for the past 2 months: Single Family Home Lives with:: Adult Children Patient language and need for interpreter reviewed:: Yes Do you feel safe going back to the place where you live?: Yes             Criminal Activity/Legal Involvement Pertinent to Current Situation/Hospitalization: No - Comment as needed  Activities of Daily Living Home Assistive Devices/Equipment: Cane (specify quad or straight), Walker (specify type), Blood pressure cuff, Scales ADL Screening (condition at time of admission) Patient's cognitive ability adequate to safely complete daily activities?: Yes Is the patient deaf or have difficulty hearing?: No Does the patient have difficulty seeing, even when wearing glasses/contacts?: No Does the patient have difficulty concentrating, remembering, or making decisions?: No Patient able to express need for assistance with ADLs?: Yes Does the patient have difficulty dressing or bathing?: No Independently performs ADLs?: Yes (appropriate for developmental age) Does the patient have difficulty walking or climbing stairs?: Yes Weakness of Legs: Both Weakness of Arms/Hands: None  Permission Sought/Granted Permission sought to share information with : Facility Art therapist granted to share information with : Yes, Verbal Permission Granted  Share Information with NAME: Home Health agencies           Emotional Assessment Appearance:: Appears stated age Attitude/Demeanor/Rapport: Gracious Affect (typically observed): Accepting Orientation: : Oriented to Self, Oriented to Place, Oriented to  Time, Oriented to Situation Alcohol / Substance Use: Not Applicable    Admission diagnosis:  NSTEMI (non-ST elevated myocardial infarction) [facility]) [I21.4] Patient Active Problem List   Diagnosis Date Noted  . Grief  reaction 05/15/2019  . Chest pain 12/26/2018  . Ascending aortic aneurysm ([place]) 02/09/2018  . CKD (chronic kidney disease) stage 3, GFR 30-59 ml/min (H[facility]) 02/03/2017  . Status post cardiac catheterization 07/14/2016  . SOB (shortness of breath) on exertion 05/13/2016  . 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  Normocytic normochromic anemia 09/14/2015  . Lumbosacral radiculopathy at S1 08/22/2015  . DDD (degenerative disc disease), lumbar 08/06/2015  . Basal cell carcinoma, ear 03/31/2015  . History of malignant neoplasm of skin 11/18/2014  . Aortic stenosis 07/03/2014  . Aortic valve stenosis 07/03/2014  . Bradycardia 07/03/2014  . H/O cardiac catheterization 07/03/2014  . HTN (hypertension) 07/03/2014  . Hyperlipidemia 07/03/2014  . Hypertension 07/03/2014  . MI (myocardial infarction) ([place]) 07/03/2014  . Pacemaker 07/03/2014  . Shoulder pain 11/20/2013  . Pain in joint involving pelvic region and thigh 11/27/2012  . Trochanteric bursitis 11/27/2012   PCP:  Medicine, [place]:   [place], [place] - [address] [place] [address] Phone: [phone] Fax: [phone]     Social Determinants of Health ([place]) Interventions    Readmission Risk Interventions Readmission Risk Prevention Plan 05/16/2019  Transportation Screening Complete  PCP or Specialist Appt within 5-7 Days Complete  Home Care Screening Complete  Medication Review (RN CM) Complete  Some recent data might be hidden

## 2019-05-16 NOTE — Progress Notes (Signed)
Nutrition Follow Up Note    DOCUMENTATION CODES:   Not applicable  INTERVENTION:   Ensure Enlive po BID, each supplement provides 350 kcal and 20 grams of protein  NUTRITION DIAGNOSIS:   Increased nutrient needs related to chronic illness(CHF) as evidenced by increased estimated needs.  GOAL:   Patient will meet greater than or equal to 90% of their needs  -progressing  MONITOR:   PO intake, Supplement acceptance, Labs, Weight trends, Skin, I & O's  ASSESSMENT:   85 year old male patient with history of basal cell carcinoma of the ear, hypertension, hyperlipidemia, myocardial infarction, coronary disease, cardiac stent under hospitalist service for chest pain  RD working remotely.  Pt continues to do well; pt eating 100% of meals and drinking Ensure. Per chart, pt down ~12lbs since admit. Recommend continue supplements after discharge.   Pt at risk for developing malnutrition but unable to diagnose at this time at [facility] cannot be performed.   Medications reviewed and include: aspirin, plavix, lasix, heparin, KCl, aldactone   Labs reviewed: Na 134(L), Cl 97(L), BUN 38(H), creat 1.56(H)  Diet Order:   Diet Order            Diet Heart Room service appropriate? Yes; Fluid consistency: Thin  Diet effective now             EDUCATION NEEDS:   Education needs have been addressed  Skin:  Skin Assessment: Reviewed RN Assessment  Last BM:  5/20  Height:   Ht Readings from Last 1 Encounters:  05/09/19 6' (1.829 m)    Weight:   Wt Readings from Last 1 Encounters:  05/16/19 80.2 kg    Ideal Body Weight:  80.9 kg  BMI:  Body mass index is 23.98 kg/m.  Estimated Nutritional Needs:   Kcal:  2000-2300kcal/day   Protein:  104-120g/day   Fluid:  1.9L/day   Koleen Distance MS, RD, LDN Pager #- [phone] Office#- [phone] After Hours Pager: [phone]

## 2019-05-16 NOTE — Progress Notes (Signed)
Pharmacy Electrolyte Monitoring Consult:  Pharmacy consulted to assist in monitoring and replacing electrolytes in this 85 y.o. male admitted on 05/09/2019 with Chest Pain; Shortness of Breath; Arm Pain; and Dizziness   Labs:  Sodium (mmol/L)  Date Value  05/16/2019 134 (L)  03/28/2014 137   Potassium (mmol/L)  Date Value  05/16/2019 4.8  03/28/2014 4.2   Magnesium (mg/dL)  Date Value  05/16/2019 2.7 (H)   Calcium (mg/dL)  Date Value  05/16/2019 8.5 (L)   Calcium, Total (mg/dL)  Date Value  03/28/2014 8.8   Albumin (g/dL)  Date Value  05/09/2019 3.1 (L)  Corrected Calcium: 9.02  Assessment/Plan: Potassium wnl's but rising slowly - Mag slightly elevated  Furosemide 40mg  PO Q12hr. Patient continued on spironolactone 25mg  daily and Entresto  No replacement warranted at this time. Watch serum creatinine as it continues to rise and consider adjusting furosemide.  Will obtain electrolytes with am labs.   Pharmacy will continue to monitor and adjust per consult.    Lu Duffel, PharmD, BCPS Clinical Pharmacist 05/16/2019 7:49 AM

## 2019-05-16 NOTE — Progress Notes (Signed)
As patient continues to be symptomatic with low blood pressure.  We are holding Imdur and other blood pressure medicine.  I will add Midodrin to see if that helps

## 2019-05-16 NOTE — Consult Note (Signed)
Consultation Note Date: 05/16/19  Patient Name: Kristopher Fernandez  DOB: April 11, 1932  MRN: [id number]  Age / Sex: 85 y.o., male  PCP: Medicine, Kristopher Fernandez Family Referring Physician: Epifanio Lesches, MD  Reason for Consultation: Establishing goals of care  HPI/Patient Profile: 85 y.o. male  with past medical history of CAD, MI s/p multiple stents, chronic systolic heart failure with EF 20-25%, HTN, HLD, pacemaker, basal cell carcinoma admitted on 05/09/2019 with chest pain and shortness of breath. Patient found to have fluid overload. IV lasix initiated. Cardiology consulted and following. NSTEMI likely demand ischemia from severe LV systolic dysfunction. S/p recent cardiac cath with no blockage noted. Anxiety/depression from the death of wife in Apr 20, 2023. Psych consulted. Palliative medicine consultation for goals of care.   Clinical Assessment and Goals of Care:  I have reviewed medical records, discussed with attending Kristopher Mola, NP), and met with patient at bedside to discuss diagnosis, [place], EOL wishes, disposition and options.  Introduced Palliative Medicine as specialized medical care for people living with serious illness. It focuses on providing relief from the symptoms and stress of a serious illness. The goal is to improve quality of life for both the patient and the family.  We discussed a brief life review of the patient. Lives at home with daughter. Prior to admission, patient independent and able to complete ADL's. 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 Despite physical limitations from heart failure, Kristopher Fernandez tries to stay as busy and active as he can.   Patient experienced his first MI in 2006. Kristopher Fernandez speaks highly of the care he receives from Dr. Saralyn Fernandez who he has known for ~15 years.   Therapeutic listening and  emotional support provided as patient describes the recent loss of his wife of 22 years this past Apr 20, 2023. He does have good support from his daughter and neighbors, who check on him frequently. He is not interested in taking psych medications and wants to minimize his medication needs.  Discussed course of hospitalization including diagnoses and interventions. Discussed chronic, progressive nature of heart failure.   Patient is most worried about his medications causing low blood pressures and dizziness. We reviewed his medications in detail. Explained that attending and/or cardiology will make adjustments to medications if low blood pressure continues. Reviewed ECHO.  I attempted to elicit values and goals of care important to the patient. Advanced directives, concepts specific to code status, artifical feeding and hydration, and rehospitalization were considered and discussed. Patient has a documented living will (not on file). He shares conversations with his late wife and daughter regarding wishes against heroic interventions at EOL. "What kind of life is that?" He speaks of making this decision for his wife in Apr 20, 2023 after she suffered a severe stroke that would have left her debilitated in a nursing home the rest of her life.   Introduced and discussed MOST form. Patient reports he has a completed MOST form and durable DNR at home.  Palliative Care services outpatient were explained and offered. Patient agreeable with outpatient palliative referral.   Questions and concerns were addressed. PMT contact information given. Patient appreciative of conversation and states this is the best he has felt this admission.     SUMMARY OF RECOMMENDATIONS    Good initial palliative discussion with patient.   Patient confirms wishes against heroic measures at EOL. He has a documented living will, MOST form, and durable DNR at home. (Not on file).   Patient is most worried about his heart medications  contributing to low blood pressures. Defer to attending and cardiology.   Patient agreeable with outpatient palliative services. RN CM notified. Main goal is to remain home and as independent as possible. Lives with daughter and has supportive neighbors/friends.   Code Status/Advance Care Planning:  DNR  Symptom Management:   Per attending and psych  Palliative Prophylaxis:   Delirium Protocol and Frequent Pain Assessment  Psycho-social/Spiritual:   Desire for further Chaplaincy support:yes  Additional Recommendations: Caregiving  Support/Resources  Prognosis:   Unable to determine  Discharge Planning: Home with Home Health. Outpatient palliative referral.       Primary Diagnoses: Present on Admission: . Chest pain   I have reviewed the medical record, interviewed the patient and family, and examined the patient. The following aspects are pertinent.  Past Medical History:  Diagnosis Date  . Anginal pain ([place])   . Aortic stenosis 07/03/2014   Overview:  Mild with calculated aortic valve area of 1.05cm2  . Basal cell carcinoma, ear 03/31/2015  . Bradycardia 07/03/2014  . DDD (degenerative disc disease), lumbar 08/06/2015  . H/O cardiac catheterization 07/03/2014   Overview:  Cypher stent proximal and distal RCA 07/18/05 and TAXUS stent mid LAD 07/20/05 at [facility]  . HTN (hypertension) 07/03/2014  . Hyperlipidemia 07/03/2014  . Lumbosacral radiculopathy at S1 08/22/2015  . MI (myocardial infarction) ([place]) 07/03/2014   Overview:  Mi 07/17/05  . Myocardial infarction ([place])   . Normocytic normochromic anemia 09/14/2015  . Pacemaker 07/03/2014   Overview:  Dual chamber pacemaker 08/11/11  . Shortness of breath dyspnea   . SOB (shortness of breath) on exertion 05/13/2016   Social History   Socioeconomic History  . Marital status: Widowed    Spouse name: Not on file  . Number of children: 1  . Years of education: Not on file  . Highest education level: Not on file   Occupational History  . Occupation: retired  Scientific laboratory technician  . Financial resource strain: Not very hard  . Food insecurity:    Worry: Never true    Inability: Never true  . Transportation needs:    Medical: No    Non-medical: No  Tobacco Use  . Smoking status: Former Smoker    Years: 10.00    Types: Cigarettes    Last attempt to quit: 1977    Years since quitting: 43.4  . Smokeless tobacco: Former Network engineer and Sexual Activity  . Alcohol use: No  . Drug use: No  . Sexual activity: Not on file  Lifestyle  . Physical activity:    Days per week: Not on file    Minutes per session: Not on file  . Stress: Only a little  Relationships  . Social connections:    Talks on phone: More than three times a week    Gets together: Not on file    Attends religious service: Not on file    Active member of club or organization: Not  on file    Attends meetings of clubs or organizations: Not on file    Relationship status: Widowed  Other Topics Concern  . Not on file  Social History Narrative  . Not on file   Family History  Problem Relation Age of Onset  . Bladder Cancer Neg Hx   . Kidney cancer Neg Hx   . Prostate cancer Neg Hx    Scheduled Meds: . aspirin EC  81 mg Oral Daily  . atorvastatin  80 mg Oral Daily  . carvedilol  3.125 mg Oral BID WC  . clopidogrel  75 mg Oral Daily  . feeding supplement (ENSURE ENLIVE)  237 mL Oral BID BM  . furosemide  40 mg Oral BID  . heparin injection (subcutaneous)  5,000 Units Subcutaneous Q8H  . midodrine  5 mg Oral TID WC  . mometasone-formoterol  2 puff Inhalation BID  . sacubitril-valsartan  1 tablet Oral BID  . sodium chloride flush  3 mL Intravenous Q12H  . sodium chloride flush  3 mL Intravenous Q12H  . spironolactone  25 mg Oral Daily  . venlafaxine XR  75 mg Oral Q breakfast   Continuous Infusions: . sodium chloride     PRN Meds:.sodium chloride, acetaminophen, albuterol, alum & mag hydroxide-simeth, magnesium hydroxide,  nitroGLYCERIN, ondansetron (ZOFRAN) IV, sodium chloride flush Medications Prior to Admission:  Prior to Admission medications   Medication Sig Start Date End Date Taking? Authorizing Provider  albuterol (PROAIR HFA) 108 (90 Base) MCG/ACT inhaler Inhale 2 puffs into the lungs every 6 (six) hours as needed for wheezing.    Yes [provider]  aspirin EC 81 MG tablet Take 81 mg by mouth daily.   Yes [provider]  atorvastatin (LIPITOR) 80 MG tablet Take 80 mg by mouth daily.   Yes [provider]  budesonide (PULMICORT) 0.5 MG/2ML nebulizer solution Take 0.5 mg by nebulization daily.   Yes [provider]  clopidogrel (PLAVIX) 75 MG tablet Take 75 mg by mouth daily.   Yes [provider]  ipratropium-albuterol (DUONEB) 0.5-2.5 (3) MG/3ML SOLN Inhale 3 mLs into the lungs every 4 (four) hours as needed (for shortness of breath/wheezing).  02/06/18 05/10/19 Yes [provider]  isosorbide mononitrate (IMDUR) 60 MG 24 hr tablet Take 0.5 tablets (30 mg total) by mouth daily. 12/29/18  Yes Salary, Holly Bodily D, MD  metoprolol succinate (TOPROL-XL) 25 MG 24 hr tablet Take 25 mg by mouth daily. 04/30/19  Yes [provider]  montelukast (SINGULAIR) 10 MG tablet Take 10 mg by mouth at bedtime as needed (for allergies).    Yes [provider]  nitroGLYCERIN (NITROSTAT) 0.6 MG SL tablet Place 1 tablet (0.6 mg total) under the tongue every 5 (five) minutes as needed for chest pain. 12/28/18  Yes Salary, Montell D, MD  ramipril (ALTACE) 5 MG capsule Take 5 mg by mouth daily.   Yes [provider]  ranolazine (RANEXA) 500 MG 12 hr tablet Take 500 mg by mouth 2 (two) times daily.   Yes [provider]  zaleplon (SONATA) 5 MG capsule Take 5 mg by mouth at bedtime as needed for sleep.   Yes [provider]   No Known Allergies Review of Systems  Respiratory: Positive for shortness of breath.    Physical Exam Vitals signs  and nursing note reviewed.  Constitutional:      General: He is awake.  HENT:     Head: Normocephalic and atraumatic.  Cardiovascular:  Rate and Rhythm: Normal rate.  Pulmonary:     Effort: No tachypnea, accessory muscle usage or respiratory distress.  Skin:    General: Skin is warm and dry.  Neurological:     Mental Status: He is alert and oriented to person, place, and time.  Psychiatric:        Mood and Affect: Mood normal.        Speech: Speech normal.        Behavior: Behavior normal.        Cognition and Memory: Cognition normal.    Vital Signs: BP 123/81 (BP Location: Left Arm)   Pulse 61   Temp 97.8 F (36.6 C) (Oral)   Resp 19   Ht 6' (1.829 m)   Wt 79.6 kg   SpO2 99%   BMI 23.80 kg/m  Pain Scale: 0-10 POSS *See Group Information*: 1-Acceptable,Awake and alert Pain Score: 0-No pain   SpO2: SpO2: 99 % O2 Device:SpO2: 99 % O2 Flow Rate: .O2 Flow Rate (L/min): 2 L/min  IO: Intake/output summary:   Intake/Output Summary (Last 24 hours) at 05/17/2019 0735 Last data filed at 05/17/2019 8127 Gross per 24 hour  Intake 3 ml  Output 1225 ml  Net -1222 ml    LBM: Last BM Date: 05/16/19 Baseline Weight: Weight: 85.3 kg Most recent weight: Weight: 79.6 kg     Palliative Assessment/Data: PPS 60%   Flowsheet Rows     Most Recent Value  Intake Tab  Referral Department  Hospitalist  Unit at Time of Referral  Med/Surg Unit  Palliative Care Primary Diagnosis  Cardiac  Palliative Care Type  New Palliative care  Reason for referral  Clarify Goals of Care  Date first seen by Palliative Care  05/16/19  Clinical Assessment  Palliative Performance Scale Score  60%  Psychosocial & Spiritual Assessment  Palliative Care Outcomes  Patient/Family meeting held?  Yes  Who was at the meeting?  patient  Palliative Care Outcomes  Clarified goals of care, Provided end of life care assistance, Provided psychosocial or spiritual support, Linked to palliative care  logitudinal support, ACP counseling assistance      Time In: 1100 Time Out: 1240 Time Total: 100 Greater than 50%  of this time was spent counseling and coordinating care related to the above assessment and plan.  Signed by:  Ihor Dow, DNP, FNP-C Palliative Medicine Team  Phone: [phone] Fax: [phone]   Please contact Palliative Medicine Team phone at [phone] for questions and concerns.  For individual provider: See Shea Evans

## 2019-05-16 NOTE — Progress Notes (Signed)
OT Cancellation Note  Patient Details Name: Kristopher Fernandez MRN: [id number] DOB: 06-03-1932   Cancelled Treatment:    Reason Eval/Treat Not Completed: Other (comment). Thank you for the OT consult. Order received and chart reviewed. Upon arrival to pt room, pt stated he was "feeling horrible" and expressed concerns over how his health has "declined". OT provided active listening for emotional support and offered to re-attempt OT evaluation at a later time when pt was feeling better. Pt agreed to eval later this am. Will follow acutely and re-attempt as pt available and medically appropriate for OT eval.   Shara Blazing, M.S., OTR/L Ascom: [phone] 05/16/19, 9:00 AM

## 2019-05-16 NOTE — Plan of Care (Signed)
PO lasix held per [facility] due to low BP, was instructed to still give entresto. Pt's SBP still in the 90's.  Problem: Activity: Goal: Risk for activity intolerance will decrease Outcome: Progressing Note:  Stood at bedside, steady on feet but pt still complains of being very dizzy and his head just spinning   Problem: Nutrition: Goal: Adequate nutrition will be maintained Outcome: Progressing   Problem: Coping: Goal: Level of anxiety will decrease Outcome: Progressing   Problem: Elimination: Goal: Will not experience complications related to urinary retention Outcome: Progressing   Problem: Pain Managment: Goal: General experience of comfort will improve Outcome: Progressing Note:  No complaints of pain this shift   Problem: Safety: Goal: Ability to remain free from injury will improve Outcome: Progressing Note:  Low bed and fall mats in place as well as bed alarm   Problem: Skin Integrity: Goal: Risk for impaired skin integrity will decrease Outcome: Progressing   Problem: Education: Goal: Knowledge of General Education information will improve Description Including pain rating scale, medication(s)/side effects and non-pharmacologic comfort measures Outcome: Completed/Met

## 2019-05-16 NOTE — Consult Note (Signed)
[facility] Consult   Reason for Consult:  Grief reaction vs. Depression Referring Physician:  Dr. Manuella Ghazi Patient Identification: Kristopher Fernandez MRN:  [id number] Principal Diagnosis: Chest pain Diagnosis:  Principal Problem:   Chest pain Active Problems:   Grief reaction  Patient is seen, chart is reviewed Total Time spent with patient: 45 minutes  Subjective:  "I am not feeling very well today.  My blood pressure is really low, and I feel that blah."  HPI:   Kristopher Fernandez is a 85 y.o. male patient with a known history of aortic stenosis, basal cell carcinoma of the ear, hypertension, hyperlipidemia, myocardial infarction, coronary disease, cardiac stent presented to the emergency room for chest pain.  Patient chest pain started last night and radiated to the left arm.  Pain is sharp in nature 7 out of 10 on a scale of 1-10.  No complaints of any fever, chills, cough.  No recent travel, sick contacts.  COVID-19 test is negative.  Patient's first set of troponin  0.08, patient was started on heparin drip in the emergency room.  Psychiatry consult is requested for evaluation of depression and anxiety in the context of wife passing away recently. Xanax 0.25 mg has been scheduled twice daily dosing.   On initial psychiatric evaluation, patient is alert and oriented x4.  He presents with bright affect.  Patient denies symptoms of depression.  He does report, "I was feeling despondent this morning because I can't breathe when I wake up because I can't breathe at night as the fluid accumulates."  Patient describes a sense of anxiety upon awakening.  He states that once he takes Lasix, his breathing improves.  Patient also describes anxiety about his medications.  He states that they have been providing him with a medication that lowers his blood pressure and heart rate, and makes him feel dizzy.  He is not sure if this was due to Xanax or blood pressure medication.  Patient does not believe  he requires a medication for anxiety, and would like that discontinued if that is contributing to his dizziness.  Patient describes that he is usually, "fine" most of the day, until he begins to have trouble breathing and it feels like a chore to breathe, then he will experience some anxiety.  He does note though if he stops and rests that his breathing is easier.  Discussed with patient his grieving due to the death of his wife on 04-12-19.  He states he was married to her for 75 years, and they were together for several years before that.  He reports that she had been in the hospital, but was able to come home on hospice and died suddenly 1 day while he was changing a light bulb.  He expresses his frustration that he was not able to be in the hospital when she was hospitalized due to the [place] quarantine, and also expresses frustration that he has not been able to arrange a funeral or services for her.  He states, "everybody deserves a closure, and I will give her one as long as I am still alive when all of this quarantine ends."  Patient is not desiring medication for depressed mood.  Patient has no past psychiatric history.  He denies any significant history of depression, anxiety, mania, delusions, or psychosis.  He would like to keep all of his medications to a minimum.  Patient denies any suicidal ideation, plan, or intent.  He states he does not  have a gun.  He denies any homicidal ideation.  Patient denies any alcohol or substance use.  He reports he quit smoking in 1977.  On reevaluation 05/16/2019, patient reports that he did not sleep well, although he does state that he was not as short of breath in the morning with adjustment in his Lasix.  He continues to feel frustrated that his blood pressure is low, and that is keeping him from being discharged.  He reports low energy and dizziness when his blood pressure is low which frightens him that he might fall.  Patient is agreeable to starting a  medication for depression, venlafaxine, which can have a side effect of increasing blood pressure.  Patient is hopeful that it will also improve his mood.  Patient does endorse a history of pauses in his breathing, and waking up gasping for air.  He suspects that he has sleep apnea, and is willing to try an AutoPap overnight to see if that can improve his sleep and breathing status.  I suspect that this can also improve thoracic pressures overnight which could improve his overnight cardiac output and decrease his overall edema.  Patient is agreeable to trial.  He is denying any SI, HI, AVH.  Past Psychiatric History: None  Risk to Self:  no Risk to Others:  no Prior Inpatient Therapy:  none Prior Outpatient Therapy:  None  Past Medical History:  Past Medical History:  Diagnosis Date  . Anginal pain ([place])   . Aortic stenosis 07/03/2014   Overview:  Mild with calculated aortic valve area of 1.05cm2  . Basal cell carcinoma, ear 03/31/2015  . Bradycardia 07/03/2014  . DDD (degenerative disc disease), lumbar 08/06/2015  . H/O cardiac catheterization 07/03/2014   Overview:  Cypher stent proximal and distal RCA 07/18/05 and TAXUS stent mid LAD 07/20/05 at [facility]  . HTN (hypertension) 07/03/2014  . Hyperlipidemia 07/03/2014  . Lumbosacral radiculopathy at S1 08/22/2015  . MI (myocardial infarction) ([place]) 07/03/2014   Overview:  Mi 07/17/05  . Myocardial infarction ([place])   . Normocytic normochromic anemia 09/14/2015  . Pacemaker 07/03/2014   Overview:  Dual chamber pacemaker 08/11/11  . Shortness of breath dyspnea   . SOB (shortness of breath) on exertion 05/13/2016    Past Surgical History:  Procedure Laterality Date  . CARDIAC CATHETERIZATION Bilateral 07/14/2016   Procedure: Right/Left Heart Cath and Coronary Angiography;  Surgeon: Isaias Cowman, MD;  Location: [facility];  Service: Cardiovascular;  Laterality: Bilateral;  . CORONARY ANGIOPLASTY    . HERNIA REPAIR    . LEFT HEART CATH  AND CORONARY ANGIOGRAPHY N/A 05/09/2019   Procedure: LEFT HEART CATH AND CORONARY ANGIOGRAPHY;  Surgeon: Corey Skains, MD;  Location: [facility];  Service: Cardiovascular;  Laterality: N/A;   Family History:  Family History  Problem Relation Age of Onset  . Bladder Cancer Neg Hx   . Kidney cancer Neg Hx   . Prostate cancer Neg Hx    Family Psychiatric  History: None   Social History:  Social History   Substance and Sexual Activity  Alcohol Use No     Social History   Substance and Sexual Activity  Drug Use No    Social History   Socioeconomic History  . Marital status: Widowed    Spouse name: Not on file  . Number of children: 1  . Years of education: Not on file  . Highest education level: Not on file  Occupational History  . Occupation:  retired  Scientific laboratory technician  . Financial resource strain: Not very hard  . Food insecurity:    Worry: Never true    Inability: Never true  . Transportation needs:    Medical: No    Non-medical: No  Tobacco Use  . Smoking status: Former Smoker    Years: 10.00    Types: Cigarettes    Last attempt to quit: 1977    Years since quitting: 43.4  . Smokeless tobacco: Former Network engineer and Sexual Activity  . Alcohol use: No  . Drug use: No  . Sexual activity: Not on file  Lifestyle  . Physical activity:    Days per week: Not on file    Minutes per session: Not on file  . Stress: Only a little  Relationships  . Social connections:    Talks on phone: More than three times a week    Gets together: Not on file    Attends religious service: Not on file    Active member of club or organization: Not on file    Attends meetings of clubs or organizations: Not on file    Relationship status: Widowed  Other Topics Concern  . Not on file  Social History Narrative  . Not on file   Additional Social History:  Widowed since March 26, 2019   Lives in ranch home since 1960, which has 2 steps to enter the house.  Patient  reports that he has no difficulty getting in or around his house. Has a couple people a few times a week for therapy Still independent in ADL's Neighbors and daughter have been bringing him food.   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  Patient has 1 daughter who lives in [place], [place].  He has 2 grandchildren and 2 great-grandchildren.  He was happy to be able to celebrate his great granddaughter's seventh birthday recently.   Allergies:  No Known Allergies  Labs:  Results for orders placed or performed during the hospital encounter of 05/09/19 (from the past 48 hour(s))  Basic metabolic panel     Status: Abnormal   Collection Time: 05/15/19  4:03 AM  Result Value Ref Range   Sodium 137 135 - 145 mmol/L   Potassium 4.6 3.5 - 5.1 mmol/L   Chloride 97 (L) 98 - 111 mmol/L   CO2 33 (H) 22 - 32 mmol/L   Glucose, Bld 102 (H) 70 - 99 mg/dL   BUN 34 (H) 8 - 23 mg/dL   Creatinine, Ser 1.44 (H) 0.61 - 1.24 mg/dL   Calcium 8.3 (L) 8.9 - 10.3 mg/dL   GFR calc non Af Amer 44 (L) >60 mL/min   GFR calc Af Amer 51 (L) >60 mL/min   Anion gap 7 5 - 15    Comment: Performed at [facility], [address] [place], [place] [address]  Basic metabolic panel     Status: Abnormal   Collection Time: 05/16/19  5:07 AM  Result Value Ref Range   Sodium 134 (L) 135 - 145 mmol/L   Potassium 4.8 3.5 - 5.1 mmol/L   Chloride 97 (L) 98 - 111 mmol/L   CO2 30 22 - 32 mmol/L   Glucose, Bld 110 (H) 70 - 99 mg/dL   BUN 38 (H) 8 - 23 mg/dL   Creatinine, Ser 1.56 (H) 0.61 - 1.24 mg/dL   Calcium 8.5 (L) 8.9 - 10.3 mg/dL   GFR calc non Af Wyvonnia Lora  40 (L) >60 mL/min   GFR calc Af Amer 47 (L) >60 mL/min   Anion gap 7 5 - 15    Comment: Performed at [facility], [address] [place], [place] [address]  Magnesium     Status: Abnormal   Collection Time: 05/16/19  5:07 AM  Result Value Ref Range   Magnesium 2.7 (H) 1.7 - 2.4 mg/dL     Comment: Performed at [facility], [address] [place], [place] [address]  CBC     Status: Abnormal   Collection Time: 05/16/19  8:11 AM  Result Value Ref Range   WBC 5.7 4.0 - 10.5 K/uL   RBC 4.36 4.22 - 5.81 MIL/uL   Hemoglobin 12.6 (L) 13.0 - 17.0 g/dL   HCT 38.0 (L) 39.0 - 52.0 %   MCV 87.2 80.0 - 100.0 fL   MCH 28.9 26.0 - 34.0 pg   MCHC 33.2 30.0 - 36.0 g/dL   RDW 13.6 11.5 - 15.5 %   Platelets 148 (L) 150 - 400 K/uL   nRBC 0.0 0.0 - 0.2 %    Comment: Performed at [facility], [address] [place], [place] [address]    Current Facility-Administered Medications  Medication Dose Route Frequency Provider Last Rate Last Dose  . 0.9 %  sodium chloride infusion  250 mL Intravenous PRN Pyreddy, Reatha Harps, MD      . acetaminophen (TYLENOL) tablet 650 mg  650 mg Oral Q4H PRN Saundra Shelling, MD   650 mg at 05/11/19 1451  . albuterol (PROVENTIL) (2.5 MG/3ML) 0.083% nebulizer solution 2.5 mg  2.5 mg Inhalation Q6H PRN Saundra Shelling, MD   2.5 mg at 05/14/19 2256  . alum & mag hydroxide-simeth (MAALOX/MYLANTA) 200-200-20 MG/5ML suspension 15 mL  15 mL Oral Q6H PRN Saundra Shelling, MD   15 mL at 05/10/19 1908  . aspirin EC tablet 81 mg  81 mg Oral Daily Saundra Shelling, MD   81 mg at 05/16/19 0925  . atorvastatin (LIPITOR) tablet 80 mg  80 mg Oral Daily Pyreddy, Reatha Harps, MD   80 mg at 05/16/19 1651  . carvedilol (COREG) tablet 3.125 mg  3.125 mg Oral BID WC Corey Skains, MD   Stopped at 05/16/19 1135  . clopidogrel (PLAVIX) tablet 75 mg  75 mg Oral Daily Saundra Shelling, MD   75 mg at 05/16/19 0925  . feeding supplement (ENSURE ENLIVE) (ENSURE ENLIVE) liquid 237 mL  237 mL Oral BID BM Pyreddy, Pavan, MD   237 mL at 05/16/19 1433  . furosemide (LASIX) tablet 40 mg  40 mg Oral BID Lavella Hammock, MD   Stopped at 05/16/19 1000  . heparin injection 5,000 Units  5,000 Units Subcutaneous Q8H Saundra Shelling, MD   5,000 Units at 05/16/19 2116  . magnesium hydroxide (MILK OF  MAGNESIA) suspension 30 mL  30 mL Oral Daily PRN Gardiner Barefoot H, NP   30 mL at 05/14/19 2251  . midodrine (PROAMATINE) tablet 5 mg  5 mg Oral TID WC Max Sane, MD   5 mg at 05/16/19 1702  . mometasone-formoterol (DULERA) 200-5 MCG/ACT inhaler 2 puff  2 puff Inhalation BID Saundra Shelling, MD   2 puff at 05/16/19 2116  . nitroGLYCERIN (NITROSTAT) SL tablet 0.4 mg  0.4 mg Sublingual Q5 Min x 3 PRN Saundra Shelling, MD      . ondansetron (ZOFRAN) injection 4 mg  4 mg Intravenous Q6H PRN Saundra Shelling, MD   4 mg at 05/11/19 0747  .  sacubitril-valsartan (ENTRESTO) 24-26 mg per tablet  1 tablet Oral BID Isaias Cowman, MD   Stopped at 05/16/19 2054  . sodium chloride flush (NS) 0.9 % injection 3 mL  3 mL Intravenous Q12H Pyreddy, Reatha Harps, MD   3 mL at 05/15/19 1107  . sodium chloride flush (NS) 0.9 % injection 3 mL  3 mL Intravenous PRN Pyreddy, Pavan, MD      . sodium chloride flush (NS) 0.9 % injection 3 mL  3 mL Intravenous Q12H Corey Skains, MD   3 mL at 05/16/19 2116  . spironolactone (ALDACTONE) tablet 25 mg  25 mg Oral Daily Isaias Cowman, MD   Stopped at 05/16/19 1000  . venlafaxine XR (EFFEXOR-XR) 24 hr capsule 75 mg  75 mg Oral Q breakfast Lavella Hammock, MD   75 mg at 05/16/19 1651    Musculoskeletal: Strength & Muscle Tone: within normal limits Gait & Station: normal Patient leans: N/A  Psychiatric Specialty Exam: Physical Exam  Nursing note and vitals reviewed. Constitutional: He is oriented to person, place, and time. He appears well-developed and well-nourished. No distress.  HENT:  Head: Normocephalic and atraumatic.  Eyes: EOM are normal.  Neck: Normal range of motion.  Cardiovascular: Normal rate and regular rhythm.  Respiratory: Effort normal. No respiratory distress.  Musculoskeletal: Normal range of motion.  Neurological: He is alert and oriented to person, place, and time.    Review of Systems  Constitutional: Negative.   HENT: Negative.    Respiratory: Positive for shortness of breath (off and on, uses oxygen PRN).   Cardiovascular: Negative.   Gastrointestinal: Negative.   Genitourinary: Negative.   Musculoskeletal: Negative.   Neurological: Positive for dizziness (with some blood pressure medications).  Psychiatric/Behavioral: Negative for depression and suicidal ideas. The patient is nervous/anxious.   Quit tobacco in 1977   Blood pressure 102/65, pulse 64, temperature 98.7 F (37.1 C), temperature source Oral, resp. rate 18, height 6' (1.829 m), weight 80.2 kg, SpO2 99 %.Body mass index is 23.98 kg/m.  General Appearance: Casual and Neat  Eye Contact:  Good  Speech:  Clear and Coherent and Normal Rate  Volume:  Normal  Mood:  Euthymic  Affect:  Congruent  Thought Process:  Goal Directed and Descriptions of Associations: Intact  Orientation:  Full (Time, Place, and Person)  Thought Content:  Logical  Suicidal Thoughts:  No  Homicidal Thoughts:  No  Memory:  Good  Judgement:  Good  Insight:  Good  Psychomotor Activity:  Normal  Concentration:  Concentration: Good and Attention Span: Good  Recall:  Good  Fund of Knowledge:  Good  Language:  Good  Akathisia:  No  Handed:  Right  AIMS (if indicated):     Assets:  Communication Skills Desire for Improvement Financial Resources/Insurance Housing Social Support  ADL's:  Intact  Cognition:  WNL  Sleep:   Adequate     Treatment Plan Summary: Daily contact with patient to assess and evaluate symptoms and progress in treatment and Medication management KARSTEN HOWRY is a 85 y.o. male with a history of heart disease, who states he is anxious about the newest heart medication that makes his BP drop and causes him to feel dizzy.  I will discontinue the Klonopin as this may be contributing, and he does not think he needs it.  He states he wakes up feeling short of breath which causes some anxiety, but once he has lasix, his SOB goes away and he feels fine.  Patient  has requested to take his Lasix at 8 AM and 8 PM to see if this would improve his breathing in the morning. Start venlafaxine X are 75 mg daily for depression and anxiety, suspect this could help to increase patient's blood pressure as hypertension is a known side effect.  Patient is made aware of this risk. Discussed use of CPAP for improving thoracic pressures and cardiac output overnight, as well as evaluating for and treating any underlying obstructive sleep apnea.  Patient is agreeable to trial of AutoPap.  Disposition: No evidence of imminent risk to self or others at present.   Patient does not meet criteria for psychiatric inpatient admission. Supportive therapy provided about ongoing stressors. Psychiatry will continue to follow to make further medication recommendations.  Lavella Hammock, MD 05/16/2019 9:25 PM

## 2019-05-16 NOTE — Progress Notes (Signed)
New referral for outpatient Palliative to follow at home received following a palliative Medicine consult. CMRN Jennifer aware. Patient information faxed to referral. Flo Shanks BSN, RN, [place] [phone]

## 2019-05-16 NOTE — Evaluation (Signed)
Occupational Therapy Evaluation Patient Details Name: Kristopher Fernandez MRN: [id number] DOB: [birth year] Today's Date: 05/16/2019    History of Present Illness Pt. is a 85 yo male who arrived at [facility] with complaints of chest pain, SOB, arm pain, and dizziness who was admitted for acute on chronic CHF and NSTEMI. PMH includes CAD s/p MI with stenting, HTN, hyperlipidemia, bradycardia, pacemaker placement, and basal cell carcinoma.    Clinical Impression   Pt. presents with weakness, limited activity tolerance, and limited functional mobility which hinders his ability to complete basic ADL and IADL functioning. Pt. resides at home alone. Pt. was independent with ADLs, and IADL functioning: including meal preparation, and medication management. Pt. BP 114/45, SO2 93-96 on 2LO2, HR 58. Pt. Required modified independence to walk to the toilet with a RW. Pt. could benefit from OT services for ADL training, A/E training, and pt. education about energy conservation, work simplification techniques, home modification, and DME. No follow-up OT services are warranted at this time.    Follow Up Recommendations  No OT follow up    Equipment Recommendations       Recommendations for Other Services       Precautions / Restrictions Precautions Precautions: Fall Restrictions Weight Bearing Restrictions: No      Mobility Bed Mobility Overal bed mobility: Independent             General bed mobility comments: Good sitting balance  Transfers Overall transfer level: Modified independent Equipment used: Rolling walker (2 wheeled)                  Balance     Sitting balance-Leahy Scale: Good                                     ADL either performed or assessed with clinical judgement   ADL Overall ADL's : Needs assistance/impaired Eating/Feeding: Set up;Modified independent   Grooming: Set up;Minimal assistance   Upper Body Bathing: Set up   Lower Body Bathing: Set  up   Upper Body Dressing : Independent   Lower Body Dressing: Modified independent   Toilet Transfer: Modified Independent;RW           Functional mobility during ADLs: Modified independent       Vision Patient Visual Report: No change from baseline       Perception     Praxis      Pertinent Vitals/Pain Pain Assessment: No/denies pain     Hand Dominance Right   Extremity/Trunk Assessment Upper Extremity Assessment Upper Extremity Assessment: Overall WFL for tasks assessed           Communication Communication Communication: No difficulties   Cognition Arousal/Alertness: Awake/alert Behavior During Therapy: WFL for tasks assessed/performed Overall Cognitive Status: Within Functional Limits for tasks assessed                                     General Comments       Exercises     Shoulder Instructions      Home Living Family/patient expects to be discharged to:: Private residence Living Arrangements: Alone Available Help at Discharge: Family;Friend(s) Type of Home: House Home Access: Stairs to enter [employer] of Steps: 2 Entrance Stairs-Rails: Can reach both Home Layout: One level     Bathroom Shower/Tub: Hospital doctor  Toilet: Handicapped height     Home Equipment: [place] - 2 wheels;Cane - single point;Bedside commode;Shower seat;Grab bars - toilet;Grab bars - tub/shower;Wheelchair - manual          Prior Functioning/Environment Level of Independence: Needs assistance  Gait / Transfers Assistance Needed: Ambulates with spc, drives. No falls reported in the last 12 months ADL's / Homemaking Assistance Needed: Independent with ADLs, assist from dtr with IADLs            OT Problem List: Decreased strength;Decreased activity tolerance;Decreased knowledge of use of DME or AE;Impaired UE functional use;Decreased range of motion;Decreased coordination      OT Treatment/Interventions:      OT  Goals(Current goals can be found in the care plan section) Acute Rehab OT Goals Patient Stated Goal: Return home and prior level of function OT Goal Formulation: With patient Potential to Achieve Goals: Good  OT Frequency:     Barriers to D/C:            Co-evaluation              AM-PAC OT "6 Clicks" Daily Activity     Outcome Measure Help from another person eating meals?: None Help from another person taking care of personal grooming?: None Help from another person toileting, which includes using toliet, bedpan, or urinal?: A Little Help from another person bathing (including washing, rinsing, drying)?: A Little Help from another person to put on and taking off regular upper body clothing?: None Help from another person to put on and taking off regular lower body clothing?: A Little 6 Click Score: 21   End of Session    Activity Tolerance: Patient tolerated treatment well Patient left: in chair  OT Visit Diagnosis: Muscle weakness (generalized) (M62.81)                Time: 1335-1400 OT Time Calculation (min): 25 min Charges:  OT General Charges $OT Visit: 1 Visit OT Evaluation $OT Eval Moderate Complexity: 1 Mod Harrel Carina, MS, OTR/L  Harrel Carina 05/16/2019, 5:13 PM

## 2019-05-16 NOTE — Progress Notes (Addendum)
[place] [facility] [place] NAME: Kristopher Fernandez    MR#:  [id number]  DATE OF BIRTH:  12/07/1932  SUBJECTIVE:   Chief Complaint  Patient presents with  . Chest Pain  . Shortness of Breath  . Arm Pain  . Dizziness   Patient sitting up in bed. He is still complaining of dizziness as if the room is spinning. She is also complaining of horizontal diplopia without other associated neurological symptoms. BP remains low. He is concerned that about his breathing and states" I am not making any progress, I think I am taking a turn for the worse".  REVIEW OF SYSTEMS:  Review of Systems  Constitutional: Positive for malaise/fatigue. Negative for chills, fever and weight loss.  HENT: Positive for hearing loss. Negative for congestion and sore throat.   Eyes: Negative for blurred vision and double vision.  Respiratory: Positive for shortness of breath and wheezing. Negative for cough.   Cardiovascular: Positive for orthopnea. Negative for chest pain, palpitations and leg swelling.  Gastrointestinal: Negative for abdominal pain, diarrhea, nausea and vomiting.  Genitourinary: Positive for frequency. Negative for dysuria and urgency.  Musculoskeletal: Negative for myalgias.  Skin: Negative for rash.  Neurological: Positive for dizziness. Negative for sensory change, speech change, focal weakness and headaches.       Diplopia  Psychiatric/Behavioral: Negative for depression. The patient is nervous/anxious.    DRUG ALLERGIES:  No Known Allergies VITALS:  Blood pressure (!) 114/45, pulse (!) 55, temperature 97.7 F (36.5 C), temperature source Oral, resp. rate 20, height 6' (1.829 m), weight 80.2 kg, SpO2 97 %. PHYSICAL EXAMINATION:   GENERAL:  85 y.o.-year-old patient lying in the bed with no acute distress.  EYES: Pupils equal, round, reactive to light and accommodation. No scleral icterus. Extraocular muscles intact.  HEENT: Head atraumatic, normocephalic.  Oropharynx and nasopharynx clear.  NECK:  Supple, no jugular venous distention. No thyroid enlargement, no tenderness.  LUNGS: Decrease breath sounds bilaterally, mild wheezing improved from yesterday. No use of accessory muscles of respiration.  CARDIOVASCULAR: S1, S2 normal. No murmurs, rubs, or gallops.  ABDOMEN: Soft, nontender, nondistended. Bowel sounds present. No organomegaly or mass.  EXTREMITIES: Trace pedal edema, No cyanosis, or clubbing.  NEUROLOGIC: Cranial nerves II through XII are intact. Muscle strength 5/5 in all extremities. Sensation intact. Gait not checked.  PSYCHIATRIC: The patient is alert and oriented x 3.  SKIN: No obvious rash, lesion, or ulcer.   DATA REVIEWED:  LABORATORY PANEL:  Male CBC Recent Labs  Lab 05/16/19 0811  WBC 5.7  HGB 12.6*  HCT 38.0*  PLT 148*   ------------------------------------------------------------------------------------------------------------------ Chemistries  Recent Labs  Lab 05/16/19 0507  NA 134*  K 4.8  CL 97*  CO2 30  GLUCOSE 110*  BUN 38*  CREATININE 1.56*  CALCIUM 8.5*  MG 2.7*   RADIOLOGY:  Ct Head Wo Contrast  Result Date: 05/16/2019 CLINICAL DATA:  Altered mental status, dizziness EXAM: CT HEAD WITHOUT CONTRAST TECHNIQUE: Contiguous axial images were obtained from the base of the skull through the vertex without intravenous contrast. COMPARISON:  None. FINDINGS: Brain: There is atrophy and chronic small vessel disease changes. Areas of low-density in the basal ganglia bilaterally compatible with chronic lacunar infarcts. No acute intracranial abnormality. Specifically, no hemorrhage, hydrocephalus, mass lesion, acute infarction, or significant intracranial injury. Vascular: No hyperdense vessel or unexpected calcification. Skull: No acute calvarial abnormality. Sinuses/Orbits: Visualized paranasal sinuses and mastoids clear. Orbital soft tissues unremarkable. Other: None IMPRESSION:  Atrophy, chronic  microvascular disease. No acute intracranial abnormality. Electronically Signed   By: Rolm Baptise M.D.   On: 05/16/2019 08:46   ASSESSMENT AND PLAN:   85 y.o. male with history of CAD status post MI and multiple stents, hypertension, hyperlipidemia, bradycardia, pacemaker placement, basal cell carcinoma presenting with chest pain and shortness of breath.  1.  Acute on chronic systolic Congestive Heart Failure- Likely volume overload also with underlying anxiety - EF estimated at 20 to 25% - Spironolactone (EF<35% and GFR>30 with good monitoring follow-up) and Imdur - Continue Entresto per cardiology recommendations - Beta-Blockade: Carvedilol  - Diuretics: Furosemide 40 mg BID to oral - diureses >1L negative per day approaching euvolemia. Renal function at baseline - Low salt diet  - Continue to monitor renal functions - Cardiology input appreciated  2. Non-ST elevation MI-likely demand ischemia from severe LV systolic dysfunction - Status post cardiac cath no blockage noted - Continue statin with goal LDL less than 70 mg/dL - Beta-blocker as above - Continue aspirin  3.  Hypertension - BP remains low with persistent dizziness and diplopia without focal neurological deficit - CT head obtained and shows no acute intracranial abnormality - Continue Lasix to PO as above - Continue current BP meds - Continue to monitor  4.  Hypokalemia - Improved, mag 2.5 - Check labs in am  5.  DVT prophylaxis -subcu heparin  6. Anxiety/Depression - Psych following   All the records are reviewed and case discussed with Care Management/Social Worker. Management plans discussed with the patient, family and they are in agreement.  CODE STATUS: DNR  TOTAL TIME TAKING CARE OF THIS PATIENT: 36 minutes.   More than 50% of the time was spent in counseling/coordination of care: YES  POSSIBLE D/C IN 2 DAYS, DEPENDING ON CLINICAL CONDITION.   on 05/16/2019 at 2:37 PM  This patient was staffed  with Dr. Posey Pronto, [facility] who personally evaluated patient, reviewed documentation and agreed with assessment and plan of care as above.  Rufina Falco, DNP, FNP-BC [facility] Nurse Practitioner   Between 7am to 6pm - Pager [phone]  After 6pm go to [email or web address] - Proofreader  [facility] [place] [facility]  Office  [phone]  CC: Primary care physician; Medicine, [facility]  Note: This dictation was prepared with Dragon dictation along with smaller phrase technology. Any transcriptional errors that result from this process are unintentional.

## 2019-05-17 ENCOUNTER — Inpatient Hospital Stay (HOSPITAL_COMMUNITY)
Admission: AD | Admit: 2019-05-17 | Discharge: 2019-05-23 | DRG: 286 | Disposition: A | Discharge status: Home Health | Payer: Medicare Other | Source: Other Acute Inpatient Hospital | Attending: Cardiology | Admitting: Cardiology

## 2019-05-17 ENCOUNTER — Encounter (HOSPITAL_COMMUNITY)
Admission: AD | Disposition: A | Discharge status: Home Health | Payer: Self-pay | Source: Other Acute Inpatient Hospital | Attending: Cardiology

## 2019-05-17 ENCOUNTER — Encounter (HOSPITAL_COMMUNITY): Payer: Self-pay | Admitting: General Practice

## 2019-05-17 ENCOUNTER — Other Ambulatory Visit: Payer: Self-pay

## 2019-05-17 DIAGNOSIS — I350 Nonrheumatic aortic (valve) stenosis: Secondary | ICD-10-CM | POA: Diagnosis present

## 2019-05-17 DIAGNOSIS — N183 Chronic kidney disease, stage 3 (moderate): Secondary | ICD-10-CM | POA: Diagnosis present

## 2019-05-17 DIAGNOSIS — R42 Dizziness and giddiness: Secondary | ICD-10-CM | POA: Diagnosis not present

## 2019-05-17 DIAGNOSIS — Z7189 Other specified counseling: Secondary | ICD-10-CM

## 2019-05-17 DIAGNOSIS — I130 Hypertensive heart and chronic kidney disease with heart failure and stage 1 through stage 4 chronic kidney disease, or unspecified chronic kidney disease: Principal | ICD-10-CM | POA: Diagnosis present

## 2019-05-17 DIAGNOSIS — I252 Old myocardial infarction: Secondary | ICD-10-CM | POA: Diagnosis not present

## 2019-05-17 DIAGNOSIS — Z7982 Long term (current) use of aspirin: Secondary | ICD-10-CM

## 2019-05-17 DIAGNOSIS — I248 Other forms of acute ischemic heart disease: Secondary | ICD-10-CM | POA: Diagnosis present

## 2019-05-17 DIAGNOSIS — I5023 Acute on chronic systolic (congestive) heart failure: Secondary | ICD-10-CM

## 2019-05-17 DIAGNOSIS — Z515 Encounter for palliative care: Secondary | ICD-10-CM

## 2019-05-17 DIAGNOSIS — I255 Ischemic cardiomyopathy: Secondary | ICD-10-CM | POA: Diagnosis present

## 2019-05-17 DIAGNOSIS — Z85828 Personal history of other malignant neoplasm of skin: Secondary | ICD-10-CM

## 2019-05-17 DIAGNOSIS — I2510 Atherosclerotic heart disease of native coronary artery without angina pectoris: Secondary | ICD-10-CM | POA: Diagnosis present

## 2019-05-17 DIAGNOSIS — I509 Heart failure, unspecified: Secondary | ICD-10-CM

## 2019-05-17 DIAGNOSIS — I951 Orthostatic hypotension: Secondary | ICD-10-CM | POA: Diagnosis not present

## 2019-05-17 DIAGNOSIS — Z7902 Long term (current) use of antithrombotics/antiplatelets: Secondary | ICD-10-CM

## 2019-05-17 DIAGNOSIS — E871 Hypo-osmolality and hyponatremia: Secondary | ICD-10-CM | POA: Diagnosis present

## 2019-05-17 DIAGNOSIS — I5021 Acute systolic (congestive) heart failure: Secondary | ICD-10-CM | POA: Diagnosis not present

## 2019-05-17 DIAGNOSIS — Z950 Presence of cardiac pacemaker: Secondary | ICD-10-CM

## 2019-05-17 DIAGNOSIS — I5022 Chronic systolic (congestive) heart failure: Secondary | ICD-10-CM | POA: Diagnosis not present

## 2019-05-17 DIAGNOSIS — R270 Ataxia, unspecified: Secondary | ICD-10-CM | POA: Diagnosis not present

## 2019-05-17 DIAGNOSIS — I5043 Acute on chronic combined systolic (congestive) and diastolic (congestive) heart failure: Secondary | ICD-10-CM | POA: Diagnosis not present

## 2019-05-17 DIAGNOSIS — I639 Cerebral infarction, unspecified: Secondary | ICD-10-CM | POA: Diagnosis not present

## 2019-05-17 DIAGNOSIS — R55 Syncope and collapse: Secondary | ICD-10-CM | POA: Diagnosis not present

## 2019-05-17 DIAGNOSIS — J449 Chronic obstructive pulmonary disease, unspecified: Secondary | ICD-10-CM | POA: Diagnosis present

## 2019-05-17 DIAGNOSIS — I214 Non-ST elevation (NSTEMI) myocardial infarction: Secondary | ICD-10-CM | POA: Diagnosis not present

## 2019-05-17 DIAGNOSIS — Z87891 Personal history of nicotine dependence: Secondary | ICD-10-CM

## 2019-05-17 DIAGNOSIS — E785 Hyperlipidemia, unspecified: Secondary | ICD-10-CM | POA: Diagnosis present

## 2019-05-17 DIAGNOSIS — Z79899 Other long term (current) drug therapy: Secondary | ICD-10-CM | POA: Diagnosis not present

## 2019-05-17 DIAGNOSIS — I6310 Cerebral infarction due to embolism of unspecified precerebral artery: Secondary | ICD-10-CM

## 2019-05-17 DIAGNOSIS — G459 Transient cerebral ischemic attack, unspecified: Secondary | ICD-10-CM | POA: Diagnosis not present

## 2019-05-17 DIAGNOSIS — I6340 Cerebral infarction due to embolism of unspecified cerebral artery: Secondary | ICD-10-CM

## 2019-05-17 DIAGNOSIS — Z9861 Coronary angioplasty status: Secondary | ICD-10-CM

## 2019-05-17 DIAGNOSIS — Z7951 Long term (current) use of inhaled steroids: Secondary | ICD-10-CM

## 2019-05-17 DIAGNOSIS — H5500 Unspecified nystagmus: Secondary | ICD-10-CM | POA: Diagnosis present

## 2019-05-17 HISTORY — DX: Heart failure, unspecified: I50.9

## 2019-05-17 HISTORY — PX: RIGHT HEART CATH: CATH118263

## 2019-05-17 LAB — CREATININE, SERUM
Creatinine, Ser: 1.43 mg/dL — ABNORMAL HIGH (ref 0.61–1.24)
GFR calc Af Amer: 52 mL/min — ABNORMAL LOW (ref >60)
GFR calc non Af Amer: 45 mL/min — ABNORMAL LOW (ref >60)

## 2019-05-17 LAB — BASIC METABOLIC PANEL
Anion gap: 8 (ref 5–15)
BUN: 39 mg/dL — ABNORMAL HIGH (ref 8–23)
CO2: 28 mmol/L (ref 22–32)
Calcium: 8.6 mg/dL — ABNORMAL LOW (ref 8.9–10.3)
Chloride: 99 mmol/L (ref 98–111)
Creatinine, Ser: 1.34 mg/dL — ABNORMAL HIGH (ref 0.61–1.24)
GFR calc Af Amer: 56 mL/min — ABNORMAL LOW (ref >60)
GFR calc non Af Amer: 48 mL/min — ABNORMAL LOW (ref >60)
Glucose, Bld: 111 mg/dL — ABNORMAL HIGH (ref 70–99)
Potassium: 5 mmol/L (ref 3.5–5.1)
Sodium: 135 mmol/L (ref 135–145)

## 2019-05-17 LAB — POCT I-STAT EG7
Acid-Base Excess: 5 mmol/L — ABNORMAL HIGH (ref 0.0–2.0)
Acid-Base Excess: 6 mmol/L — ABNORMAL HIGH (ref 0.0–2.0)
Bicarbonate: 31.5 mmol/L — ABNORMAL HIGH (ref 20.0–28.0)
Bicarbonate: 31.9 mmol/L — ABNORMAL HIGH (ref 20.0–28.0)
Calcium, Ion: 1.24 mmol/L (ref 1.15–1.40)
Calcium, Ion: 1.22 mmol/L (ref 1.15–1.40)
HCT: 38 % — ABNORMAL LOW (ref 39.0–52.0)
HCT: 38 % — ABNORMAL LOW (ref 39.0–52.0)
Hemoglobin: 12.9 g/dL — ABNORMAL LOW (ref 13.0–17.0)
Hemoglobin: 12.9 g/dL — ABNORMAL LOW (ref 13.0–17.0)
O2 Saturation: 65 %
O2 Saturation: 70 %
Potassium: 5.2 mmol/L — ABNORMAL HIGH (ref 3.5–5.1)
Potassium: 5.2 mmol/L — ABNORMAL HIGH (ref 3.5–5.1)
Sodium: 134 mmol/L — ABNORMAL LOW (ref 135–145)
Sodium: 133 mmol/L — ABNORMAL LOW (ref 135–145)
TCO2: 33 mmol/L — ABNORMAL HIGH (ref 22–32)
TCO2: 33 mmol/L — ABNORMAL HIGH (ref 22–32)
pCO2, Ven: 50.6 mmHg (ref 44.0–60.0)
pCO2, Ven: 51.4 mmHg (ref 44.0–60.0)
pH, Ven: 7.401 (ref 7.250–7.430)
pH, Ven: 7.401 (ref 7.250–7.430)
pO2, Ven: 34 mmHg (ref 32.0–45.0)
pO2, Ven: 37 mmHg (ref 32.0–45.0)

## 2019-05-17 LAB — CBC
HCT: 42.5 % (ref 39.0–52.0)
Hemoglobin: 13.9 g/dL (ref 13.0–17.0)
MCH: 28.6 pg (ref 26.0–34.0)
MCHC: 32.7 g/dL (ref 30.0–36.0)
MCV: 87.4 fL (ref 80.0–100.0)
Platelets: 182 10*3/uL (ref 150–400)
RBC: 4.86 MIL/uL (ref 4.22–5.81)
RDW: 13.7 % (ref 11.5–15.5)
WBC: 6.5 10*3/uL (ref 4.0–10.5)
nRBC: 0 % (ref 0.0–0.2)

## 2019-05-17 LAB — MAGNESIUM: Magnesium: 2.6 mg/dL — ABNORMAL HIGH (ref 1.7–2.4)

## 2019-05-17 LAB — PHOSPHORUS: Phosphorus: 4.3 mg/dL (ref 2.5–4.6)

## 2019-05-17 SURGERY — RIGHT HEART CATH
Anesthesia: LOCAL

## 2019-05-17 MED ORDER — HYDRALAZINE HCL 20 MG/ML IJ SOLN: 10.0000 mg | INTRAMUSCULAR | Status: AC | PRN

## 2019-05-17 MED ORDER — ACETAMINOPHEN 325 MG PO TABS: 650.0000 mg | ORAL_TABLET | ORAL | Status: DC | PRN

## 2019-05-17 MED ORDER — ONDANSETRON HCL 4 MG/2ML IJ SOLN
4.0000 mg | Freq: Four times a day (QID) | INTRAMUSCULAR | Status: DC | PRN
  Administered 2019-05-18 – 2019-05-21 (×5): 4 mg via INTRAVENOUS
  Filled 2019-05-17 (×5): qty 2

## 2019-05-17 MED ORDER — ATORVASTATIN CALCIUM 80 MG PO TABS
80.0000 mg | ORAL_TABLET | Freq: Every day | ORAL | Status: DC
  Administered 2019-05-17 – 2019-05-22 (×6): 80 mg via ORAL
  Filled 2019-05-17 (×6): qty 1

## 2019-05-17 MED ORDER — BUDESONIDE-FORMOTEROL FUMARATE 160-4.5 MCG/ACT IN AERO: 2.0000 | INHALATION_SPRAY | Freq: Two times a day (BID) | RESPIRATORY_TRACT | 12 refills | Status: DC

## 2019-05-17 MED ORDER — RANOLAZINE ER 500 MG PO TB12
500.0000 mg | ORAL_TABLET | Freq: Two times a day (BID) | ORAL | Status: DC
  Administered 2019-05-17 – 2019-05-23 (×12): 500 mg via ORAL
  Filled 2019-05-17 (×12): qty 1

## 2019-05-17 MED ORDER — LABETALOL HCL 5 MG/ML IV SOLN: 10.0000 mg | INTRAVENOUS | Status: AC | PRN

## 2019-05-17 MED ORDER — LIDOCAINE HCL (PF) 1 % IJ SOLN
INTRAMUSCULAR | Status: DC | PRN
  Administered 2019-05-17: 2 mL

## 2019-05-17 MED ORDER — SODIUM CHLORIDE 0.9 % IV SOLN: 250.0000 mL | INTRAVENOUS | Status: DC | PRN

## 2019-05-17 MED ORDER — CARVEDILOL 3.125 MG PO TABS
3.1250 mg | ORAL_TABLET | Freq: Two times a day (BID) | ORAL | Status: DC
  Administered 2019-05-17 – 2019-05-23 (×12): 3.125 mg via ORAL
  Filled 2019-05-17 (×12): qty 1

## 2019-05-17 MED ORDER — SODIUM CHLORIDE 0.9% FLUSH: 3.0000 mL | INTRAVENOUS | Status: DC | PRN

## 2019-05-17 MED ORDER — CLOPIDOGREL BISULFATE 75 MG PO TABS
75.0000 mg | ORAL_TABLET | Freq: Every day | ORAL | Status: DC
  Administered 2019-05-18 – 2019-05-22 (×5): 75 mg via ORAL
  Filled 2019-05-17 (×5): qty 1

## 2019-05-17 MED ORDER — HEPARIN (PORCINE) IN NACL 1000-0.9 UT/500ML-% IV SOLN
INTRAVENOUS | Status: AC
  Filled 2019-05-17: qty 500

## 2019-05-17 MED ORDER — SODIUM CHLORIDE 0.9% FLUSH
3.0000 mL | Freq: Two times a day (BID) | INTRAVENOUS | Status: DC
  Administered 2019-05-17 – 2019-05-18 (×3): 3 mL via INTRAVENOUS

## 2019-05-17 MED ORDER — LIDOCAINE HCL (PF) 1 % IJ SOLN
INTRAMUSCULAR | Status: AC
  Filled 2019-05-17: qty 30

## 2019-05-17 MED ORDER — VENLAFAXINE HCL ER 75 MG PO CP24
75.0000 mg | ORAL_CAPSULE | Freq: Every day | ORAL | Status: DC
  Administered 2019-05-18 – 2019-05-23 (×6): 75 mg via ORAL
  Filled 2019-05-17 (×6): qty 1

## 2019-05-17 MED ORDER — LOSARTAN POTASSIUM 25 MG PO TABS
12.5000 mg | ORAL_TABLET | Freq: Every day | ORAL | Status: DC
  Administered 2019-05-17 – 2019-05-21 (×5): 12.5 mg via ORAL
  Filled 2019-05-17 (×6): qty 1

## 2019-05-17 MED ORDER — ASPIRIN EC 81 MG PO TBEC
81.0000 mg | DELAYED_RELEASE_TABLET | Freq: Every day | ORAL | Status: DC
  Administered 2019-05-18 – 2019-05-22 (×5): 81 mg via ORAL
  Filled 2019-05-17 (×5): qty 1

## 2019-05-17 MED ORDER — MOMETASONE FURO-FORMOTEROL FUM 200-5 MCG/ACT IN AERO
2.0000 | INHALATION_SPRAY | Freq: Two times a day (BID) | RESPIRATORY_TRACT | Status: DC
  Administered 2019-05-17 – 2019-05-23 (×11): 2 via RESPIRATORY_TRACT
  Filled 2019-05-17: qty 8.8

## 2019-05-17 MED ORDER — LOSARTAN POTASSIUM 25 MG PO TABS: 12.5000 mg | ORAL_TABLET | Freq: Every day | ORAL | Status: DC

## 2019-05-17 MED ORDER — SODIUM CHLORIDE 0.9 % IV SOLN: INTRAVENOUS | Status: DC

## 2019-05-17 MED ORDER — HEPARIN SODIUM (PORCINE) 5000 UNIT/ML IJ SOLN
5000.0000 [IU] | Freq: Three times a day (TID) | INTRAMUSCULAR | Status: DC
  Administered 2019-05-17 – 2019-05-22 (×14): 5000 [IU] via SUBCUTANEOUS
  Filled 2019-05-17 (×13): qty 1

## 2019-05-17 MED ORDER — SODIUM CHLORIDE 0.9% FLUSH
3.0000 mL | Freq: Two times a day (BID) | INTRAVENOUS | Status: DC
  Administered 2019-05-17 – 2019-05-23 (×8): 3 mL via INTRAVENOUS

## 2019-05-17 MED ORDER — SODIUM CHLORIDE 0.9% FLUSH
3.0000 mL | Freq: Two times a day (BID) | INTRAVENOUS | Status: DC
  Administered 2019-05-17 – 2019-05-18 (×2): 3 mL via INTRAVENOUS

## 2019-05-17 MED ORDER — ONDANSETRON HCL 4 MG/2ML IJ SOLN: 4.0000 mg | Freq: Four times a day (QID) | INTRAMUSCULAR | Status: DC | PRN

## 2019-05-17 MED ORDER — VENLAFAXINE HCL ER 75 MG PO CP24: 75.0000 mg | ORAL_CAPSULE | Freq: Every day | ORAL | 0 refills | Status: DC

## 2019-05-17 MED ORDER — HEPARIN SODIUM (PORCINE) 5000 UNIT/ML IJ SOLN: 5000.0000 [IU] | Freq: Three times a day (TID) | INTRAMUSCULAR | Status: DC

## 2019-05-17 MED ORDER — HEPARIN (PORCINE) IN NACL 1000-0.9 UT/500ML-% IV SOLN
INTRAVENOUS | Status: DC | PRN
  Administered 2019-05-17: 500 mL

## 2019-05-17 SURGICAL SUPPLY — 8 items
CATH BALLN WEDGE 5F 110CM (CATHETERS) ×2 IMPLANT
COVER DOME SNAP 22 D (MISCELLANEOUS) ×2 IMPLANT
GUIDEWIRE .025 260CM (WIRE) ×2 IMPLANT
PACK CARDIAC CATHETERIZATION (CUSTOM PROCEDURE TRAY) ×2 IMPLANT
SHEATH GLIDE SLENDER 4/5FR (SHEATH) ×2 IMPLANT
TRANSDUCER W/STOPCOCK (MISCELLANEOUS) ×2 IMPLANT
TUBING ART PRESS 72  MALE/FEM (TUBING) ×1
TUBING ART PRESS 72 MALE/FEM (TUBING) ×1 IMPLANT

## 2019-05-17 NOTE — Interval H&P Note (Signed)
History and Physical Interval Note:  05/17/2019 2:17 PM  Kristopher Fernandez  has presented today for surgery, with the diagnosis of Heart Failure.  The various methods of treatment have been discussed with the patient and family. After consideration of risks, benefits and other options for treatment, the patient has consented to  Procedure(s): RIGHT HEART CATH (N/A) as a surgical intervention.  The patient's history has been reviewed, patient examined, no change in status, stable for surgery.  I have reviewed the patient's chart and labs.  Questions were answered to the patient's satisfaction.     Chelisa Hennen [employer]

## 2019-05-17 NOTE — Plan of Care (Signed)
Still held all cardiac medications this shift due to low BP, however BP did start to trend up after 2300. CPAP was added last night  Problem: Activity: Goal: Ability to tolerate increased activity will improve Outcome: Progressing Note:  Was able to sit in chair for a little bit last night, tolerated well   Problem: Nutrition: Goal: Adequate nutrition will be maintained Outcome: Progressing   Problem: Coping: Goal: Level of anxiety will decrease Outcome: Progressing   Problem: Elimination: Goal: Will not experience complications related to urinary retention Outcome: Progressing   Problem: Pain Managment: Goal: General experience of comfort will improve Outcome: Progressing Note:  No complaints of pain this shift   Problem: Safety: Goal: Ability to remain free from injury will improve Outcome: Progressing

## 2019-05-17 NOTE — Discharge Summary (Signed)
Kristopher Fernandez, is a 85 y.o. male  DOB Sep 17, 1932  MRN [id number].  Admission date:  05/09/2019  Admitting Physician  Saundra Shelling, MD  Discharge Date:  05/17/2019   Primary MD  Medicine, [facility]  Recommendations for primary care physician for things to follow:   Transferred to [facility] hospital for Dobutrex infusion  Admission Diagnosis  NSTEMI (non-ST elevated myocardial infarction) [facility]) [I21.4]   Discharge Diagnosis  NSTEMI (non-ST elevated myocardial infarction) [facility]) [I21.4]    Principal Problem:   Chest pain Active Problems:   Shortness of breath   Grief reaction   Palliative care by specialist   Goals of care, counseling/discussion   Acute on chronic systolic congestive heart failure [facility])      Past Medical History:  Diagnosis Date  . Anginal pain ([place])   . Aortic stenosis 07/03/2014   Overview:  Mild with calculated aortic valve area of 1.05cm2  . Basal cell carcinoma, ear 03/31/2015  . Bradycardia 07/03/2014  . DDD (degenerative disc disease), lumbar 08/06/2015  . H/O cardiac catheterization 07/03/2014   Overview:  Cypher stent proximal and distal RCA 07/18/05 and TAXUS stent mid LAD 07/20/05 at [facility]  . HTN (hypertension) 07/03/2014  . Hyperlipidemia 07/03/2014  . Lumbosacral radiculopathy at S1 08/22/2015  . MI (myocardial infarction) ([place]) 07/03/2014   Overview:  Mi 07/17/05  . Myocardial infarction ([place])   . Normocytic normochromic anemia 09/14/2015  . Pacemaker 07/03/2014   Overview:  Dual chamber pacemaker 08/11/11  . Shortness of breath dyspnea   . SOB (shortness of breath) on exertion 05/13/2016    Past Surgical History:  Procedure Laterality Date  . CARDIAC CATHETERIZATION Bilateral 07/14/2016   Procedure: Right/Left Heart Cath and Coronary Angiography;  Surgeon: Isaias Cowman, MD;  Location: [place] CV LAB;  Service: Cardiovascular;  Laterality: Bilateral;  . CORONARY ANGIOPLASTY    . HERNIA REPAIR    . LEFT HEART CATH AND CORONARY ANGIOGRAPHY N/A 05/09/2019   Procedure: LEFT HEART CATH AND CORONARY ANGIOGRAPHY;  Surgeon: Corey Skains, MD;  Location: [place] CV LAB;  Service: Cardiovascular;  Laterality: N/A;       History of present illness and  Hospital Course:     Kindly see H&P for history of present illness and admission details, please review complete Labs, Consult reports and Test reports for all details in brief  HPI  from the history and physical done on the day of admission 85 year old male with history of cardiac cirrhosis, essential hypertension, hyperlipidemia, CAD, PCI came in because of chest pain, shortness of breath, admitted for unstable angina.   Hospital Course   #1 non-ST elevation MI secondary to demand ischemia from severe left LV dysfunction, patient is seen by [facility] cardiology with Dr. Micah Noel. Isaias Cowman, patient had cardiac cath which showed no blockages, patient maintained on statins, aspirin, Plavix. 2.  Acute on chronic systolic heart failure, ejection fraction 10-15%, cardiology recommended to continue Entresto, Coreg, diuretics but patient developed severe hypotension with dizziness, diplopia without any neurological deficit, because of severe hypertension with symptoms unable to give Entresto, Lasix, spironolactone, Coreg.  Yesterday, patient is seen by cardiology Dr. Saralyn Pilar, will be transferred to [facility] for Dobutrex infusion for at least 1 to 2 days as per their note. 3.  Hypokalemia, improved. 4. ischemic cardiomyopathy with EF 10-15 by left ventriculography, due to hypotension is \\unable   to tolerate CHF medications, patient will be transferred to [facility] today for Dobutrex infusion. #  5. depression, seen by psychiatrist, that he could discontinued the Klonopin,  started on Wellbutrin, commended CPAP at night to improve thoracic pressures, cardiac output overnight.    Discharge Condition: stable   Follow UP  Follow-up Information    [facility] Follow up.   Specialty:  Cardiac Rehabilitation Why:  Your Cardiologist has referred you to Cardiac Rehab. Cardiac Rehab is currently closed due to COVID-19 but will be offering Virtual appts soon. We will contact you within 1-2 weeks. Contact information: [place] [phone] [address] [place] [phone]            Discharge Instructions  and  Discharge Medications     Discharge Instructions    AMB Referral to Cardiac Rehabilitation - Phase II   Complete by:  As directed    Diagnosis:  Heart Failure (see criteria below if ordering Phase II)   Heart Failure Type:  Chronic Systolic     Allergies as of 05/17/2019   No Known Allergies     Medication List    STOP taking these medications   budesonide 0.5 MG/2ML nebulizer solution Commonly known as:  PULMICORT   ipratropium-albuterol 0.5-2.5 (3) MG/3ML Soln Commonly known as:  DUONEB   isosorbide mononitrate 60 MG 24 hr tablet Commonly known as:  IMDUR   metoprolol succinate 25 MG 24 hr tablet Commonly known as:  TOPROL-XL   montelukast 10 MG tablet Commonly known as:  SINGULAIR   nitroGLYCERIN 0.6 MG SL tablet Commonly known as:  NITROSTAT   ProAir HFA 108 (90 Base) MCG/ACT inhaler Generic drug:  albuterol   ramipril 5 MG capsule Commonly known as:  ALTACE   zaleplon 5 MG capsule Commonly known as:  SONATA     TAKE these medications   aspirin EC 81 MG tablet Take 81 mg by mouth daily.   atorvastatin 80 MG tablet Commonly known as:  LIPITOR Take 80 mg by mouth daily.   budesonide-formoterol 160-4.5 MCG/ACT inhaler Commonly known as:  Symbicort Inhale 2 puffs into the lungs 2 (two) times daily.   clopidogrel 75 MG tablet Commonly known as:  PLAVIX Take 75 mg  by mouth daily.   ranolazine 500 MG 12 hr tablet Commonly known as:  RANEXA Take 500 mg by mouth 2 (two) times daily.   venlafaxine XR 75 MG 24 hr capsule Commonly known as:  EFFEXOR-XR Take 1 capsule (75 mg total) by mouth daily with breakfast. Start taking on:  May 18, 2019         Diet and Activity recommendation: See Discharge Instructions above   Consults obtained -cardiology, psychiatry.     Major procedures and Radiology Reports - PLEASE review detailed and final reports for all details, in brief -     Dg Chest 2 View  Result Date: 05/10/2019 CLINICAL DATA:  Shortness of breath EXAM: CHEST - 2 VIEW COMPARISON:  05/09/2019 FINDINGS: Small left pleural effusion with basilar atelectasis. Lungs are otherwise clear. Left chest wall pacemaker leads are in unchanged position. IMPRESSION: Small left pleural effusion with basilar atelectasis. Electronically Signed   By: Ulyses Jarred M.D.   On: 05/10/2019 20:20   Dg Chest 2 View  Result Date: 05/09/2019 CLINICAL DATA:  Chest pain, shortness of breath, and left arm pain beginning last night. Former smoker. EXAM: CHEST - 2 VIEW COMPARISON:  Chest radiographs 12/26/2018 and CTA 12/27/2018 FINDINGS: A dual lead pacemaker remains in place. The cardiac silhouette is borderline enlarged. The lungs are less  well inflated on the prior study and there is mild bibasilar opacity compatible with atelectasis. Small pleural effusions are noted. No edema or pneumothorax is identified. No acute osseous abnormality is seen. IMPRESSION: Small bilateral pleural effusions and mild bibasilar atelectasis. Electronically Signed   By: Logan Bores M.D.   On: 05/09/2019 11:42   Ct Head Wo Contrast  Result Date: 05/16/2019 CLINICAL DATA:  Altered mental status, dizziness EXAM: CT HEAD WITHOUT CONTRAST TECHNIQUE: Contiguous axial images were obtained from the base of the skull through the vertex without intravenous contrast. COMPARISON:  None. FINDINGS: Brain:  There is atrophy and chronic small vessel disease changes. Areas of low-density in the basal ganglia bilaterally compatible with chronic lacunar infarcts. No acute intracranial abnormality. Specifically, no hemorrhage, hydrocephalus, mass lesion, acute infarction, or significant intracranial injury. Vascular: No hyperdense vessel or unexpected calcification. Skull: No acute calvarial abnormality. Sinuses/Orbits: Visualized paranasal sinuses and mastoids clear. Orbital soft tissues unremarkable. Other: None IMPRESSION: Atrophy, chronic microvascular disease. No acute intracranial abnormality. Electronically Signed   By: Rolm Baptise M.D.   On: 05/16/2019 08:46    Micro Results     Recent Results (from the past 240 hour(s))  SARS Coronavirus 2 (CEPHEID - Performed in [place] hospital lab), Hosp Order     Status: None   Collection Time: 05/09/19 11:47 AM  Result Value Ref Range Status   SARS Coronavirus 2 NEGATIVE NEGATIVE Final    Comment: (NOTE) If result is NEGATIVE SARS-CoV-2 target nucleic acids are NOT DETECTED. The SARS-CoV-2 RNA is generally detectable in upper and lower  respiratory specimens during the acute phase of infection. The lowest  concentration of SARS-CoV-2 viral copies this assay can detect is 250  copies / mL. A negative result does not preclude SARS-CoV-2 infection  and should not be used as the sole basis for treatment or other  patient management decisions.  A negative result may occur with  improper specimen collection / handling, submission of specimen other  than nasopharyngeal swab, presence of viral mutation(s) within the  areas targeted by this assay, and inadequate number of viral copies  (<250 copies / mL). A negative result must be combined with clinical  observations, patient history, and epidemiological information. If result is POSITIVE SARS-CoV-2 target nucleic acids are DETECTED. The SARS-CoV-2 RNA is generally detectable in upper and lower   respiratory specimens dur ing the acute phase of infection.  Positive  results are indicative of active infection with SARS-CoV-2.  Clinical  correlation with patient history and other diagnostic information is  necessary to determine patient infection status.  Positive results do  not rule out bacterial infection or co-infection with other viruses. If result is PRESUMPTIVE POSTIVE SARS-CoV-2 nucleic acids MAY BE PRESENT.   A presumptive positive result was obtained on the submitted specimen  and confirmed on repeat testing.  While 2019 novel coronavirus  (SARS-CoV-2) nucleic acids may be present in the submitted sample  additional confirmatory testing may be necessary for epidemiological  and / or clinical management purposes  to differentiate between  SARS-CoV-2 and other Sarbecovirus currently known to infect humans.  If clinically indicated additional testing with an alternate test  methodology [phone]) is advised. The SARS-CoV-2 RNA is generally  detectable in upper and lower respiratory sp ecimens during the acute  phase of infection. The expected result is Negative. Fact Sheet for Patients:  StrictlyIdeas.no Fact Sheet for Healthcare Providers: BankingDealers.co.za This test is not yet approved or cleared by the [place] FDA and  has been authorized for detection and/or diagnosis of SARS-CoV-2 by FDA under an Emergency Use Authorization (EUA).  This EUA will remain in effect (meaning this test can be used) for the duration of the COVID-19 declaration under Section 564(b)(1) of the Act, 21 U.S.C. section 360bbb-3(b)(1), unless the authorization is terminated or revoked sooner. Performed at [facility], [address] [place], [place] [address]        Today   Subjective:   Kamerin Grumbine is feeling little better, less dizziness today as BP improved, will go to [facility] hospital. Objective:    Blood pressure 123/81, pulse 61, temperature 97.8 F (36.6 C), temperature source Oral, resp. rate 19, height 6' (1.829 m), weight 79.6 kg, SpO2 99 %.   Intake/Output Summary (Last 24 hours) at 05/17/2019 0938 Last data filed at 05/17/2019 7035 Gross per 24 hour  Intake 3 ml  Output 1125 ml  Net -1122 ml    Exam Awake Alert, Oriented x 3, No new F.N deficits, Normal affect .AT,PERRAL Supple Neck,No JVD, No cervical lymphadenopathy appriciated.  Diminished breath sounds  Bilaterally,RRR,No Gallops,Rubs or new Murmurs, No Parasternal Heave +ve B.Sounds, Abd Soft, Non tender, No organomegaly appriciated, No rebound -guarding or rigidity. No Cyanosis, Clubbing or edema, No new Rash or bruise  Data Review   CBC w Diff:  Lab Results  Component Value Date   WBC 5.7 05/16/2019   HGB 12.6 (L) 05/16/2019   HGB 14.0 10/30/2012   HCT 38.0 (L) 05/16/2019   HCT 40.8 10/30/2012   PLT 148 (L) 05/16/2019   PLT 145 (L) 10/30/2012   LYMPHOPCT 9 10/08/2016   MONOPCT 7 10/08/2016   EOSPCT 1 10/08/2016   BASOPCT 0 10/08/2016    CMP:  Lab Results  Component Value Date   NA 135 05/17/2019   NA 137 03/28/2014   K 5.0 05/17/2019   K 4.2 03/28/2014   CL 99 05/17/2019   CL 104 03/28/2014   CO2 28 05/17/2019   CO2 30 03/28/2014   BUN 39 (H) 05/17/2019   BUN 16 03/28/2014   CREATININE 1.34 (H) 05/17/2019   CREATININE 1.29 03/28/2014   PROT 6.0 (L) 05/09/2019   ALBUMIN 3.1 (L) 05/09/2019   BILITOT 1.4 (H) 05/09/2019   ALKPHOS 97 05/09/2019   AST 22 05/09/2019   ALT 18 05/09/2019  .   Total Time in preparing paper work, data evaluation and todays exam - 35 minutes  Epifanio Lesches M.D on 05/17/2019 at 9:38 AM    Note: This dictation was prepared with Dragon dictation along with smaller phrase technology. Any transcriptional errors that result from this process are unintentional.

## 2019-05-17 NOTE — TOC Transition Note (Signed)
Transition of Care [facility]) - CM/SW Discharge Note   Patient Details  Name: Kristopher Fernandez MRN: [id number] Date of Birth: 11/27/1932  [facility] of [facility]) CM/SW Contact:  Elza Rafter, RN Phone Number: 05/17/2019, 10:22 AM   Clinical Narrative:   Patient transferring to [facility] today via [place].     Final next level of care: [place] Barriers to Discharge: No Barriers Identified   Patient Goals and CMS Choice Patient states their goals for this hospitalization and ongoing recovery are:: Continue with home health-unsure of who he has CMS Medicare.gov Compare Post Acute Care list provided to:: Patient Choice offered to / list presented to : Patient  Discharge Placement                       Discharge Plan and Services   Discharge Planning Services: CM Consult Post Acute Care Choice: Home Health                    HH Arranged: PT, RN, OT, Nurse's Aide [place] Agency: [facility] Date [place]: 05/17/19 Time [place]: 1022 Representative spoke with at [place]: [place] ([place]) Interventions     Readmission Risk Interventions Readmission Risk Prevention Plan 05/16/2019  Transportation Screening Complete  PCP or Specialist Appt within 5-7 Days Complete  Home Care Screening Complete  Medication Review (RN CM) Complete  Some recent data might be hidden

## 2019-05-17 NOTE — Progress Notes (Signed)
Called and gave report to the receiving hospital nurse at this time. All questions answered. IV to remain intact. Also received a call from [place], gave them verbal report. All questions answered. Wenda Low [facility]

## 2019-05-17 NOTE — Progress Notes (Signed)
Pharmacy Electrolyte Monitoring Consult:  Pharmacy consulted to assist in monitoring and replacing electrolytes in this 85 y.o. male admitted on 05/09/2019 with Chest Pain; Shortness of Breath; Arm Pain; and Dizziness   Labs:  Sodium (mmol/L)  Date Value  05/17/2019 135  03/28/2014 137   Potassium (mmol/L)  Date Value  05/17/2019 5.0  03/28/2014 4.2   Magnesium (mg/dL)  Date Value  05/17/2019 2.6 (H)   Phosphorus (mg/dL)  Date Value  05/17/2019 4.3   Calcium (mg/dL)  Date Value  05/17/2019 8.6 (L)   Calcium, Total (mg/dL)  Date Value  03/28/2014 8.8   Albumin (g/dL)  Date Value  05/09/2019 3.1 (L)   Corrected Calcium: 9.32  Assessment/Plan: Potassium wnl's but still rising slowly - Mag slightly elevated  Furosemide 40mg  PO Q12hr. Patient continued on spironolactone 25mg  daily and Entresto  No replacement warranted at this time.  Serum creatinine improving.  May consider decreasing spironolactone dose if potassium continues to rise.    Will obtain electrolytes with am labs.   Pharmacy will continue to monitor and adjust per consult.    Lu Duffel, PharmD, BCPS Clinical Pharmacist 05/17/2019 7:29 AM

## 2019-05-17 NOTE — Progress Notes (Signed)
Pt wore full face mask until this time. Pt stated the mask was difficult to get use to but breathing was much easier throughout the night. Pt stated he did not get much sleep.

## 2019-05-17 NOTE — Progress Notes (Signed)
[facility]  SUBJECTIVE: Laying in bed, hypotensive, blood pressure 80/50, unable to tolerate CHF medication   Vitals:   05/16/19 2322 05/17/19 0434 05/17/19 0437 05/17/19 0727  BP: 131/62  135/82 123/81  Pulse: 60  70 61  Resp:   20 19  Temp:   97.6 F (36.4 C) 97.8 F (36.6 C)  TempSrc:   Oral Oral  SpO2: 98%  95% 99%  Weight:  79.6 kg    Height:         Intake/Output Summary (Last 24 hours) at 05/17/2019 0804 Last data filed at 05/17/2019 5456 Gross per 24 hour  Intake 3 ml  Output 1225 ml  Net -1222 ml      PHYSICAL EXAM  General: Chronically ill-appearing HEENT:  Normocephalic and atramatic Neck:  No JVD.  Lungs: Clear bilaterally to auscultation and percussion. Heart: HRRR . Normal S1 and S2 without gallops or murmurs.  Abdomen: Bowel sounds are positive, abdomen soft and non-tender  Msk:  Back normal, normal gait. Normal strength and tone for age. Extremities: No clubbing, cyanosis or edema.   Neuro: Alert and oriented X 3. Psych:  Good affect, responds appropriately   LABS: Basic Metabolic Panel: Recent Labs    05/16/19 0507 05/17/19 0335  NA 134* 135  K 4.8 5.0  CL 97* 99  CO2 30 28  GLUCOSE 110* 111*  BUN 38* 39*  CREATININE 1.56* 1.34*  CALCIUM 8.5* 8.6*  MG 2.7* 2.6*  PHOS  --  4.3   Liver Function Tests: No results for input(s): AST, ALT, ALKPHOS, BILITOT, PROT, ALBUMIN in the last 72 hours. No results for input(s): LIPASE, AMYLASE in the last 72 hours. CBC: Recent Labs    05/16/19 0811  WBC 5.7  HGB 12.6*  HCT 38.0*  MCV 87.2  PLT 148*   Cardiac Enzymes: No results for input(s): CKTOTAL, CKMB, CKMBINDEX, TROPONINI in the last 72 hours. BNP: Invalid input(s): POCBNP D-Dimer: No results for input(s): DDIMER in the last 72 hours. Hemoglobin A1C: No results for input(s): HGBA1C in the last 72 hours. Fasting Lipid Panel: No results for input(s): CHOL, HDL, LDLCALC, TRIG, CHOLHDL, LDLDIRECT in the last 72 hours. Thyroid  Function Tests: No results for input(s): TSH, T4TOTAL, T3FREE, THYROIDAB in the last 72 hours.  Invalid input(s): FREET3 Anemia Panel: No results for input(s): VITAMINB12, FOLATE, FERRITIN, TIBC, IRON, RETICCTPCT in the last 72 hours.  Ct Head Wo Contrast  Result Date: 05/16/2019 CLINICAL DATA:  Altered mental status, dizziness EXAM: CT HEAD WITHOUT CONTRAST TECHNIQUE: Contiguous axial images were obtained from the base of the skull through the vertex without intravenous contrast. COMPARISON:  None. FINDINGS: Brain: There is atrophy and chronic small vessel disease changes. Areas of low-density in the basal ganglia bilaterally compatible with chronic lacunar infarcts. No acute intracranial abnormality. Specifically, no hemorrhage, hydrocephalus, mass lesion, acute infarction, or significant intracranial injury. Vascular: No hyperdense vessel or unexpected calcification. Skull: No acute calvarial abnormality. Sinuses/Orbits: Visualized paranasal sinuses and mastoids clear. Orbital soft tissues unremarkable. Other: None IMPRESSION: Atrophy, chronic microvascular disease. No acute intracranial abnormality. Electronically Signed   By: Rolm Baptise M.D.   On: 05/16/2019 08:46     Echo   TELEMETRY: Normal sinus rhythm:  ASSESSMENT AND PLAN:  Principal Problem:   Chest pain Active Problems:   Shortness of breath   Grief reaction   Palliative care by specialist   Goals of care, counseling/discussion   Acute on chronic systolic congestive heart failure ([place])    1.  Acute on chronic systolic congestive heart failure, currently appears euvolemic 2.  Hypotension, unable to tolerate CHF medications 3.  Patent stents proximal mid RCA, proximal left circumflex by cardiac cath 05/09/2019 4.  Ischemic cardiomyopathy, estimated LV EF 10 to 15% by left ventriculography 5.  Underlying COPD 6.  Chronic kidney disease  Recommendations  1.  Transfer to [facility] for advanced heart failure care.   Discussed with Dr. Haroldine Laws who agrees patient may benefit from dobutamine infusion 48 to 72 hours.    Isaias Cowman, MD, PhD, [facility] 05/17/2019 8:04 AM

## 2019-05-17 NOTE — H&P (Signed)
Advanced Heart Failure Team History and Physical Note   PCP:  Medicine, [facility]  PCP-Cardiology: No primary care provider on file.     Reason for Admission: Possible low output HF.    HPI:    85 y.o. with history of CAD s/p multiple PCIs, ischemic cardiomyopathy, sick sinus syndrome s/p PPM, and CKD stage 3 was transferred from [facility] due to concern for low output HF.    Patient has a long history of CAD detailed in the [place] below.  He also has an ischemic cardiomyopathy, with prior echo in 1/20 showing EF 40-45%.  He had a pacemaker placed for bradycardia in 1287, I am uncertain of the make from notes and the patient does not remember either.  He was admitted with somewhat atypical chest pain on 5/20, TnI was mildly elevated at 0.08, with serial troponins there was no trend (maximal 0.09).  He underwent left heart cath, showing stable disease with no intervention planned (chronic occlusion of mid LCx and 85% ostial PDA).  His echo showed that EF was lower than prior at 20-25%.  He was thought to be volume overloaded.  He was diuresed with IV Lasix, and Coreg + Entresto were started.  Patient's BP fell significantly with initiation of cardiac meds and he had orthostatic symptoms.  All his cardiac meds had to be stopped.  There was concern that he may have low output HF and may require inotropic support, so he was transferred to [facility].    Currently, he is feeling well.   He did not get any BP-active medication this morning.  His BP currently is 128/59.  He denies dyspnea or lightheadedness.  He has not had anymore chest pain.  He is not pacing currently, in NSR with PACs.  His admission ECG showed AV sequential pacing.   Review of Systems: All systems reviewed and negative except as per HPI.   Home Medications Prior to Admission medications   Medication Sig Start Date End Date Taking? Authorizing Provider  aspirin EC 81 MG tablet Take 81 mg by mouth daily.    [provider]  atorvastatin (LIPITOR) 80 MG tablet Take 80 mg by mouth daily.    [provider]  budesonide-formoterol (SYMBICORT) 160-4.5 MCG/ACT inhaler Inhale 2 puffs into the lungs 2 (two) times daily. 05/17/19 05/16/20  Epifanio Lesches, MD  clopidogrel (PLAVIX) 75 MG tablet Take 75 mg by mouth daily.    [provider]  ranolazine (RANEXA) 500 MG 12 hr tablet Take 500 mg by mouth 2 (two) times daily.    [provider]  venlafaxine XR (EFFEXOR-XR) 75 MG 24 hr capsule Take 1 capsule (75 mg total) by mouth daily with breakfast. 05/18/19   Epifanio Lesches, MD    Past Medical History: 1. Low back pain/degenerative disc disease.  2. Sick sinus syndrome: Dual chamber PPM placed 8/67, uncertain make.  3. Aortic stenosis: Mild to moderate on last echo.  4. CAD: Extensive history.  - 7/06: Inferior MI with Cypher DES to prox and distal RCA.  - 07/20/05: Taxus DES to mLAD.  - 1/11: Xience DES to OM1 - 4/15: Overlapping Xience DES x 2 to ostial and proximal RCA.  - LHC (5/20): Nonobstructive disease in LAD, occluded mid LCx (chronic), 85% ostial PDA.  No intervention.  5. Chronic systolic CHF: Ischemic cardiomyopathy.   - Echo (1/20) with EF 40-45%.  - Echo (5/20) with EF 20-25%, normal RV, mild-moderate MR, mild-moderate AS with mean gradient 11  mmHg and AVA 1.47 cm^2.  6. CKD: Stage 3.  7. COPD: Remote smoking. 8. HTN 9. Hyperlipidemia 10. depression  Past Surgical History: Past Surgical History:  Procedure Laterality Date  . CARDIAC CATHETERIZATION Bilateral 07/14/2016   Procedure: Right/Left Heart Cath and Coronary Angiography;  Surgeon: Isaias Cowman, MD;  Location: [place] [facility];  Service: Cardiovascular;  Laterality: Bilateral;  . CORONARY ANGIOPLASTY    . HERNIA REPAIR    . LEFT HEART CATH AND CORONARY ANGIOGRAPHY N/A 05/09/2019   Procedure: LEFT HEART CATH AND CORONARY ANGIOGRAPHY;  Surgeon: Corey Skains, MD;  Location: [place] [facility];   Service: Cardiovascular;  Laterality: N/A;    Family History:  Family History  Problem Relation Age of Onset  . Bladder Cancer Neg Hx   . Kidney cancer Neg Hx   . Prostate cancer Neg Hx     Social History: Social History   Socioeconomic History  . Marital status: Widowed    Spouse name: Not on file  . Number of children: 1  . Years of education: Not on file  . Highest education level: Not on file  Occupational History  . Occupation: retired  Scientific laboratory technician  . Financial resource strain: Not very hard  . Food insecurity:    Worry: Never true    Inability: Never true  . Transportation needs:    Medical: No    Non-medical: No  Tobacco Use  . Smoking status: Former Smoker    Years: 10.00    Types: Cigarettes    Last attempt to quit: 1977    Years since quitting: 43.4  . Smokeless tobacco: Former Network engineer and Sexual Activity  . Alcohol use: No  . Drug use: No  . Sexual activity: Not on file  Lifestyle  . Physical activity:    Days per week: Not on file    Minutes per session: Not on file  . Stress: Only a little  Relationships  . Social connections:    Talks on phone: More than three times a week    Gets together: Not on file    Attends religious service: Not on file    Active member of club or organization: Not on file    Attends meetings of clubs or organizations: Not on file    Relationship status: Widowed  Other Topics Concern  . Not on file  Social History Narrative  . Not on file    Allergies:  No Known Allergies  Objective:    Vital Signs:   Temp:  [97.6 F (36.4 C)-98.7 F (37.1 C)] 97.8 F (36.6 C) (05/28 0727) Pulse Rate:  [55-70] 61 (05/28 0727) Resp:  [18-20] 19 (05/28 0727) BP: (81-135)/(45-82) 123/81 (05/28 0727) SpO2:  [95 %-99 %] 99 % (05/28 0727) Weight:  [79.6 kg] 79.6 kg (05/28 0434)   There were no vitals filed for this visit.   Physical Exam     General:  Well appearing. No respiratory difficulty HEENT: Normal  Neck: Supple. JVP not elevated. Carotids 2+ bilat; no bruits. No lymphadenopathy or thyromegaly appreciated. Cor: PMI nondisplaced. Regular rate & rhythm. 2/6 SEM RUSB with soft S2.  Lungs: Clear Abdomen: Soft, nontender, nondistended. No hepatosplenomegaly. No bruits or masses. Good bowel sounds. Extremities: No cyanosis, clubbing, rash, edema Neuro: Alert & oriented x 3, cranial nerves grossly intact. moves all 4 extremities w/o difficulty. Affect pleasant.   Telemetry   NSR with PACs, no pacing (personally reviewed)  EKG   A-V sequential pacing (  personally reviewed)  Labs     Basic Metabolic Panel: Recent Labs  Lab 05/12/19 0521 05/13/19 0622 05/13/19 0721 05/14/19 0609 05/15/19 0403 05/16/19 0507 05/17/19 0335  NA 137 139  --  136 137 134* 135  K 3.8 4.6  --  4.3 4.6 4.8 5.0  CL 99 96*  --  94* 97* 97* 99  CO2 32 35*  --  32 33* 30 28  GLUCOSE 104* 109*  --  109* 102* 110* 111*  BUN 21 25*  --  33* 34* 38* 39*  CREATININE 1.23 1.36*  --  1.42* 1.44* 1.56* 1.34*  CALCIUM 8.2* 8.5*  --  8.6* 8.3* 8.5* 8.6*  MG 2.2  --  2.5*  --   --  2.7* 2.6*  PHOS  --   --   --   --   --   --  4.3    Liver Function Tests: No results for input(s): AST, ALT, ALKPHOS, BILITOT, PROT, ALBUMIN in the last 168 hours. No results for input(s): LIPASE, AMYLASE in the last 168 hours. No results for input(s): AMMONIA in the last 168 hours.  CBC: Recent Labs  Lab 05/16/19 0811  WBC 5.7  HGB 12.6*  HCT 38.0*  MCV 87.2  PLT 148*    Cardiac Enzymes: No results for input(s): CKTOTAL, CKMB, CKMBINDEX, TROPONINI in the last 168 hours.  BNP: BNP (last 3 results) Recent Labs    05/09/19 1034  BNP 1,462.0*    ProBNP (last 3 results) No results for input(s): PROBNP in the last 8760 hours.   CBG: No results for input(s): GLUCAP in the last 168 hours.  Coagulation Studies: No results for input(s): LABPROT, INR in the last 72 hours.  Imaging: Ct Head Wo Contrast  Result  Date: 05/16/2019 CLINICAL DATA:  Altered mental status, dizziness EXAM: CT HEAD WITHOUT CONTRAST TECHNIQUE: Contiguous axial images were obtained from the base of the skull through the vertex without intravenous contrast. COMPARISON:  None. FINDINGS: Brain: There is atrophy and chronic small vessel disease changes. Areas of low-density in the basal ganglia bilaterally compatible with chronic lacunar infarcts. No acute intracranial abnormality. Specifically, no hemorrhage, hydrocephalus, mass lesion, acute infarction, or significant intracranial injury. Vascular: No hyperdense vessel or unexpected calcification. Skull: No acute calvarial abnormality. Sinuses/Orbits: Visualized paranasal sinuses and mastoids clear. Orbital soft tissues unremarkable. Other: None IMPRESSION: Atrophy, chronic microvascular disease. No acute intracranial abnormality. Electronically Signed   By: Rolm Baptise M.D.   On: 05/16/2019 08:46      Assessment/Plan   1. Acute on chronic systolic CHF: Ischemic cardiomyopathy.  Echo in 1/20 with EF 40-45%, but now down to 20-25% on echo from 5/20.  Patient was initially admitted at [facility] and transferred here due to concern for low output HF/need for inotropes. He was diuresed at [facility], and on exam actually appears euvolemic currently.  The cause of the fall in EF is uncertain, he had coronary angiogram at [facility] with stable anatomy (no intervention).  I question whether he may be pacing his RV more frequently, causing worsening of EF (initial ECG showed AV sequential pacing though currently he is not pacing).  Currently, SBP in 120s and no orthostatic symptoms.  He is off all BP-active meds.  Question now is whether he has low output HF, or if his BP was just unable to tolerate the combination of Entresto and Coreg.  - I will take him for RHC this afternoon.  If cardiac output is low, may require PICC  placement and use of milrinone.  If CO is preserved, will likely need to re-attempt initiation  of cardiac meds but very slowly.  I suspect that he will not be able to tolerate Entresto. We discussed risks/benefits of procedure and he agrees.  - I am going to try to figure out what type of device he has and get it interrogated.  If he has a significant RV pacing burden, may benefit from upgrade to CRT.  - No diuretics yet as he looks euvolemic.  Will reassess by RHC today.  2. CAD: Admitted with chest pain and mild troponin elevation (no trend).  Suspect that the mild troponin elevation was demand ischemia from CHF.  He has now been diuresed.  Cath showed stable anatomy, no intervention.  - Continue ASA 81, Plavix 75.  - Continue statin - Continue ranolazine.  3. CKD: Stage 3.  Creatinine has been stable, 1.34 today.  4. Aortic stenosis: Mild to moderate on echo this admission.   Loralie Champagne, MD 05/17/2019, 1:02 PM  Advanced Heart Failure Team Pager [phone] (M-F; [place])  Please contact [facility] for night-coverage after hours (4p -7a ) and weekends on [email or web address]

## 2019-05-18 ENCOUNTER — Inpatient Hospital Stay (HOSPITAL_COMMUNITY): Payer: Medicare Other

## 2019-05-18 ENCOUNTER — Encounter: Payer: Self-pay | Admitting: *Deleted

## 2019-05-18 ENCOUNTER — Encounter (HOSPITAL_COMMUNITY): Payer: Self-pay | Admitting: Cardiology

## 2019-05-18 DIAGNOSIS — I5022 Chronic systolic (congestive) heart failure: Secondary | ICD-10-CM

## 2019-05-18 LAB — CBC WITH DIFFERENTIAL/PLATELET
Abs Immature Granulocytes: 0.03 10*3/uL (ref 0.00–0.07)
Basophils Absolute: 0 10*3/uL (ref 0.0–0.1)
Basophils Relative: 0 %
Eosinophils Absolute: 0.2 10*3/uL (ref 0.0–0.5)
Eosinophils Relative: 3 %
HCT: 39.6 % (ref 39.0–52.0)
Hemoglobin: 13.1 g/dL (ref 13.0–17.0)
Immature Granulocytes: 1 %
Lymphocytes Relative: 16 %
Lymphs Abs: 0.9 10*3/uL (ref 0.7–4.0)
MCH: 28.5 pg (ref 26.0–34.0)
MCHC: 33.1 g/dL (ref 30.0–36.0)
MCV: 86.1 fL (ref 80.0–100.0)
Monocytes Absolute: 0.8 10*3/uL (ref 0.1–1.0)
Monocytes Relative: 14 %
Neutro Abs: 3.5 10*3/uL (ref 1.7–7.7)
Neutrophils Relative %: 66 %
Platelets: 156 10*3/uL (ref 150–400)
RBC: 4.6 MIL/uL (ref 4.22–5.81)
RDW: 13.5 % (ref 11.5–15.5)
WBC: 5.4 10*3/uL (ref 4.0–10.5)
nRBC: 0 % (ref 0.0–0.2)

## 2019-05-18 LAB — BASIC METABOLIC PANEL
Anion gap: 9 (ref 5–15)
BUN: 28 mg/dL — ABNORMAL HIGH (ref 8–23)
CO2: 26 mmol/L (ref 22–32)
Calcium: 9 mg/dL (ref 8.9–10.3)
Chloride: 96 mmol/L — ABNORMAL LOW (ref 98–111)
Creatinine, Ser: 1.3 mg/dL — ABNORMAL HIGH (ref 0.61–1.24)
GFR calc Af Amer: 58 mL/min — ABNORMAL LOW (ref >60)
GFR calc non Af Amer: 50 mL/min — ABNORMAL LOW (ref >60)
Glucose, Bld: 104 mg/dL — ABNORMAL HIGH (ref 70–99)
Potassium: 4.9 mmol/L (ref 3.5–5.1)
Sodium: 131 mmol/L — ABNORMAL LOW (ref 135–145)

## 2019-05-18 MED ORDER — MECLIZINE HCL 25 MG PO TABS
25.0000 mg | ORAL_TABLET | Freq: Two times a day (BID) | ORAL | Status: DC | PRN
  Administered 2019-05-18 – 2019-05-19 (×3): 25 mg via ORAL
  Filled 2019-05-18 (×3): qty 1

## 2019-05-18 NOTE — Progress Notes (Signed)
[id number] Reviewed heart failure booklet with patient discussed zones and has scale at home. Discussed the importance of weighing daily. Reviewed low sodium, heart healthy diet with the patient given handouts.Barnet Pall, RN,BSN 05/18/2019 2:27 PM

## 2019-05-18 NOTE — Evaluation (Signed)
Physical Therapy Evaluation Patient Details Name: Kristopher Fernandez MRN: [id number] DOB: 10-02-1932 Today's Date: 05/18/2019   History of Present Illness  pt is an 85 y/o male with PMH significant for MI, pacer HTN, DDD, CHF, admitted with SOB likely due to acute on chronic systolic CHF.  pt s/p CATH that showed stable anatomy with no need for intervention.  Pt developed dizziness and nausea with spinning sensation day of eval.  Clinical Impression  Pt is close to baseline functioning with minimal dizziness with mobility and far from the problem he had earlier today.  Pt is safe mobilizing with his cane if he has no severe episodes and should be safe at home with his daughter's intermittent assist. There are no further acute PT needs, but resume HHPT as scheduled.  Will sign off at this time.     Follow Up Recommendations Home health PT;Other (comment)(resume)    Equipment Recommendations  None recommended by PT    Recommendations for Other Services       Precautions / Restrictions Precautions Precautions: Fall(minimal fall risk)      Mobility  Bed Mobility Overal bed mobility: Independent             General bed mobility comments: Good sitting balance  Transfers Overall transfer level: Modified independent Equipment used: None             General transfer comment: used hands appropriately   Ambulation/Gait Ambulation/Gait assistance: Supervision Gait Distance (Feet): 240 Feet Assistive device: Straight cane Gait Pattern/deviations: Step-through pattern Gait velocity: moderate to slower Gait velocity interpretation: 1.31 - 2.62 ft/sec, indicative of limited community ambulator General Gait Details: pt displayed no overt deviation or LOB ambulating while scanning, backing up and abrupt turns while using his SPC.  Stairs            Wheelchair Mobility    Modified Rankin (Stroke Patients Only)       Balance     Sitting balance-Leahy Scale: Good       Standing balance-Leahy Scale: Fair                               Pertinent Vitals/Pain Pain Assessment: No/denies pain    Home Living Family/patient expects to be discharged to:: Private residence Living Arrangements: Children(daughter) Available Help at Discharge: Family;Friend(s) Type of Home: House Home Access: Stairs to enter Entrance Stairs-Rails: Can reach both Entrance Stairs-Number of Steps: 2 Home Layout: One level Home Equipment: Walker - 2 wheels;Cane - single point;Bedside commode;Shower seat;Grab bars - toilet;Grab bars - tub/shower;Wheelchair - manual      Prior Function Level of Independence: Needs assistance   Gait / Transfers Assistance Needed: Ambulates with spc, drives. No falls reported in the last 12 months  ADL's / Homemaking Assistance Needed: Independent with ADLs, assist from dtr with IADLs        Hand Dominance   Dominant Hand: Right    Extremity/Trunk Assessment   Upper Extremity Assessment Upper Extremity Assessment: Overall WFL for tasks assessed(mild proximal weakness, but functional)    Lower Extremity Assessment Lower Extremity Assessment: Overall WFL for tasks assessed       Communication   Communication: No difficulties  Cognition Arousal/Alertness: Awake/alert Behavior During Therapy: WFL for tasks assessed/performed Overall Cognitive Status: Within Functional Limits for tasks assessed  General Comments General comments (skin integrity, edema, etc.): Completed basic vestibular assessment.  General questioning was not conclusive for vestibular involvement and testing for posterior and horizontal BPPV found to be negative with no symptoms or nystagmus overall L or R side.  Pt reports mild dizziness that is nothing like the episode he experienced earlier today.    Exercises     Assessment/Plan    PT Assessment Patient needs continued PT services  PT Problem  List Cardiopulmonary status limiting activity;Decreased mobility;Decreased balance       PT Treatment Interventions      PT Goals (Current goals can be found in the Care Plan section)  Acute Rehab PT Goals Patient Stated Goal: Return home and prior level of function PT Goal Formulation: All assessment and education complete, DC therapy    Frequency     Barriers to discharge        Co-evaluation               AM-PAC PT "6 Clicks" Mobility  Outcome Measure Help needed turning from your back to your side while in a flat bed without using bedrails?: None Help needed moving from lying on your back to sitting on the side of a flat bed without using bedrails?: None Help needed moving to and from a bed to a chair (including a wheelchair)?: None Help needed standing up from a chair using your arms (e.g., wheelchair or bedside chair)?: None Help needed to walk in hospital room?: None Help needed climbing 3-5 steps with a railing? : A Little 6 Click Score: 23    End of Session   Activity Tolerance: Patient tolerated treatment well Patient left: in bed;with call bell/phone within reach;Other (comment)(sitting EOB) Nurse Communication: Mobility status PT Visit Diagnosis: Difficulty in walking, not elsewhere classified (R26.2)    Time: 5176-1607 PT Time Calculation (min) (ACUTE ONLY): 31 min   Charges:   PT Evaluation $PT Eval Low Complexity: 1 Low PT Treatments $Gait Training: 8-22 mins        05/18/2019  Donnella Sham, PT Acute Rehabilitation Services [phone]  (pager) [phone]  (office)  Kristopher Fernandez 05/18/2019, 12:03 PM

## 2019-05-18 NOTE — Progress Notes (Signed)
Pt states he feels "woozy".  States he "did good until around 5AM".  States he feels lightheaded and dizzy, vision blurry.  BP 111/63 (78).  Vpaced at 65.  Sats 100% on O2 @ 2L/[place].  States earlier nausea has subsided

## 2019-05-18 NOTE — Progress Notes (Signed)
Patient ID: Kristopher Fernandez, male   DOB: 1932-05-26, 85 y.o.   MRN: [id number]     Advanced Heart Failure Rounding Note  PCP-Cardiologist: No primary care provider on file.   Subjective:    Felt great last night up until around 6 am today, then developed nausea and "dizziness" with an episode of vomiting.  On questioning, he feels a spinning sensation that seems like possible vertigo.    SBP is stable in the 110s-120s.   [employer] device interrogation: he has around 5% RV pacing.   [place] Procedural Findings (5/28): Hemodynamics (mmHg) RA mean 3 RV 38/3 PA 38/8, mean 18 PCWP mean 6 Oxygen saturations: PA 68% AO 97% Cardiac Output (Fick) 5.4  Cardiac Index (Fick) 2.68 PVR 2.2 WU   Objective:   Weight Range: 78.3 kg Body mass index is 23.41 kg/m.   Vital Signs:   Temp:  [97.6 F (36.4 C)-98.4 F (36.9 C)] 98.4 F (36.9 C) (05/29 0716) Pulse Rate:  [59-87] 69 (05/29 0716) Resp:  [12-32] 15 (05/29 0716) BP: (106-147)/(58-86) 111/63 (05/29 0801) SpO2:  [0 %-100 %] 96 % (05/29 0716) Weight:  [78.3 kg-79.5 kg] 78.3 kg (05/29 0553) Last BM Date: 05/16/19  Weight change: Filed Weights   05/17/19 1255 05/18/19 0553  Weight: 79.5 kg 78.3 kg    Intake/Output:   Intake/Output Summary (Last 24 hours) at 05/18/2019 0810 Last data filed at 05/18/2019 0555 Gross per 24 hour  Intake 649 ml  Output 1175 ml  Net -526 ml      Physical Exam    General:  Well appearing. No resp difficulty HEENT: Normal Neck: Supple. JVP not elevated. Carotids 2+ bilat; no bruits. No lymphadenopathy or thyromegaly appreciated. Cor: PMI nondisplaced. Regular rate & rhythm. No rubs, gallops or murmurs. Lungs: Clear Abdomen: Soft, nontender, nondistended. No hepatosplenomegaly. No bruits or masses. Good bowel sounds. Extremities: No cyanosis, clubbing, rash, edema Neuro: Alert & orientedx3, cranial nerves grossly intact. moves all 4 extremities w/o difficulty. Affect pleasant   Telemetry    NSR alternating with occasional V-pacing (personally reviewed)  Labs    CBC Recent Labs    05/17/19 1336 05/17/19 1434 05/18/19 0255  WBC 6.5  --  5.4  NEUTROABS  --   --  3.5  HGB 13.9 12.9*  12.9* 13.1  HCT 42.5 38.0*  38.0* 39.6  MCV 87.4  --  86.1  PLT 182  --  616   Basic Metabolic Panel Recent Labs    05/16/19 0507 05/17/19 0335 05/17/19 1336 05/17/19 1434 05/18/19 0255  NA 134* 135  --  133*  134* 131*  K 4.8 5.0  --  5.2*  5.2* 4.9  CL 97* 99  --   --  96*  CO2 30 28  --   --  26  GLUCOSE 110* 111*  --   --  104*  BUN 38* 39*  --   --  28*  CREATININE 1.56* 1.34* 1.43*  --  1.30*  CALCIUM 8.5* 8.6*  --   --  9.0  MG 2.7* 2.6*  --   --   --   PHOS  --  4.3  --   --   --    Liver Function Tests No results for input(s): AST, ALT, ALKPHOS, BILITOT, PROT, ALBUMIN in the last 72 hours. No results for input(s): LIPASE, AMYLASE in the last 72 hours. Cardiac Enzymes No results for input(s): CKTOTAL, CKMB, CKMBINDEX, TROPONINI in the last 72 hours.  BNP: BNP (last  3 results) Recent Labs    05/09/19 1034  BNP 1,462.0*    ProBNP (last 3 results) No results for input(s): PROBNP in the last 8760 hours.   D-Dimer No results for input(s): DDIMER in the last 72 hours. Hemoglobin A1C No results for input(s): HGBA1C in the last 72 hours. Fasting Lipid Panel No results for input(s): CHOL, HDL, LDLCALC, TRIG, CHOLHDL, LDLDIRECT in the last 72 hours. Thyroid Function Tests No results for input(s): TSH, T4TOTAL, T3FREE, THYROIDAB in the last 72 hours.  Invalid input(s): FREET3  Other results:   Imaging    Dg Chest 2 View  Result Date: 05/18/2019 CLINICAL DATA:  CHF EXAM: CHEST - 2 VIEW COMPARISON:  05/10/2019 FINDINGS: Left subclavian pacemaker device with leads is stable. Small left pleural effusion and basilar subsegmental atelectasis are stable. No pneumothorax. Mid and upper lungs clear. IMPRESSION: Stable basilar atelectasis and small left pleural  effusion. Electronically Signed   By: Marybelle Killings M.D.   On: 05/18/2019 07:52      Medications:     Scheduled Medications: . aspirin EC  81 mg Oral Daily  . atorvastatin  80 mg Oral q1800  . carvedilol  3.125 mg Oral BID WC  . clopidogrel  75 mg Oral Daily  . heparin  5,000 Units Subcutaneous Q8H  . losartan  12.5 mg Oral Daily  . mometasone-formoterol  2 puff Inhalation BID  . ranolazine  500 mg Oral BID  . sodium chloride flush  3 mL Intravenous Q12H  . sodium chloride flush  3 mL Intravenous Q12H  . sodium chloride flush  3 mL Intravenous Q12H  . venlafaxine XR  75 mg Oral Q breakfast     Infusions: . sodium chloride    . sodium chloride       PRN Medications:  sodium chloride, sodium chloride, acetaminophen, meclizine, ondansetron (ZOFRAN) IV, sodium chloride flush, sodium chloride flush   Assessment/Plan   1. Acute on chronic systolic CHF: Ischemic cardiomyopathy.  Echo in 1/20 with EF 40-45%, but now down to 20-25% on echo from 5/20.  Patient was initially admitted at [facility] and transferred here due to concern for low output HF/need for inotropes. He was diuresed at [facility].  The cause of the fall in EF is uncertain, he had coronary angiogram at [facility] with stable anatomy (no intervention).  He paces his RV only 5% of the time (device interrogated).  Currently, SBP in 110s-120s. RHC on 5/28 showed low filling pressures and good cardiac output.  The low BP at [facility], I think, was related to diuresis + Entresto use.  We have started him on low doses of Coreg and losartan and BP has tolerated so far. He remains euvolemic on exam today, we have not given him any diuretic.   - Continue Coreg 3.125 mg bid and losartan 12.5 mg daily.   - No diuretics yet as he looks euvolemic.  May need Lasix 20 mg daily at home.   2. CAD: Admitted with chest pain and mild troponin elevation (no trend).  Suspect that the mild troponin elevation was demand ischemia from CHF.  He has now been diuresed.   Cath showed stable anatomy, no intervention.  - Continue ASA 81, Plavix 75.  - Continue statin - Continue ranolazine.  3. CKD: Stage 3.  Creatinine stable at 1.3.  4. Aortic stenosis: Mild to moderate on echo this admission. 5. Nausea/vomiting/dizziness: Developed while in bed this morning and has had episodes over the last 3 wks.  BP  not low.  Felt better with Zofran but still "dizzy."  On questioning, has a "spinning feeling."  Possible vertigo.  - Will check orthostatics this morning.  - Will try meclizine.    Will watch today to see if meclizine helps with symptoms.  Needs PT/OT evaluation.  Hopefully can go home tomorrow.  Updated his daughter.   Length of Stay: 1  Loralie Champagne, MD  05/18/2019, 8:10 AM  Advanced Heart Failure Team Pager [phone] (M-F; 7a - 4p)  Please contact [facility] for night-coverage after hours (4p -7a ) and weekends on [email or web address]

## 2019-05-18 NOTE — Evaluation (Addendum)
Occupational Therapy Evaluation Patient Details Name: Kristopher Fernandez MRN: [id number] DOB: February 10, 1932 Today's Date: 05/18/2019    History of Present Illness pt is an 85 y/o male with PMH significant for MI, pacer HTN, DDD, CHF, admitted with SOB likely due to acute on chronic systolic CHF.  pt s/p CATH that showed stable anatomy with no need for intervention.  Pt developed dizziness and nausea with spinning sensation day of eval.   Clinical Impression   PTA Pt used SPC for mobility, independent in ADL and transfers. Pt was able to demonstrate transfers, grooming tasks, don/doff socks at supervision/Mod I. Pt feels as though he's at or very close to baseline. No dizziness (recently did vestibular eval with PT). All education/compensatory strategies complete. Pt with no questions or concerns. OT to sign off at this time. Thank you for the opportunity to serve this patient.     Follow Up Recommendations  No OT follow up    Equipment Recommendations  None recommended by OT(Pt has appropriate DME)    Recommendations for Other Services       Precautions / Restrictions Precautions Precautions: Fall(minimal fall risk) Restrictions Weight Bearing Restrictions: No      Mobility Bed Mobility Overal bed mobility: Independent             General bed mobility comments: Good sitting balance  Transfers Overall transfer level: Modified independent Equipment used: None             General transfer comment: used hands appropriately     Balance Overall balance assessment: Mild deficits observed, not formally tested   Sitting balance-Leahy Scale: Good       Standing balance-Leahy Scale: Fair                             ADL either performed or assessed with clinical judgement   ADL Overall ADL's : At baseline     Grooming: Modified independent   Upper Body Bathing: Supervision/ safety   Lower Body Bathing: Supervison/ safety   Upper Body Dressing :  Independent   Lower Body Dressing: Modified independent   Toilet Transfer: Modified Independent;RW           Functional mobility during ADLs: Modified independent       Vision Patient Visual Report: No change from baseline Vision Assessment?: No apparent visual deficits     Perception     Praxis      Pertinent Vitals/Pain Pain Assessment: No/denies pain     Hand Dominance Right   Extremity/Trunk Assessment Upper Extremity Assessment Upper Extremity Assessment: Generalized weakness   Lower Extremity Assessment Lower Extremity Assessment: Defer to PT evaluation       Communication Communication Communication: No difficulties   Cognition Arousal/Alertness: Awake/alert Behavior During Therapy: WFL for tasks assessed/performed Overall Cognitive Status: Within Functional Limits for tasks assessed                                     General Comments  Completed basic vestibular assessment.  General questioning was not conclusive for vestibular involvement and testing for posterior and horizontal BPPV found to be negative with no symptoms or nystagmus overall L or R side.  Pt reports mild dizziness that is nothing like the episode he experienced earlier today.    Exercises     Shoulder Instructions      Home Living  Family/patient expects to be discharged to:: Private residence Living Arrangements: Children(daughter) Available Help at Discharge: Family;Friend(s) Type of Home: House Home Access: Stairs to enter [employer] of Steps: 2 Entrance Stairs-Rails: Can reach both [place]: One level     Bathroom Shower/Tub: Occupational psychologist: Handicapped height     Home Equipment: Environmental consultant - 2 wheels;Cane - single point;Bedside commode;Shower seat;Grab bars - toilet;Grab bars - tub/shower;Wheelchair - manual          Prior Functioning/Environment Level of Independence: Needs assistance  Gait / Transfers Assistance  Needed: Ambulates with spc, drives. No falls reported in the last 12 months ADL's / Homemaking Assistance Needed: Independent with ADLs, assist from dtr with IADLs            OT Problem List: Decreased strength;Decreased activity tolerance;Decreased knowledge of use of DME or AE;Impaired UE functional use;Decreased range of motion;Decreased coordination      OT Treatment/Interventions:      OT Goals(Current goals can be found in the care plan section) Acute Rehab OT Goals Patient Stated Goal: Return home and prior level of function OT Goal Formulation: With patient Time For Goal Achievement: 06/01/19 Potential to Achieve Goals: Good  OT Frequency:     Barriers to D/C:            Co-evaluation              AM-PAC OT "6 Clicks" Daily Activity     Outcome Measure Help from another person eating meals?: None Help from another person taking care of personal grooming?: None Help from another person toileting, which includes using toliet, bedpan, or urinal?: None       6 Click Score: 12   End of Session Equipment Utilized During Treatment: Gait belt Nurse Communication: Mobility status  Activity Tolerance: Patient tolerated treatment well Patient left: in chair  OT Visit Diagnosis: Muscle weakness (generalized) (M62.81)                Time: 4081-4481 OT Time Calculation (min): 21 min Charges:  OT General Charges $OT Visit: 1 Visit OT Evaluation $OT Eval Low Complexity: [place] OTR/L Acute Rehabilitation Services Pager: [phone] Office: [facility] [place] 05/18/2019, 3:06 PM

## 2019-05-18 NOTE — Plan of Care (Signed)
Patient was potentially going to be discharged home today; however, he woke up this morning w/lightheadedness, dizziness, and nausea.  Treated w/IV Zofran per PRN orders and PO Meclizine per verbal order of Dr. Aundra Dubin.  Patient presently feels much better per self report; however, states he still feels "woozy" if he turns his head too quickly. Orthostatic VS were unimpressive.  Patient ambulated w/PT.  He has been up to bedside chair, ambulated in room, and dangled on the bedside for meals. Will continue to monitor stability for potential discharge home tomorrow.

## 2019-05-18 NOTE — Progress Notes (Signed)
Dr. Aundra Dubin at bedside for assessment.

## 2019-05-18 NOTE — Progress Notes (Signed)
Patient called out complaining of feeling dizzy, like "the room is spinning".  BP 120/86.  Patient also states that he was given anti-anxiety medication at [facility] to combat this feeling.  Patient repositioned in bed and provided water.  Emotional support provided. Patient states feeling is easing off.  Will continue to monitor.

## 2019-05-18 NOTE — Progress Notes (Signed)
Patient called out stating he felt dizzy.  BP 131/83. Then patient stated he felt "short of breath".  Sats 98% on RA, no orthopnea observed by RN.  2L oxygen [place] applied for patient comfort.  Patient then said he felt "woozy", RN clarified he meant nauseated.  PRN dose of IV zofran given.  Will continue to monitor.

## 2019-05-19 ENCOUNTER — Encounter (HOSPITAL_COMMUNITY): Payer: Medicare Other

## 2019-05-19 DIAGNOSIS — R270 Ataxia, unspecified: Secondary | ICD-10-CM

## 2019-05-19 LAB — CBC WITH DIFFERENTIAL/PLATELET
Abs Immature Granulocytes: 0.04 10*3/uL (ref 0.00–0.07)
Basophils Absolute: 0 10*3/uL (ref 0.0–0.1)
Basophils Relative: 0 %
Eosinophils Absolute: 0.2 10*3/uL (ref 0.0–0.5)
Eosinophils Relative: 3 %
HCT: 40.1 % (ref 39.0–52.0)
Hemoglobin: 13.4 g/dL (ref 13.0–17.0)
Immature Granulocytes: 1 %
Lymphocytes Relative: 15 %
Lymphs Abs: 0.9 10*3/uL (ref 0.7–4.0)
MCH: 28.9 pg (ref 26.0–34.0)
MCHC: 33.4 g/dL (ref 30.0–36.0)
MCV: 86.4 fL (ref 80.0–100.0)
Monocytes Absolute: 0.8 10*3/uL (ref 0.1–1.0)
Monocytes Relative: 14 %
Neutro Abs: 4 10*3/uL (ref 1.7–7.7)
Neutrophils Relative %: 67 %
Platelets: 153 10*3/uL (ref 150–400)
RBC: 4.64 MIL/uL (ref 4.22–5.81)
RDW: 13.3 % (ref 11.5–15.5)
WBC: 6 10*3/uL (ref 4.0–10.5)
nRBC: 0 % (ref 0.0–0.2)

## 2019-05-19 LAB — BASIC METABOLIC PANEL
Anion gap: 11 (ref 5–15)
BUN: 26 mg/dL — ABNORMAL HIGH (ref 8–23)
CO2: 25 mmol/L (ref 22–32)
Calcium: 9.1 mg/dL (ref 8.9–10.3)
Chloride: 94 mmol/L — ABNORMAL LOW (ref 98–111)
Creatinine, Ser: 1.48 mg/dL — ABNORMAL HIGH (ref 0.61–1.24)
GFR calc Af Amer: 50 mL/min — ABNORMAL LOW (ref >60)
GFR calc non Af Amer: 43 mL/min — ABNORMAL LOW (ref >60)
Glucose, Bld: 90 mg/dL (ref 70–99)
Potassium: 5.1 mmol/L (ref 3.5–5.1)
Sodium: 130 mmol/L — ABNORMAL LOW (ref 135–145)

## 2019-05-19 LAB — HEMOGLOBIN A1C
Hgb A1c MFr Bld: 6 % — ABNORMAL HIGH (ref 4.8–5.6)
Mean Plasma Glucose: 125.5 mg/dL

## 2019-05-19 MED ORDER — MECLIZINE HCL 25 MG PO TABS
25.0000 mg | ORAL_TABLET | Freq: Three times a day (TID) | ORAL | Status: DC
  Administered 2019-05-19 – 2019-05-23 (×11): 25 mg via ORAL
  Filled 2019-05-19 (×13): qty 1

## 2019-05-19 NOTE — Consult Note (Addendum)
NEUROLOGY CONSULT  Reason for Consult: Neurologic opinion on dizziness  Referring Physician: Dr Haroldine Fernandez  CC: Dizziness and double vision  HPI: Kristopher Fernandez is an 85 y.o. male with PMH of Aortic stenosis, basal cell ca, CAD, angina, SB, CHF, DDD, HTN, HLD, radiculopathy, MI and pacemaker. The pt was admitted to [facility] on 5/28 in transfer from [facility] for acute on chronic HF. He was admitted there on 5/20 with SOB, CP and found to be in acute HF with fluid overload and LV dysfunction. EF dropped from 40% to 20%.   The pt tells me he often feels dizzy when his BP runs low. He has been running 90-110 SBP here. However on Wed 5/27 at some point he tells me he noticed seeing double when he tried looking at the clock. He also has been having associated n/v since 5/27. On exam he has left arm dysmetria, diplopia and current dizzy sensation. This could likely be localized to a left cerebellar lesion/brainstem stroke that is not evident on earlier CT head. Unable to MRI to better evaluate brainstem d/t pacemaker. Unable to CTA d/t CKD3.  Past Medical History Past Medical History:  Diagnosis Date  . Anginal pain ([place])   . Aortic stenosis 07/03/2014   Overview:  Mild with calculated aortic valve area of 1.05cm2  . Basal cell carcinoma, ear 03/31/2015  . Bradycardia 07/03/2014  . CHF (congestive heart failure) ([place])   . DDD (degenerative disc disease), lumbar 08/06/2015  . H/O cardiac catheterization 07/03/2014   Overview:  Cypher stent proximal and distal RCA 07/18/05 and TAXUS stent mid LAD 07/20/05 at [facility]  . HTN (hypertension) 07/03/2014  . Hyperlipidemia 07/03/2014  . Lumbosacral radiculopathy at S1 08/22/2015  . MI (myocardial infarction) ([place]) 07/03/2014   Overview:  Mi 07/17/05  . Myocardial infarction ([place])   . Normocytic normochromic anemia 09/14/2015  . Pacemaker 07/03/2014   Overview:  Dual chamber pacemaker 08/11/11  . Shortness of breath dyspnea   . SOB (shortness of breath) on exertion  05/13/2016    Past Surgical History Past Surgical History:  Procedure Laterality Date  . CARDIAC CATHETERIZATION Bilateral 07/14/2016   Procedure: Right/Left Heart Cath and Coronary Angiography;  Surgeon: Isaias Cowman, MD;  Location: [place] CV LAB;  Service: Cardiovascular;  Laterality: Bilateral;  . CORONARY ANGIOPLASTY    . HERNIA REPAIR    . LEFT HEART CATH AND CORONARY ANGIOGRAPHY N/A 05/09/2019   Procedure: LEFT HEART CATH AND CORONARY ANGIOGRAPHY;  Surgeon: Corey Skains, MD;  Location: [place] [facility];  Service: Cardiovascular;  Laterality: N/A;  . RIGHT HEART CATH N/A 05/17/2019   Procedure: RIGHT HEART CATH;  Surgeon: Larey Dresser, MD;  Location: [place] [facility];  Service: Cardiovascular;  Laterality: N/A;    Family History Family History  Problem Relation Age of Onset  . Bladder Cancer Neg Hx   . Kidney cancer Neg Hx   . Prostate cancer Neg Hx     Social History    reports that he quit smoking about 43 years ago. His smoking use included cigarettes. He quit after 10.00 years of use. He has quit using smokeless tobacco. He reports that he does not drink alcohol or use drugs.  Allergies No Known Allergies  Home Medications Medications Prior to Admission  Medication Sig Dispense Refill  . aspirin EC 81 MG tablet Take 81 mg by mouth daily.    Marland Kitchen atorvastatin (LIPITOR) 80 MG tablet Take 80 mg by mouth daily.    Marland Kitchen  budesonide-formoterol (SYMBICORT) 160-4.5 MCG/ACT inhaler Inhale 2 puffs into the lungs 2 (two) times daily. 1 Inhaler 12  . clopidogrel (PLAVIX) 75 MG tablet Take 75 mg by mouth daily.    . ranolazine (RANEXA) 500 MG 12 hr tablet Take 500 mg by mouth 2 (two) times daily.    Marland Kitchen venlafaxine XR (EFFEXOR-XR) 75 MG 24 hr capsule Take 1 capsule (75 mg total) by mouth daily with breakfast. 30 capsule 0    Hospital Medications . aspirin EC  81 mg Oral Daily  . atorvastatin  80 mg Oral q1800  . carvedilol  3.125 mg Oral BID WC  . clopidogrel   75 mg Oral Daily  . heparin  5,000 Units Subcutaneous Q8H  . losartan  12.5 mg Oral Daily  . meclizine  25 mg Oral Q8H  . mometasone-formoterol  2 puff Inhalation BID  . ranolazine  500 mg Oral BID  . sodium chloride flush  3 mL Intravenous Q12H  . sodium chloride flush  3 mL Intravenous Q12H  . sodium chloride flush  3 mL Intravenous Q12H  . venlafaxine XR  75 mg Oral Q breakfast     ROS: History obtained from pt  General ROS: negative for - chills, fatigue, fever, night sweats. +weight gain or weight loss Psychological ROS: negative for - behavioral disorder, hallucinations, memory difficulties Ophthalmic ROS: negative for -  eye pain or loss of vision; +blurry vision, double vision, Hematological and Lymphatic ROS: negative for - bleeding problems, bruising  Endocrine ROS: negative for -  polydipsia/polyuria or temperature intolerance Respiratory ROS: negative for - + cough, shortness of breath or wheezing Cardiovascular ROS: negative for - +chest pain, dyspnea on exertion, edema or irregular heartbeat Gastrointestinal ROS: negative for - abdominal pain, diarrhea, hematemesis or stool incontinence +nausea/vomiting  Genito-Urinary ROS: negative for - dysuria, hematuria, incontinence or urinary frequency/urgency Musculoskeletal ROS: negative for - joint swelling or muscular weakness Neurological ROS: as noted in HPI Dermatological ROS: negative for rash and skin lesion changes   Physical Examination:  Vitals:   05/19/19 0755 05/19/19 0803 05/19/19 1117 05/19/19 1546  BP:  (!) 103/54 100/68 (!) 94/56  Pulse:  63 68 61  Resp:  16 (!) 23 20  Temp:  97.6 F (36.4 C) 98 F (36.7 C) 98.2 F (36.8 C)  TempSrc:  Oral Oral Oral  SpO2: 91% 92% 94% 94%  Weight:      Height:        General - frail elderly WM. Mild distress Heart - Slowr rate and rhythm w/PVCs Lungs - mild sob while talking noted. Some rales throughotu Abdomen - Soft - non tender Extremities - Distal pulses  intact - no edema Skin - Warm and dry  Neurologic Examination: Mental Status:  Alert, oriented, thought content appropriate. Concern for some slight dysarthria, but pt states his speech sounds normal for his [place] accent. No aphasia. Able to follow 3 step commands without difficulty.  Cranial Nerves:  Bilateral visual fields intact. PERRL, no clear nystagmus with EOMI, but pt tells me it is more blurred with this part of exam, then when asked to look at clock on wall, he says he sees double.  Fast right beating nystagmus when gazing to the right is a correlating finding. No facial numbness and no facial weakness. Hearing normal. Symmetrical palatal movement. midline tongue extension. Shrugging shoulders equal. Motor:Tone and bulk consistent with his age and gen debility. He is gen weak throughout, but no focal deficits. 4/5 throughout.  Sensory:  Intact to light touch in all extremities. Deep Tendon Reflexes: 2/4 throughout Plantars: Downgoing bilaterally  Cerebellar: Left arm is ataxic with FTN, he misses nose and hits his cheek. Normal finger to nose and both  heel to shin bilaterally, but went very slow. Gait: not tested   LABORATORY STUDIES:  Basic Metabolic Panel: Recent Labs  Lab 05/13/19 0721  05/15/19 0403 05/16/19 0507 05/17/19 0335 05/17/19 1336 05/17/19 1434 05/18/19 0255 05/19/19 0304  NA  --    < > 137 134* 135  --  133*  134* 131* 130*  K  --    < > 4.6 4.8 5.0  --  5.2*  5.2* 4.9 5.1  CL  --    < > 97* 97* 99  --   --  96* 94*  CO2  --    < > 33* 30 28  --   --  26 25  GLUCOSE  --    < > 102* 110* 111*  --   --  104* 90  BUN  --    < > 34* 38* 39*  --   --  28* 26*  CREATININE  --    < > 1.44* 1.56* 1.34* 1.43*  --  1.30* 1.48*  CALCIUM  --    < > 8.3* 8.5* 8.6*  --   --  9.0 9.1  MG 2.5*  --   --  2.7* 2.6*  --   --   --   --   PHOS  --   --   --   --  4.3  --   --   --   --    < > = values in this interval not displayed.    Liver Function Tests: No  results for input(s): AST, ALT, ALKPHOS, BILITOT, PROT, ALBUMIN in the last 168 hours. No results for input(s): LIPASE, AMYLASE in the last 168 hours. No results for input(s): AMMONIA in the last 168 hours.  CBC: Recent Labs  Lab 05/16/19 0811 05/17/19 1336 05/17/19 1434 05/18/19 0255 05/19/19 0304  WBC 5.7 6.5  --  5.4 6.0  NEUTROABS  --   --   --  3.5 4.0  HGB 12.6* 13.9 12.9*  12.9* 13.1 13.4  HCT 38.0* 42.5 38.0*  38.0* 39.6 40.1  MCV 87.2 87.4  --  86.1 86.4  PLT 148* 182  --  156 153    Cardiac Enzymes: No results for input(s): CKTOTAL, CKMB, CKMBINDEX, TROPONINI in the last 168 hours.  BNP: Invalid input(s): POCBNP  CBG: No results for input(s): GLUCAP in the last 168 hours.  Microbiology:   Coagulation Studies: No results for input(s): LABPROT, INR in the last 72 hours.  Urinalysis: No results for input(s): COLORURINE, LABSPEC, PHURINE, GLUCOSEU, HGBUR, BILIRUBINUR, KETONESUR, PROTEINUR, UROBILINOGEN, NITRITE, LEUKOCYTESUR in the last 168 hours.  Invalid input(s): APPERANCEUR  Lipid Panel:     Component Value Date/Time   CHOL 104 05/10/2019 0256   TRIG 90 05/10/2019 0256   HDL 44 05/10/2019 0256   CHOLHDL 2.4 05/10/2019 0256   VLDL 18 05/10/2019 0256   LDLCALC 42 05/10/2019 0256    HgbA1C:  No results found for: HGBA1C  Urine Drug Screen:  No results found for: LABOPIA, COCAINSCRNUR, LABBENZ, AMPHETMU, THCU, LABBARB   Alcohol Level:  No results for input(s): ETH in the last 168 hours.  Miscellaneous labs:  EKG  EKG  IMAGING: Dg Chest 2 View  Result Date: 05/18/2019 CLINICAL DATA:  CHF EXAM: CHEST -  2 VIEW COMPARISON:  05/10/2019 FINDINGS: Left subclavian pacemaker device with leads is stable. Small left pleural effusion and basilar subsegmental atelectasis are stable. No pneumothorax. Mid and upper lungs clear. IMPRESSION: Stable basilar atelectasis and small left pleural effusion. Electronically Signed   By: Marybelle Killings M.D.   On:  05/18/2019 07:52     Assessment/Plan: 85yr old man admitted for acute on chronic HF. EF dropped to 20% EF with correlating symptoms of presyncopal sensations and vertigo. He states that the vertigo and presyncope each account for about 50% of his complaint of dizziness. Additionally, on Wed 5/27 at some point he started seeing double when he tried looking at the clock. He also has been having associated n/v since 5/27.  1. On exam he has left arm dysmetria, diplopia, fast right beating nystagmus when gazing to the right, in conjunction with symptom of dizziness (combined vertigo and presyncope). He states that his diplopia started ~ 3d ago.This is localizable to the left cerebellar hemisphere and/or left brainstem. There may be a stroke or strokes in this region that are subacute and not currently visualizable on CT.  2. Unable to MRI d/t pacemaker.  3. Unable to CTA d/t CKD3.  PLAN: - Repeat CT head in 3 days.  - Carotid ultrasound  - Lipid/A1C added to todays labs - Continue meclizine   - tx n/v symptomatically with Zofran. This may also help with his vertigo, as patient states vertigo improved after last Zofran dose.  - Continue ASA and Plavix   Kristopher Fernandez, ARNP-C, ANVP-BC Pager: [phone]   I have seen and examined the patient. I have formulated the assessment and plan with Kristopher Fernandez, ARNP. 85 year old male with possible brainstem or cerebellar stroke 3 days ago. Will need repeat CT head in 3 days to reassess given negative initial CT. Continue meclizine, which patient states has helped with his dizziness, although it has not yet achieved full symptom resolution.  Electronically signed: Dr. Kerney Fernandez

## 2019-05-19 NOTE — Progress Notes (Signed)
Pt OOB in chair throughout AM. Nausea persists. Zofran given. Pt tolerated sips of Ginger Ale and Saltine crackers. Will continue to offer support and interventions.

## 2019-05-19 NOTE — Progress Notes (Signed)
Patient ID: Kristopher Fernandez, male   DOB: 06/05/1932, 85 y.o.   MRN: [id number]     Advanced Heart Failure Rounding Note  PCP-Cardiologist: No primary care provider on file.   Subjective:    Had dizziness and nausea yesterday so d/c held. Felt to have vertigo. Meclizine started. Seen by PT who did not feel he had vestibular symptoms.    Still with nauseated this am - getting zofran. Remains very dizzy and unsteady. Feels room is spinning. Only minimal benefit with meclizine  No CP or SOB.  SBP 100-122. Not frankly orthostatic.    [employer] device interrogation: he has around 5% RV pacing.   [place] Procedural Findings (5/28): Hemodynamics (mmHg) RA mean 3 RV 38/3 PA 38/8, mean 18 PCWP mean 6 Oxygen saturations: PA 68% AO 97% Cardiac Output (Fick) 5.4  Cardiac Index (Fick) 2.68 PVR 2.2 WU   Objective:   Weight Range: 77.2 kg Body mass index is 23.08 kg/m.   Vital Signs:   Temp:  [97.6 F (36.4 C)-98.5 F (36.9 C)] 98 F (36.7 C) (05/30 1117) Pulse Rate:  [61-76] 68 (05/30 1117) Resp:  [15-23] 23 (05/30 1117) BP: (100-128)/(54-84) 100/68 (05/30 1117) SpO2:  [91 %-94 %] 94 % (05/30 1117) Weight:  [77.2 kg] 77.2 kg (05/30 0600) Last BM Date: 05/16/19  Weight change: Filed Weights   05/17/19 1255 05/18/19 0553 05/19/19 0600  Weight: 79.5 kg 78.3 kg 77.2 kg    Intake/Output:   Intake/Output Summary (Last 24 hours) at 05/19/2019 1310 Last data filed at 05/19/2019 1200 Gross per 24 hour  Intake 305 ml  Output 1075 ml  Net -770 ml      Physical Exam    General:  Sitting in chair. Weak appearing. No resp difficulty HEENT: normal Neck: supple. no JVD. Carotids 2+ bilat; no bruits. No lymphadenopathy or thryomegaly appreciated. Cor: PMI nondisplaced. Regular rate & rhythm. 2/6 AS Lungs: clear Abdomen: soft, nontender, nondistended. No hepatosplenomegaly. No bruits or masses. Good bowel sounds. Extremities: no cyanosis, clubbing, rash, edema Neuro: alert &  orientedx3, cranial nerves grossly intact. moves all 4 extremities w/o difficulty. Severe tremor cannot do FTN or other cerebellar tests well    Telemetry   NSR 60-70 alternating with occasional V-pacing (personally reviewed)  Labs    CBC Recent Labs    05/18/19 0255 05/19/19 0304  WBC 5.4 6.0  NEUTROABS 3.5 4.0  HGB 13.1 13.4  HCT 39.6 40.1  MCV 86.1 86.4  PLT 156 053   Basic Metabolic Panel Recent Labs    05/17/19 0335  05/18/19 0255 05/19/19 0304  NA 135   < > 131* 130*  K 5.0   < > 4.9 5.1  CL 99  --  96* 94*  CO2 28  --  26 25  GLUCOSE 111*  --  104* 90  BUN 39*  --  28* 26*  CREATININE 1.34*   < > 1.30* 1.48*  CALCIUM 8.6*  --  9.0 9.1  MG 2.6*  --   --   --   PHOS 4.3  --   --   --    < > = values in this interval not displayed.   Liver Function Tests No results for input(s): AST, ALT, ALKPHOS, BILITOT, PROT, ALBUMIN in the last 72 hours. No results for input(s): LIPASE, AMYLASE in the last 72 hours. Cardiac Enzymes No results for input(s): CKTOTAL, CKMB, CKMBINDEX, TROPONINI in the last 72 hours.  BNP: BNP (last 3 results) Recent Labs  05/09/19 1034  BNP 1,462.0*    ProBNP (last 3 results) No results for input(s): PROBNP in the last 8760 hours.   D-Dimer No results for input(s): DDIMER in the last 72 hours. Hemoglobin A1C No results for input(s): HGBA1C in the last 72 hours. Fasting Lipid Panel No results for input(s): CHOL, HDL, LDLCALC, TRIG, CHOLHDL, LDLDIRECT in the last 72 hours. Thyroid Function Tests No results for input(s): TSH, T4TOTAL, T3FREE, THYROIDAB in the last 72 hours.  Invalid input(s): FREET3  Other results:   Imaging    No results found.   Medications:     Scheduled Medications: . aspirin EC  81 mg Oral Daily  . atorvastatin  80 mg Oral q1800  . carvedilol  3.125 mg Oral BID WC  . clopidogrel  75 mg Oral Daily  . heparin  5,000 Units Subcutaneous Q8H  . losartan  12.5 mg Oral Daily  .  mometasone-formoterol  2 puff Inhalation BID  . ranolazine  500 mg Oral BID  . sodium chloride flush  3 mL Intravenous Q12H  . sodium chloride flush  3 mL Intravenous Q12H  . sodium chloride flush  3 mL Intravenous Q12H  . venlafaxine XR  75 mg Oral Q breakfast    Infusions: . sodium chloride    . sodium chloride      PRN Medications: sodium chloride, sodium chloride, acetaminophen, meclizine, ondansetron (ZOFRAN) IV, sodium chloride flush, sodium chloride flush   Assessment/Plan   1. Acute on chronic systolic CHF: Ischemic cardiomyopathy.  Echo in 1/20 with EF 40-45%, but now down to 20-25% on echo from 5/20.  Patient was initially admitted at [facility] and transferred here due to concern for low output HF/need for inotropes. He was diuresed at [facility].  The cause of the fall in EF is uncertain, he had coronary angiogram at [facility] with stable anatomy (no intervention).  He paces his RV only 5% of the time (device interrogated).  Currently, SBP in 110s-120s. RHC on 5/28 showed low filling pressures and good cardiac output.  The low BP at [facility], I think, was related to diuresis + Entresto use.  We have started him on low doses of Coreg and losartan and BP has tolerated so far. He remains euvolemic on exam today, we have not given him any diuretic.   - Continue Coreg 3.125 mg bid and losartan 12.5 mg daily.   - No diuretics yet as he looks euvolemic if not a bit dry.  May need Lasix 20 mg daily at home.   2. CAD: Admitted with chest pain and mild troponin elevation (no trend).  Suspect that the mild troponin elevation was demand ischemia from CHF.  He has now been diuresed.  Cath showed stable anatomy, no intervention.  - No s/s ischemia - Continue ASA 81, Plavix 75.  - Continue statin - Continue ranolazine.  3. CKD: Stage 3.  Creatinine stable at 1.5 this am 4. Aortic stenosis: Mild to moderate on echo this admission. 5. Nausea/vomiting/dizziness: Developed while in bed yesterday morning and has had  episodes over the last 3 wks.    - seems like vertigo to me but PT has seen and does not feel he has vestibular symptoms.  - brain CT last week at [facility] negative - will schedule meclizine - Will ask Neurology to see   Length of Stay: 2  Glori Bickers, MD  05/19/2019, 1:10 PM  Advanced Heart Failure Team Pager [phone] (M-F; 7a - 4p)  Please contact [facility] for  night-coverage after hours (4p -7a ) and weekends on [email or web address]

## 2019-05-19 NOTE — Progress Notes (Signed)
Checked with pt again. Still dizzy, feeling poorly. Encouraged pt to walk with staff when he feels able.  [place], ACSM 1:59 PM 05/19/2019

## 2019-05-19 NOTE — Progress Notes (Signed)
Came to ambulate pt however he is "swimmiheaded". Feels nauseated in recliner, sts he staggered to bathroom earlier. Notified RN, she will probably give zophran. I will f/u later. Yves Dill CES, ACSM 9:49 AM 05/19/2019

## 2019-05-20 ENCOUNTER — Inpatient Hospital Stay (HOSPITAL_COMMUNITY): Payer: Medicare Other

## 2019-05-20 DIAGNOSIS — G459 Transient cerebral ischemic attack, unspecified: Secondary | ICD-10-CM

## 2019-05-20 DIAGNOSIS — I639 Cerebral infarction, unspecified: Secondary | ICD-10-CM

## 2019-05-20 DIAGNOSIS — I6340 Cerebral infarction due to embolism of unspecified cerebral artery: Secondary | ICD-10-CM

## 2019-05-20 DIAGNOSIS — I5021 Acute systolic (congestive) heart failure: Secondary | ICD-10-CM

## 2019-05-20 LAB — CBC WITH DIFFERENTIAL/PLATELET
Abs Immature Granulocytes: 0.02 10*3/uL (ref 0.00–0.07)
Basophils Absolute: 0 10*3/uL (ref 0.0–0.1)
Basophils Relative: 0 %
Eosinophils Absolute: 0.1 10*3/uL (ref 0.0–0.5)
Eosinophils Relative: 2 %
HCT: 40.6 % (ref 39.0–52.0)
Hemoglobin: 13.5 g/dL (ref 13.0–17.0)
Immature Granulocytes: 0 %
Lymphocytes Relative: 10 %
Lymphs Abs: 0.6 10*3/uL — ABNORMAL LOW (ref 0.7–4.0)
MCH: 28.7 pg (ref 26.0–34.0)
MCHC: 33.3 g/dL (ref 30.0–36.0)
MCV: 86.2 fL (ref 80.0–100.0)
Monocytes Absolute: 0.6 10*3/uL (ref 0.1–1.0)
Monocytes Relative: 10 %
Neutro Abs: 4.5 10*3/uL (ref 1.7–7.7)
Neutrophils Relative %: 78 %
Platelets: 151 10*3/uL (ref 150–400)
RBC: 4.71 MIL/uL (ref 4.22–5.81)
RDW: 13.2 % (ref 11.5–15.5)
WBC: 5.8 10*3/uL (ref 4.0–10.5)
nRBC: 0 % (ref 0.0–0.2)

## 2019-05-20 LAB — BASIC METABOLIC PANEL
Anion gap: 11 (ref 5–15)
BUN: 25 mg/dL — ABNORMAL HIGH (ref 8–23)
CO2: 27 mmol/L (ref 22–32)
Calcium: 9.1 mg/dL (ref 8.9–10.3)
Chloride: 93 mmol/L — ABNORMAL LOW (ref 98–111)
Creatinine, Ser: 1.65 mg/dL — ABNORMAL HIGH (ref 0.61–1.24)
GFR calc Af Amer: 44 mL/min — ABNORMAL LOW (ref >60)
GFR calc non Af Amer: 38 mL/min — ABNORMAL LOW (ref >60)
Glucose, Bld: 136 mg/dL — ABNORMAL HIGH (ref 70–99)
Potassium: 5.1 mmol/L (ref 3.5–5.1)
Sodium: 131 mmol/L — ABNORMAL LOW (ref 135–145)

## 2019-05-20 LAB — LIPID PANEL
Cholesterol: 151 mg/dL (ref 0–200)
HDL: 58 mg/dL (ref >40)
LDL Cholesterol: 75 mg/dL (ref 0–99)
Total CHOL/HDL Ratio: 2.6 RATIO
Triglycerides: 92 mg/dL (ref <150)
VLDL: 18 mg/dL (ref 0–40)

## 2019-05-20 MED ORDER — SODIUM CHLORIDE 0.9 % IV BOLUS
500.0000 mL | Freq: Once | INTRAVENOUS | Status: AC
  Administered 2019-05-20: 500 mL via INTRAVENOUS

## 2019-05-20 NOTE — Progress Notes (Addendum)
Patient ID: Kristopher Fernandez, male   DOB: 08/06/1932, 85 y.o.   MRN: [id number]     Advanced Heart Failure Rounding Note  PCP-Cardiologist: No primary care provider on file.   Subjective:    Seen by Neuro on 5/30 and felt to have cerebellar stroke. Recommending repeat CT in several days and conservative management. Carotid u/s 1-39% bilaterally. Vertebrals ok.   Neuro exam much improved. Nausea better. Denies SOB, orthopnea, PND. He is concerned about low BP. (was in mid 90s this am. Now low 100s).   [employer] device interrogation: he has around 5% RV pacing.   [place] Procedural Findings (5/28): Hemodynamics (mmHg) RA mean 3 RV 38/3 PA 38/8, mean 18 PCWP mean 6 Oxygen saturations: PA 68% AO 97% Cardiac Output (Fick) 5.4  Cardiac Index (Fick) 2.68 PVR 2.2 WU   Objective:   Weight Range: 79 kg Body mass index is 23.62 kg/m.   Vital Signs:   Temp:  [97.5 F (36.4 C)-98.2 F (36.8 C)] 97.7 F (36.5 C) (05/31 1138) Pulse Rate:  [61-77] 69 (05/31 1138) Resp:  [14-20] 18 (05/31 1138) BP: (85-124)/(56-83) 103/65 (05/31 1138) SpO2:  [89 %-96 %] 95 % (05/31 1138) Weight:  [79 kg] 79 kg (05/31 0455) Last BM Date: 05/19/19  Weight change: Filed Weights   05/18/19 0553 05/19/19 0600 05/20/19 0455  Weight: 78.3 kg 77.2 kg 79 kg    Intake/Output:   Intake/Output Summary (Last 24 hours) at 05/20/2019 1151 Last data filed at 05/20/2019 1012 Gross per 24 hour  Intake 780 ml  Output 1075 ml  Net -295 ml      Physical Exam    General:  Sitting up in bed. Well appearing. No resp difficulty HEENT: normal Neck: supple. no JVD. Carotids 2+ bilat; + bruits. No lymphadenopathy or thryomegaly appreciated. Cor: PMI nondisplaced. Regular rate & rhythm. 2/^ AS Lungs: clear Abdomen: soft, nontender, nondistended. No hepatosplenomegaly. No bruits or masses. Good bowel sounds. Extremities: no cyanosis, clubbing, rash, edema Neuro: alert & orientedx3, cranial nerves grossly intact.  Cerebellar exam much improved with ability to do FTN now moves all 4 extremities w/o difficulty. Affect pleasant    Telemetry   NSR 60-70s occasional V-pacing (personally reviewed)  Labs    CBC Recent Labs    05/19/19 0304 05/20/19 0827  WBC 6.0 5.8  NEUTROABS 4.0 4.5  HGB 13.4 13.5  HCT 40.1 40.6  MCV 86.4 86.2  PLT 153 546   Basic Metabolic Panel Recent Labs    05/19/19 0304 05/20/19 0827  NA 130* 131*  K 5.1 5.1  CL 94* 93*  CO2 25 27  GLUCOSE 90 136*  BUN 26* 25*  CREATININE 1.48* 1.65*  CALCIUM 9.1 9.1   Liver Function Tests No results for input(s): AST, ALT, ALKPHOS, BILITOT, PROT, ALBUMIN in the last 72 hours. No results for input(s): LIPASE, AMYLASE in the last 72 hours. Cardiac Enzymes No results for input(s): CKTOTAL, CKMB, CKMBINDEX, TROPONINI in the last 72 hours.  BNP: BNP (last 3 results) Recent Labs    05/09/19 1034  BNP 1,462.0*    ProBNP (last 3 results) No results for input(s): PROBNP in the last 8760 hours.   D-Dimer No results for input(s): DDIMER in the last 72 hours. Hemoglobin A1C Recent Labs    05/19/19 0304  HGBA1C 6.0*   Fasting Lipid Panel Recent Labs    05/19/19 0304  CHOL 151  HDL 58  LDLCALC 75  TRIG 92  CHOLHDL 2.6   Thyroid Function Tests  No results for input(s): TSH, T4TOTAL, T3FREE, THYROIDAB in the last 72 hours.  Invalid input(s): FREET3  Other results:   Imaging    Vas US Carotid  Result Date: 05/20/2019 Carotid Arterial Duplex Study Indications:       CVA. Risk Factors:      Hypertension, hyperlipidemia, coronary artery disease. Other Factors:     Pacemaker. Comparison Study:  Prior study from 06/29/13 is available for comparison. Performing Technologist: Sharion Dove RVS  Examination Guidelines: A complete evaluation includes B-mode imaging, spectral Doppler, color Doppler, and power Doppler as needed of all accessible portions of each vessel. Bilateral testing is considered an integral part  of a complete examination. Limited examinations for reoccurring indications may be performed as noted.  Right Carotid Findings: +----------+--------+--------+--------+------------+------------------+           PSV cm/sEDV cm/sStenosisDescribe    Comments           +----------+--------+--------+--------+------------+------------------+ CCA Prox  63      13                          intimal thickening +----------+--------+--------+--------+------------+------------------+ CCA Distal69      16                          intimal thickening +----------+--------+--------+--------+------------+------------------+ ICA Prox  66      21              heterogenous                   +----------+--------+--------+--------+------------+------------------+ ICA Distal96      31                                             +----------+--------+--------+--------+------------+------------------+ ECA       62      4                                              +----------+--------+--------+--------+------------+------------------+ +----------+--------+-------+--------+-------------------+           PSV cm/sEDV cmsDescribeArm Pressure (mmHG) +----------+--------+-------+--------+-------------------+ [id number]                                         +----------+--------+-------+--------+-------------------+ +---------+--------+--+--------+--+ VertebralPSV cm/s83EDV cm/s14 +---------+--------+--+--------+--+  Left Carotid Findings: +----------+--------+--------+--------+------------+------------------+           PSV cm/sEDV cm/sStenosisDescribe    Comments           +----------+--------+--------+--------+------------+------------------+ CCA Prox  79      10                          intimal thickening +----------+--------+--------+--------+------------+------------------+ CCA Distal57      12                          intimal thickening  +----------+--------+--------+--------+------------+------------------+ ICA Prox  132     30              heterogenous                   +----------+--------+--------+--------+------------+------------------+  ICA Distal89      17                                             +----------+--------+--------+--------+------------+------------------+ ECA       101     2                                              +----------+--------+--------+--------+------------+------------------+ +----------+--------+--------+--------+-------------------+ SubclavianPSV cm/sEDV cm/sDescribeArm Pressure (mmHG) +----------+--------+--------+--------+-------------------+           83                                          +----------+--------+--------+--------+-------------------+ +---------+--------+--+--------+--+ VertebralPSV cm/s40EDV cm/s13 +---------+--------+--+--------+--+  Summary: Right Carotid: Velocities in the right ICA are consistent with a 1-39% stenosis. Left Carotid: Velocities in the left ICA are consistent with a 1-39% stenosis. Vertebrals:  Bilateral vertebral arteries demonstrate antegrade flow. Subclavians: Normal flow hemodynamics were seen in bilateral subclavian              arteries. *See table(s) above for measurements and observations.     Preliminary      Medications:     Scheduled Medications: . aspirin EC  81 mg Oral Daily  . atorvastatin  80 mg Oral q1800  . carvedilol  3.125 mg Oral BID WC  . clopidogrel  75 mg Oral Daily  . heparin  5,000 Units Subcutaneous Q8H  . losartan  12.5 mg Oral Daily  . meclizine  25 mg Oral Q8H  . mometasone-formoterol  2 puff Inhalation BID  . ranolazine  500 mg Oral BID  . sodium chloride flush  3 mL Intravenous Q12H  . venlafaxine XR  75 mg Oral Q breakfast    Infusions: . sodium chloride      PRN Medications: sodium chloride, acetaminophen, ondansetron (ZOFRAN) IV, sodium chloride flush   Assessment/Plan    1. Acute on chronic systolic CHF: Ischemic cardiomyopathy.  Echo in 1/20 with EF 40-45%, but now down to 20-25% on echo from 5/20.  Patient was initially admitted at [facility] and transferred here due to concern for low output HF/need for inotropes. He was diuresed at [facility].  The cause of the fall in EF is uncertain, he had coronary angiogram at [facility] with stable anatomy (no intervention).  He paces his RV only 5% of the time (device interrogated).  Currently, SBP in 110s-120s. RHC on 5/28 showed low filling pressures and good cardiac output.  The low BP at [facility], I think, was related to diuresis + Entresto use.  We have started him on low doses of Coreg and losartan and BP has tolerated so far. He remains euvolemic on exam today, we have not given him any diuretic.   - Continue Coreg 3.125 mg bid and losartan 12.5 mg daily.   - Volume status looks good. Will hold diuretics at least one more day with soft BP.   2. CAD: Admitted with chest pain and mild troponin elevation (no trend).  Suspect that the mild troponin elevation was demand ischemia from CHF.  He has now been diuresed.  Cath showed stable anatomy, no intervention.  - No s/s ischemia -  Continue ASA 81, Plavix 75.  - Continue statin - Continue ranolazine.  3. CKD: Stage 3.  Creatinine stable at 1.6 this am 4. Aortic stenosis: Mild to moderate on echo this admission.Will follow  5. Nausea/vomiting/dizziness:  - Seen by Neuro on 5/30 and felt to have cerebellar stroke. Recommending repeat CT in several days and conservative management. Carotid u/s 1-39% bilaterally. Vertebrals ok.  - Exam much improved today - If creatinine 1.5 or better in am consider CTA brain - d/w Neurology at bedside   Length of Stay: [place], MD  05/20/2019, 11:51 AM  Advanced Heart Failure Team Pager [phone] (M-F; 7a - 4p)  Please contact [facility] for night-coverage after hours (4p -7a ) and weekends on [email or web address]  Addendum:  Patient  orthostatic. Will give 500cc NS and recheck. If still orthostatic may need midodrine.  Glori Bickers, MD  5:08 PM

## 2019-05-20 NOTE — Progress Notes (Signed)
Orthostatic static vital signs   109/67, HR 74 lying  95/50, HR 80  Sitting  75/38, HR 81 standing  99/66, HR 62 after standing x3 minutes

## 2019-05-20 NOTE — Progress Notes (Signed)
STROKE TEAM PROGRESS NOTE   SUBJECTIVE (INTERVAL HISTORY) His RN and Dr. Haroldine Laws are at the bedside.  Pt currently asymptomatic. Denies any dizziness or double vision, but stated that Kristopher Fernandez was nauseous earlier and received zofran and Kristopher Fernandez now felt much better. Dr. Haroldine Laws also confirmed that Kristopher Fernandez is much improved from yesterday as yesterday Kristopher Fernandez had left FTN ataxia.   On detailed H&P, Kristopher Fernandez said for the last 2-3 weeks, Kristopher Fernandez has orthopnea in the morning, and when Kristopher Fernandez sits up or stands up Kristopher Fernandez will have dizziness, disequilibrium. Kristopher Fernandez sees things moving around, not sure if real double vision. As days go by, Kristopher Fernandez feels better and better. For the last 2-3 days in hospital, Kristopher Fernandez feels improved.    OBJECTIVE Vitals:   05/20/19 0434 05/20/19 0452 05/20/19 0455 05/20/19 0710  BP: 100/83 (!) 85/66  124/63  Pulse: 67 66  72  Resp:    19  Temp:  (!) 97.5 F (36.4 C)  97.6 F (36.4 C)  TempSrc:  Oral  Oral  SpO2: (!) 89%   94%  Weight:   79 kg   Height:        CBC:  Recent Labs  Lab 05/19/19 0304 05/20/19 0827  WBC 6.0 5.8  NEUTROABS 4.0 4.5  HGB 13.4 13.5  HCT 40.1 40.6  MCV 86.4 86.2  PLT 153 062    Basic Metabolic Panel:  Recent Labs  Lab 05/16/19 0507 05/17/19 0335  05/19/19 0304 05/20/19 0827  NA 134* 135   < > 130* 131*  K 4.8 5.0   < > 5.1 5.1  CL 97* 99   < > 94* 93*  CO2 30 28   < > 25 27  GLUCOSE 110* 111*   < > 90 136*  BUN 38* 39*   < > 26* 25*  CREATININE 1.56* 1.34*   < > 1.48* 1.65*  CALCIUM 8.5* 8.6*   < > 9.1 9.1  MG 2.7* 2.6*  --   --   --   PHOS  --  4.3  --   --   --    < > = values in this interval not displayed.    Lipid Panel:     Component Value Date/Time   CHOL 151 05/19/2019 0304   TRIG 92 05/19/2019 0304   HDL 58 05/19/2019 0304   CHOLHDL 2.6 05/19/2019 0304   VLDL 18 05/19/2019 0304   LDLCALC 75 05/19/2019 0304   HgbA1c:  Lab Results  Component Value Date   HGBA1C 6.0 (H) 05/19/2019   Urine Drug Screen: No results found for: LABOPIA, COCAINSCRNUR,  LABBENZ, AMPHETMU, THCU, LABBARB  Alcohol Level No results found for: [facility]  IMAGING  CT Head Wo Contrast 05/16/2019 IMPRESSION: Atrophy, chronic microvascular disease. No acute intracranial abnormality.   CT Head Wo Contrast - pending 05/20/2019   Transthoracic Echocardiogram  05/10/2019 IMPRESSIONS  1. The left ventricle has severely reduced systolic function, with an ejection fraction of 20-25%. The cavity size was normal. Left ventricular diastolic Doppler parameters are indeterminate.  2. The right ventricle has normal systolic function. The cavity was normal. There is no increase in right ventricular wall thickness.  3. The mitral valve is grossly normal. Mitral valve regurgitation is mild to moderate by color flow Doppler.  4. Tricuspid valve regurgitation is mild-moderate.  5. The aortic valve is tricuspid. Aortic valve regurgitation is mild to moderate by color flow Doppler. Moderate stenosis of the aortic valve.    Bilateral Carotid Dopplers  05/20/2019 Right Carotid: Velocities in the right ICA are consistent with a 1-39% stenosis. Left Carotid: Velocities in the left ICA are consistent with a 1-39% stenosis. Vertebrals:  Bilateral vertebral arteries demonstrate antegrade flow. Subclavians: Normal flow hemodynamics were seen in bilateral subclavian              arteries.  EKG - paced - rate 71 BPM. (See cardiology reading for complete details)    PHYSICAL EXAM  Temp:  [97.5 F (36.4 C)-98.2 F (36.8 C)] 97.7 F (36.5 C) (05/31 1138) Pulse Rate:  [61-77] 69 (05/31 1138) Resp:  [14-20] 18 (05/31 1138) BP: (85-124)/(56-83) 103/65 (05/31 1138) SpO2:  [89 %-96 %] 95 % (05/31 1138) Weight:  [79 kg] 79 kg (05/31 0455)  General - Well nourished, well developed, in no apparent distress.  Ophthalmologic - fundi not visualized due to noncooperation.  Cardiovascular - Regular rate and rhythm.  Mental Status -  Level of arousal and orientation to time, place, and  person were intact. Language including expression, naming, repetition, comprehension was assessed and found intact. Fund of Knowledge was assessed and was intact.  Cranial Nerves II - XII - II - Visual field intact OU. III, IV, VI - Extraocular movements intact. V - Facial sensation intact bilaterally. VII - Facial movement intact bilaterally. VIII - Hearing & vestibular intact bilaterally, no nystagmus  X - Palate elevates symmetrically. XI - Chin turning & shoulder shrug intact bilaterally. XII - Tongue protrusion intact.  Motor Strength - The patient's strength was normal in all extremities and pronator drift was absent.  Bulk was normal and fasciculations were absent.   Motor Tone - Muscle tone was assessed at the neck and appendages and was normal.  Reflexes - The patient's reflexes were symmetrical in all extremities and Kristopher Fernandez had no pathological reflexes.  Sensory - Light touch, temperature/pinprick were assessed and were symmetrical.    Coordination - The patient had normal movements in the hands and feet with no ataxia or dysmetria.  Tremor was absent.  Gait and Station - deferred.    ASSESSMENT/PLAN Kristopher Fernandez is a 85 y.o. male with history of aortic stenosis, basal cell ca, CKD, CAD, angina, SB, CHF, DDD, HTN, HLD, radiculopathy, MI and pacemaker admitted to [facility] 5/20 with SOB, CP and found to be in acute HF with fluid overload and LV dysfunction. EF dropped from 40% to 20%. On 5/27 the pt developed left arm dysmetria, diplopia, n/v and a dizzy sensation. Kristopher Fernandez was admitted to [facility] 5/28 for heart cath. Dr Cheral Marker consulted on the pt 05/19/19 for diplopia and dizziness.  Kristopher Fernandez did not receive IV t-PA due to late presentation (>4.5 hours from time of onset).    Brainstem/cerebellar small stroke vs. TIA vs. Pre-syncope (orthostatic vs. Cardiogenic hypotension)  Resultant  Dizziness improved, ? Diplopia and ataxia - now resolved  CT head - no acute abnormality,  b/l caudate head lacunes  Repeat CT Head - pending  MRI head - not able to perform due to ppm  Recommend CTA H&N if creatinine improves to rule out posterior circulation atherosclerosis  Carotid Doppler unremarkable  2D Echo - EF 20 - 25%. No definite cardiac source of emboli identified.   Lacey Jensen Virus 2 neg 05/09/19  LDL - 75  HgbA1c - 6.0  VTE prophylaxis - [place] Heparin  Diet  - Heart healthy with thin liquids.  aspirin 81 mg daily and clopidogrel 75 mg daily prior to admission, now on aspirin 81  mg daily and clopidogrel 75 mg daily. May need to consider [facility] given low EF.   Patient counseled to be compliant with his antithrombotic medications  Ongoing aggressive stroke risk factor management  Therapy recommendations:  pending  Disposition:  Pending  ?? Orthostatic hypotension  Pt description concerning for orthostatic hypotension  5/29 BP lying 94/83, sitting 88/65, standing 98/80  Will repeat orthostatic vital today  ?? Hypotension related to CHF  BP tends to run low  On coreg, losartan  HF team on board . Long-term BP goal normotensive  Cardiomyopathy  12/27/18 EF 40%  05/01/19 EF 20%  Right Heart Cath 5/28 - low filling pressures and good cardiac output.  On coreg, and losartan  Likely etiology for low BP  BP goal normotensive if able   Hyperlipidemia  Lipid lowering medication PTA: Lipitor 80 mg daily  LDL 75, goal < 70  Current lipid lowering medication: Lipitor 80 mg daily  Continue statin at discharge  Other Stroke Risk Factors  Advanced age  Former cigarette smoker - quit 38 years ago  Coronary artery disease / MI   Other Active Problems  CKD - creatinine - 1.56->1.34->1.48->1.65  Hyponatremia - 130->131  PPM - interogated by cardiology this admission  Hospital day # 3  I spent  35 minutes in total face-to-face time with the patient, more than 50% of which was spent in counseling and coordination of care, reviewing  test results, images and medication, and discussing the diagnosis of stroke vs. TIA vs. Presyncope, hypotension, cardiomyopathy, treatment plan and potential prognosis. This patient's care requiresreview of multiple databases, neurological assessment, discussion with family, other specialists and medical decision making of high complexity.  Rosalin Hawking, MD PhD Stroke Neurology 05/20/2019 12:37 PM   To contact Stroke Continuity provider, please refer to [email or web address] After hours, contact General Neurology

## 2019-05-20 NOTE — Plan of Care (Signed)
  Problem: Education: Goal: Ability to demonstrate management of disease process will improve Outcome: Progressing Goal: Ability to verbalize understanding of medication therapies will improve Outcome: Progressing   Problem: Activity: Goal: Capacity to carry out activities will improve Outcome: Progressing   Problem: Cardiac: Goal: Ability to achieve and maintain adequate cardiopulmonary perfusion will improve Outcome: Progressing   Problem: Health Behavior/Discharge Planning: Goal: Ability to manage health-related needs will improve Outcome: Progressing   Problem: Clinical Measurements: Goal: Ability to maintain clinical measurements within normal limits will improve Outcome: Progressing Goal: Diagnostic test results will improve Outcome: Progressing Goal: Respiratory complications will improve Outcome: Progressing Goal: Cardiovascular complication will be avoided Outcome: Progressing   Problem: Coping: Goal: Level of anxiety will decrease Outcome: Progressing   Problem: Safety: Goal: Ability to remain free from injury will improve Outcome: Progressing   Problem: Skin Integrity: Goal: Risk for impaired skin integrity will decrease Outcome: Progressing

## 2019-05-20 NOTE — Progress Notes (Signed)
VASCULAR LAB PRELIMINARY  PRELIMINARY  PRELIMINARY  PRELIMINARY  Carotid duplex completed.    Preliminary report:  See CV proc for preliminary results.   Miraya Cudney, RVT 05/20/2019, 10:15 AM

## 2019-05-21 ENCOUNTER — Inpatient Hospital Stay (HOSPITAL_COMMUNITY): Payer: Medicare Other

## 2019-05-21 DIAGNOSIS — E871 Hypo-osmolality and hyponatremia: Secondary | ICD-10-CM

## 2019-05-21 DIAGNOSIS — R42 Dizziness and giddiness: Secondary | ICD-10-CM

## 2019-05-21 DIAGNOSIS — R55 Syncope and collapse: Secondary | ICD-10-CM

## 2019-05-21 DIAGNOSIS — I951 Orthostatic hypotension: Secondary | ICD-10-CM

## 2019-05-21 DIAGNOSIS — N183 Chronic kidney disease, stage 3 (moderate): Secondary | ICD-10-CM

## 2019-05-21 DIAGNOSIS — G459 Transient cerebral ischemic attack, unspecified: Secondary | ICD-10-CM

## 2019-05-21 LAB — CBC WITH DIFFERENTIAL/PLATELET
Abs Immature Granulocytes: 0.02 10*3/uL (ref 0.00–0.07)
Basophils Absolute: 0 10*3/uL (ref 0.0–0.1)
Basophils Relative: 0 %
Eosinophils Absolute: 0.1 10*3/uL (ref 0.0–0.5)
Eosinophils Relative: 3 %
HCT: 36.7 % — ABNORMAL LOW (ref 39.0–52.0)
Hemoglobin: 12.3 g/dL — ABNORMAL LOW (ref 13.0–17.0)
Immature Granulocytes: 0 %
Lymphocytes Relative: 14 %
Lymphs Abs: 0.7 10*3/uL (ref 0.7–4.0)
MCH: 28.7 pg (ref 26.0–34.0)
MCHC: 33.5 g/dL (ref 30.0–36.0)
MCV: 85.5 fL (ref 80.0–100.0)
Monocytes Absolute: 0.7 10*3/uL (ref 0.1–1.0)
Monocytes Relative: 15 %
Neutro Abs: 3.3 10*3/uL (ref 1.7–7.7)
Neutrophils Relative %: 68 %
Platelets: 128 10*3/uL — ABNORMAL LOW (ref 150–400)
RBC: 4.29 MIL/uL (ref 4.22–5.81)
RDW: 13.2 % (ref 11.5–15.5)
WBC: 4.9 10*3/uL (ref 4.0–10.5)
nRBC: 0 % (ref 0.0–0.2)

## 2019-05-21 LAB — BASIC METABOLIC PANEL
Anion gap: 9 (ref 5–15)
BUN: 25 mg/dL — ABNORMAL HIGH (ref 8–23)
CO2: 24 mmol/L (ref 22–32)
Calcium: 8.7 mg/dL — ABNORMAL LOW (ref 8.9–10.3)
Chloride: 98 mmol/L (ref 98–111)
Creatinine, Ser: 1.56 mg/dL — ABNORMAL HIGH (ref 0.61–1.24)
GFR calc Af Amer: 47 mL/min — ABNORMAL LOW (ref >60)
GFR calc non Af Amer: 40 mL/min — ABNORMAL LOW (ref >60)
Glucose, Bld: 89 mg/dL (ref 70–99)
Potassium: 4.9 mmol/L (ref 3.5–5.1)
Sodium: 131 mmol/L — ABNORMAL LOW (ref 135–145)

## 2019-05-21 MED ORDER — LOSARTAN POTASSIUM 25 MG PO TABS
12.5000 mg | ORAL_TABLET | Freq: Every day | ORAL | Status: DC
  Administered 2019-05-22: 12.5 mg via ORAL
  Filled 2019-05-21: qty 1

## 2019-05-21 NOTE — Progress Notes (Signed)
STROKE TEAM PROGRESS NOTE   SUBJECTIVE (INTERVAL HISTORY) No family at bedside.  Patient sitting in chair.  No complaints.  Has CT repeat this morning showed no acute abnormality.  Checked orthostatic vital yesterday showed orthostatic hypotension.  Creatinine improved from yesterday, will do CT head and neck.   OBJECTIVE Vitals:   05/21/19 0813 05/21/19 0900 05/21/19 1114 05/21/19 1602  BP:  113/62 (!) 106/58 134/74  Pulse:  79  71  Resp:  16  (!) 22  Temp:   97.8 F (36.6 C) 97.9 F (36.6 C)  TempSrc:   Oral Oral  SpO2: 96% 95%  97%  Weight:      Height:        CBC:  Recent Labs  Lab 05/20/19 0827 05/21/19 0416  WBC 5.8 4.9  NEUTROABS 4.5 3.3  HGB 13.5 12.3*  HCT 40.6 36.7*  MCV 86.2 85.5  PLT 151 128*    Basic Metabolic Panel:  Recent Labs  Lab 05/16/19 0507 05/17/19 0335  05/20/19 0827 05/21/19 0416  NA 134* 135   < > 131* 131*  K 4.8 5.0   < > 5.1 4.9  CL 97* 99   < > 93* 98  CO2 30 28   < > 27 24  GLUCOSE 110* 111*   < > 136* 89  BUN 38* 39*   < > 25* 25*  CREATININE 1.56* 1.34*   < > 1.65* 1.56*  CALCIUM 8.5* 8.6*   < > 9.1 8.7*  MG 2.7* 2.6*  --   --   --   PHOS  --  4.3  --   --   --    < > = values in this interval not displayed.    Lipid Panel:     Component Value Date/Time   CHOL 151 05/19/2019 0304   TRIG 92 05/19/2019 0304   HDL 58 05/19/2019 0304   CHOLHDL 2.6 05/19/2019 0304   VLDL 18 05/19/2019 0304   LDLCALC 75 05/19/2019 0304   HgbA1c:  Lab Results  Component Value Date   HGBA1C 6.0 (H) 05/19/2019   Urine Drug Screen: No results found for: LABOPIA, COCAINSCRNUR, LABBENZ, AMPHETMU, THCU, LABBARB  Alcohol Level No results found for: [facility]  IMAGING  CT Head Wo Contrast 05/16/2019 IMPRESSION: Atrophy, chronic microvascular disease. No acute intracranial abnormality.  Ct Head Wo Contrast  Result Date: 05/21/2019 CLINICAL DATA:  85 year old male with diplopia, dizziness. EXAM: CT HEAD WITHOUT CONTRAST TECHNIQUE: Contiguous  axial images were obtained from the base of the skull through the vertex without intravenous contrast. COMPARISON:  05/16/2019. FINDINGS: Brain: Bilateral basal ganglia and patchy periventricular white matter small vessel disease appears stable. Stable gray-white matter differentiation throughout the brain. No midline shift, ventriculomegaly, mass effect, evidence of mass lesion, intracranial hemorrhage or evidence of cortically based acute infarction. Vascular: Calcified atherosclerosis at the skull base. No suspicious intracranial vascular hyperdensity. Skull: Negative. Sinuses/Orbits: Paranasal sinuses tympanic cavities and mastoids are clear. Other: Negative orbits. Visualized scalp soft tissues are within normal limits. IMPRESSION: Cerebral white matter and deep gray matter small vessel disease appears stable since 05/16/2019. No acute intracranial abnormality identified. Electronically Signed   By: Genevie Ann M.D.   On: 05/21/2019 05:29   Transthoracic Echocardiogram  05/10/2019 IMPRESSIONS  1. The left ventricle has severely reduced systolic function, with an ejection fraction of 20-25%. The cavity size was normal. Left ventricular diastolic Doppler parameters are indeterminate.  2. The right ventricle has normal systolic function. The cavity was  normal. There is no increase in right ventricular wall thickness.  3. The mitral valve is grossly normal. Mitral valve regurgitation is mild to moderate by color flow Doppler.  4. Tricuspid valve regurgitation is mild-moderate.  5. The aortic valve is tricuspid. Aortic valve regurgitation is mild to moderate by color flow Doppler. Moderate stenosis of the aortic valve.   Bilateral Carotid Dopplers  05/20/2019 Right Carotid: Velocities in the right ICA are consistent with a 1-39% stenosis. Left Carotid: Velocities in the left ICA are consistent with a 1-39% stenosis. Vertebrals:  Bilateral vertebral arteries demonstrate antegrade flow. Subclavians: Normal  flow hemodynamics were seen in bilateral subclavian              arteries.  EKG - paced - rate 71 BPM. (See cardiology reading for complete details)    PHYSICAL EXAM  Temp:  [97.2 F (36.2 C)-98.3 F (36.8 C)] 97.9 F (36.6 C) (06/01 1602) Pulse Rate:  [63-87] 71 (06/01 1602) Resp:  [16-22] 22 (06/01 1602) BP: (83-134)/(53-82) 134/74 (06/01 1602) SpO2:  [95 %-99 %] 97 % (06/01 1602) Weight:  [78 kg] 78 kg (06/01 0405)  General - Well nourished, well developed, in no apparent distress.  Ophthalmologic - fundi not visualized due to noncooperation.  Cardiovascular - Regular rate and rhythm.  Mental Status -  Level of arousal and orientation to time, place, and person were intact. Language including expression, naming, repetition, comprehension was assessed and found intact. Fund of Knowledge was assessed and was intact.  Cranial Nerves II - XII - II - Visual field intact OU. III, IV, VI - Extraocular movements intact. V - Facial sensation intact bilaterally. VII - Facial movement intact bilaterally. VIII - Hearing & vestibular intact bilaterally, no nystagmus  X - Palate elevates symmetrically. XI - Chin turning & shoulder shrug intact bilaterally. XII - Tongue protrusion intact.  Motor Strength - The patient's strength was normal in all extremities and pronator drift was absent.  Bulk was normal and fasciculations were absent.   Motor Tone - Muscle tone was assessed at the neck and appendages and was normal.  Reflexes - The patient's reflexes were symmetrical in all extremities and he had no pathological reflexes.  Sensory - Light touch, temperature/pinprick were assessed and were symmetrical.    Coordination - The patient had normal movements in the hands and feet with no ataxia or dysmetria.  Tremor was absent.  Gait and Station - deferred.    ASSESSMENT/PLAN Kristopher Fernandez is a 85 y.o. male with history of aortic stenosis, basal cell ca, CKD, CAD, angina, SB,  CHF, DDD, HTN, HLD, radiculopathy, MI and pacemaker admitted to [facility] on 5/20 with SOB, CP and found to be in acute HF with fluid overload and LV dysfunction. EF dropped from 40% to 20%. On 5/27 the pt developed left arm dysmetria, diplopia, n/v and a dizzy sensation. He was admitted to [facility] 5/28 for heart cath. Dr Cheral Marker consulted on the pt 05/19/19 for diplopia and dizziness.  He did not receive IV t-PA due to late presentation (>4.5 hours from time of onset).   Brainstem/cerebellar small stroke vs. TIA vs. orthostatic hypotension  Resultant  Dizziness improved, ? Diplopia and ataxia - now resolved  CT head - no acute abnormality, b/l caudate head lacunes  Repeat CT Head - no acute abnormality  MRI head - not able to perform due to ppm  CTA H&N pending  Carotid Doppler unremarkable  2D Echo - EF 20 - 25%.  No definite cardiac source of emboli identified.   Lacey Jensen Virus 2 neg 05/09/19  LDL - 75  HgbA1c - 6.0  VTE prophylaxis - [place] Heparin  Diet  - Heart healthy with thin liquids.  aspirin 81 mg daily and clopidogrel 75 mg daily prior to admission, now on aspirin 81 mg daily and clopidogrel 75 mg daily. May need to consider [facility] given low EF if CTA head and neck unrevealing.   Patient counseled to be compliant with his antithrombotic medications  Ongoing aggressive stroke risk factor management  Therapy recommendations:  pending  Disposition:  Pending  Orthostatic hypotension  Pt description concerning for orthostatic hypotension  5/29 BP lying 94/83, sitting 88/65, standing 98/80  5/31 BP lying 117/66, sitting 95/58, standing 83/53 and 97/53  Put on TED hose  Hypotension related to CHF, improved  BP improved  On coreg, losartan  HF team on board . Long-term BP goal normotensive  Cardiomyopathy  12/27/18 EF 40%  05/01/19 EF 20%  Right Heart Cath 5/28 - low filling pressures and good cardiac output.  On coreg, and losartan  Likely etiology for  low BP  BP goal normotensive if able   Hyperlipidemia  Lipid lowering medication PTA: Lipitor 80 mg daily  LDL 75, goal < 70  Current lipid lowering medication: Lipitor 80 mg daily  Continue statin at discharge  Other Stroke Risk Factors  Advanced age  Former cigarette smoker - quit 38 years ago  Coronary artery disease / MI   Other Active Problems  CKD - creatinine - 1.56->1.34->1.48->1.65-> 1.56  Hyponatremia - 130->131-> 131   PPM - interogated by cardiology this admission - no mentioning of A. fib at this time  Hospital day # 4   Rosalin Hawking, MD PhD Stroke Neurology 05/21/2019 5:04 PM   To contact Stroke Continuity provider, please refer to [email or web address] After hours, contact General Neurology

## 2019-05-21 NOTE — Progress Notes (Signed)
CARDIAC REHAB PHASE I   PRE:  Rate/Rhythm: 68 pacing    BP: sitting 94/80    SaO2: 95 RA  MODE:  Ambulation: 230 ft   POST:  Rate/Rhythm: 88 pacing    BP: sitting 113/62     SaO2: 92 RA  Pt eager to ambulate. He is feeling better this am. Stated slight dizziness when he stood but did not hinder him. Able to ambulate with RW, which he does have at home. Used assist x2 for safety. C/o leg weakness with distance. To recliner. VSS. He did demonstrate slight confusion at times, which he corrected. He thought it was night for a brief moment. He sts his daughter checks on him and his neighbors at home. Would encourage 24 hour supervision at d/c. Will f/u. [phone]   [place], ACSM 05/21/2019 9:04 AM

## 2019-05-21 NOTE — Progress Notes (Signed)
Patient ID: Kristopher Fernandez, male   DOB: [birth year], 85 y.o.   MRN: [id number]     Advanced Heart Failure Rounding Note  PCP-Cardiologist: No primary care provider on file.   Subjective:    Seen by Neuro on 5/30 and felt to have cerebellar stroke. Recommending repeat CT in several days and conservative management. Carotid u/s 1-39% bilaterally. Vertebrals ok.   Continues to feel much better. Orthostatics checked yesterday and dropped about 30 points. Given 500cc NS. Still slightly dizzy when standing. No dizziness at rest. No further nausea. No CP, SOB, orthopnea or PND  [employer] device interrogation: he has around 5% RV pacing.   [place] Procedural Findings (5/28): Hemodynamics (mmHg) RA mean 3 RV 38/3 PA 38/8, mean 18 PCWP mean 6 Oxygen saturations: PA 68% AO 97% Cardiac Output (Fick) 5.4  Cardiac Index (Fick) 2.68 PVR 2.2 WU   Objective:   Weight Range: 78 kg Body mass index is 23.32 kg/m.   Vital Signs:   Temp:  [97.2 F (36.2 C)-98.3 F (36.8 C)] 97.8 F (36.6 C) (06/01 1114) Pulse Rate:  [62-87] 79 (06/01 0900) Resp:  [16-20] 16 (06/01 0900) BP: (75-132)/(38-82) 106/58 (06/01 1114) SpO2:  [93 %-99 %] 95 % (06/01 0900) Weight:  [78 kg] 78 kg (06/01 0405) Last BM Date: 05/19/19  Weight change: Filed Weights   05/19/19 0600 05/20/19 0455 05/21/19 0405  Weight: 77.2 kg 79 kg 78 kg    Intake/Output:   Intake/Output Summary (Last 24 hours) at 05/21/2019 1413 Last data filed at 05/21/2019 1213 Gross per 24 hour  Intake 1253 ml  Output 1925 ml  Net -672 ml      Physical Exam    General:  Well appearing. No resp difficulty HEENT: normal Neck: supple. no JVD. Carotids 2+ bilat; no bruits. No lymphadenopathy or thryomegaly appreciated. Cor: PMI nondisplaced. Regular rate & rhythm. 2/6 AS Lungs: clear Abdomen: soft, nontender, nondistended. No hepatosplenomegaly. No bruits or masses. Good bowel sounds. Extremities: no cyanosis, clubbing, rash, edema Neuro: alert  & orientedx3, cranial nerves grossly intact. moves all 4 extremities w/o difficulty. Affect pleasant   Telemetry   NSR 70s occasional V-pacing (personally reviewed)  Labs    CBC Recent Labs    05/20/19 0827 05/21/19 0416  WBC 5.8 4.9  NEUTROABS 4.5 3.3  HGB 13.5 12.3*  HCT 40.6 36.7*  MCV 86.2 85.5  PLT 151 342*   Basic Metabolic Panel Recent Labs    05/20/19 0827 05/21/19 0416  NA 131* 131*  K 5.1 4.9  CL 93* 98  CO2 27 24  GLUCOSE 136* 89  BUN 25* 25*  CREATININE 1.65* 1.56*  CALCIUM 9.1 8.7*   Liver Function Tests No results for input(s): AST, ALT, ALKPHOS, BILITOT, PROT, ALBUMIN in the last 72 hours. No results for input(s): LIPASE, AMYLASE in the last 72 hours. Cardiac Enzymes No results for input(s): CKTOTAL, CKMB, CKMBINDEX, TROPONINI in the last 72 hours.  BNP: BNP (last 3 results) Recent Labs    05/09/19 1034  BNP 1,462.0*    ProBNP (last 3 results) No results for input(s): PROBNP in the last 8760 hours.   D-Dimer No results for input(s): DDIMER in the last 72 hours. Hemoglobin A1C Recent Labs    05/19/19 0304  HGBA1C 6.0*   Fasting Lipid Panel Recent Labs    05/19/19 0304  CHOL 151  HDL 58  LDLCALC 75  TRIG 92  CHOLHDL 2.6   Thyroid Function Tests No results for input(s): TSH, T4TOTAL, T3FREE,  THYROIDAB in the last 72 hours.  Invalid input(s): FREET3  Other results:   Imaging    Ct Head Wo Contrast  Result Date: 05/21/2019 CLINICAL DATA:  85 year old male with diplopia, dizziness. EXAM: CT HEAD WITHOUT CONTRAST TECHNIQUE: Contiguous axial images were obtained from the base of the skull through the vertex without intravenous contrast. COMPARISON:  05/16/2019. FINDINGS: Brain: Bilateral basal ganglia and patchy periventricular white matter small vessel disease appears stable. Stable gray-white matter differentiation throughout the brain. No midline shift, ventriculomegaly, mass effect, evidence of mass lesion, intracranial  hemorrhage or evidence of cortically based acute infarction. Vascular: Calcified atherosclerosis at the skull base. No suspicious intracranial vascular hyperdensity. Skull: Negative. Sinuses/Orbits: Paranasal sinuses tympanic cavities and mastoids are clear. Other: Negative orbits. Visualized scalp soft tissues are within normal limits. IMPRESSION: Cerebral white matter and deep gray matter small vessel disease appears stable since 05/16/2019. No acute intracranial abnormality identified. Electronically Signed   By: Genevie Ann M.D.   On: 05/21/2019 05:29     Medications:     Scheduled Medications:  aspirin EC  81 mg Oral Daily   atorvastatin  80 mg Oral q1800   carvedilol  3.125 mg Oral BID WC   clopidogrel  75 mg Oral Daily   heparin  5,000 Units Subcutaneous Q8H   losartan  12.5 mg Oral Daily   meclizine  25 mg Oral Q8H   mometasone-formoterol  2 puff Inhalation BID   ranolazine  500 mg Oral BID   sodium chloride flush  3 mL Intravenous Q12H   venlafaxine XR  75 mg Oral Q breakfast    Infusions:  sodium chloride      PRN Medications: sodium chloride, acetaminophen, ondansetron (ZOFRAN) IV, sodium chloride flush   Assessment/Plan   1. Acute on chronic systolic CHF: Ischemic cardiomyopathy.  Echo in 1/20 with EF 40-45%, but now down to 20-25% on echo from 5/20.  Patient was initially admitted at [facility] and transferred here due to concern for low output HF/need for inotropes. He was diuresed at [facility].  The cause of the fall in EF is uncertain, he had coronary angiogram at [facility] with stable anatomy (no intervention).  He paces his RV only 5% of the time (device interrogated).  Currently, SBP in 110s-120s. RHC on 5/28 showed low filling pressures and good cardiac output.  The low BP at [facility], I think, was related to diuresis + Entresto use.  We have started him on low doses of Coreg and losartan and BP has tolerated so far. He remains euvolemic on exam today, we have not given him any  diuretic.   - Continue Coreg 3.125 mg bid and losartan 12.5 mg daily.   - Volume status looks good. Was orthostatic yesterday and got 500cc NS. Continue to hold diuretics 2. CAD: Admitted with chest pain and mild troponin elevation (no trend).  Suspect that the mild troponin elevation was demand ischemia from CHF.  He has now been diuresed.  Cath showed stable anatomy, no intervention.  - No s/s ischemia - Continue ASA 81, Plavix 75.  - Continue statin - Continue ranolazine.  3. CKD: Stage 3.  Creatinine stable at 1.56 this am 4. Aortic stenosis: Mild to moderate on echo this admission.Will follow  5. Nausea/vomiting/dizziness:  - Seen by Neuro on 5/30 and felt to have cerebellar stroke. Recommending repeat CT in several days and conservative management. Carotid u/s 1-39% bilaterally. Vertebrals ok.  - Exam much improved today - If creatinine 1.5 or better in  am consider CTA brain. If has vertebrobasilar disease will need [facility] - d/w Neurology personally  6. Orthostatic hypotension - improved with IVF.  - continue to follow may need midodrine.  - Consider w/u for TTR at some point 7. Deconditioning - consult PT   Length of Stay: 4  Glori Bickers, MD  05/21/2019, 2:13 PM  Advanced Heart Failure Team Pager [phone] (M-F; 7a - 4p)  Please contact [facility] for night-coverage after hours (4p -7a ) and weekends on [email or web address]

## 2019-05-22 ENCOUNTER — Encounter (HOSPITAL_COMMUNITY): Payer: Self-pay | Admitting: Radiology

## 2019-05-22 ENCOUNTER — Inpatient Hospital Stay (HOSPITAL_COMMUNITY): Payer: Medicare Other

## 2019-05-22 DIAGNOSIS — I255 Ischemic cardiomyopathy: Secondary | ICD-10-CM

## 2019-05-22 DIAGNOSIS — I6310 Cerebral infarction due to embolism of unspecified precerebral artery: Secondary | ICD-10-CM

## 2019-05-22 DIAGNOSIS — E785 Hyperlipidemia, unspecified: Secondary | ICD-10-CM

## 2019-05-22 LAB — CBC WITH DIFFERENTIAL/PLATELET
Abs Immature Granulocytes: 0.03 10*3/uL (ref 0.00–0.07)
Basophils Absolute: 0 10*3/uL (ref 0.0–0.1)
Basophils Relative: 0 %
Eosinophils Absolute: 0.1 10*3/uL (ref 0.0–0.5)
Eosinophils Relative: 3 %
HCT: 36.7 % — ABNORMAL LOW (ref 39.0–52.0)
Hemoglobin: 12.4 g/dL — ABNORMAL LOW (ref 13.0–17.0)
Immature Granulocytes: 1 %
Lymphocytes Relative: 16 %
Lymphs Abs: 0.6 10*3/uL — ABNORMAL LOW (ref 0.7–4.0)
MCH: 28.7 pg (ref 26.0–34.0)
MCHC: 33.8 g/dL (ref 30.0–36.0)
MCV: 85 fL (ref 80.0–100.0)
Monocytes Absolute: 0.6 10*3/uL (ref 0.1–1.0)
Monocytes Relative: 14 %
Neutro Abs: 2.7 10*3/uL (ref 1.7–7.7)
Neutrophils Relative %: 66 %
Platelets: 137 10*3/uL — ABNORMAL LOW (ref 150–400)
RBC: 4.32 MIL/uL (ref 4.22–5.81)
RDW: 13.2 % (ref 11.5–15.5)
WBC: 4.1 10*3/uL (ref 4.0–10.5)
nRBC: 0 % (ref 0.0–0.2)

## 2019-05-22 LAB — BASIC METABOLIC PANEL
Anion gap: 10 (ref 5–15)
BUN: 24 mg/dL — ABNORMAL HIGH (ref 8–23)
CO2: 25 mmol/L (ref 22–32)
Calcium: 9 mg/dL (ref 8.9–10.3)
Chloride: 96 mmol/L — ABNORMAL LOW (ref 98–111)
Creatinine, Ser: 1.47 mg/dL — ABNORMAL HIGH (ref 0.61–1.24)
GFR calc Af Amer: 50 mL/min — ABNORMAL LOW (ref >60)
GFR calc non Af Amer: 43 mL/min — ABNORMAL LOW (ref >60)
Glucose, Bld: 85 mg/dL (ref 70–99)
Potassium: 4.7 mmol/L (ref 3.5–5.1)
Sodium: 131 mmol/L — ABNORMAL LOW (ref 135–145)

## 2019-05-22 MED ORDER — MAGNESIUM HYDROXIDE 400 MG/5ML PO SUSP
30.0000 mL | Freq: Every day | ORAL | Status: DC | PRN
  Administered 2019-05-22: 30 mL via ORAL
  Filled 2019-05-22: qty 30

## 2019-05-22 MED ORDER — IOHEXOL 350 MG/ML SOLN
75.0000 mL | Freq: Once | INTRAVENOUS | Status: AC | PRN
  Administered 2019-05-22: 02:00:00 75 mL via INTRAVENOUS

## 2019-05-22 MED ORDER — APIXABAN 5 MG PO TABS
5.0000 mg | ORAL_TABLET | Freq: Two times a day (BID) | ORAL | Status: DC
  Administered 2019-05-22 – 2019-05-23 (×3): 5 mg via ORAL
  Filled 2019-05-22 (×3): qty 1

## 2019-05-22 NOTE — Evaluation (Signed)
Physical Therapy Evaluation Patient Details Name: Kristopher Fernandez MRN: [id number] DOB: [birth year] Today's Date: 05/22/2019   History of Present Illness  Patient is an 85 y/o male presenting with SOB likely due to acute on chronic systolic CHF. PMH significant for MI, pacer HTN, DDD, CHF. S/p R heart cath on 05/17/2019. on 5/30 patient developed double vision and dizziness. CT head - no acute abnormality. Continued work-up.     Clinical Impression  Patient admitted with the above listed diagnosis. PT re-consulted due to new onset of dizziness and double vision. Assessment of orthostatic vitals prior to initiating mobility with nursing present in room (see below). Patient mobilizing with RW in hallway for >200 feet without LOB or overt instability at general min guard to supervision level assist for safety - no physical assist needed. VSS throughout session on RA with no subjective complaints of dizziness or double vision. Will recommend HHPT at discharge to ensure safety in the home environment. PT to follow acutely to maximize safe and independent functional mobility.    BP sitting: 110/67 BP standing: 102/58 BP standing 3 min: 101/66   Follow Up Recommendations Home health PT;Supervision for mobility/OOB    Equipment Recommendations  None recommended by PT    Recommendations for Other Services       Precautions / Restrictions Precautions Precautions: Fall Precaution Comments: watch BP - has been orthostatic Restrictions Weight Bearing Restrictions: No      Mobility  Bed Mobility               General bed mobility comments: up in recliner  Transfers Overall transfer level: Needs assistance Equipment used: None Transfers: Sit to/from Stand Sit to Stand: Min guard;Supervision         General transfer comment: for safety; assessing dizziness/orthostatics  Ambulation/Gait Ambulation/Gait assistance: Min Gaffer (Feet): 200 Feet Assistive device:  Rolling walker (2 wheeled) Gait Pattern/deviations: Step-through pattern;Decreased stride length;Trunk flexed Gait velocity: decreased   General Gait Details: patient preference to use RW as he states he feels more stable; no LOB or overt instability; in standing able to rise on toes and perform high knee marches without LOB. VSS on RA  Stairs            Wheelchair Mobility    Modified Rankin (Stroke Patients Only)       Balance Overall balance assessment: Mild deficits observed, not formally tested Sitting-balance support: No upper extremity supported;Feet supported Sitting balance-Leahy Scale: Good     Standing balance support: Bilateral upper extremity supported;During functional activity Standing balance-Leahy Scale: Fair                               Pertinent Vitals/Pain Pain Assessment: No/denies pain    Home Living Family/patient expects to be discharged to:: Private residence Living Arrangements: Children Available Help at Discharge: Family;Friend(s) Type of Home: House Home Access: Stairs to enter Entrance Stairs-Rails: Can reach both Entrance Stairs-Number of Steps: 2 Home Layout: One level Home Equipment: Walker - 2 wheels;Cane - single point;Bedside commode;Shower seat;Grab bars - toilet;Grab bars - tub/shower;Wheelchair - manual      Prior Function Level of Independence: Needs assistance   Gait / Transfers Assistance Needed: Ambulates with spc, drives. No falls reported in the last 12 months  ADL's / Homemaking Assistance Needed: Independent with ADLs, assist from dtr with IADLs        Hand Dominance   Dominant Hand: Right  Extremity/Trunk Assessment   Upper Extremity Assessment Upper Extremity Assessment: Overall WFL for tasks assessed    Lower Extremity Assessment Lower Extremity Assessment: Generalized weakness    Cervical / Trunk Assessment Cervical / Trunk Assessment: Normal  Communication   Communication: No  difficulties  Cognition Arousal/Alertness: Awake/alert Behavior During Therapy: WFL for tasks assessed/performed Overall Cognitive Status: Within Functional Limits for tasks assessed                                        General Comments General comments (skin integrity, edema, etc.): reports only mild dizziness with initial standing that resolves with time; no dizziness or double vision with mobility    Exercises     Assessment/Plan    PT Assessment Patient needs continued PT services  PT Problem List Decreased strength;Decreased activity tolerance;Decreased balance;Decreased mobility;Decreased knowledge of use of DME;Decreased safety awareness       PT Treatment Interventions DME instruction;Gait training;Stair training;Functional mobility training;Therapeutic activities;Therapeutic exercise;Balance training;Patient/family education    PT Goals (Current goals can be found in the Care Plan section)  Acute Rehab PT Goals Patient Stated Goal: return home soon PT Goal Formulation: With patient Time For Goal Achievement: 06/05/19 Potential to Achieve Goals: Good    Frequency Min 3X/week   Barriers to discharge        Co-evaluation               AM-PAC PT "6 Clicks" Mobility  Outcome Measure Help needed turning from your back to your side while in a flat bed without using bedrails?: None Help needed moving from lying on your back to sitting on the side of a flat bed without using bedrails?: None Help needed moving to and from a bed to a chair (including a wheelchair)?: A Little Help needed standing up from a chair using your arms (e.g., wheelchair or bedside chair)?: A Little Help needed to walk in hospital room?: A Little Help needed climbing 3-5 steps with a railing? : A Little 6 Click Score: 20    End of Session Equipment Utilized During Treatment: Gait belt Activity Tolerance: Patient tolerated treatment well Patient left: in chair;with call  bell/phone within reach Nurse Communication: Mobility status PT Visit Diagnosis: Unsteadiness on feet (R26.81);Other abnormalities of gait and mobility (R26.89);Muscle weakness (generalized) (M62.81)    Time: 1638-4665 PT Time Calculation (min) (ACUTE ONLY): 28 min   Charges:   PT Evaluation $PT Eval Moderate Complexity: 1 Mod PT Treatments $Gait Training: 8-22 mins         Lanney Gins, PT, DPT Supplemental Physical Therapist 05/22/19 10:00 AM Pager: [phone] Office: [phone]

## 2019-05-22 NOTE — Progress Notes (Signed)
Patient ID: Kristopher Fernandez, male   DOB: 1932/07/31, 85 y.o.   MRN: [id number]     Advanced Heart Failure Rounding Note  PCP-Cardiologist: No primary care provider on file.   Subjective:    Seen by Neuro on 5/30 and felt to have cerebellar stroke. Repeat CT head no change. Carotid u/s 1-39% bilaterally. Vertebrals ok. CTA head/neck without large vessel occlusion or hemodynamically significant stenosis.   Mild dizziness this morning.  No orthostatics yet today.  No dyspnea/chest pain.   [employer] device interrogation: he has around 5% RV pacing.   [place] Procedural Findings (5/28): Hemodynamics (mmHg) RA mean 3 RV 38/3 PA 38/8, mean 18 PCWP mean 6 Oxygen saturations: PA 68% AO 97% Cardiac Output (Fick) 5.4  Cardiac Index (Fick) 2.68 PVR 2.2 WU   Objective:   Weight Range: 76.6 kg Body mass index is 22.9 kg/m.   Vital Signs:   Temp:  [97.5 F (36.4 C)-98.3 F (36.8 C)] 97.8 F (36.6 C) (06/02 0712) Pulse Rate:  [64-79] 64 (06/02 0300) Resp:  [14-22] 14 (06/02 0300) BP: (97-134)/(58-82) 110/67 (06/02 0712) SpO2:  [94 %-97 %] 94 % (06/02 0300) Weight:  [76.6 kg] 76.6 kg (06/02 0500) Last BM Date: 05/19/19  Weight change: Filed Weights   05/20/19 0455 05/21/19 0405 05/22/19 0500  Weight: 79 kg 78 kg 76.6 kg    Intake/Output:   Intake/Output Summary (Last 24 hours) at 05/22/2019 0727 Last data filed at 05/22/2019 0610 Gross per 24 hour  Intake 1075 ml  Output 1475 ml  Net -400 ml      Physical Exam    General: NAD Neck: No JVD, no thyromegaly or thyroid nodule.  Lungs: Clear to auscultation bilaterally with normal respiratory effort. CV: Nondisplaced PMI.  Heart regular S1/S2, no S3/S4, 2/6 SEM RUSB.  No peripheral edema.   Abdomen: Soft, nontender, no hepatosplenomegaly, no distention.  Skin: Intact without lesions or rashes.  Neurologic: Alert and oriented x 3.  Psych: Normal affect. Extremities: No clubbing or cyanosis.  HEENT: Normal.    Telemetry    NSR 70s occasional a-pacing, no atrial fibrillation (personally reviewed)  Labs    CBC Recent Labs    05/21/19 0416 05/22/19 0302  WBC 4.9 4.1  NEUTROABS 3.3 2.7  HGB 12.3* 12.4*  HCT 36.7* 36.7*  MCV 85.5 85.0  PLT 128* 409*   Basic Metabolic Panel Recent Labs    05/21/19 0416 05/22/19 0302  NA 131* 131*  K 4.9 4.7  CL 98 96*  CO2 24 25  GLUCOSE 89 85  BUN 25* 24*  CREATININE 1.56* 1.47*  CALCIUM 8.7* 9.0   Liver Function Tests No results for input(s): AST, ALT, ALKPHOS, BILITOT, PROT, ALBUMIN in the last 72 hours. No results for input(s): LIPASE, AMYLASE in the last 72 hours. Cardiac Enzymes No results for input(s): CKTOTAL, CKMB, CKMBINDEX, TROPONINI in the last 72 hours.  BNP: BNP (last 3 results) Recent Labs    05/09/19 1034  BNP 1,462.0*    ProBNP (last 3 results) No results for input(s): PROBNP in the last 8760 hours.   D-Dimer No results for input(s): DDIMER in the last 72 hours. Hemoglobin A1C No results for input(s): HGBA1C in the last 72 hours. Fasting Lipid Panel No results for input(s): CHOL, HDL, LDLCALC, TRIG, CHOLHDL, LDLDIRECT in the last 72 hours. Thyroid Function Tests No results for input(s): TSH, T4TOTAL, T3FREE, THYROIDAB in the last 72 hours.  Invalid input(s): FREET3  Other results:   Imaging  Ct Angio Head W Or Wo Contrast  Result Date: 05/22/2019 CLINICAL DATA:  TIA EXAM: CT ANGIOGRAPHY HEAD AND NECK TECHNIQUE: Multidetector CT imaging of the head and neck was performed using the standard protocol during bolus administration of intravenous contrast. Multiplanar CT image reconstructions and MIPs were obtained to evaluate the vascular anatomy. Carotid stenosis measurements (when applicable) are obtained utilizing NASCET criteria, using the distal internal carotid diameter as the denominator. CONTRAST:  79mL OMNIPAQUE IOHEXOL 350 MG/ML SOLN COMPARISON:  Head CT 05/21/2019 FINDINGS: CT HEAD FINDINGS Brain: There is no mass,  hemorrhage or extra-axial collection. The size and configuration of the ventricles and extra-axial CSF spaces are normal. There are multiple old infarcts of the deep gray nuclei and periventricular white matter. There is hypoattenuation of the periventricular white matter, most commonly indicating chronic ischemic microangiopathy. Old left cerebellar infarct. Skull: The visualized skull base, calvarium and extracranial soft tissues are normal. Sinuses/Orbits: No fluid levels or advanced mucosal thickening of the visualized paranasal sinuses. No mastoid or middle ear effusion. The orbits are normal. CTA NECK FINDINGS SKELETON: There is no bony spinal canal stenosis. No lytic or blastic lesion. OTHER NECK: Normal pharynx, larynx and major salivary glands. No cervical lymphadenopathy. Unremarkable thyroid gland. UPPER CHEST: No pneumothorax or pleural effusion. No nodules or masses. AORTIC ARCH: There is no visualized calcific atherosclerosis of the aortic arch. There is no aneurysm, dissection or hemodynamically significant stenosis of the visualized ascending aorta and aortic arch. The visualized proximal subclavian arteries are widely patent. RIGHT CAROTID SYSTEM: --Common carotid artery: Widely patent origin without common carotid artery dissection or aneurysm. Mild atherosclerotic calcification at the carotid bifurcation without hemodynamically significant stenosis. --Internal carotid artery: No dissection, occlusion or aneurysm. Mild atherosclerotic calcification at the carotid bifurcation without hemodynamically significant stenosis. --External carotid artery: No acute abnormality. LEFT CAROTID SYSTEM: --Common carotid artery: Widely patent origin without common carotid artery dissection or aneurysm. Mild atherosclerotic calcification at the carotid bifurcation. --Internal carotid artery: No dissection, occlusion or aneurysm. There is mixed density atherosclerosis extending into the proximal ICA, resulting in  less than 50% stenosis. --External carotid artery: No acute abnormality. VERTEBRAL ARTERIES: Codominant configuration. Both origins are clearly patent. No dissection, occlusion or flow-limiting stenosis to the skull base (V1-V3 segments). CTA HEAD FINDINGS POSTERIOR CIRCULATION: --Vertebral arteries: Normal V4 segments. --Posterior inferior cerebellar arteries (PICA): Patent origins from the vertebral arteries. --Anterior inferior cerebellar arteries (AICA): Patent origins from the basilar artery. --Basilar artery: Normal. --Superior cerebellar arteries: Normal. --Posterior cerebral arteries (PCA): Normal. There are bilateral posterior communicating arteries (p-comm) that partially supply the PCAs. ANTERIOR CIRCULATION: --Intracranial internal carotid arteries: Atherosclerotic calcification of the internal carotid arteries at the skull base without hemodynamically significant stenosis. --Anterior cerebral arteries (ACA): Normal. Absent left A1 segment, normal variant --Middle cerebral arteries (MCA): Normal. VENOUS SINUSES: As permitted by contrast timing, patent. ANATOMIC VARIANTS: None Review of the MIP images confirms the above findings. IMPRESSION: 1. No emergent large vessel occlusion or hemodynamically significant stenosis. 2. Bilateral atherosclerotic disease at the carotid bifurcations with less than 50% stenosis. 3. 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 Electronically Signed   By: Ulyses Jarred M.D.   On: 05/22/2019 03:05   Ct Angio Neck W Or Wo Contrast  Result Date: 05/22/2019 CLINICAL DATA:  TIA EXAM: CT ANGIOGRAPHY HEAD AND NECK TECHNIQUE: Multidetector CT imaging of the head and neck was performed using the standard protocol during bolus administration of intravenous contrast. Multiplanar CT image reconstructions and MIPs were obtained  to evaluate the vascular anatomy. Carotid stenosis measurements (when applicable) are obtained utilizing NASCET criteria, using the  distal internal carotid diameter as the denominator. CONTRAST:  55mL OMNIPAQUE IOHEXOL 350 MG/ML SOLN COMPARISON:  Head CT 05/21/2019 FINDINGS: CT HEAD FINDINGS Brain: There is no mass, hemorrhage or extra-axial collection. The size and configuration of the ventricles and extra-axial CSF spaces are normal. There are multiple old infarcts of the deep gray nuclei and periventricular white matter. There is hypoattenuation of the periventricular white matter, most commonly indicating chronic ischemic microangiopathy. Old left cerebellar infarct. Skull: The visualized skull base, calvarium and extracranial soft tissues are normal. Sinuses/Orbits: No fluid levels or advanced mucosal thickening of the visualized paranasal sinuses. No mastoid or middle ear effusion. The orbits are normal. CTA NECK FINDINGS SKELETON: There is no bony spinal canal stenosis. No lytic or blastic lesion. OTHER NECK: Normal pharynx, larynx and major salivary glands. No cervical lymphadenopathy. Unremarkable thyroid gland. UPPER CHEST: No pneumothorax or pleural effusion. No nodules or masses. AORTIC ARCH: There is no visualized calcific atherosclerosis of the aortic arch. There is no aneurysm, dissection or hemodynamically significant stenosis of the visualized ascending aorta and aortic arch. The visualized proximal subclavian arteries are widely patent. RIGHT CAROTID SYSTEM: --Common carotid artery: Widely patent origin without common carotid artery dissection or aneurysm. Mild atherosclerotic calcification at the carotid bifurcation without hemodynamically significant stenosis. --Internal carotid artery: No dissection, occlusion or aneurysm. Mild atherosclerotic calcification at the carotid bifurcation without hemodynamically significant stenosis. --External carotid artery: No acute abnormality. LEFT CAROTID SYSTEM: --Common carotid artery: Widely patent origin without common carotid artery dissection or aneurysm. Mild atherosclerotic  calcification at the carotid bifurcation. --Internal carotid artery: No dissection, occlusion or aneurysm. There is mixed density atherosclerosis extending into the proximal ICA, resulting in less than 50% stenosis. --External carotid artery: No acute abnormality. VERTEBRAL ARTERIES: Codominant configuration. Both origins are clearly patent. No dissection, occlusion or flow-limiting stenosis to the skull base (V1-V3 segments). CTA HEAD FINDINGS POSTERIOR CIRCULATION: --Vertebral arteries: Normal V4 segments. --Posterior inferior cerebellar arteries (PICA): Patent origins from the vertebral arteries. --Anterior inferior cerebellar arteries (AICA): Patent origins from the basilar artery. --Basilar artery: Normal. --Superior cerebellar arteries: Normal. --Posterior cerebral arteries (PCA): Normal. There are bilateral posterior communicating arteries (p-comm) that partially supply the PCAs. ANTERIOR CIRCULATION: --Intracranial internal carotid arteries: Atherosclerotic calcification of the internal carotid arteries at the skull base without hemodynamically significant stenosis. --Anterior cerebral arteries (ACA): Normal. Absent left A1 segment, normal variant --Middle cerebral arteries (MCA): Normal. VENOUS SINUSES: As permitted by contrast timing, patent. ANATOMIC VARIANTS: None Review of the MIP images confirms the above findings. IMPRESSION: 1. No emergent large vessel occlusion or hemodynamically significant stenosis. 2. Bilateral atherosclerotic disease at the carotid bifurcations with less than 50% stenosis. 3. 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 Electronically Signed   By: Ulyses Jarred M.D.   On: 05/22/2019 03:05     Medications:     Scheduled Medications:  aspirin EC  81 mg Oral Daily   atorvastatin  80 mg Oral q1800   carvedilol  3.125 mg Oral BID WC   clopidogrel  75 mg Oral Daily   heparin  5,000 Units Subcutaneous Q8H   losartan  12.5 mg Oral QHS    meclizine  25 mg Oral Q8H   mometasone-formoterol  2 puff Inhalation BID   ranolazine  500 mg Oral BID   sodium chloride flush  3 mL Intravenous Q12H   venlafaxine XR  75 mg  Oral Q breakfast    Infusions:  sodium chloride      PRN Medications: sodium chloride, acetaminophen, ondansetron (ZOFRAN) IV, sodium chloride flush   Assessment/Plan   1. Acute on chronic systolic CHF: Ischemic cardiomyopathy.  Echo in 1/20 with EF 40-45%, but now down to 20-25% on echo from 5/20.  Patient was initially admitted at [facility] and transferred here due to concern for low output HF/need for inotropes. He was diuresed at [facility].  The cause of the fall in EF is uncertain, he had coronary angiogram at [facility] with stable anatomy (no intervention).  He paces his RV only 5% of the time (device interrogated).  Currently, SBP in 110s-120s. RHC on 5/28 showed low filling pressures and good cardiac output.  The low BP at [facility], I think, was related to diuresis + Entresto use.  We have started him on low doses of Coreg and losartan and BP has tolerated so far. He remains euvolemic on exam today, we have not given him any diuretic.   - Continue Coreg 3.125 mg bid and losartan 12.5 mg daily.   - Need to recheck orthostatics today.  2. CAD: Admitted with chest pain and mild troponin elevation (no trend).  Suspect that the mild troponin elevation was demand ischemia from CHF.  He has now been diuresed.  Cath showed stable anatomy, no intervention. No chest pain.  - Continue ASA 81, Plavix 75.  - Continue statin - Continue ranolazine.  3. CKD: Stage 3.  Creatinine lower at 1.47.  4. Aortic stenosis: Mild to moderate on echo this admission.Will follow  5. Nausea/vomiting/dizziness: +Vertigo.  Seen by Neuro on 5/30 and felt to have cerebellar stroke, repeat CT head with no acute changes. Carotid u/s 1-39% bilaterally. Vertebrals ok.  No MRI due to PPM.  Had CTA head/neck without large vessel occlusion or stenosis. Exam is  improved.  - Will need to discuss with neurologist continued ASA/Plavix versus anticoagulation given low EF (evidence unclear in this situation).  If not anticoagulation, will need to discuss LINQ monitor.  6. Orthostatic hypotension: Improved with IVF.  - Repeat orthostatics.  - May end up needing low dose midodrine.  - Consider w/u for TTR at some point 7. Deconditioning - consult PT, ?need for CIR.    Length of Stay: 5  Loralie Champagne, MD  05/22/2019, 7:27 AM  Advanced Heart Failure Team Pager [phone] (M-F; 7a - 4p)  Please contact [facility] for night-coverage after hours (4p -7a ) and weekends on [email or web address]

## 2019-05-22 NOTE — Progress Notes (Signed)
CARDIAC REHAB PHASE I   PRE:  Rate/Rhythm: 70 pacing    BP: sitting 99/69, standing 73/68, sat again 90/70, standing again on 2nd BP attempt 97/63    SaO2: 96 RA  MODE:  Ambulation: 210 ft   POST:  Rate/Rhythm: 97 pacing    BP: sitting 119/69     SaO2: 92 RA  Pt sts he feels a little lightheaded today. Not as well as he felt yesterday. Took orthostatics. BP 73/68 after 2 min standing. Pt wanted to sit again (see above BPs). Able to stand again and then walk. Seems to always have slight dizziness. BP might drop and come up, just encouraged him to always stand for a few minutes before walking at home. He was able to walk in hall with fairly steady gait, RW, gait belt for security. C/o fatigued legs and rested on wall x1 min. Return to recliner, BP 119/69.  [id number]   Darrick Meigs CES, ACSM 05/22/2019 1:49 PM

## 2019-05-22 NOTE — Progress Notes (Signed)
STROKE TEAM PROGRESS NOTE   SUBJECTIVE (INTERVAL HISTORY) No family at bedside.  Patient initially sleeping in bed, however, easily arousable.  Denies any neuro changes.  CT head and neck no significance and posterior circulation high-grade stenosis.   OBJECTIVE Vitals:   05/22/19 0712 05/22/19 1128 05/22/19 1426 05/22/19 1542  BP: 110/67   130/85  Pulse: 81 62    Resp: 18 19    Temp: 97.8 F (36.6 C) (!) 97.4 F (36.3 C) 97.6 F (36.4 C)   TempSrc: Oral Oral Oral   SpO2: 92% 100%    Weight:      Height:        CBC:  Recent Labs  Lab 05/21/19 0416 05/22/19 0302  WBC 4.9 4.1  NEUTROABS 3.3 2.7  HGB 12.3* 12.4*  HCT 36.7* 36.7*  MCV 85.5 85.0  PLT 128* 137*    Basic Metabolic Panel:  Recent Labs  Lab 05/16/19 0507 05/17/19 0335  05/21/19 0416 05/22/19 0302  NA 134* 135   < > 131* 131*  K 4.8 5.0   < > 4.9 4.7  CL 97* 99   < > 98 96*  CO2 30 28   < > 24 25  GLUCOSE 110* 111*   < > 89 85  BUN 38* 39*   < > 25* 24*  CREATININE 1.56* 1.34*   < > 1.56* 1.47*  CALCIUM 8.5* 8.6*   < > 8.7* 9.0  MG 2.7* 2.6*  --   --   --   PHOS  --  4.3  --   --   --    < > = values in this interval not displayed.    Lipid Panel:     Component Value Date/Time   CHOL 151 05/19/2019 0304   TRIG 92 05/19/2019 0304   HDL 58 05/19/2019 0304   CHOLHDL 2.6 05/19/2019 0304   VLDL 18 05/19/2019 0304   LDLCALC 75 05/19/2019 0304   HgbA1c:  Lab Results  Component Value Date   HGBA1C 6.0 (H) 05/19/2019   Urine Drug Screen: No results found for: LABOPIA, COCAINSCRNUR, LABBENZ, AMPHETMU, THCU, LABBARB  Alcohol Level No results found for: [facility]  IMAGING  CT Head Wo Contrast 05/16/2019 IMPRESSION: Atrophy, chronic microvascular disease. No acute intracranial abnormality.  Ct Head Wo Contrast  Result Date: 05/21/2019 CLINICAL DATA:  85 year old male with diplopia, dizziness. EXAM: CT HEAD WITHOUT CONTRAST TECHNIQUE: Contiguous axial images were obtained from the base of the skull  through the vertex without intravenous contrast. COMPARISON:  05/16/2019. FINDINGS: Brain: Bilateral basal ganglia and patchy periventricular white matter small vessel disease appears stable. Stable gray-white matter differentiation throughout the brain. No midline shift, ventriculomegaly, mass effect, evidence of mass lesion, intracranial hemorrhage or evidence of cortically based acute infarction. Vascular: Calcified atherosclerosis at the skull base. No suspicious intracranial vascular hyperdensity. Skull: Negative. Sinuses/Orbits: Paranasal sinuses tympanic cavities and mastoids are clear. Other: Negative orbits. Visualized scalp soft tissues are within normal limits. IMPRESSION: Cerebral white matter and deep gray matter small vessel disease appears stable since 05/16/2019. No acute intracranial abnormality identified. Electronically Signed   By: Genevie Ann M.D.   On: 05/21/2019 05:29   Transthoracic Echocardiogram  05/10/2019 IMPRESSIONS  1. The left ventricle has severely reduced systolic function, with an ejection fraction of 20-25%. The cavity size was normal. Left ventricular diastolic Doppler parameters are indeterminate.  2. The right ventricle has normal systolic function. The cavity was normal. There is no increase in right ventricular wall  thickness.  3. The mitral valve is grossly normal. Mitral valve regurgitation is mild to moderate by color flow Doppler.  4. Tricuspid valve regurgitation is mild-moderate.  5. The aortic valve is tricuspid. Aortic valve regurgitation is mild to moderate by color flow Doppler. Moderate stenosis of the aortic valve.   Bilateral Carotid Dopplers  05/20/2019 Right Carotid: Velocities in the right ICA are consistent with a 1-39% stenosis. Left Carotid: Velocities in the left ICA are consistent with a 1-39% stenosis. Vertebrals:  Bilateral vertebral arteries demonstrate antegrade flow. Subclavians: Normal flow hemodynamics were seen in bilateral subclavian               arteries.  EKG - paced - rate 71 BPM. (See cardiology reading for complete details)  Ct Angio Head W Or Wo Contrast  Result Date: 05/22/2019 CLINICAL DATA:  TIA EXAM: CT ANGIOGRAPHY HEAD AND NECK TECHNIQUE: Multidetector CT imaging of the head and neck was performed using the standard protocol during bolus administration of intravenous contrast. Multiplanar CT image reconstructions and MIPs were obtained to evaluate the vascular anatomy. Carotid stenosis measurements (when applicable) are obtained utilizing NASCET criteria, using the distal internal carotid diameter as the denominator. CONTRAST:  53mL OMNIPAQUE IOHEXOL 350 MG/ML SOLN COMPARISON:  Head CT 05/21/2019 FINDINGS: CT HEAD FINDINGS Brain: There is no mass, hemorrhage or extra-axial collection. The size and configuration of the ventricles and extra-axial CSF spaces are normal. There are multiple old infarcts of the deep gray nuclei and periventricular white matter. There is hypoattenuation of the periventricular white matter, most commonly indicating chronic ischemic microangiopathy. Old left cerebellar infarct. Skull: The visualized skull base, calvarium and extracranial soft tissues are normal. Sinuses/Orbits: No fluid levels or advanced mucosal thickening of the visualized paranasal sinuses. No mastoid or middle ear effusion. The orbits are normal. CTA NECK FINDINGS SKELETON: There is no bony spinal canal stenosis. No lytic or blastic lesion. OTHER NECK: Normal pharynx, larynx and major salivary glands. No cervical lymphadenopathy. Unremarkable thyroid gland. UPPER CHEST: No pneumothorax or pleural effusion. No nodules or masses. AORTIC ARCH: There is no visualized calcific atherosclerosis of the aortic arch. There is no aneurysm, dissection or hemodynamically significant stenosis of the visualized ascending aorta and aortic arch. The visualized proximal subclavian arteries are widely patent. RIGHT CAROTID SYSTEM: --Common carotid artery:  Widely patent origin without common carotid artery dissection or aneurysm. Mild atherosclerotic calcification at the carotid bifurcation without hemodynamically significant stenosis. --Internal carotid artery: No dissection, occlusion or aneurysm. Mild atherosclerotic calcification at the carotid bifurcation without hemodynamically significant stenosis. --External carotid artery: No acute abnormality. LEFT CAROTID SYSTEM: --Common carotid artery: Widely patent origin without common carotid artery dissection or aneurysm. Mild atherosclerotic calcification at the carotid bifurcation. --Internal carotid artery: No dissection, occlusion or aneurysm. There is mixed density atherosclerosis extending into the proximal ICA, resulting in less than 50% stenosis. --External carotid artery: No acute abnormality. VERTEBRAL ARTERIES: Codominant configuration. Both origins are clearly patent. No dissection, occlusion or flow-limiting stenosis to the skull base (V1-V3 segments). CTA HEAD FINDINGS POSTERIOR CIRCULATION: --Vertebral arteries: Normal V4 segments. --Posterior inferior cerebellar arteries (PICA): Patent origins from the vertebral arteries. --Anterior inferior cerebellar arteries (AICA): Patent origins from the basilar artery. --Basilar artery: Normal. --Superior cerebellar arteries: Normal. --Posterior cerebral arteries (PCA): Normal. There are bilateral posterior communicating arteries (p-comm) that partially supply the PCAs. ANTERIOR CIRCULATION: --Intracranial internal carotid arteries: Atherosclerotic calcification of the internal carotid arteries at the skull base without hemodynamically significant stenosis. --Anterior cerebral arteries (ACA): Normal. Absent  left A1 segment, normal variant --Middle cerebral arteries (MCA): Normal. VENOUS SINUSES: As permitted by contrast timing, patent. ANATOMIC VARIANTS: None Review of the MIP images confirms the above findings. IMPRESSION: 1. No emergent large vessel occlusion  or hemodynamically significant stenosis. 2. Bilateral atherosclerotic disease at the carotid bifurcations with less than 50% stenosis. 3. 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 Electronically Signed   By: Ulyses Jarred M.D.   On: 05/22/2019 03:05   Ct Angio Neck W Or Wo Contrast  Result Date: 05/22/2019 CLINICAL DATA:  TIA EXAM: CT ANGIOGRAPHY HEAD AND NECK TECHNIQUE: Multidetector CT imaging of the head and neck was performed using the standard protocol during bolus administration of intravenous contrast. Multiplanar CT image reconstructions and MIPs were obtained to evaluate the vascular anatomy. Carotid stenosis measurements (when applicable) are obtained utilizing NASCET criteria, using the distal internal carotid diameter as the denominator. CONTRAST:  61mL OMNIPAQUE IOHEXOL 350 MG/ML SOLN COMPARISON:  Head CT 05/21/2019 FINDINGS: CT HEAD FINDINGS Brain: There is no mass, hemorrhage or extra-axial collection. The size and configuration of the ventricles and extra-axial CSF spaces are normal. There are multiple old infarcts of the deep gray nuclei and periventricular white matter. There is hypoattenuation of the periventricular white matter, most commonly indicating chronic ischemic microangiopathy. Old left cerebellar infarct. Skull: The visualized skull base, calvarium and extracranial soft tissues are normal. Sinuses/Orbits: No fluid levels or advanced mucosal thickening of the visualized paranasal sinuses. No mastoid or middle ear effusion. The orbits are normal. CTA NECK FINDINGS SKELETON: There is no bony spinal canal stenosis. No lytic or blastic lesion. OTHER NECK: Normal pharynx, larynx and major salivary glands. No cervical lymphadenopathy. Unremarkable thyroid gland. UPPER CHEST: No pneumothorax or pleural effusion. No nodules or masses. AORTIC ARCH: There is no visualized calcific atherosclerosis of the aortic arch. There is no aneurysm, dissection or  hemodynamically significant stenosis of the visualized ascending aorta and aortic arch. The visualized proximal subclavian arteries are widely patent. RIGHT CAROTID SYSTEM: --Common carotid artery: Widely patent origin without common carotid artery dissection or aneurysm. Mild atherosclerotic calcification at the carotid bifurcation without hemodynamically significant stenosis. --Internal carotid artery: No dissection, occlusion or aneurysm. Mild atherosclerotic calcification at the carotid bifurcation without hemodynamically significant stenosis. --External carotid artery: No acute abnormality. LEFT CAROTID SYSTEM: --Common carotid artery: Widely patent origin without common carotid artery dissection or aneurysm. Mild atherosclerotic calcification at the carotid bifurcation. --Internal carotid artery: No dissection, occlusion or aneurysm. There is mixed density atherosclerosis extending into the proximal ICA, resulting in less than 50% stenosis. --External carotid artery: No acute abnormality. VERTEBRAL ARTERIES: Codominant configuration. Both origins are clearly patent. No dissection, occlusion or flow-limiting stenosis to the skull base (V1-V3 segments). CTA HEAD FINDINGS POSTERIOR CIRCULATION: --Vertebral arteries: Normal V4 segments. --Posterior inferior cerebellar arteries (PICA): Patent origins from the vertebral arteries. --Anterior inferior cerebellar arteries (AICA): Patent origins from the basilar artery. --Basilar artery: Normal. --Superior cerebellar arteries: Normal. --Posterior cerebral arteries (PCA): Normal. There are bilateral posterior communicating arteries (p-comm) that partially supply the PCAs. ANTERIOR CIRCULATION: --Intracranial internal carotid arteries: Atherosclerotic calcification of the internal carotid arteries at the skull base without hemodynamically significant stenosis. --Anterior cerebral arteries (ACA): Normal. Absent left A1 segment, normal variant --Middle cerebral arteries  (MCA): Normal. VENOUS SINUSES: As permitted by contrast timing, patent. ANATOMIC VARIANTS: None Review of the MIP images confirms the above findings. IMPRESSION: 1. No emergent large vessel occlusion or hemodynamically significant stenosis. 2. Bilateral atherosclerotic disease at the carotid bifurcations  with less than 50% stenosis. 3. 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 Electronically Signed   By: Ulyses Jarred M.D.   On: 05/22/2019 03:05     PHYSICAL EXAM  Temp:  [97.4 F (36.3 C)-98.1 F (36.7 C)] 97.6 F (36.4 C) (06/02 1426) Pulse Rate:  [62-81] 62 (06/02 1128) Resp:  [14-19] 19 (06/02 1128) BP: (97-130)/(62-85) 130/85 (06/02 1542) SpO2:  [92 %-100 %] 100 % (06/02 1128) Weight:  [76.6 kg] 76.6 kg (06/02 0500)  General - Well nourished, well developed, in no apparent distress.  Ophthalmologic - fundi not visualized due to noncooperation.  Cardiovascular - Regular rate and rhythm.  Mental Status -  Level of arousal and orientation to time, place, and person were intact. Language including expression, naming, repetition, comprehension was assessed and found intact. Fund of Knowledge was assessed and was intact.  Cranial Nerves II - XII - II - Visual field intact OU. III, IV, VI - Extraocular movements intact. V - Facial sensation intact bilaterally. VII - Facial movement intact bilaterally. VIII - Hearing & vestibular intact bilaterally, no nystagmus  X - Palate elevates symmetrically. XI - Chin turning & shoulder shrug intact bilaterally. XII - Tongue protrusion intact.  Motor Strength - The patient's strength was normal in all extremities and pronator drift was absent.  Bulk was normal and fasciculations were absent.   Motor Tone - Muscle tone was assessed at the neck and appendages and was normal.  Reflexes - The patient's reflexes were symmetrical in all extremities and he had no pathological reflexes.  Sensory - Light touch,  temperature/pinprick were assessed and were symmetrical.    Coordination - The patient had normal movements in the hands and feet with no ataxia or dysmetria.  Tremor was absent.  Gait and Station - deferred.    ASSESSMENT/PLAN [name] is a 85 y.o. male with history of aortic stenosis, basal cell ca, CKD, CAD, angina, SB, CHF, DDD, HTN, HLD, radiculopathy, MI and pacemaker admitted to [facility] on 5/20 with SOB, CP and found to be in acute HF with fluid overload and LV dysfunction. EF dropped from 40% to 20%. On 5/27 the pt developed left arm dysmetria, diplopia, n/v and a dizzy sensation. He was admitted to [facility] 5/28 for heart cath. Dr Cheral Marker consulted on the pt 05/19/19 for diplopia and dizziness.  He did not receive IV t-PA due to late presentation (>4.5 hours from time of onset).   Brainstem/cerebellar small stroke vs. TIA  Resultant  Dizziness improved, ? Diplopia and ataxia - now resolved  CT head - no acute abnormality, b/l caudate head lacunes  Repeat CT Head - no acute abnormality  MRI head - not able to perform due to ppm  CTA H&N no significant stent posterior discoloration high-grade stenosis, bilateral ICA bifurcation 50% stenosis  Carotid Doppler unremarkable  2D Echo - EF 20 - 25%. No definite cardiac source of emboli identified.   LDL - 75  HgbA1c - 6.0  VTE prophylaxis -  Heparin  Diet  - Heart healthy with thin liquids.  aspirin 81 mg daily and clopidogrel 75 mg daily prior to admission, now on aspirin 81 mg daily and clopidogrel 75 mg daily. Given low EF, recommend Eliquis until EF >35%.  Once EF at goal, Eliquis can be discontinued and resume dual antiplatelet.  Patient counseled to be compliant with his antithrombotic medications  Ongoing aggressive stroke risk factor management  Therapy recommendations: Home health PT  Disposition:  Pending  Orthostatic hypotension  Pt description concerning for orthostatic hypotension  5/29 BP  lying 94/83, sitting 88/65, standing 98/80  5/31 BP lying 117/66, sitting 95/58, standing 83/53 and 97/53  Recommend TED hose  Hypotension related to CHF, improved  BP improved  On coreg, losartan  HF team on board . Long-term BP goal normotensive  Cardiomyopathy  12/27/18 EF 40%  05/01/19 EF 20%  Right Heart Cath 5/28 - low filling pressures and good cardiac output.  On coreg, and losartan  Likely etiology for low BP  BP goal normotensive if able   Hyperlipidemia  Lipid lowering medication PTA: Lipitor 80 mg daily  LDL 75, goal < 70  Current lipid lowering medication: Lipitor 80 mg daily  Continue statin at discharge  Other Stroke Risk Factors  Advanced age  Former cigarette smoker - quit 38 years ago  Coronary artery disease / MI   Other Active Problems  CKD - creatinine - 1.56->1.34->1.48->1.65-> 1.56->1.47  Hyponatremia - 130->131-> 131   PPM - interogated by cardiology this admission - no mentioning of A. fib at this time  Hospital day # 5  Neurology will sign off. Please call with questions. Pt will follow up with stroke clinic Dr. [name] at [facility] in about 4 weeks. Thanks for the consult.   Rosalin Hawking, MD PhD Stroke Neurology 05/22/2019 7:05 PM   To contact Stroke Continuity provider, please refer to [email or web address] After hours, contact General Neurology

## 2019-05-22 NOTE — Discharge Summary (Signed)
Advanced Heart Failure Team  Discharge Summary   Patient ID: Kristopher Fernandez MRN: [id number], DOB/AGE: January 16, 1932 85 y.o. Admit date: 05/17/2019 D/C date:     05/23/2019   Primary Discharge Diagnoses:  1. Acute on chronic systolic CHF: Ischemic cardiomyopathy.  2. CAD 3. CKD: Stage 3.  Renal function was followed closely.  4. Aortic stenosis: Mild to moderate on echo this admission.Will follow  5. Nausea/vomiting/dizziness: +Vertigo.  6. Orthostatic hypotension 7. Deconditioning  Hospital Course:  Kristopher Fernandez is an 85 y.o. with history of CAD s/p multiple PCIs, ischemic cardiomyopathy, sick sinus syndrome s/p PPM, and CKD stage 3 was transferred from [facility] due to concern for low output HF.    Patient has a long history of CAD detailed in the [place] below.  He also has an ischemic cardiomyopathy, with prior echo in 1/20 showing EF 40-45%.  He had a pacemaker placed for bradycardia in 2979, I am uncertain of the make from notes and the patient does not remember either.  He was admitted with somewhat atypical chest pain on 5/20, TnI was mildly elevated at 0.08, with serial troponins there was no trend (maximal 0.09).  He underwent left heart cath, showing stable disease with no intervention planned (chronic occlusion of mid LCx and 85% ostial PDA).  His echo showed that EF was lower than prior at 20-25%.  He was thought to be volume overloaded.  He was diuresed with IV Lasix, and Coreg + Entresto were started.  Patient's BP fell significantly with initiation of cardiac meds and he had orthostatic symptoms.  All his cardiac meds had to be stopped.  There was concern that he may have low output HF and may require inotropic support, so he was transferred to [facility].    See below for detailed problem list.   1. Acute on chronic systolic CHF: Ischemic cardiomyopathy. Echo in 1/20 with EF 40-45%, but now down to 20-25% on echo from 5/20. Patient was initially admitted at [facility] and transferred here due to  concern for low output HF/need for inotropes. He was diuresed at [facility]. The cause of the fall in EF is uncertain, he had coronary angiogram at [facility] with stable anatomy (no intervention).  He paces his RV only 5% of the time (device interrogated). RHC on 5/28 showed low filling pressures and good cardiac output.  The low BP at [facility], was thought to be related to diuresis + Entresto use.  Delene Loll was stopped. He was started on low doses of Coreg and losartan and BP has tolerated so far. Losartan was later stopped due to hypotension.  He remained euvolemic and did not require diuretics.    2. CAD: Admitted with chest pain and mild troponin elevation (no trend). Suspect that the mild troponin elevation was demand ischemia from CHF. He has now been diuresed. Cath showed stable anatomy, no intervention. No chest pain.  He was on asa and plavix until he was started on eliquis.   - Continue statin - Continue ranolazine.  3. CKD: Stage 3.  Renal function was followed closely. Pl 4. Aortic stenosis: Mild to moderate on echo this admission.Will follow  5. Nausea/vomiting/dizziness: +Vertigo.  Seen by Neuro on 5/30 and felt to have cerebellar stroke, repeat CT head with no acute changes. Carotid u/s 1-39% bilaterally. Vertebrals ok.  No MRI due to PPM.  Had CTA head/neck without large vessel occlusion or stenosis.  - Discussed with Dr Erlinda Hong (Neurologist) and he recommended stopping  ASA/Plavix and start eliquis 5  mg twice a day with low EF. . 6. Orthostatic hypotension: Improved with IVF.  - May end up needing low dose midodrine. Losartan was stopped prior to discharge.  - Consider w/u for TTR at some point 7. Deconditioning PT evaluated and recommended HHPT.   Today he is being discharged in stable condition. He will be followed [facility] and HHPT.  For now he will not drive until he returns to see Dr Aundra Dubin. Dr Aundra Dubin will see in follow up 1-2 viisit then he will return to his cardiologisit in  [place].   Discharge Vitals: Blood pressure (!) 111/51, pulse 67, temperature 97.6 F (36.4 C), temperature source Oral, resp. rate 15, height 6' (1.829 m), weight 76.7 kg, SpO2 98 %.  Labs: Lab Results  Component Value Date   WBC 4.1 05/22/2019   HGB 12.4 (L) 05/22/2019   HCT 36.7 (L) 05/22/2019   MCV 85.0 05/22/2019   PLT 137 (L) 05/22/2019    Recent Labs  Lab 05/23/19 0730  NA 132*  K 5.1  CL 96*  CO2 28  BUN 19  CREATININE 1.46*  CALCIUM 9.5  GLUCOSE 104*   Lab Results  Component Value Date   CHOL 151 05/19/2019   HDL 58 05/19/2019   LDLCALC 75 05/19/2019   TRIG 92 05/19/2019   BNP (last 3 results) Recent Labs    05/09/19 1034  BNP 1,462.0*    ProBNP (last 3 results) No results for input(s): PROBNP in the last 8760 hours.   Diagnostic Studies/Procedures  RHC Procedural Findings (5/28): Hemodynamics (mmHg) RA mean 3 RV 38/3 PA 38/8, mean 18 PCWP mean 6 Oxygen saturations: PA 68% AO 97% Cardiac Output (Fick) 5.4  Cardiac Index (Fick) 2.68 PVR 2.2 WU Ct Angio Head W Or Wo Contrast  Result Date: 05/22/2019 CLINICAL DATA:  TIA EXAM: CT ANGIOGRAPHY HEAD AND NECK TECHNIQUE: Multidetector CT imaging of the head and neck was performed using the standard protocol during bolus administration of intravenous contrast. Multiplanar CT image reconstructions and MIPs were obtained to evaluate the vascular anatomy. Carotid stenosis measurements (when applicable) are obtained utilizing NASCET criteria, using the distal internal carotid diameter as the denominator. CONTRAST:  21mL OMNIPAQUE IOHEXOL 350 MG/ML SOLN COMPARISON:  Head CT 05/21/2019 FINDINGS: CT HEAD FINDINGS Brain: There is no mass, hemorrhage or extra-axial collection. The size and configuration of the ventricles and extra-axial CSF spaces are normal. There are multiple old infarcts of the deep gray nuclei and periventricular white matter. There is hypoattenuation of the periventricular white matter, most  commonly indicating chronic ischemic microangiopathy. Old left cerebellar infarct. Skull: The visualized skull base, calvarium and extracranial soft tissues are normal. Sinuses/Orbits: No fluid levels or advanced mucosal thickening of the visualized paranasal sinuses. No mastoid or middle ear effusion. The orbits are normal. CTA NECK FINDINGS SKELETON: There is no bony spinal canal stenosis. No lytic or blastic lesion. OTHER NECK: Normal pharynx, larynx and major salivary glands. No cervical lymphadenopathy. Unremarkable thyroid gland. UPPER CHEST: No pneumothorax or pleural effusion. No nodules or masses. AORTIC ARCH: There is no visualized calcific atherosclerosis of the aortic arch. There is no aneurysm, dissection or hemodynamically significant stenosis of the visualized ascending aorta and aortic arch. The visualized proximal subclavian arteries are widely patent. RIGHT CAROTID SYSTEM: --Common carotid artery: Widely patent origin without common carotid artery dissection or aneurysm. Mild atherosclerotic calcification at the carotid bifurcation without hemodynamically significant stenosis. --Internal carotid artery: No dissection, occlusion or aneurysm. Mild atherosclerotic calcification at the carotid bifurcation  without hemodynamically significant stenosis. --External carotid artery: No acute abnormality. LEFT CAROTID SYSTEM: --Common carotid artery: Widely patent origin without common carotid artery dissection or aneurysm. Mild atherosclerotic calcification at the carotid bifurcation. --Internal carotid artery: No dissection, occlusion or aneurysm. There is mixed density atherosclerosis extending into the proximal ICA, resulting in less than 50% stenosis. --External carotid artery: No acute abnormality. VERTEBRAL ARTERIES: Codominant configuration. Both origins are clearly patent. No dissection, occlusion or flow-limiting stenosis to the skull base (V1-V3 segments). CTA HEAD FINDINGS POSTERIOR CIRCULATION:  --Vertebral arteries: Normal V4 segments. --Posterior inferior cerebellar arteries (PICA): Patent origins from the vertebral arteries. --Anterior inferior cerebellar arteries (AICA): Patent origins from the basilar artery. --Basilar artery: Normal. --Superior cerebellar arteries: Normal. --Posterior cerebral arteries (PCA): Normal. There are bilateral posterior communicating arteries (p-comm) that partially supply the PCAs. ANTERIOR CIRCULATION: --Intracranial internal carotid arteries: Atherosclerotic calcification of the internal carotid arteries at the skull base without hemodynamically significant stenosis. --Anterior cerebral arteries (ACA): Normal. Absent left A1 segment, normal variant --Middle cerebral arteries (MCA): Normal. VENOUS SINUSES: As permitted by contrast timing, patent. ANATOMIC VARIANTS: None Review of the MIP images confirms the above findings. IMPRESSION: 1. No emergent large vessel occlusion or hemodynamically significant stenosis. 2. Bilateral atherosclerotic disease at the carotid bifurcations with less than 50% stenosis. 3. 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 Electronically Signed   By: Ulyses Jarred M.D.   On: 05/22/2019 03:05   Ct Angio Neck W Or Wo Contrast  Result Date: 05/22/2019 CLINICAL DATA:  TIA EXAM: CT ANGIOGRAPHY HEAD AND NECK TECHNIQUE: Multidetector CT imaging of the head and neck was performed using the standard protocol during bolus administration of intravenous contrast. Multiplanar CT image reconstructions and MIPs were obtained to evaluate the vascular anatomy. Carotid stenosis measurements (when applicable) are obtained utilizing NASCET criteria, using the distal internal carotid diameter as the denominator. CONTRAST:  39mL OMNIPAQUE IOHEXOL 350 MG/ML SOLN COMPARISON:  Head CT 05/21/2019 FINDINGS: CT HEAD FINDINGS Brain: There is no mass, hemorrhage or extra-axial collection. The size and configuration of the ventricles and  extra-axial CSF spaces are normal. There are multiple old infarcts of the deep gray nuclei and periventricular white matter. There is hypoattenuation of the periventricular white matter, most commonly indicating chronic ischemic microangiopathy. Old left cerebellar infarct. Skull: The visualized skull base, calvarium and extracranial soft tissues are normal. Sinuses/Orbits: No fluid levels or advanced mucosal thickening of the visualized paranasal sinuses. No mastoid or middle ear effusion. The orbits are normal. CTA NECK FINDINGS SKELETON: There is no bony spinal canal stenosis. No lytic or blastic lesion. OTHER NECK: Normal pharynx, larynx and major salivary glands. No cervical lymphadenopathy. Unremarkable thyroid gland. UPPER CHEST: No pneumothorax or pleural effusion. No nodules or masses. AORTIC ARCH: There is no visualized calcific atherosclerosis of the aortic arch. There is no aneurysm, dissection or hemodynamically significant stenosis of the visualized ascending aorta and aortic arch. The visualized proximal subclavian arteries are widely patent. RIGHT CAROTID SYSTEM: --Common carotid artery: Widely patent origin without common carotid artery dissection or aneurysm. Mild atherosclerotic calcification at the carotid bifurcation without hemodynamically significant stenosis. --Internal carotid artery: No dissection, occlusion or aneurysm. Mild atherosclerotic calcification at the carotid bifurcation without hemodynamically significant stenosis. --External carotid artery: No acute abnormality. LEFT CAROTID SYSTEM: --Common carotid artery: Widely patent origin without common carotid artery dissection or aneurysm. Mild atherosclerotic calcification at the carotid bifurcation. --Internal carotid artery: No dissection, occlusion or aneurysm. There is mixed density atherosclerosis extending into the proximal  ICA, resulting in less than 50% stenosis. --External carotid artery: No acute abnormality. VERTEBRAL  ARTERIES: Codominant configuration. Both origins are clearly patent. No dissection, occlusion or flow-limiting stenosis to the skull base (V1-V3 segments). CTA HEAD FINDINGS POSTERIOR CIRCULATION: --Vertebral arteries: Normal V4 segments. --Posterior inferior cerebellar arteries (PICA): Patent origins from the vertebral arteries. --Anterior inferior cerebellar arteries (AICA): Patent origins from the basilar artery. --Basilar artery: Normal. --Superior cerebellar arteries: Normal. --Posterior cerebral arteries (PCA): Normal. There are bilateral posterior communicating arteries (p-comm) that partially supply the PCAs. ANTERIOR CIRCULATION: --Intracranial internal carotid arteries: Atherosclerotic calcification of the internal carotid arteries at the skull base without hemodynamically significant stenosis. --Anterior cerebral arteries (ACA): Normal. Absent left A1 segment, normal variant --Middle cerebral arteries (MCA): Normal. VENOUS SINUSES: As permitted by contrast timing, patent. ANATOMIC VARIANTS: None Review of the MIP images confirms the above findings. IMPRESSION: 1. No emergent large vessel occlusion or hemodynamically significant stenosis. 2. Bilateral atherosclerotic disease at the carotid bifurcations with less than 50% stenosis. 3. 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 Electronically Signed   By: Ulyses Jarred M.D.   On: 05/22/2019 03:05    Discharge Medications   Allergies as of 05/23/2019   No Known Allergies     Medication List    STOP taking these medications   aspirin EC 81 MG tablet   clopidogrel 75 MG tablet Commonly known as:  PLAVIX     TAKE these medications   apixaban 5 MG Tabs tablet Commonly known as:  ELIQUIS Take 1 tablet (5 mg total) by mouth 2 (two) times daily.   atorvastatin 80 MG tablet Commonly known as:  LIPITOR Take 80 mg by mouth daily.   budesonide-formoterol 160-4.5 MCG/ACT inhaler Commonly known as:  Symbicort Inhale  2 puffs into the lungs 2 (two) times daily.   carvedilol 3.125 MG tablet Commonly known as:  COREG Take 1 tablet (3.125 mg total) by mouth 2 (two) times daily with a meal.   meclizine 25 MG tablet Commonly known as:  ANTIVERT Take 1 tablet (25 mg total) by mouth 3 (three) times daily as needed for dizziness.   ranolazine 500 MG 12 hr tablet Commonly known as:  RANEXA Take 500 mg by mouth 2 (two) times daily.   venlafaxine XR 75 MG 24 hr capsule Commonly known as:  EFFEXOR-XR Take 1 capsule (75 mg total) by mouth daily with breakfast.            Durable Medical Equipment  (From admission, onward)         Start     Ordered   05/22/19 1548  Heart failure home health orders  (Heart failure home health orders / Face to face)  Once    Comments:  Heart Failure Follow-up Care:  Verify follow-up appointments per Patient Discharge Instructions. Confirm transportation arranged. Reconcile home medications with discharge medication list. Remove discontinued medications from use. Assist patient/caregiver to manage medications using pill box. Reinforce low sodium food selection Assessments: Vital signs and oxygen saturation at each visit. Assess home environment for safety concerns, caregiver support and availability of low-sodium foods. Consult Education officer, museum, PT/OT, Dietitian, and CNA based on assessments. Perform comprehensive cardiopulmonary assessment. Notify MD for any change in condition or weight gain of 3 pounds in one day or 5 pounds in one week with symptoms. Daily Weights and Symptom Monitoring: Ensure patient has access to scales. Teach patient/caregiver to weigh daily before breakfast and after voiding using same scale and record.  Teach patient/caregiver to track weight and symptoms and when to notify Provider. Activity: Develop individualized activity plan with patient/caregiver.  HHRN 2 wk 2 for CP assessment   PT at [facility] 2 wk 2 for CP Rehab.  Question Answer  Comment  Heart Failure Follow-up Care Advanced Heart Failure ([facility]F) [facility] at [phone]   Lab frequency Other see comments   Fax lab results to AHF Clinic at [phone]   Diet Low Sodium Heart Healthy   Fluid restrictions: 2000 mL Fluid      05/22/19 1548          Disposition   The patient will be discharged in stable condition to home. Discharge Instructions    Amb Referral to Cardiac Rehabilitation   Complete by:  As directed    Diagnosis:  Heart Failure (see criteria below if ordering Phase II)   Heart Failure Type:  Chronic Systolic & Diastolic   After initial evaluation and assessments completed: Virtual Based Care may be provided alone or in conjunction with Phase 2 Cardiac Rehab based on patient barriers.:  Yes   Ambulatory referral to Neurology   Complete by:  As directed    Follow up with Dr. [name] at [facility] in 4-6 weeks. Too complicated for NP to follow. Thanks.   Diet - low sodium heart healthy   Complete by:  As directed    Diet - low sodium heart healthy   Complete by:  As directed    Discharge instructions   Complete by:  As directed    No driving until you return to see Dr Aundra Dubin   Increase activity slowly   Complete by:  As directed    Increase activity slowly   Complete by:  As directed      [place] Follow up.   Specialty:  [place] Why:  RN,PT, OT, aide Contact information: [place] [address] [phone]        Larey Dresser, MD Follow up on 05/31/2019.   Specialty:  Cardiology Why:  0900. Located on the 1st floor of [place]. Garage Code 2094.  Contact information: [address] [place] [address] [phone]        Garvin Fila, MD. Schedule an appointment as soon as possible for a visit in 4 week(s).   Specialties:  Neurology, Radiology Contact information: [address] [place] [address] [phone]              Duration of Discharge Encounter: Greater than 35 minutes   Signed,   NP-C  05/23/2019, 8:21 AM

## 2019-05-23 ENCOUNTER — Telehealth: Payer: Self-pay | Admitting: Neurology

## 2019-05-23 LAB — BASIC METABOLIC PANEL
Anion gap: 8 (ref 5–15)
BUN: 19 mg/dL (ref 8–23)
CO2: 28 mmol/L (ref 22–32)
Calcium: 9.5 mg/dL (ref 8.9–10.3)
Chloride: 96 mmol/L — ABNORMAL LOW (ref 98–111)
Creatinine, Ser: 1.46 mg/dL — ABNORMAL HIGH (ref 0.61–1.24)
GFR calc Af Amer: 50 mL/min — ABNORMAL LOW (ref >60)
GFR calc non Af Amer: 44 mL/min — ABNORMAL LOW (ref >60)
Glucose, Bld: 104 mg/dL — ABNORMAL HIGH (ref 70–99)
Potassium: 5.1 mmol/L (ref 3.5–5.1)
Sodium: 132 mmol/L — ABNORMAL LOW (ref 135–145)

## 2019-05-23 MED ORDER — APIXABAN 5 MG PO TABS: 5.0000 mg | ORAL_TABLET | Freq: Two times a day (BID) | ORAL | 6 refills | Status: DC

## 2019-05-23 MED ORDER — MECLIZINE HCL 25 MG PO TABS: 25.0000 mg | ORAL_TABLET | Freq: Three times a day (TID) | ORAL | 0 refills | Status: DC | PRN

## 2019-05-23 MED ORDER — CARVEDILOL 3.125 MG PO TABS: 3.1250 mg | ORAL_TABLET | Freq: Two times a day (BID) | ORAL | 6 refills | Status: DC

## 2019-05-23 MED ORDER — FUROSEMIDE 20 MG PO TABS: 20.0000 mg | ORAL_TABLET | ORAL | 0 refills | Status: DC | PRN

## 2019-05-23 MED FILL — MECLIZINE 25 MG TABLET: 25 | 10 days supply | Qty: 30 | Fill #0

## 2019-05-23 MED FILL — ELIQUIS 5 MG TABLET: 5 | 30 days supply | Qty: 60 | Fill #0

## 2019-05-23 MED FILL — CARVEDILOL 3.125 MG TABLET: 3.125 | 30 days supply | Qty: 60 | Fill #0

## 2019-05-23 MED FILL — FUROSEMIDE 20 MG TAB: 20 | 30 days supply | Qty: 30 | Fill #0

## 2019-05-23 NOTE — Discharge Instructions (Signed)

## 2019-05-23 NOTE — Progress Notes (Addendum)
Patient ID: Kristopher Fernandez, male   DOB: 1932/11/22, 85 y.o.   MRN: [id number]     Advanced Heart Failure Rounding Note  PCP-Cardiologist: No primary care provider on file.   Subjective:    Seen by Neuro on 5/30 and felt to have cerebellar stroke with dysmetria, diplopia, and vertigo. Repeat CT head no change. Carotid u/s 1-39% bilaterally. Vertebrals ok. CTA head/neck without large vessel occlusion or hemodynamically significant stenosis.  ASA/Plavix stopped and he was started on Eliquis with low EF.   He was orthostatic yesterday with cardiac rehab.  No dizziness this morning, SBP 100s-110s.   [employer] device interrogation: he has around 5% RV pacing.   [place] Procedural Findings (5/28): Hemodynamics (mmHg) RA mean 3 RV 38/3 PA 38/8, mean 18 PCWP mean 6 Oxygen saturations: PA 68% AO 97% Cardiac Output (Fick) 5.4  Cardiac Index (Fick) 2.68 PVR 2.2 WU   Objective:   Weight Range: 76.7 kg Body mass index is 22.93 kg/m.   Vital Signs:   Temp:  [97.4 F (36.3 C)-97.8 F (36.6 C)] 97.6 F (36.4 C) (06/03 0340) Pulse Rate:  [62-67] 67 (06/03 0340) Resp:  [15-20] 15 (06/03 0340) BP: (111-130)/(51-85) 111/51 (06/03 0340) SpO2:  [92 %-100 %] 94 % (06/03 0340) Weight:  [76.7 kg] 76.7 kg (06/03 0558) Last BM Date: 05/20/19  Weight change: Filed Weights   05/21/19 0405 05/22/19 0500 05/23/19 0558  Weight: 78 kg 76.6 kg 76.7 kg    Intake/Output:   Intake/Output Summary (Last 24 hours) at 05/23/2019 0742 Last data filed at 05/23/2019 0557 Gross per 24 hour  Intake 1360 ml  Output 1150 ml  Net 210 ml      Physical Exam    General: NAD Neck: No JVD, no thyromegaly or thyroid nodule.  Lungs: Clear to auscultation bilaterally with normal respiratory effort. CV: Nondisplaced PMI.  Heart regular S1/S2, no S3/S4, no murmur.  No peripheral edema.   Abdomen: Soft, nontender, no hepatosplenomegaly, no distention.  Skin: Intact without lesions or rashes.  Neurologic: Alert and  oriented x 3.  Psych: Normal affect. Extremities: No clubbing or cyanosis.  HEENT: Normal.    Telemetry   NSR 60s with a-pacing, no atrial fibrillation (personally reviewed)  Labs    CBC Recent Labs    05/21/19 0416 05/22/19 0302  WBC 4.9 4.1  NEUTROABS 3.3 2.7  HGB 12.3* 12.4*  HCT 36.7* 36.7*  MCV 85.5 85.0  PLT 128* 741*   Basic Metabolic Panel Recent Labs    05/21/19 0416 05/22/19 0302  NA 131* 131*  K 4.9 4.7  CL 98 96*  CO2 24 25  GLUCOSE 89 85  BUN 25* 24*  CREATININE 1.56* 1.47*  CALCIUM 8.7* 9.0   Liver Function Tests No results for input(s): AST, ALT, ALKPHOS, BILITOT, PROT, ALBUMIN in the last 72 hours. No results for input(s): LIPASE, AMYLASE in the last 72 hours. Cardiac Enzymes No results for input(s): CKTOTAL, CKMB, CKMBINDEX, TROPONINI in the last 72 hours.  BNP: BNP (last 3 results) Recent Labs    05/09/19 1034  BNP 1,462.0*    ProBNP (last 3 results) No results for input(s): PROBNP in the last 8760 hours.   D-Dimer No results for input(s): DDIMER in the last 72 hours. Hemoglobin A1C No results for input(s): HGBA1C in the last 72 hours. Fasting Lipid Panel No results for input(s): CHOL, HDL, LDLCALC, TRIG, CHOLHDL, LDLDIRECT in the last 72 hours. Thyroid Function Tests No results for input(s): TSH, T4TOTAL, T3FREE, THYROIDAB in  the last 72 hours.  Invalid input(s): FREET3  Other results:   Imaging    No results found.   Medications:     Scheduled Medications: . apixaban  5 mg Oral BID  . atorvastatin  80 mg Oral q1800  . carvedilol  3.125 mg Oral BID WC  . meclizine  25 mg Oral Q8H  . mometasone-formoterol  2 puff Inhalation BID  . ranolazine  500 mg Oral BID  . sodium chloride flush  3 mL Intravenous Q12H  . venlafaxine XR  75 mg Oral Q breakfast    Infusions: . sodium chloride      PRN Medications: sodium chloride, acetaminophen, magnesium hydroxide, ondansetron (ZOFRAN) IV, sodium chloride flush    Assessment/Plan   1. Acute on chronic systolic CHF: Ischemic cardiomyopathy.  Echo in 1/20 with EF 40-45%, but now down to 20-25% on echo from 5/20.  Patient was initially admitted at [facility] and transferred here due to concern for low output HF/need for inotropes. He was diuresed at [facility].  The cause of the fall in EF is uncertain, he had coronary angiogram at [facility] with stable anatomy (no intervention).  He paces his RV only 5% of the time (device interrogated).   RHC on 5/28 showed low filling pressures and good cardiac output.  The low BP at [facility], I think, was related to diuresis + Entresto use.  Currently, SBP in 100s-110s but he was orthostatic yesterday with cardiac rehab. We have started him on low doses of Coreg and losartan. He remains euvolemic on exam today, we have not given him any diuretic.   - I will continue him on Coreg 3.125 mg bid today but will stop losartan with ongoing orthostasis yesterday.    - No diuretic for now.  2. CAD: Admitted with chest pain and mild troponin elevation (no trend).  Suspect that the mild troponin elevation was demand ischemia from CHF.  He has now been diuresed.  Cath showed stable anatomy, no intervention. No chest pain.  - He was started on Eliquis per neurology due to CVA with low EF, ASA and Plavix stopped.   - Continue statin - Continue ranolazine.  3. CKD: Stage 3.  Creatinine lower at 1.47 yesterday, pending today.  4. Aortic stenosis: Mild to moderate on echo this admission.Will follow  5. Nausea/vomiting/dizziness: Suspect vertigo.  Also with dysmetria and diplopia on exam.  Seen by Neuro on 5/30 and felt to have cerebellar stroke, repeat CT head with no acute changes. Carotid u/s 1-39% bilaterally. Vertebrals ok.  No MRI due to PPM.  Had CTA head/neck without large vessel occlusion or stenosis. Exam is improved.  - Discussed with neurology, started on apixaban and stopped ASA/Plavix with low EF.  - He remains on meclizine.  6. Orthostatic  hypotension: Improved with IVF.  - Repeat orthostatics today.  - May end up needing low dose midodrine but for now, will stop losartan.  - Consider w/u for TTR at some point 7. Disposition: Home today with home health/PT.  Meds for home: ranolazine 500 mg bid, Eliquis 5 mg bid, Coreg 3.125 mg bid, atorvastatin 80 mg daily, meclizine 25 mg po bid prn vertigo. Followup with me in office in 10-14 days. Followup with Dr. [name] in neurology clinic.  Hold off on driving until I see him back.    Length of Stay: 6  Loralie Champagne, MD  05/23/2019, 7:42 AM  Advanced Heart Failure Team Pager [phone] (M-F; 7a - 4p)  Please contact [facility]  for night-coverage after hours (4p -7a ) and weekends on [email or web address]

## 2019-05-23 NOTE — TOC Transition Note (Signed)
Transition of Care [facility]) - CM/SW Discharge Note   Patient Details  Name: Kristopher Fernandez MRN: [id number] Date of Birth: 1932/11/13  [facility] CM/SW Contact:  Maryclare Labrador, RN Phone Number: 05/23/2019, 9:05 AM   Clinical Narrative:  Pt to discharge home today.  Pt will transport home via private vehicle driven by family.  TOC will provide discharge medications prior to discharge and inform attending if PA requiried and ongoing copay,  [facility] informed that pt will discharge home today and will retrieve HH/HF orders directly from epic.  CM signing off       Barriers to Discharge: Barriers Resolved   Patient Goals and CMS Choice        Discharge Placement                       Discharge Plan and Services                                     Social Determinants of Health (SDOH) Interventions     Readmission Risk Interventions Readmission Risk Prevention Plan 05/16/2019  Transportation Screening Complete  PCP or Specialist Appt within 5-7 Days Complete  Home Care Screening Complete  Medication Review (RN CM) Complete  Some recent data might be hidden

## 2019-05-23 NOTE — Evaluation (Signed)
Occupational Therapy Evaluation Patient Details Name: Kristopher Fernandez MRN: [id number] DOB: October 06, 1932 Today's Date: 05/23/2019    History of Present Illness Patient is an 85 y/o male presenting with SOB likely due to acute on chronic systolic CHF. PMH significant for MI, pacer HTN, DDD, CHF. S/p R heart cath on 05/17/2019. on 5/30 patient developed double vision and dizziness. CT head - no acute abnormality. Continued work-up.    Clinical Impression   Pt is functioning at a supervision level for safety. He will use a walker at home and has 24 hour supervision. Educated pt to watch for dizziness and in fall prevention. Pt agreeable to using his wife's old shower seat as well. Pt to go home today.    Follow Up Recommendations  No OT follow up    Equipment Recommendations  None recommended by OT    Recommendations for Other Services       Precautions / Restrictions Precautions Precautions: Fall Precaution Comments: watch BP - has been orthostatic Restrictions Weight Bearing Restrictions: No      Mobility Bed Mobility Overal bed mobility: Independent             General bed mobility comments: HOB flat  Transfers Overall transfer level: Needs assistance Equipment used: Rolling walker (2 wheeled) Transfers: Sit to/from Stand Sit to Stand: Supervision         General transfer comment: cues to monitor for dizziness/safety    Balance Overall balance assessment: Mild deficits observed, not formally tested   Sitting balance-Leahy Scale: Good       Standing balance-Leahy Scale: Fair Standing balance comment: can release walker in static standing                           ADL either performed or assessed with clinical judgement   ADL Overall ADL's : Needs assistance/impaired                                       General ADL Comments: supervision for safety due to hx of orthostasis     Vision Patient Visual Report: No change from  baseline       Perception     Praxis      Pertinent Vitals/Pain Pain Assessment: No/denies pain     Hand Dominance Right   Extremity/Trunk Assessment Upper Extremity Assessment Upper Extremity Assessment: Overall WFL for tasks assessed   Lower Extremity Assessment Lower Extremity Assessment: Defer to PT evaluation   Cervical / Trunk Assessment Cervical / Trunk Assessment: Normal   Communication Communication Communication: No difficulties   Cognition Arousal/Alertness: Awake/alert Behavior During Therapy: WFL for tasks assessed/performed Overall Cognitive Status: Within Functional Limits for tasks assessed                                     General Comments       Exercises     Shoulder Instructions      Home Living Family/patient expects to be discharged to:: Private residence Living Arrangements: Children Available Help at Discharge: Family;Friend(s) Type of Home: House Home Access: Stairs to enter [employer] of Steps: 2 Entrance Stairs-Rails: Can reach both Home Layout: One level     Bathroom Shower/Tub: Occupational psychologist: Handicapped height     Home Equipment: Environmental consultant -  2 wheels;Cane - single point;Bedside commode;Shower seat;Grab bars - toilet;Grab bars - tub/shower;Wheelchair - manual          Prior Functioning/Environment Level of Independence: Needs assistance  Gait / Transfers Assistance Needed: Ambulates with spc, drives. No falls reported in the last 12 months ADL's / [place] Needed: Independent with ADLs, assist from dtr with IADLs            OT Problem List:        OT Treatment/Interventions:      OT Goals(Current goals can be found in the care plan section) Acute Rehab OT Goals Patient Stated Goal: return home soon  OT Frequency:     Barriers to D/C:            Co-evaluation              AM-PAC OT "6 Clicks" Daily Activity     Outcome Measure Help from  another person eating meals?: None Help from another person taking care of personal grooming?: None Help from another person toileting, which includes using toliet, bedpan, or urinal?: None Help from another person bathing (including washing, rinsing, drying)?: None Help from another person to put on and taking off regular upper body clothing?: None Help from another person to put on and taking off regular lower body clothing?: None 6 Click Score: 24   End of Session Equipment Utilized During Treatment: Rolling walker;Gait belt  Activity Tolerance: Patient tolerated treatment well Patient left: in bed;with call bell/phone within reach  OT Visit Diagnosis: Muscle weakness (generalized) (M62.81)                Time: 7048-8891 OT Time Calculation (min): 12 min Charges:  OT General Charges $OT Visit: 1 Visit OT Evaluation $OT Eval Moderate Complexity: 1 Mod  Nestor Lewandowsky, OTR/L Acute Rehabilitation Services Pager: [phone] Office: [phone]  Malka So 05/23/2019, 10:01 AM

## 2019-05-23 NOTE — Progress Notes (Signed)
Came to ambulate one more time however daughter waiting and pt getting dressed. He sts he is still feeling slightly dizzy today but doesn't feel like he will faint. Briefly reviewed low sodium and daily wts. Also reminded him to get up slowly. [place] CES, ACSM 9:53 AM 05/23/2019

## 2019-05-23 NOTE — Telephone Encounter (Signed)
°  Due to current COVID 19 pandemic, our office is severely reducing in office visits until further notice, in order to minimize the risk to our patients and healthcare providers.    Called patient and scheduled a virtual visit with Dr. [name]  for 07/02/2019 . Patient verbalized understanding of the [email or web address] process and I have sent an e-mail to [email or web address]  with link and instructions as well as my name and our office number. Patient understands that they will receive a call from RN to update chart.   Pt understands that although there may be some limitations with this type of visit, we will take all precautions to reduce any security or privacy concerns.  Pt understands that this will be treated like an in office visit and we will file with pt's insurance, and there may be a patient responsible charge related to this service.

## 2019-05-24 ENCOUNTER — Telehealth (HOSPITAL_COMMUNITY): Payer: Self-pay

## 2019-05-24 DIAGNOSIS — I5032 Chronic diastolic (congestive) heart failure: Secondary | ICD-10-CM | POA: Diagnosis not present

## 2019-05-24 NOTE — Telephone Encounter (Signed)
1 Received call from [facility] asking if they should add Dr Oleh Genin protocol to the discharge orders received yesterday or keep orders as they were written at discharge?  2. Received call from [place] asking if Dr Aundra Dubin is in agreement with palliative care request by hospital at discharge yesterday?  Routed to NP

## 2019-05-24 NOTE — Telephone Encounter (Signed)
Yes add Dr Aundra Dubin protocol.  Yes Palliative Care can see.   Kristopher Fernandez 1:01 PM

## 2019-05-24 NOTE — Telephone Encounter (Signed)
Juliann Pulse at [facility] aware it is ok patient has palliative care orders.   Denese from [facility] aware to add [place] protocol to plan of care.

## 2019-05-25 ENCOUNTER — Telehealth: Payer: Self-pay | Admitting: Student

## 2019-05-25 NOTE — Telephone Encounter (Signed)
Spoke with daughter [name] to schedule the Palliative Consult, daughter stated that patient already had a nurse come out yesterday and the PT was there this morning.  I did explain to her the differences between Palliative and home health but she wanted me to call her back in a couple of weeks to see how he did with home health.  I will follow-up with her in 2 weeks.

## 2019-05-31 ENCOUNTER — Other Ambulatory Visit: Payer: Self-pay

## 2019-05-31 ENCOUNTER — Ambulatory Visit (HOSPITAL_COMMUNITY)
Admit: 2019-05-31 | Discharge: 2019-05-31 | Disposition: A | Discharge status: Home / Self Care | Payer: Medicare Other | Source: Ambulatory Visit | Attending: Cardiology | Admitting: Cardiology

## 2019-05-31 ENCOUNTER — Encounter (HOSPITAL_COMMUNITY): Payer: Self-pay | Admitting: Cardiology

## 2019-05-31 VITALS — BP 104/42 | HR 62 | Wt 173.4 lb

## 2019-05-31 DIAGNOSIS — J449 Chronic obstructive pulmonary disease, unspecified: Secondary | ICD-10-CM | POA: Insufficient documentation

## 2019-05-31 DIAGNOSIS — Z7901 Long term (current) use of anticoagulants: Secondary | ICD-10-CM | POA: Insufficient documentation

## 2019-05-31 DIAGNOSIS — I5022 Chronic systolic (congestive) heart failure: Secondary | ICD-10-CM | POA: Diagnosis not present

## 2019-05-31 DIAGNOSIS — E785 Hyperlipidemia, unspecified: Secondary | ICD-10-CM | POA: Insufficient documentation

## 2019-05-31 DIAGNOSIS — I130 Hypertensive heart and chronic kidney disease with heart failure and stage 1 through stage 4 chronic kidney disease, or unspecified chronic kidney disease: Secondary | ICD-10-CM | POA: Diagnosis not present

## 2019-05-31 DIAGNOSIS — Z955 Presence of coronary angioplasty implant and graft: Secondary | ICD-10-CM | POA: Insufficient documentation

## 2019-05-31 DIAGNOSIS — I252 Old myocardial infarction: Secondary | ICD-10-CM | POA: Insufficient documentation

## 2019-05-31 DIAGNOSIS — I255 Ischemic cardiomyopathy: Secondary | ICD-10-CM | POA: Diagnosis not present

## 2019-05-31 DIAGNOSIS — Z950 Presence of cardiac pacemaker: Secondary | ICD-10-CM | POA: Diagnosis not present

## 2019-05-31 DIAGNOSIS — Z87891 Personal history of nicotine dependence: Secondary | ICD-10-CM | POA: Insufficient documentation

## 2019-05-31 DIAGNOSIS — I350 Nonrheumatic aortic (valve) stenosis: Secondary | ICD-10-CM | POA: Diagnosis present

## 2019-05-31 DIAGNOSIS — I495 Sick sinus syndrome: Secondary | ICD-10-CM | POA: Insufficient documentation

## 2019-05-31 DIAGNOSIS — F329 Major depressive disorder, single episode, unspecified: Secondary | ICD-10-CM | POA: Insufficient documentation

## 2019-05-31 DIAGNOSIS — Z79899 Other long term (current) drug therapy: Secondary | ICD-10-CM | POA: Insufficient documentation

## 2019-05-31 DIAGNOSIS — Z7951 Long term (current) use of inhaled steroids: Secondary | ICD-10-CM | POA: Insufficient documentation

## 2019-05-31 DIAGNOSIS — N183 Chronic kidney disease, stage 3 (moderate): Secondary | ICD-10-CM | POA: Insufficient documentation

## 2019-05-31 DIAGNOSIS — I2510 Atherosclerotic heart disease of native coronary artery without angina pectoris: Secondary | ICD-10-CM | POA: Diagnosis not present

## 2019-05-31 LAB — CBC
HCT: 39.7 % (ref 39.0–52.0)
Hemoglobin: 13 g/dL (ref 13.0–17.0)
MCH: 28.8 pg (ref 26.0–34.0)
MCHC: 32.7 g/dL (ref 30.0–36.0)
MCV: 87.8 fL (ref 80.0–100.0)
Platelets: 157 10*3/uL (ref 150–400)
RBC: 4.52 MIL/uL (ref 4.22–5.81)
RDW: 13.7 % (ref 11.5–15.5)
WBC: 5.8 10*3/uL (ref 4.0–10.5)
nRBC: 0 % (ref 0.0–0.2)

## 2019-05-31 LAB — BASIC METABOLIC PANEL
Anion gap: 7 (ref 5–15)
BUN: 19 mg/dL (ref 8–23)
CO2: 25 mmol/L (ref 22–32)
Calcium: 9.3 mg/dL (ref 8.9–10.3)
Chloride: 103 mmol/L (ref 98–111)
Creatinine, Ser: 1.32 mg/dL — ABNORMAL HIGH (ref 0.61–1.24)
GFR calc Af Amer: 57 mL/min — ABNORMAL LOW (ref >60)
GFR calc non Af Amer: 49 mL/min — ABNORMAL LOW (ref >60)
Glucose, Bld: 121 mg/dL — ABNORMAL HIGH (ref 70–99)
Potassium: 4.2 mmol/L (ref 3.5–5.1)
Sodium: 135 mmol/L (ref 135–145)

## 2019-05-31 NOTE — Patient Instructions (Addendum)
NO medication changes today!  Labs today We will only contact you if something comes back abnormal or we need to make some changes. Otherwise no news is good news!  ECG done in office today  Your physician recommends that you schedule a follow-up appointment on: Tuesday, July 28th, 2020 at 9:20a  PARKING CODE: [facility], you and your health needs are our priority. As part of our continuing mission to provide you with exceptional heart care, we have created designated Provider Care Teams. These Care Teams include your primary Cardiologist (physician) and Advanced Practice Providers (APPs- Physician Assistants and Nurse Practitioners) who all work together to provide you with the care you need, when you need it.   You may see any of the following providers on your designated Care Team at your next follow up: Marland Kitchen Dr Glori Bickers . Dr Loralie Champagne . Darrick Grinder, NP

## 2019-05-31 NOTE — Progress Notes (Signed)
PCP: Medicine, [facility] Family Cardiology: Dr. Saralyn Pilar  HF Cardiology: Dr. Aundra Dubin  85 y.o. with history of CAD s/p multiple PCIs, ischemic cardiomyopathy, sick sinus syndrome s/p PPM, and CKD stage 3 returns for followup of CHF and lightheadedness.   Patient has a long history of CAD detailed in the [place] below.  He also has an ischemic cardiomyopathy, with echo in 1/20 showing EF 40-45%. He had a Medtronic pacemaker placed for bradycardia in 2012. He was admitted with somewhat atypical chest pain in 5/20, TnI was mildly elevated at 0.08, with serial troponins there was no trend (maximal 0.09).  He underwent left heart cath, showing stable disease with no intervention planned (chronic occlusion of mid LCx and 85% ostial PDA).  His echo showed that EF was lower than prior at 20-25%.  He was thought to be volume overloaded.  He was diuresed with IV Lasix, and Coreg + Entresto were started.  Patient's BP fell significantly with initiation of cardiac meds and he had orthostatic symptoms.  All his cardiac meds had to be stopped.  There was concern that he may have low output HF and may require inotropic support, so he was transferred to [facility].  RHC at [facility] showed low filling pressures and normal cardiac output.  He continued to have dizziness and also cerebellar signs.  He was seen by neurology and suspected to have a cerebellar CVA.  CTA head showed multiple old small vessel infarcts, no MRI done due to PPM.  Eliquis was started based on neurology recommendation given low EF and CVA.  He gradually improved and was discharged home.   He returns today for followup.  He is on Coreg 3.125 mg bid and seems to be tolerating it reasonably well. BP is 104/42 today but he is not lightheaded.  BP was 131/73 earlier today at home.  He is walking with a cane.  No significant exertional dyspnea. No chest pain.  Able to do all ADLs.  Wants to get back to driving. He is getting home PT.   ECG (personally reviewed):  a-paced, LVH with repolarization abnormality, PVCs.   Labs (6/20): K 5.1, creatinine 1.46, LDL 75  Past Medical History: 1. Low back pain/degenerative disc disease.  2. Sick sinus syndrome: Dual chamber Medtronic PPM placed 8/12.  3. Aortic stenosis: Mild to moderate on last echo.  4. CAD: Extensive history.  - 7/06: Inferior MI with Cypher DES to prox and distal RCA.  - 07/20/05: Taxus DES to mLAD.  - 1/11: Xience DES to OM1 - 4/15: Overlapping Xience DES x 2 to ostial and proximal RCA.  - LHC (5/20): Nonobstructive disease in LAD, occluded mid LCx (chronic), 85% ostial PDA.  No intervention.  5. Chronic systolic CHF: Ischemic cardiomyopathy.   - Echo (1/20) with EF 40-45%.  - Echo (5/20) with EF 20-25%, normal RV, mild-moderate MR, mild-moderate AS with mean gradient 11 mmHg and AVA 1.47 cm^2.  - RHC (5/20): mean RA 3, PA 38/8 mean 18, mean PCWP 6, CI 2.68.  6. CKD: Stage 3.  7. COPD: Remote smoking. 8. HTN 9. Hyperlipidemia 10. Depression 11. CVA: Suspected cerebellar CVA in 5/20.  Eliquis started.  - CTA head (unable to do MRI with PPM): <50% carotid stenosis bilaterally.  Multiple old small vessel infarcts.   Social History   Socioeconomic History  . Marital status: Widowed    Spouse name: Not on file  . Number of children: 1  . Years of education: Not on file  .  Highest education level: Not on file  Occupational History  . Occupation: retired  Scientific laboratory technician  . Financial resource strain: Not very hard  . Food insecurity    Worry: Never true    Inability: Never true  . Transportation needs    Medical: No    Non-medical: No  Tobacco Use  . Smoking status: Former Smoker    Years: 10.00    Types: Cigarettes    Quit date: 1977    Years since quitting: 43.4  . Smokeless tobacco: Former Network engineer and Sexual Activity  . Alcohol use: No  . Drug use: No  . Sexual activity: Not on file  Lifestyle  . Physical activity    Days per week: Not on file    Minutes per  session: Not on file  . Stress: Only a little  Relationships  . Social connections    Talks on phone: More than three times a week    Gets together: Not on file    Attends religious service: Not on file    Active member of club or organization: Not on file    Attends meetings of clubs or organizations: Not on file    Relationship status: Widowed  . Intimate partner violence    Fear of current or ex partner: No    Emotionally abused: No    Physically abused: Not on file    Forced sexual activity: Not on file  Other Topics Concern  . Not on file  Social History Narrative  . Not on file   Family History  Problem Relation Age of Onset  . Bladder Cancer Neg Hx   . Kidney cancer Neg Hx   . Prostate cancer Neg Hx    ROS: All systems reviewed and negative except as per HPI.   Current Outpatient Medications  Medication Sig Dispense Refill  . apixaban (ELIQUIS) 5 MG TABS tablet Take 1 tablet (5 mg total) by mouth 2 (two) times daily. 60 tablet 6  . atorvastatin (LIPITOR) 80 MG tablet Take 80 mg by mouth daily.    . budesonide-formoterol (SYMBICORT) 160-4.5 MCG/ACT inhaler Inhale 2 puffs into the lungs 2 (two) times daily. 1 Inhaler 12  . carvedilol (COREG) 3.125 MG tablet Take 1 tablet (3.125 mg total) by mouth 2 (two) times daily with a meal. 60 tablet 6  . furosemide (LASIX) 20 MG tablet Take 1 tablet (20 mg total) by mouth as needed (weight. Take one tablet if weight increases by 3 pounds overnight or 5 pounds in one week.). 30 tablet 0  . meclizine (ANTIVERT) 25 MG tablet Take 1 tablet (25 mg total) by mouth 3 (three) times daily as needed for dizziness. 30 tablet 0  . ranolazine (RANEXA) 500 MG 12 hr tablet Take 500 mg by mouth 2 (two) times daily.    Marland Kitchen venlafaxine XR (EFFEXOR-XR) 75 MG 24 hr capsule Take 1 capsule (75 mg total) by mouth daily with breakfast. 30 capsule 0   No current facility-administered medications for this encounter.      BP (!) 104/42   Pulse 62   Wt 78.7  kg (173 lb 6.4 oz)   SpO2 93%   BMI 23.52 kg/m  General: NAD Neck: No JVD, no thyromegaly or thyroid nodule.  Lungs: Clear to auscultation bilaterally with normal respiratory effort. CV: Nondisplaced PMI.  Heart regular S1/S2, no S3/S4, 2/6 early SEM RUSB.  No peripheral edema.  No carotid bruit.  Normal pedal pulses.  Abdomen: Soft, nontender, no  hepatosplenomegaly, no distention.  Skin: Intact without lesions or rashes.  Neurologic: Alert and oriented x 3.  Psych: Normal affect. Extremities: No clubbing or cyanosis.  HEENT: Normal.   Assessment/Plan: 1. Chronic systolic CHF: Ischemic cardiomyopathy. Echo in 1/20 with EF 40-45%, but now down to 20-25% on echo from 5/20. The cause of the fall in EF is uncertain, he had coronary angiogram at [facility] in 5/20 with stable anatomy (no intervention).  He has been pacing his RV only 5% of the time when device was interrogated.  RHC in 5/20 showed low filling pressures and good cardiac output.  BP tends to run low with orthostatic hypotension when cardiac meds are added.  NYHA class II symptoms currently, feels good.  Not volume overloaded on exam.  - Continue Coreg 3.125 mg bid.    - He does not need a diuretic.  - Would hold off on ACEI/ARB/ARNI given orthostasis/soft BP.  - May try to add spironolactone 12.5 mg daily in the future if pressure remains stable.  - Given prominent orthostatic hypotension, would be reasonable to workup for cardiac amyloidosis.  Cannot get MRI with PPM, will discuss PYP scan at followup appt.  2. CAD: Multiple interventions in the past.  Cath in 5/20 showed stable anatomy, no intervention. No chest pain.  - He was started on Eliquis per neurology due to CVA with low EF, now off ASA and Plavix.   - Continue atorvastatin 80 mg daily, good lipids in 6/20.  - Continue ranolazine.  3. CKD: Stage 3. BMET today.   4. Aortic stenosis: Mild to moderate on echo in 5/20.  5. Suspected cerebellar CVA: In 5/20.   Dizziness/?vertigo with dysmetria and diplopia on exam.  Seen by neurology and felt to have cerebellar stroke, repeat CT head with no acute changes (old small vessel infarcts). Carotid u/s 1-39% bilaterally. Vertebrals ok.  No MRI due to PPM.  Had CTA head/neck without large vessel occlusion or stenosis.  - He was started on apixaban and stopped ASA/Plavix with low EF.  CBC today.   Followup 5-6 weeks.  I think that he can go back to driving during the day and locally.   Loralie Champagne 05/31/2019

## 2019-06-13 ENCOUNTER — Telehealth: Payer: Self-pay | Admitting: Student

## 2019-06-13 NOTE — Telephone Encounter (Signed)
Called daughter back to follow-up to see if they wanted to pursue Palliative services.  Daughter stated that patient was doing fine and is still receiving PT home health services for probably another week.  Daughter stated that she didn't fell that Palliative services were needed at this time.  I asked if she wanted to cancel the referral or put it on home, and she said to cancel.  I told her that I would and I would notify Dr. Claris Gladden office and let them know, she was in agreement with this.

## 2019-07-02 ENCOUNTER — Encounter: Payer: Self-pay | Admitting: Neurology

## 2019-07-02 ENCOUNTER — Telehealth: Payer: Self-pay | Admitting: Neurology

## 2019-07-02 ENCOUNTER — Ambulatory Visit (INDEPENDENT_AMBULATORY_CARE_PROVIDER_SITE_OTHER): Payer: Medicare Other | Admitting: Neurology

## 2019-07-02 ENCOUNTER — Other Ambulatory Visit: Payer: Self-pay

## 2019-07-02 VITALS — BP 109/66 | HR 65 | Temp 98.4°F | Ht 72.0 in | Wt 174.0 lb

## 2019-07-02 DIAGNOSIS — G459 Transient cerebral ischemic attack, unspecified: Secondary | ICD-10-CM | POA: Diagnosis not present

## 2019-07-02 NOTE — Telephone Encounter (Signed)
Patients daughter wants to know if the patient can come in to the office today instead of VV. Per Dr. [name] this is ok. Please call patient and advise if the patient is eligible.

## 2019-07-02 NOTE — Progress Notes (Signed)
[facility] [address] [place]. [place] [address] [phone]       OFFICE CONSULT NOTE  [name] Date of Birth:  08-25-1932 Medical Record Number:  [id number]   Referring MD: Rosalin Hawking Reason for Referral: Stroke  HPI: [name] is a pleasant 85 year old Caucasian male seen today for initial office consultation visit for stroke.  He is accompanied by his daughter today.  History is obtained from them, review of electronic medical records and have personally reviewed imaging films in PACS.  He was admitted to [facility] on 05/17/2019 with shortness of breath and chest pain and found to be in acute heart failure with fluid overload and LV dysfunction.  His ejection fraction had dropped from a previous 40% to 20% during the current admission.  Patient had complained of feeling dizzy and off-balance and his blood pressure been running between systolic 46-270.  He started developing double vision as well as having nausea, incoordination left upper extremity.  CT scan of the head did not show definite stroke.  MRI could not be obtained as he had pacemaker.  He was transferred to [facility] and a CT angiogram of the brain and neck were obtained which did not show any large vessel occlusion but 50% carotid bifurcation stenosis which was not confirmed on the carotid ultrasound.  2D echo showed ejection fraction of 20 to 25%.  LDL cholesterol was 75 mg percent and hemoglobin A1c was 6.0.  Patient was recommended Eliquis given the low ejection fraction and is tolerated well so far without bruising or bleeding.  He feels all his symptoms cleared after a day from transfer to [facility].  He has had no recurrent stroke or TIA symptoms.  His blood pressure still running low and today it is 109/66.  However he feels his dizziness is much improved.  His double vision and dysmetria have not recurred.  He is quite independent and manages all activities  of daily living by himself.  He has learned that if he gets up slowly and avoid sudden movements he is not as dizzy.  He has now not had previous strokes, TIAs seizures or any other significant neurological problems.  He states his memory is fine and he jokes that he can remember things better than his granddaughter at times.  ROS:   14 system review of systems is positive for dizziness, imbalance, hearing loss only and all other systems negative  PMH:  Past Medical History:  Diagnosis Date   Anginal pain ([place])    Aortic stenosis 07/03/2014   Overview:  Mild with calculated aortic valve area of 1.05cm2   Basal cell carcinoma, ear 03/31/2015   Bradycardia 07/03/2014   CHF (congestive heart failure) (HCC)    DDD (degenerative disc disease), lumbar 08/06/2015   H/O cardiac catheterization 07/03/2014   Overview:  Cypher stent proximal and distal RCA 07/18/05 and TAXUS stent mid LAD 07/20/05 at [facility]   HTN (hypertension) 07/03/2014   Hyperlipidemia 07/03/2014   Lumbosacral radiculopathy at S1 08/22/2015   MI (myocardial infarction) ([place]) 07/03/2014   Overview:  Mi 07/17/05   Myocardial infarction ([place])    Normocytic normochromic anemia 09/14/2015   Pacemaker 07/03/2014   Overview:  Dual chamber pacemaker 08/11/11   Shortness of breath dyspnea    SOB (shortness of breath) on exertion 05/13/2016    Social History:  Social History   Socioeconomic History   Marital status: Widowed    Spouse name: Not  on file   Number of children: 1   Years of education: Not on file   Highest education level: Not on file  Occupational History   Occupation: retired  Scientist, product/process development strain: Not very hard   Food insecurity    Worry: Never true    Inability: Never true   Transportation needs    Medical: No    Non-medical: No  Tobacco Use   Smoking status: Former Smoker    Years: 10.00    Types: Cigarettes    Quit date: 1977    Years since quitting: 43.5    Smokeless tobacco: Former Network engineer and Sexual Activity   Alcohol use: No   Drug use: No   Sexual activity: Not on file  Lifestyle   Physical activity    Days per week: Not on file    Minutes per session: Not on file   Stress: Only a little  Relationships   Social connections    Talks on phone: More than three times a week    Gets together: Not on file    Attends religious service: Not on file    Active member of club or organization: Not on file    Attends meetings of clubs or organizations: Not on file    Relationship status: Widowed   Intimate partner violence    Fear of current or ex partner: No    Emotionally abused: No    Physically abused: Not on file    Forced sexual activity: Not on file  Other Topics Concern   Not on file  Social History Narrative   Not on file    Medications:   Current Outpatient Medications on File Prior to Visit  Medication Sig Dispense Refill   apixaban (ELIQUIS) 5 MG TABS tablet Take 1 tablet (5 mg total) by mouth 2 (two) times daily. 60 tablet 6   atorvastatin (LIPITOR) 80 MG tablet Take 80 mg by mouth daily.     budesonide-formoterol (SYMBICORT) 160-4.5 MCG/ACT inhaler Inhale 2 puffs into the lungs 2 (two) times daily. 1 Inhaler 12   carvedilol (COREG) 3.125 MG tablet Take 1 tablet (3.125 mg total) by mouth 2 (two) times daily with a meal. 60 tablet 6   furosemide (LASIX) 20 MG tablet Take 1 tablet (20 mg total) by mouth as needed (weight. Take one tablet if weight increases by 3 pounds overnight or 5 pounds in one week.). 30 tablet 0   meclizine (ANTIVERT) 25 MG tablet Take 1 tablet (25 mg total) by mouth 3 (three) times daily as needed for dizziness. 30 tablet 0   ranolazine (RANEXA) 500 MG 12 hr tablet Take 500 mg by mouth 2 (two) times daily.     venlafaxine XR (EFFEXOR-XR) 75 MG 24 hr capsule Take 1 capsule (75 mg total) by mouth daily with breakfast. 30 capsule 0   No current facility-administered medications on  file prior to visit.     Allergies:  No Known Allergies  Physical Exam General: well developed, well nourished elderly Caucasian male, seated, in no evident distress Head: head normocephalic and atraumatic.   Neck: supple with no carotid or supraclavicular bruits Cardiovascular: regular rate and rhythm, no murmurs Musculoskeletal: no deformity.  Mild kyphoscoliosis Skin:  no rash/petichiae Vascular:  Normal pulses all extremities  Neurologic Exam Mental Status: Awake and fully alert. Oriented to place and time. Recent and remote memory intact. Attention span, concentration and fund of knowledge appropriate. Mood and affect appropriate.  Cranial Nerves: Fundoscopic exam reveals sharp disc margins. Pupils equal, briskly reactive to light. Extraocular movements full without nystagmus. Visual fields full to confrontation. Hearing mildly diminished bilaterally. Facial sensation intact. Face, tongue, palate moves normally and symmetrically.  Motor: Normal bulk and tone. Normal strength in all tested extremity muscles. Sensory.: intact to touch , pinprick , position and vibratory sensation.  Coordination: Rapid alternating movements normal in all extremities. Finger-to-nose and heel-to-shin performed accurately bilaterally. Gait and Station: Arises from chair without difficulty. Stance is normal. Gait demonstrates normal stride length and balance . Able to heel, toe and tandem walk with moderate difficulty.  Reflexes: 1+ and symmetric. Toes downgoing.   NIHSS  0 Modified Rankin  1  ASSESSMENT: 85 year old Caucasian male with transient episode of diplopia, dizziness and left upper extremity dysmetria in the setting of acute CHF and cardiomyopathy possibly brainstem TIA versus small infarct not visualized on imaging in May 2020 who is doing well.  Vascular risk factors of cardiomyopathy, hypertension, hyperlipidemia and age    PLAN: I had a long d/w patient and his daughter about his recent  brainstem TIA/ small stroke, cardiomyopathy,risk for recurrent stroke/TIAs, personally independently reviewed imaging studies and stroke evaluation results and answered questions.Continue   Eliquis (apixaban) daily  for secondary stroke prevention and maintain strict control of hypertension with blood pressure goal below 130/90, diabetes with hemoglobin A1c goal below 6.5% and lipids with LDL cholesterol goal below 70 mg/dL. I also advised the patient to eat a healthy diet with plenty of whole grains, cereals, fruits and vegetables, exercise regularly and maintain ideal body weight .  He was advised to follow-up with his cardiologist Dr. Aundra Dubin for follow-up echocardiogram and if his ejection fraction is greater than 35% he may switch Eliquis back to Plavix.  No further stroke related testing is indicated at the present time.  I have advised him to get up slowly and avoid sudden quick movements and we discussed fall and safety precautions.  Greater than 50% time during this 45-minute consultation visit was spent on counseling and coordination of care about cardiomyopathy and stroke and TIA risk and discussion about prevention and answering questions followup in the future with me in the future in 3 months or call earlier if necessary. Antony Contras, MD  [facility] [address] [place], [place] [address]  Phone [phone] Fax [phone] Note: This document was prepared with digital dictation and possible smart phrase technology. Any transcriptional errors that result from this process are unintentional.

## 2019-07-02 NOTE — Patient Instructions (Signed)
I had a long d/w patient and his daughter about his recent brainstem TIA/ small stroke, risk for recurrent stroke/TIAs, personally independently reviewed imaging studies and stroke evaluation results and answered questions.Continue   Eliquis (apixaban) daily  for secondary stroke prevention and maintain strict control of hypertension with blood pressure goal below 130/90, diabetes with hemoglobin A1c goal below 6.5% and lipids with LDL cholesterol goal below 70 mg/dL. I also advised the patient to eat a healthy diet with plenty of whole grains, cereals, fruits and vegetables, exercise regularly and maintain ideal body weight .  He was advised to follow-up with his cardiologist Dr. Aundra Dubin for follow-up echocardiogram and if his ejection fraction is greater than 35% he may switch Eliquis back to Plavix.  No further stroke related testing is indicated at the present time.  I have advised him to get up slowly and avoid sudden quick movements and we discussed fall and safety precautions.  Followup in the future with me in the future in 3 months or call earlier if necessary.   Stroke Prevention Some medical conditions and behaviors are associated with a higher chance of having a stroke. You can help prevent a stroke by making nutrition, lifestyle, and other changes, including managing any medical conditions you may have. What nutrition changes can be made?   Eat healthy foods. You can do this by: ? Choosing foods high in fiber, such as fresh fruits and vegetables and whole grains. ? Eating at least 5 or more servings of fruits and vegetables a day. Try to fill half of your plate at each meal with fruits and vegetables. ? Choosing lean protein foods, such as lean cuts of meat, poultry without skin, fish, tofu, beans, and nuts. ? Eating low-fat dairy products. ? Avoiding foods that are high in salt (sodium). This can help lower blood pressure. ? Avoiding foods that have saturated fat, trans fat, and  cholesterol. This can help prevent high cholesterol. ? Avoiding processed and premade foods.  Follow your health care provider's specific guidelines for losing weight, controlling high blood pressure (hypertension), lowering high cholesterol, and managing diabetes. These may include: ? Reducing your daily calorie intake. ? Limiting your daily sodium intake to 1,500 milligrams (mg). ? Using only healthy fats for cooking, such as olive oil, canola oil, or sunflower oil. ? Counting your daily carbohydrate intake. What lifestyle changes can be made?  Maintain a healthy weight. Talk to your health care provider about your ideal weight.  Get at least 30 minutes of moderate physical activity at least 5 days a week. Moderate activity includes brisk walking, biking, and swimming.  Do not use any products that contain nicotine or tobacco, such as cigarettes and e-cigarettes. If you need help quitting, ask your health care provider. It may also be helpful to avoid exposure to secondhand smoke.  Limit alcohol intake to no more than 1 drink a day for nonpregnant women and 2 drinks a day for men. One drink equals 12 oz of beer, 5 oz of wine, or 1 oz of hard liquor.  Stop any illegal drug use.  Avoid taking birth control pills. Talk to your health care provider about the risks of taking birth control pills if: ? You are over 79 years old. ? You smoke. ? You get migraines. ? You have ever had a blood clot. What other changes can be made?  Manage your cholesterol levels. ? Eating a healthy diet is important for preventing high cholesterol. If cholesterol cannot be managed  through diet alone, you may also need to take medicines. ? Take any prescribed medicines to control your cholesterol as told by your health care provider.  Manage your diabetes. ? Eating a healthy diet and exercising regularly are important parts of managing your blood sugar. If your blood sugar cannot be managed through diet and  exercise, you may need to take medicines. ? Take any prescribed medicines to control your diabetes as told by your health care provider.  Control your hypertension. ? To reduce your risk of stroke, try to keep your blood pressure below 130/80. ? Eating a healthy diet and exercising regularly are an important part of controlling your blood pressure. If your blood pressure cannot be managed through diet and exercise, you may need to take medicines. ? Take any prescribed medicines to control hypertension as told by your health care provider. ? Ask your health care provider if you should monitor your blood pressure at home. ? Have your blood pressure checked every year, even if your blood pressure is normal. Blood pressure increases with age and some medical conditions.  Get evaluated for sleep disorders (sleep apnea). Talk to your health care provider about getting a sleep evaluation if you snore a lot or have excessive sleepiness.  Take over-the-counter and prescription medicines only as told by your health care provider. Aspirin or blood thinners (antiplatelets or anticoagulants) may be recommended to reduce your risk of forming blood clots that can lead to stroke.  Make sure that any other medical conditions you have, such as atrial fibrillation or atherosclerosis, are managed. What are the warning signs of a stroke? The warning signs of a stroke can be easily remembered as BEFAST.  B is for balance. Signs include: ? Dizziness. ? Loss of balance or coordination. ? Sudden trouble walking.  E is for eyes. Signs include: ? A sudden change in vision. ? Trouble seeing.  F is for face. Signs include: ? Sudden weakness or numbness of the face. ? The face or eyelid drooping to one side.  A is for arms. Signs include: ? Sudden weakness or numbness of the arm, usually on one side of the body.  S is for speech. Signs include: ? Trouble speaking (aphasia). ? Trouble understanding.  T is for  time. ? These symptoms may represent a serious problem that is an emergency. Do not wait to see if the symptoms will go away. Get medical help right away. Call your local emergency services (911 in the [place] Do not drive yourself to the hospital.  Other signs of stroke may include: ? A sudden, severe headache with no known cause. ? Nausea or vomiting. ? Seizure. Where to find more information For more information, visit:  American Stroke Association: www.strokeassociation.org  National Stroke Association: www.stroke.org Summary  You can prevent a stroke by eating healthy, exercising, not smoking, limiting alcohol intake, and managing any medical conditions you may have.  Do not use any products that contain nicotine or tobacco, such as cigarettes and e-cigarettes. If you need help quitting, ask your health care provider. It may also be helpful to avoid exposure to secondhand smoke.  Remember BEFAST for warning signs of stroke. Get help right away if you or a loved one has any of these signs. This information is not intended to replace advice given to you by your health care provider. Make sure you discuss any questions you have with your health care provider. Document Released: 01/13/2005 Document Revised: 11/18/2017 Document Reviewed: 01/11/2017 Elsevier Patient  Education  [employer].

## 2019-07-02 NOTE — Telephone Encounter (Signed)
I called Kristopher Fernandez that appt can be change to in office visit. I ask if they have been exposed to COVID 19 or been tested in the last 14 days. She stated they both have not been exposed or been tested. I discuss the check in process. I explain mask is required at all times and to check in 30 minutes early.She verbalized understanding.

## 2019-07-06 ENCOUNTER — Telehealth (HOSPITAL_COMMUNITY): Payer: Self-pay

## 2019-07-06 NOTE — Telephone Encounter (Signed)
HH orders faxed to [facility]. Confirmation received.

## 2019-07-17 ENCOUNTER — Other Ambulatory Visit: Payer: Self-pay

## 2019-07-17 ENCOUNTER — Encounter (HOSPITAL_COMMUNITY): Payer: Self-pay | Admitting: Cardiology

## 2019-07-17 ENCOUNTER — Ambulatory Visit (HOSPITAL_COMMUNITY)
Admission: RE | Admit: 2019-07-17 | Discharge: 2019-07-17 | Disposition: A | Discharge status: Home / Self Care | Payer: Medicare Other | Source: Ambulatory Visit | Attending: Cardiology | Admitting: Cardiology

## 2019-07-17 VITALS — BP 108/72 | HR 68 | Wt 177.6 lb

## 2019-07-17 DIAGNOSIS — Z87891 Personal history of nicotine dependence: Secondary | ICD-10-CM | POA: Insufficient documentation

## 2019-07-17 DIAGNOSIS — I130 Hypertensive heart and chronic kidney disease with heart failure and stage 1 through stage 4 chronic kidney disease, or unspecified chronic kidney disease: Secondary | ICD-10-CM | POA: Diagnosis not present

## 2019-07-17 DIAGNOSIS — I252 Old myocardial infarction: Secondary | ICD-10-CM | POA: Diagnosis not present

## 2019-07-17 DIAGNOSIS — Z8673 Personal history of transient ischemic attack (TIA), and cerebral infarction without residual deficits: Secondary | ICD-10-CM | POA: Insufficient documentation

## 2019-07-17 DIAGNOSIS — I255 Ischemic cardiomyopathy: Secondary | ICD-10-CM | POA: Insufficient documentation

## 2019-07-17 DIAGNOSIS — I2510 Atherosclerotic heart disease of native coronary artery without angina pectoris: Secondary | ICD-10-CM | POA: Diagnosis not present

## 2019-07-17 DIAGNOSIS — I5022 Chronic systolic (congestive) heart failure: Secondary | ICD-10-CM | POA: Diagnosis not present

## 2019-07-17 DIAGNOSIS — J449 Chronic obstructive pulmonary disease, unspecified: Secondary | ICD-10-CM | POA: Insufficient documentation

## 2019-07-17 DIAGNOSIS — N183 Chronic kidney disease, stage 3 (moderate): Secondary | ICD-10-CM | POA: Diagnosis not present

## 2019-07-17 DIAGNOSIS — Z7951 Long term (current) use of inhaled steroids: Secondary | ICD-10-CM | POA: Diagnosis not present

## 2019-07-17 DIAGNOSIS — I350 Nonrheumatic aortic (valve) stenosis: Secondary | ICD-10-CM | POA: Diagnosis not present

## 2019-07-17 DIAGNOSIS — E785 Hyperlipidemia, unspecified: Secondary | ICD-10-CM | POA: Insufficient documentation

## 2019-07-17 DIAGNOSIS — Z79899 Other long term (current) drug therapy: Secondary | ICD-10-CM | POA: Insufficient documentation

## 2019-07-17 DIAGNOSIS — Z7901 Long term (current) use of anticoagulants: Secondary | ICD-10-CM | POA: Insufficient documentation

## 2019-07-17 MED ORDER — SPIRONOLACTONE 25 MG PO TABS: 12.5000 mg | ORAL_TABLET | Freq: Every day | ORAL | 3 refills | Status: DC

## 2019-07-17 NOTE — Patient Instructions (Addendum)
START Spironolactone 12.5mg  (1/2 tab) every night  Please have labs done in 10 days at [facility] We will only contact you if something comes back abnormal or we need to make some changes. Otherwise no news is good news!  You have been ordered a PYP Scan.  This is done in the Radiology Department of [facility].  When you come for this test please plan to be there 2-3 hours.  Your physician recommends that you schedule a follow-up appointment on September 29th at 10:20a.  At the [facility], you and your health needs are our priority. As part of our continuing mission to provide you with exceptional heart care, we have created designated Provider Care Teams. These Care Teams include your primary Cardiologist (physician) and Advanced Practice Providers (APPs- Physician Assistants and Nurse Practitioners) who all work together to provide you with the care you need, when you need it.   You may see any of the following providers on your designated Care Team at your next follow up: Marland Kitchen Dr Glori Bickers . Dr Loralie Champagne . Darrick Grinder, NP

## 2019-07-17 NOTE — Progress Notes (Signed)
PCP: Tracie Harrier, MD Cardiology: Dr. Saralyn Pilar  HF Cardiology: Dr. Aundra Dubin  85 y.o. with history of CAD s/p multiple PCIs, ischemic cardiomyopathy, sick sinus syndrome s/p PPM, and CKD stage 3 returns for followup of CHF and lightheadedness.   Patient has a long history of CAD detailed in the [facility] below.  He also has an ischemic cardiomyopathy, with echo in 1/20 showing EF 40-45%. He had a Medtronic pacemaker placed for bradycardia in 2012. He was admitted with somewhat atypical chest pain in 5/20, TnI was mildly elevated at 0.08, with serial troponins there was no trend (maximal 0.09).  He underwent left heart cath, showing stable disease with no intervention planned (chronic occlusion of mid LCx and 85% ostial PDA).  His echo showed that EF was lower than prior at 20-25%.  He was thought to be volume overloaded.  He was diuresed with IV Lasix, and Coreg + Entresto were started.  Patient's BP fell significantly with initiation of cardiac meds and he had orthostatic symptoms.  All his cardiac meds had to be stopped.  There was concern that he may have low output HF and may require inotropic support, so he was transferred to [facility].  RHC at [facility] showed low filling pressures and normal cardiac output.  He continued to have dizziness and also cerebellar signs.  He was seen by neurology and suspected to have a cerebellar CVA.  CTA head showed multiple old small vessel infarcts, no MRI done due to PPM.  Eliquis was started based on neurology recommendation given low EF and CVA.  He gradually improved and was discharged home.   He returns today for followup.  He is on Coreg 3.125 mg bid and seems to be tolerating it reasonably well.  No lightheadedness with standing.  He is back to driving and doing all his usual activities.  No dyspnea walking on flat ground.  He has completed home PT.  No orthopnea/PND.  He uses oxygen as needed (COPD history).   Labs (6/20): K 5.1 => 4.2, creatinine 1.46 => 1.33, LDL  75  Past Medical History: 1. Low back pain/degenerative disc disease.  2. Sick sinus syndrome: Dual chamber Medtronic PPM placed 8/12.  3. Aortic stenosis: Mild to moderate on last echo.  4. CAD: Extensive history.  - 7/06: Inferior MI with Cypher DES to prox and distal RCA.  - 07/20/05: Taxus DES to mLAD.  - 1/11: Xience DES to OM1 - 4/15: Overlapping Xience DES x 2 to ostial and proximal RCA.  - LHC (5/20): Nonobstructive disease in LAD, occluded mid LCx (chronic), 85% ostial PDA.  No intervention.  5. Chronic systolic CHF: Ischemic cardiomyopathy.   - Echo (1/20) with EF 40-45%.  - Echo (5/20) with EF 20-25%, normal RV, mild-moderate MR, mild-moderate AS with mean gradient 11 mmHg and AVA 1.47 cm^2.  - RHC (5/20): mean RA 3, PA 38/8 mean 18, mean PCWP 6, CI 2.68.  6. CKD: Stage 3.  7. COPD: Remote smoking. 8. HTN 9. Hyperlipidemia 10. Depression 11. CVA: Suspected cerebellar CVA in 5/20.  Eliquis started.  - CTA head (unable to do MRI with PPM): <50% carotid stenosis bilaterally.  Multiple old small vessel infarcts.   Social History   Socioeconomic History  . Marital status: Widowed    Spouse name: Not on file  . Number of children: 1  . Years of education: Not on file  . Highest education level: Not on file  Occupational History  . Occupation: retired  Scientific laboratory technician  .  Financial resource strain: Not very hard  . Food insecurity    Worry: Never true    Inability: Never true  . Transportation needs    Medical: No    Non-medical: No  Tobacco Use  . Smoking status: Former Smoker    Years: 10.00    Types: Cigarettes    Quit date: 1977    Years since quitting: 43.6  . Smokeless tobacco: Former Network engineer and Sexual Activity  . Alcohol use: No  . Drug use: No  . Sexual activity: Not on file  Lifestyle  . Physical activity    Days per week: Not on file    Minutes per session: Not on file  . Stress: Only a little  Relationships  . Social connections    Talks  on phone: More than three times a week    Gets together: Not on file    Attends religious service: Not on file    Active member of club or organization: Not on file    Attends meetings of clubs or organizations: Not on file    Relationship status: Widowed  . Intimate partner violence    Fear of current or ex partner: No    Emotionally abused: No    Physically abused: Not on file    Forced sexual activity: Not on file  Other Topics Concern  . Not on file  Social History Narrative  . Not on file   Family History  Problem Relation Age of Onset  . Bladder Cancer Neg Hx   . Kidney cancer Neg Hx   . Prostate cancer Neg Hx    ROS: All systems reviewed and negative except as per HPI.   Current Outpatient Medications  Medication Sig Dispense Refill  . apixaban (ELIQUIS) 5 MG TABS tablet Take 1 tablet (5 mg total) by mouth 2 (two) times daily. 60 tablet 6  . atorvastatin (LIPITOR) 80 MG tablet Take 80 mg by mouth daily.    . budesonide-formoterol (SYMBICORT) 160-4.5 MCG/ACT inhaler Inhale 2 puffs into the lungs 2 (two) times daily. 1 Inhaler 12  . carvedilol (COREG) 3.125 MG tablet Take 1 tablet (3.125 mg total) by mouth 2 (two) times daily with a meal. 60 tablet 6  . furosemide (LASIX) 20 MG tablet Take 1 tablet (20 mg total) by mouth as needed (weight. Take one tablet if weight increases by 3 pounds overnight or 5 pounds in one week.). 30 tablet 0  . meclizine (ANTIVERT) 25 MG tablet Take 1 tablet (25 mg total) by mouth 3 (three) times daily as needed for dizziness. 30 tablet 0  . ranolazine (RANEXA) 500 MG 12 hr tablet Take 500 mg by mouth 2 (two) times daily.    Marland Kitchen venlafaxine XR (EFFEXOR-XR) 75 MG 24 hr capsule Take 1 capsule (75 mg total) by mouth daily with breakfast. 30 capsule 0  . spironolactone (ALDACTONE) 25 MG tablet Take 0.5 tablets (12.5 mg total) by mouth at bedtime. 45 tablet 3   No current facility-administered medications for this encounter.      BP 108/72   Pulse 68    Wt 80.6 kg (177 lb 9.6 oz)   SpO2 94%   BMI 24.09 kg/m  General: NAD Neck: No JVD, no thyromegaly or thyroid nodule.  Lungs: Distant BS CV: Nondisplaced PMI.  Heart regular S1/S2, no S3/S4, 2/6 SEM RUSB with clear S2.  No peripheral edema.  No carotid bruit.  Normal pedal pulses.  Abdomen: Soft, nontender, no hepatosplenomegaly, no distention.  Skin: Intact without lesions or rashes.  Neurologic: Alert and oriented x 3.  Psych: Normal affect. Extremities: No clubbing or cyanosis.  HEENT: Normal.    Assessment/Plan: 1. Chronic systolic CHF: Ischemic cardiomyopathy. Echo in 1/20 with EF 40-45%, but now down to 20-25% on echo from 5/20. The cause of the fall in EF is uncertain, he had coronary angiogram at [facility] in 5/20 with stable anatomy (no intervention).  He has been pacing his RV only 5% of the time when device was interrogated.  RHC in 5/20 showed low filling pressures and good cardiac output.  BP tends to run low with orthostatic hypotension when cardiac meds are added.  NYHA class II symptoms currently, feels good.  Not volume overloaded on exam.  - Continue Coreg 3.125 mg bid.    - He does not need a diuretic.  - Would hold off on ACEI/ARB/ARNI given orthostasis/soft BP.  - Add spironolactone 12.5 mg daily, take in the evening before bed to limit effect on BP.  BMET in 10 days.  - Given prominent orthostatic hypotension, would be reasonable to workup for cardiac amyloidosis.  Cannot get MRI with PPM, I will arrange for a PYP scan and will check myeloma panel/urine immunofixation.   2. CAD: Multiple interventions in the past.  Cath in 5/20 showed stable anatomy, no intervention. No chest pain.  - He was started on Eliquis per neurology due to CVA with low EF, now off ASA and Plavix.   - Continue atorvastatin 80 mg daily, good lipids in 6/20.  - Continue ranolazine.  3. CKD: Stage 3.    4. Aortic stenosis: Mild to moderate on echo in 5/20.  5. Suspected cerebellar CVA: In 5/20.   Dizziness/?vertigo with dysmetria and diplopia on exam.  Seen by neurology and felt to have cerebellar stroke, repeat CT head with no acute changes (old small vessel infarcts). Carotid u/s 1-39% bilaterally. Vertebrals ok.  No MRI due to PPM.  Had CTA head/neck without large vessel occlusion or stenosis.  - He was started on apixaban and stopped ASA/Plavix with low EF.  He will have a repeat echo in 3 months.  If EF > 35%, he can stop Eliquis and go back on ASA/Plavix.    Followup in 2 months.   Loralie Champagne 07/17/2019

## 2019-09-18 ENCOUNTER — Other Ambulatory Visit: Payer: Self-pay

## 2019-09-18 ENCOUNTER — Encounter (HOSPITAL_COMMUNITY): Payer: Self-pay | Admitting: Cardiology

## 2019-09-18 ENCOUNTER — Ambulatory Visit (HOSPITAL_COMMUNITY)
Admission: RE | Admit: 2019-09-18 | Discharge: 2019-09-18 | Disposition: A | Discharge status: Home / Self Care | Payer: Medicare Other | Source: Ambulatory Visit | Attending: Cardiology | Admitting: Cardiology

## 2019-09-18 VITALS — BP 115/59 | HR 64 | Wt 178.6 lb

## 2019-09-18 DIAGNOSIS — I255 Ischemic cardiomyopathy: Secondary | ICD-10-CM | POA: Diagnosis not present

## 2019-09-18 DIAGNOSIS — I130 Hypertensive heart and chronic kidney disease with heart failure and stage 1 through stage 4 chronic kidney disease, or unspecified chronic kidney disease: Secondary | ICD-10-CM | POA: Diagnosis present

## 2019-09-18 DIAGNOSIS — N183 Chronic kidney disease, stage 3 (moderate): Secondary | ICD-10-CM | POA: Diagnosis not present

## 2019-09-18 DIAGNOSIS — Z79899 Other long term (current) drug therapy: Secondary | ICD-10-CM | POA: Insufficient documentation

## 2019-09-18 DIAGNOSIS — Z8673 Personal history of transient ischemic attack (TIA), and cerebral infarction without residual deficits: Secondary | ICD-10-CM | POA: Diagnosis not present

## 2019-09-18 DIAGNOSIS — Z7902 Long term (current) use of antithrombotics/antiplatelets: Secondary | ICD-10-CM | POA: Diagnosis not present

## 2019-09-18 DIAGNOSIS — I252 Old myocardial infarction: Secondary | ICD-10-CM | POA: Insufficient documentation

## 2019-09-18 DIAGNOSIS — J449 Chronic obstructive pulmonary disease, unspecified: Secondary | ICD-10-CM | POA: Diagnosis not present

## 2019-09-18 DIAGNOSIS — I5022 Chronic systolic (congestive) heart failure: Secondary | ICD-10-CM

## 2019-09-18 DIAGNOSIS — I2510 Atherosclerotic heart disease of native coronary artery without angina pectoris: Secondary | ICD-10-CM | POA: Diagnosis not present

## 2019-09-18 DIAGNOSIS — Z7901 Long term (current) use of anticoagulants: Secondary | ICD-10-CM | POA: Diagnosis not present

## 2019-09-18 DIAGNOSIS — Z7951 Long term (current) use of inhaled steroids: Secondary | ICD-10-CM | POA: Diagnosis not present

## 2019-09-18 DIAGNOSIS — I350 Nonrheumatic aortic (valve) stenosis: Secondary | ICD-10-CM

## 2019-09-18 DIAGNOSIS — F329 Major depressive disorder, single episode, unspecified: Secondary | ICD-10-CM | POA: Diagnosis not present

## 2019-09-18 DIAGNOSIS — I495 Sick sinus syndrome: Secondary | ICD-10-CM | POA: Diagnosis not present

## 2019-09-18 DIAGNOSIS — E785 Hyperlipidemia, unspecified: Secondary | ICD-10-CM | POA: Diagnosis not present

## 2019-09-18 DIAGNOSIS — F17210 Nicotine dependence, cigarettes, uncomplicated: Secondary | ICD-10-CM | POA: Insufficient documentation

## 2019-09-18 LAB — BASIC METABOLIC PANEL
Anion gap: 7 (ref 5–15)
BUN: 27 mg/dL — ABNORMAL HIGH (ref 8–23)
CO2: 27 mmol/L (ref 22–32)
Calcium: 9.1 mg/dL (ref 8.9–10.3)
Chloride: 102 mmol/L (ref 98–111)
Creatinine, Ser: 1.57 mg/dL — ABNORMAL HIGH (ref 0.61–1.24)
GFR calc Af Amer: 46 mL/min — ABNORMAL LOW (ref >60)
GFR calc non Af Amer: 40 mL/min — ABNORMAL LOW (ref >60)
Glucose, Bld: 125 mg/dL — ABNORMAL HIGH (ref 70–99)
Potassium: 5.1 mmol/L (ref 3.5–5.1)
Sodium: 136 mmol/L (ref 135–145)

## 2019-09-18 LAB — CBC
HCT: 40.9 % (ref 39.0–52.0)
Hemoglobin: 13.2 g/dL (ref 13.0–17.0)
MCH: 30.1 pg (ref 26.0–34.0)
MCHC: 32.3 g/dL (ref 30.0–36.0)
MCV: 93.2 fL (ref 80.0–100.0)
Platelets: 145 10*3/uL — ABNORMAL LOW (ref 150–400)
RBC: 4.39 MIL/uL (ref 4.22–5.81)
RDW: 14 % (ref 11.5–15.5)
WBC: 7.1 10*3/uL (ref 4.0–10.5)
nRBC: 0 % (ref 0.0–0.2)

## 2019-09-18 MED ORDER — SPIRONOLACTONE 25 MG PO TABS: 25.0000 mg | ORAL_TABLET | Freq: Every day | ORAL | 3 refills | Status: DC

## 2019-09-18 NOTE — Patient Instructions (Signed)
Labs done today. We will contact you only if your labs are abnormal.  INCREASE Spirolactone 25mg (1 tab) by mouth once daily.  Please continue all other medications as prescribed.  Your physician recommends that you schedule a follow-up appointment in: 3-4 months with an echo prior to your appointment. We will contact you to schedule this appointment.  In 2 weeks he would like for you to have additional lab work done(Rx given to patient in office to have labs done at [facility]).   Your physician has requested that you have an echocardiogram. Echocardiography is a painless test that uses sound waves to create images of your heart. It provides your doctor with information about the size and shape of your heart and how well your heart's chambers and valves are working. This procedure takes approximately one hour. There are no restrictions for this procedure.  You have been ordered a PYP Scan.  This is done in the Radiology Department of [facility].  When you come for this test please plan to be there 2-3 hours. Someone will contact you about scheduling an appointment.   At the [facility], you and your health needs are our priority. As part of our continuing mission to provide you with exceptional heart care, we have created designated Provider Care Teams. These Care Teams include your primary Cardiologist (physician) and Advanced Practice Providers (APPs- Physician Assistants and Nurse Practitioners) who all work together to provide you with the care you need, when you need it.   You may see any of the following providers on your designated Care Team at your next follow up: Marland Kitchen Dr Glori Bickers . Dr Loralie Champagne . Darrick Grinder, NP   Please be sure to bring in all your medications bottles to every appointment.

## 2019-09-18 NOTE — Progress Notes (Signed)
PCP: Tracie Harrier, MD Cardiology: Dr. Saralyn Pilar  HF Cardiology: Dr. Aundra Dubin  85 y.o. with history of CAD s/p multiple PCIs, ischemic cardiomyopathy, sick sinus syndrome s/p PPM, and CKD stage 3 returns for followup of CHF and lightheadedness.   Patient has a long history of CAD detailed in the [place] below.  He also has an ischemic cardiomyopathy, with echo in 1/20 showing EF 40-45%. He had a Medtronic pacemaker placed for bradycardia in 2012. He was admitted with somewhat atypical chest pain in 5/20, TnI was mildly elevated at 0.08, with serial troponins there was no trend (maximal 0.09).  He underwent left heart cath, showing stable disease with no intervention planned (chronic occlusion of mid LCx and 85% ostial PDA).  His echo showed that EF was lower than prior at 20-25%.  He was thought to be volume overloaded.  He was diuresed with IV Lasix, and Coreg + Entresto were started.  Patient's BP fell significantly with initiation of cardiac meds and he had orthostatic symptoms.  All his cardiac meds had to be stopped.  There was concern that he may have low output HF and may require inotropic support, so he was transferred to [facility].  RHC at [facility] showed low filling pressures and normal cardiac output.  He continued to have dizziness and also cerebellar signs.  He was seen by neurology and suspected to have a cerebellar CVA.  CTA head showed multiple old small vessel infarcts, no MRI done due to PPM.  Eliquis was started based on neurology recommendation given low EF and CVA.  He gradually improved and was discharged home.   He returns today for followup of CHF.  He is on Coreg 3.125 mg bid and spironolactone 12.5 mg daily and seems to be tolerating these meds reasonably well.  No further lightheadedness with standing.  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  He does yardwork without significant dyspnea.  No falls.  No chest pain.  No orthopnea/PND. Weight stable.   Labs (6/20):  K 5.1 => 4.2, creatinine 1.46 => 1.33, LDL 75  ECG (personally reviewed): NSR alternating with a-pacing, PVCs, LVH.   Past Medical History: 1. Low back pain/degenerative disc disease.  2. Sick sinus syndrome: Dual chamber Medtronic PPM placed 8/12.  3. Aortic stenosis: Mild to moderate on last echo.  4. CAD: Extensive history.  - 7/06: Inferior MI with Cypher DES to prox and distal RCA.  - 07/20/05: Taxus DES to mLAD.  - 1/11: Xience DES to OM1 - 4/15: Overlapping Xience DES x 2 to ostial and proximal RCA.  - LHC (5/20): Nonobstructive disease in LAD, occluded mid LCx (chronic), 85% ostial PDA.  No intervention.  5. Chronic systolic CHF: Ischemic cardiomyopathy.   - Echo (1/20) with EF 40-45%.  - Echo (5/20) with EF 20-25%, normal RV, mild-moderate MR, mild-moderate AS with mean gradient 11 mmHg and AVA 1.47 cm^2.  - RHC (5/20): mean RA 3, PA 38/8 mean 18, mean PCWP 6, CI 2.68.  6. CKD: Stage 3.  7. COPD: Remote smoking. 8. HTN 9. Hyperlipidemia 10. Depression 11. CVA: Suspected cerebellar CVA in 5/20.  Eliquis started.  - CTA head (unable to do MRI with PPM): <50% carotid stenosis bilaterally.  Multiple old small vessel infarcts.   Social History   Socioeconomic History  . Marital status: Widowed    Spouse name: Not on file  . Number of children: 1  . Years of education: Not on file  .  Highest education level: Not on file  Occupational History  . Occupation: retired  Scientific laboratory technician  . Financial resource strain: Not very hard  . Food insecurity    Worry: Never true    Inability: Never true  . Transportation needs    Medical: No    Non-medical: No  Tobacco Use  . Smoking status: Former Smoker    Years: 10.00    Types: Cigarettes    Quit date: 1977    Years since quitting: 43.7  . Smokeless tobacco: Former Network engineer and Sexual Activity  . Alcohol use: No  . Drug use: No  . Sexual activity: Not on file  Lifestyle  . Physical activity    Days per week: Not on  file    Minutes per session: Not on file  . Stress: Only a little  Relationships  . Social connections    Talks on phone: More than three times a week    Gets together: Not on file    Attends religious service: Not on file    Active member of club or organization: Not on file    Attends meetings of clubs or organizations: Not on file    Relationship status: Widowed  . Intimate partner violence    Fear of current or ex partner: No    Emotionally abused: No    Physically abused: Not on file    Forced sexual activity: Not on file  Other Topics Concern  . Not on file  Social History Narrative  . Not on file   Family History  Problem Relation Age of Onset  . Bladder Cancer Neg Hx   . Kidney cancer Neg Hx   . Prostate cancer Neg Hx    ROS: All systems reviewed and negative except as per HPI.   Current Outpatient Medications  Medication Sig Dispense Refill  . apixaban (ELIQUIS) 5 MG TABS tablet Take 1 tablet (5 mg total) by mouth 2 (two) times daily. 60 tablet 6  . atorvastatin (LIPITOR) 80 MG tablet Take 80 mg by mouth daily.    . budesonide-formoterol (SYMBICORT) 160-4.5 MCG/ACT inhaler Inhale 2 puffs into the lungs 2 (two) times daily. 1 Inhaler 12  . carvedilol (COREG) 3.125 MG tablet Take 1 tablet (3.125 mg total) by mouth 2 (two) times daily with a meal. 60 tablet 6  . furosemide (LASIX) 20 MG tablet Take 1 tablet (20 mg total) by mouth as needed (weight. Take one tablet if weight increases by 3 pounds overnight or 5 pounds in one week.). 30 tablet 0  . meclizine (ANTIVERT) 25 MG tablet Take 1 tablet (25 mg total) by mouth 3 (three) times daily as needed for dizziness. 30 tablet 0  . ranolazine (RANEXA) 500 MG 12 hr tablet Take 500 mg by mouth 2 (two) times daily.    Marland Kitchen spironolactone (ALDACTONE) 25 MG tablet Take 1 tablet (25 mg total) by mouth at bedtime. 90 tablet 3  . venlafaxine XR (EFFEXOR-XR) 75 MG 24 hr capsule Take 1 capsule (75 mg total) by mouth daily with breakfast.  30 capsule 0   No current facility-administered medications for this encounter.      BP (!) 115/59   Pulse 64   Wt 81 kg (178 lb 9.6 oz)   SpO2 97%   BMI 24.22 kg/m  General: NAD Neck: No JVD, no thyromegaly or thyroid nodule.  Lungs: Clear to auscultation bilaterally with normal respiratory effort. CV: Nondisplaced PMI.  Heart regular S1/S2, no S3/S4, 2/6  SEM RUSB with clear S2.  No peripheral edema.  No carotid bruit.  Normal pedal pulses.  Abdomen: Soft, nontender, no hepatosplenomegaly, no distention.  Skin: Intact without lesions or rashes.  Neurologic: Alert and oriented x 3.  Psych: Normal affect. Extremities: No clubbing or cyanosis.  HEENT: Normal.    Assessment/Plan: 1. Chronic systolic CHF: Ischemic cardiomyopathy. Echo in 1/20 with EF 40-45%, but now down to 20-25% on echo from 5/20. The cause of the fall in EF is uncertain, he had coronary angiogram at [facility] in 5/20 with stable anatomy (no intervention).  He was been pacing his RV only 5% of the time when device was interrogated.  RHC in 5/20 showed low filling pressures and good cardiac output.  BP has tended to run low with orthostatic hypotension when cardiac meds are added but doing well on current regimen.  NYHA class II symptoms currently, feels good.  Not volume overloaded.  - Continue Coreg 3.125 mg bid.    - He does not need a diuretic.  - Would hold off on ACEI/ARB/ARNI given orthostasis/soft BP.  - Increase spironolactone to 25 mg daily.  BMET today and again in 10 days.   - Given history of prominent orthostatic hypotension, would be reasonable to workup for cardiac amyloidosis.  Cannot get MRI with PPM, I will arrange for PYP scan to assess for ATTR amyloidosis. Will also send myeloma panel.  2. CAD: Multiple interventions in the past.  Cath in 5/20 showed stable anatomy, no intervention. No chest pain.  - He was started on Eliquis per neurology due to CVA with low EF, now off ASA and Plavix.   - Continue  atorvastatin 80 mg daily, good lipids in 6/20.  - Continue ranolazine.  3. CKD: Stage 3. BMET today.  4. Aortic stenosis: Mild to moderate on echo in 5/20.  5. Suspected cerebellar CVA: In 5/20.  Dizziness/?vertigo with dysmetria and diplopia on exam.  Seen by neurology and felt to have cerebellar stroke, repeat CT head with no acute changes (old small vessel infarcts). Carotid u/s 1-39% bilaterally. Vertebrals ok.  No MRI due to PPM.  Had CTA head/neck without large vessel occlusion or stenosis.  - He was started on apixaban and stopped ASA/Plavix with low EF.  Repeat echo at followup in 3 months.  If EF > 35%, he can stop Eliquis and go back on ASA/Plavix.    Followup in 3 months with echo.   Loralie Champagne 09/18/2019

## 2019-09-19 ENCOUNTER — Telehealth (HOSPITAL_COMMUNITY): Payer: Self-pay

## 2019-09-19 DIAGNOSIS — I5022 Chronic systolic (congestive) heart failure: Secondary | ICD-10-CM

## 2019-09-19 NOTE — Telephone Encounter (Signed)
-----   Message from Larey Dresser, MD sent at 09/18/2019  4:18 PM EDT ----- We increased his spironolactone today but K is 5.1.  Make sure he follows a low K diet and is not on K supplement.  Would repeat BMET on Friday, if K is higher will have to stop spironolactone.

## 2019-09-19 NOTE — Telephone Encounter (Signed)
Pt aware of lab results.  Pt is not on any k supplements and he was advised follow low k.  Verbalized understanding and will repeat labs on Friday.

## 2019-09-21 ENCOUNTER — Ambulatory Visit (HOSPITAL_COMMUNITY)
Admission: RE | Admit: 2019-09-21 | Discharge: 2019-09-21 | Disposition: A | Discharge status: Home / Self Care | Payer: Medicare Other | Source: Ambulatory Visit | Attending: Cardiology | Admitting: Cardiology

## 2019-09-21 ENCOUNTER — Other Ambulatory Visit: Payer: Self-pay

## 2019-09-21 DIAGNOSIS — I5022 Chronic systolic (congestive) heart failure: Secondary | ICD-10-CM | POA: Diagnosis not present

## 2019-09-21 LAB — BASIC METABOLIC PANEL
Anion gap: 9 (ref 5–15)
BUN: 18 mg/dL (ref 8–23)
CO2: 24 mmol/L (ref 22–32)
Calcium: 9.2 mg/dL (ref 8.9–10.3)
Chloride: 103 mmol/L (ref 98–111)
Creatinine, Ser: 1.39 mg/dL — ABNORMAL HIGH (ref 0.61–1.24)
GFR calc Af Amer: 53 mL/min — ABNORMAL LOW (ref >60)
GFR calc non Af Amer: 46 mL/min — ABNORMAL LOW (ref >60)
Glucose, Bld: 111 mg/dL — ABNORMAL HIGH (ref 70–99)
Potassium: 4.6 mmol/L (ref 3.5–5.1)
Sodium: 136 mmol/L (ref 135–145)

## 2019-10-08 ENCOUNTER — Ambulatory Visit (INDEPENDENT_AMBULATORY_CARE_PROVIDER_SITE_OTHER): Payer: Medicare Other | Admitting: Neurology

## 2019-10-08 ENCOUNTER — Other Ambulatory Visit: Payer: Self-pay

## 2019-10-08 ENCOUNTER — Encounter: Payer: Self-pay | Admitting: Neurology

## 2019-10-08 VITALS — BP 104/65 | HR 75 | Temp 97.8°F | Wt 181.0 lb

## 2019-10-08 DIAGNOSIS — I6300 Cerebral infarction due to thrombosis of unspecified precerebral artery: Secondary | ICD-10-CM

## 2019-10-08 NOTE — Progress Notes (Signed)
[facility] [address] [place]. [name] [address] [phone]       OFFICE FOLLOW UP VISIT NOTE  [name] Date of Birth:  Jun 25, 1932 Medical Record Number:  [id number]   Referring MD: Rosalin Hawking Reason for Referral: Stroke  HPI: Initial consult 07/02/2019 :[name] is a pleasant 85 year old Caucasian male seen today for initial office consultation visit for stroke.  He is accompanied by his daughter today.  History is obtained from them, review of electronic medical records and have personally reviewed imaging films in PACS.  He was admitted to [facility] on 05/17/2019 with shortness of breath and chest pain and found to be in acute heart failure with fluid overload and LV dysfunction.  His ejection fraction had dropped from a previous 40% to 20% during the current admission.  Patient had complained of feeling dizzy and off-balance and his blood pressure been running between systolic [id number].  He started developing double vision as well as having nausea, incoordination left upper extremity.  CT scan of the head did not show definite stroke.  MRI could not be obtained as he had pacemaker.  He was transferred to [facility] and a CT angiogram of the brain and neck were obtained which did not show any large vessel occlusion but 50% carotid bifurcation stenosis which was not confirmed on the carotid ultrasound.  2D echo showed ejection fraction of 20 to 25%.  LDL cholesterol was 75 mg percent and hemoglobin A1c was 6.0.  Patient was recommended Eliquis given the low ejection fraction and is tolerated well so far without bruising or bleeding.  He feels all his symptoms cleared after a day from transfer to [facility].  He has had no recurrent stroke or TIA symptoms.  His blood pressure still running low and today it is 109/66.  However he feels his dizziness is much improved.  His double vision and dysmetria have not recurred.  He is quite  independent and manages all activities of daily living by himself.  He has learned that if he gets up slowly and avoid sudden movements he is not as dizzy.  He has now not had previous strokes, TIAs seizures or any other significant neurological problems.  He states his memory is fine and he jokes that he can remember things better than his granddaughter at times. Update 10/08/2019 : He returns for follow-up after last visit 3 months ago.  He states he is doing well.  He has had no further episodes of stroke or TIA.  His dizziness and balance have recovered completely back to normal.  He has had no episodes of double vision or incoordination.  He states his blood pressure tends to run on the low side and today it is 106/65.  He has noted improvement in his orthostatic symptoms.  He is tolerating Lipitor 80 mg well without side effects.  He remains on Eliquis as well as is tolerating it well without bleeding but does get bruise easily.  He was supposed to get a follow-up echocardiogram done 3 months after his admission for stroke but it has not yet been done.  He does have an upcoming appointment to follow-up with Dr. Aundra Dubin his cardiologist. ROS:   14 system review of systems is positive for dizziness, imbalance, hearing loss only and all other systems negative  PMH:  Past Medical History:  Diagnosis Date   Anginal pain ([place])    Aortic stenosis 07/03/2014   Overview:  Mild with calculated aortic valve area of 1.05cm2   Basal cell carcinoma, ear 03/31/2015   Bradycardia 07/03/2014   CHF (congestive heart failure) (HCC)    DDD (degenerative disc disease), lumbar 08/06/2015   H/O cardiac catheterization 07/03/2014   Overview:  Cypher stent proximal and distal RCA 07/18/05 and TAXUS stent mid LAD 07/20/05 at [facility]   HTN (hypertension) 07/03/2014   Hyperlipidemia 07/03/2014   Lumbosacral radiculopathy at S1 08/22/2015   MI (myocardial infarction) ([place]) 07/03/2014   Overview:  Mi 07/17/05    Myocardial infarction ([place])    Normocytic normochromic anemia 09/14/2015   Pacemaker 07/03/2014   Overview:  Dual chamber pacemaker 08/11/11   Shortness of breath dyspnea    SOB (shortness of breath) on exertion 05/13/2016    Social History:  Social History   Socioeconomic History   Marital status: Widowed    Spouse name: Not on file   Number of children: 1   Years of education: Not on file   Highest education level: Not on file  Occupational History   Occupation: retired  Scientist, product/process development strain: Not very hard   Food insecurity    Worry: Never true    Inability: Never true   Transportation needs    Medical: No    Non-medical: No  Tobacco Use   Smoking status: Former Smoker    Years: 10.00    Types: Cigarettes    Quit date: 1977    Years since quitting: 43.8   Smokeless tobacco: Former Network engineer and Sexual Activity   Alcohol use: No   Drug use: No   Sexual activity: Not on file  Lifestyle   Physical activity    Days per week: Not on file    Minutes per session: Not on file   Stress: Only a little  Relationships   Social connections    Talks on phone: More than three times a week    Gets together: Not on file    Attends religious service: Not on file    Active member of club or organization: Not on file    Attends meetings of clubs or organizations: Not on file    Relationship status: Widowed   Intimate partner violence    Fear of current or ex partner: No    Emotionally abused: No    Physically abused: Not on file    Forced sexual activity: Not on file  Other Topics Concern   Not on file  Social History Narrative   Not on file    Medications:   Current Outpatient Medications on File Prior to Visit  Medication Sig Dispense Refill   apixaban (ELIQUIS) 5 MG TABS tablet Take 1 tablet (5 mg total) by mouth 2 (two) times daily. 60 tablet 6   atorvastatin (LIPITOR) 80 MG tablet Take 80 mg by mouth daily.      budesonide-formoterol (SYMBICORT) 160-4.5 MCG/ACT inhaler Inhale 2 puffs into the lungs 2 (two) times daily. 1 Inhaler 12   carvedilol (COREG) 3.125 MG tablet Take 1 tablet (3.125 mg total) by mouth 2 (two) times daily with a meal. 60 tablet 6   furosemide (LASIX) 20 MG tablet Take 1 tablet (20 mg total) by mouth as needed (weight. Take one tablet if weight increases by 3 pounds overnight or 5 pounds in one week.). 30 tablet 0   meclizine (ANTIVERT) 25 MG tablet Take 1 tablet (25 mg total) by mouth 3 (three) times daily as needed for dizziness. [place]  tablet 0   ranolazine (RANEXA) 500 MG 12 hr tablet Take 500 mg by mouth 2 (two) times daily.     spironolactone (ALDACTONE) 25 MG tablet Take 1 tablet (25 mg total) by mouth at bedtime. 90 tablet 3   venlafaxine XR (EFFEXOR-XR) 75 MG 24 hr capsule Take 1 capsule (75 mg total) by mouth daily with breakfast. 30 capsule 0   No current facility-administered medications on file prior to visit.     Allergies:  No Known Allergies  Physical Exam General: well developed, well nourished elderly Caucasian male, seated, in no evident distress Head: head normocephalic and atraumatic.   Neck: supple with no carotid or supraclavicular bruits Cardiovascular: regular rate and rhythm, no murmurs Musculoskeletal: no deformity.  Mild kyphoscoliosis Skin:  no rash/petichiae Vascular:  Normal pulses all extremities  Neurologic Exam Mental Status: Awake and fully alert. Oriented to place and time. Recent and remote memory intact. Attention span, concentration and fund of knowledge appropriate. Mood and affect appropriate.  Cranial Nerves: Fundoscopic exam reveals sharp disc margins. Pupils equal, briskly reactive to light. Extraocular movements full without nystagmus. Visual fields full to confrontation. Hearing mildly diminished bilaterally. Facial sensation intact. Face, tongue, palate moves normally and symmetrically.  Motor: Normal bulk and tone. Normal  strength in all tested extremity muscles. Sensory.: intact to touch , pinprick , position and vibratory sensation.  Coordination: Rapid alternating movements normal in all extremities. Finger-to-nose and heel-to-shin performed accurately bilaterally. Gait and Station: Arises from chair without difficulty. Stance is normal. Gait demonstrates normal stride length and balance . Able to heel, toe and tandem walk with moderate difficulty.  Reflexes: 1+ and symmetric. Toes downgoing.   NIHSS  0 Modified [place]in  1  ASSESSMENT: 85 year old Caucasian male with transient episode of diplopia, dizziness and left upper extremity dysmetria in the setting of acute CHF and cardiomyopathy possibly brainstem TIA versus small infarct not visualized on imaging in May 2020 who is doing well.  Vascular risk factors of cardiomyopathy, hypertension, hyperlipidemia and age    PLAN: I had a long d/w patient about his recent brainstem TIA/ small stroke, cardiomyopathy,risk for recurrent stroke/TIAs, personally independently reviewed imaging studies and stroke evaluation results and answered questions.Continue   Eliquis (apixaban) daily  for secondary stroke prevention and maintain strict control of hypertension with blood pressure goal below 130/90, diabetes with hemoglobin A1c goal below 6.5% and lipids with LDL cholesterol goal below 70 mg/dL. I also advised the patient to eat a healthy diet with plenty of whole grains, cereals, fruits and vegetables, exercise regularly and maintain ideal body weight .  He was advised to follow-up with his cardiologist Dr. Aundra Dubin for follow-up echocardiogram and if his ejection fraction is greater than 35% he may switch Eliquis back to aspirin/Plavix.    Follow-up transthoracic echocardiogram study was ordered today.  He will return for follow-up in the future in 6 months with my nurse practitioner Janett Billow or call earlier if necessary.Greater than 50% time during this 25-minute  visit was  spent on counseling and coordination of care about cardiomyopathy and stroke and TIA risk and discussion about prevention and answering questions followup in the future with me in the future in 3 months or call earlier if necessary. Antony Contras, MD  [facility] [address] [place], [place] [address]  Phone [phone] Fax [phone] Note: This document was prepared with digital dictation and possible smart phrase technology. Any transcriptional errors that result from this process are unintentional.

## 2019-10-08 NOTE — Patient Instructions (Signed)
I had a long d/w patient about his recent brainstem TIA/ small stroke, cardiomyopathy,risk for recurrent stroke/TIAs, personally independently reviewed imaging studies and stroke evaluation results and answered questions.Continue   Eliquis (apixaban) daily  for secondary stroke prevention and maintain strict control of hypertension with blood pressure goal below 130/90, diabetes with hemoglobin A1c goal below 6.5% and lipids with LDL cholesterol goal below 70 mg/dL. I also advised the patient to eat a healthy diet with plenty of whole grains, cereals, fruits and vegetables, exercise regularly and maintain ideal body weight .  He was advised to follow-up with his cardiologist Dr. Aundra Dubin for follow-up echocardiogram and if his ejection fraction is greater than 35% he may switch Eliquis back to Plavix.    Follow-up transthoracic echocardiogram study was ordered today.  He will return for follow-up in the future in 6 months with my nurse practitioner Janett Billow or call earlier if necessary.

## 2019-10-15 ENCOUNTER — Ambulatory Visit (HOSPITAL_COMMUNITY): Payer: Medicare Other | Attending: Cardiology

## 2019-10-15 ENCOUNTER — Other Ambulatory Visit: Payer: Self-pay

## 2019-10-15 DIAGNOSIS — I77810 Thoracic aortic ectasia: Secondary | ICD-10-CM | POA: Insufficient documentation

## 2019-10-15 DIAGNOSIS — Z950 Presence of cardiac pacemaker: Secondary | ICD-10-CM | POA: Diagnosis not present

## 2019-10-15 DIAGNOSIS — I252 Old myocardial infarction: Secondary | ICD-10-CM | POA: Insufficient documentation

## 2019-10-15 DIAGNOSIS — Z87891 Personal history of nicotine dependence: Secondary | ICD-10-CM | POA: Diagnosis not present

## 2019-10-15 DIAGNOSIS — I10 Essential (primary) hypertension: Secondary | ICD-10-CM | POA: Diagnosis not present

## 2019-10-15 DIAGNOSIS — I080 Rheumatic disorders of both mitral and aortic valves: Secondary | ICD-10-CM | POA: Insufficient documentation

## 2019-10-15 DIAGNOSIS — I6300 Cerebral infarction due to thrombosis of unspecified precerebral artery: Secondary | ICD-10-CM | POA: Insufficient documentation

## 2019-10-15 DIAGNOSIS — E785 Hyperlipidemia, unspecified: Secondary | ICD-10-CM | POA: Insufficient documentation

## 2019-10-31 ENCOUNTER — Encounter (HOSPITAL_COMMUNITY): Payer: Self-pay

## 2019-11-02 ENCOUNTER — Encounter (HOSPITAL_COMMUNITY): Payer: Medicare Other

## 2019-11-08 ENCOUNTER — Ambulatory Visit (INDEPENDENT_AMBULATORY_CARE_PROVIDER_SITE_OTHER): Payer: Medicare Other | Admitting: Urology

## 2019-11-08 ENCOUNTER — Encounter: Payer: Self-pay | Admitting: Urology

## 2019-11-08 ENCOUNTER — Other Ambulatory Visit: Payer: Self-pay

## 2019-11-08 VITALS — BP 121/62 | HR 70 | Ht 72.0 in | Wt 180.0 lb

## 2019-11-08 DIAGNOSIS — R972 Elevated prostate specific antigen [PSA]: Secondary | ICD-10-CM

## 2019-11-08 DIAGNOSIS — N200 Calculus of kidney: Secondary | ICD-10-CM | POA: Diagnosis not present

## 2019-11-08 DIAGNOSIS — N401 Enlarged prostate with lower urinary tract symptoms: Secondary | ICD-10-CM

## 2019-11-08 DIAGNOSIS — R350 Frequency of micturition: Secondary | ICD-10-CM | POA: Diagnosis not present

## 2019-11-08 NOTE — Progress Notes (Signed)
11/08/2019 10:50 AM   Kristopher Fernandez 28-Dec-1931 AN:[id number]  Referring provider: Tracie Harrier, MD [address] [facility] [place],  [place] [address]  Chief complaint: Elevated PSA  HPI: Kristopher Fernandez is an 85 y.o. male seen at request of Dr. Ginette Fernandez for evaluation of an elevated PSA.  He was seen for a wellness visit 10/11/2019 and a PSA was drawn which was >150.  He had no voiding complaints.  A urinalysis showed 4-10 WBCs and a urine culture was ordered which was negative.  He has baseline lower urinary tract symptoms of frequency and nocturia x3.  Denies dysuria or gross hematuria.  Past urologic history remarkable for stone disease.  He saw Dr. Erlene Fernandez in 2017 for a 4 mm ureteral calculus which he subsequently passed.   PMH: Past Medical History:  Diagnosis Date  . Anginal pain ([place])   . Aortic stenosis 07/03/2014   Overview:  Mild with calculated aortic valve area of 1.05cm2  . Basal cell carcinoma, ear 03/31/2015  . Bradycardia 07/03/2014  . CHF (congestive heart failure) ([place])   . DDD (degenerative disc disease), lumbar 08/06/2015  . H/O cardiac catheterization 07/03/2014   Overview:  Cypher stent proximal and distal RCA 07/18/05 and TAXUS stent mid LAD 07/20/05 at [facility]  . HTN (hypertension) 07/03/2014  . Hyperlipidemia 07/03/2014  . Lumbosacral radiculopathy at S1 08/22/2015  . MI (myocardial infarction) ([place]) 07/03/2014   Overview:  Mi 07/17/05  . Myocardial infarction ([place])   . Normocytic normochromic anemia 09/14/2015  . Pacemaker 07/03/2014   Overview:  Dual chamber pacemaker 08/11/11  . Shortness of breath dyspnea   . SOB (shortness of breath) on exertion 05/13/2016    Surgical History: Past Surgical History:  Procedure Laterality Date  . CARDIAC CATHETERIZATION Bilateral 07/14/2016   Procedure: Right/Left Heart Cath and Coronary Angiography;  Surgeon: Isaias Cowman, MD;  Location: [place] [facility];  Service: Cardiovascular;  Laterality:  Bilateral;  . CORONARY ANGIOPLASTY    . HERNIA REPAIR    . LEFT HEART CATH AND CORONARY ANGIOGRAPHY N/A 05/09/2019   Procedure: LEFT HEART CATH AND CORONARY ANGIOGRAPHY;  Surgeon: Corey Skains, MD;  Location: [place] [facility];  Service: Cardiovascular;  Laterality: N/A;  . RIGHT HEART CATH N/A 05/17/2019   Procedure: RIGHT HEART CATH;  Surgeon: Larey Dresser, MD;  Location: [place] [facility];  Service: Cardiovascular;  Laterality: N/A;    Home Medications:  Allergies as of 11/08/2019   No Known Allergies     Medication List       Accurate as of November 08, 2019 10:50 AM. If you have any questions, ask your nurse or doctor.        apixaban 5 MG Tabs tablet Commonly known as: ELIQUIS Take 1 tablet (5 mg total) by mouth 2 (two) times daily.   atorvastatin 80 MG tablet Commonly known as: LIPITOR Take 80 mg by mouth daily.   budesonide-formoterol 160-4.5 MCG/ACT inhaler Commonly known as: Symbicort Inhale 2 puffs into the lungs 2 (two) times daily.   carvedilol 3.125 MG tablet Commonly known as: COREG Take 1 tablet (3.125 mg total) by mouth 2 (two) times daily with a meal.   furosemide 20 MG tablet Commonly known as: Lasix Take 1 tablet (20 mg total) by mouth as needed (weight. Take one tablet if weight increases by 3 pounds overnight or 5 pounds in one week.).   meclizine 25 MG tablet Commonly known as: ANTIVERT Take 1 tablet (25 mg  total) by mouth 3 (three) times daily as needed for dizziness.   ranolazine 500 MG 12 hr tablet Commonly known as: RANEXA Take 500 mg by mouth 2 (two) times daily.   spironolactone 25 MG tablet Commonly known as: ALDACTONE Take 1 tablet (25 mg total) by mouth at bedtime.   venlafaxine XR 75 MG 24 hr capsule Commonly known as: EFFEXOR-XR Take 1 capsule (75 mg total) by mouth daily with breakfast.       Allergies: No Known Allergies  Family History: Family History  Problem Relation Age of Onset  . Bladder Cancer Neg  Hx   . Kidney cancer Neg Hx   . Prostate cancer Neg Hx     Social History:  reports that he quit smoking about 43 years ago. His smoking use included cigarettes. He quit after 10.00 years of use. He has quit using smokeless tobacco. He reports that he does not drink alcohol or use drugs.  ROS: UROLOGY Frequent Urination?: Yes Hard to postpone urination?: Yes Burning/pain with urination?: No Get up at night to urinate?: No Leakage of urine?: No Urine stream starts and stops?: Yes Trouble starting stream?: No Do you have to strain to urinate?: No Blood in urine?: No Urinary tract infection?: No Sexually transmitted disease?: No Injury to kidneys or bladder?: No Painful intercourse?: No Weak stream?: No Erection problems?: No Penile pain?: No  Gastrointestinal Nausea?: No Vomiting?: No Indigestion/heartburn?: No Diarrhea?: No Constipation?: Yes  Constitutional Fever: No Night sweats?: No Weight loss?: No Fatigue?: No  Skin Skin rash/lesions?: No Itching?: No  Eyes Blurred vision?: No Double vision?: No  Ears/Nose/Throat Sore throat?: No Sinus problems?: No  Hematologic/Lymphatic Swollen glands?: No Easy bruising?: No  Cardiovascular Leg swelling?: No Chest pain?: No  Respiratory Cough?: No Shortness of breath?: No  Endocrine Excessive thirst?: No  Musculoskeletal Back pain?: No Joint pain?: No  Neurological Headaches?: No Dizziness?: No  Psychologic Depression?: No Anxiety?: No  Physical Exam: BP 121/62   Pulse 70   Ht 6' (1.829 m)   Wt 180 lb (81.6 kg)   BMI 24.41 kg/m   Constitutional:  Alert and oriented, No acute distress. HEENT: [place] AT, moist mucus membranes.  Trachea midline, no masses. Cardiovascular: No clubbing, cyanosis, or edema. Respiratory: Normal respiratory effort, no increased work of breathing. GI: Abdomen is soft, nontender, nondistended, no abdominal masses GU: Prostate 50 g, flat, smooth without nodules or  induration Lymph: No cervical or inguinal lymphadenopathy. Skin: No rashes, bruises or suspicious lesions. Neurologic: Grossly intact, no focal deficits, moving all 4 extremities. Psychiatric: Normal mood and affect.   Assessment & Plan:    - Elevated PSA 85 y.o. male with significant PSA elevation.  DRE without nodules or induration.  Urinalysis showed mild pyuria but culture was negative.  Will repeat his PSA today.  If significant elevated we discussed possibility of prostate cancer and prostate biopsy.  The procedure was cussed in detail.  He would need clearance to stop his Eliquis if a biopsy is scheduled.   Abbie Sons, [place] [address] [place], [place] [address] [phone]

## 2019-11-09 LAB — PSA: Prostate Specific Ag, Serum: 310 ng/mL — ABNORMAL HIGH (ref 0.0–4.0)

## 2019-11-13 ENCOUNTER — Telehealth: Payer: Self-pay | Admitting: Urology

## 2019-11-13 NOTE — Telephone Encounter (Signed)
Called pt informed him of the information below. Pt gave verbal understanding and states that he would like to proceed with surgery. Daughter made aware as well via my chart. Clearance faxed.

## 2019-11-13 NOTE — Telephone Encounter (Signed)
Mr. Imgrund was referred for a PSA of >150.  At our visit last week I recommended that we repeated initially and if still elevated to schedule a prostate biopsy.  His repeat PSA was elevated at 310 and would definitely recommend scheduling a biopsy.  Due to his past medical history would recommend it be performed in same-day surgery under sedation.  He will also need clearance with Dr. Henrene Pastor shows to stop Eliquis prior to the procedure.  If he is in agreement let me know and I will give the scheduling sheet to [facility].

## 2019-11-17 ENCOUNTER — Encounter: Payer: Self-pay | Admitting: Urology

## 2019-11-23 ENCOUNTER — Telehealth: Payer: Self-pay

## 2019-11-23 ENCOUNTER — Telehealth (HOSPITAL_COMMUNITY): Payer: Self-pay | Admitting: Cardiology

## 2019-11-23 NOTE — Telephone Encounter (Signed)
Incoming fax from pt's cardiologist giving approval for pt to d/c Eliquis 2 days prior to procedure as well as the day of. Denny Peon needs a scheduling sheet. Thanks

## 2019-11-23 NOTE — Telephone Encounter (Signed)
Medical clearance completed by Dr Aundra Dubin Patient is approved to proceed with procedure ( prostate biopsy in OR)    from a cardiac standpoint. Medication prep- hold eliquis 2 days prior and day of procedure  Faxed to [facility] [phone]

## 2019-11-27 ENCOUNTER — Encounter (HOSPITAL_COMMUNITY): Payer: Self-pay

## 2019-11-28 ENCOUNTER — Other Ambulatory Visit: Payer: Self-pay | Admitting: Urology

## 2019-11-28 ENCOUNTER — Encounter: Payer: Self-pay | Admitting: Urology

## 2019-11-28 DIAGNOSIS — R972 Elevated prostate specific antigen [PSA]: Secondary | ICD-10-CM

## 2019-12-03 ENCOUNTER — Other Ambulatory Visit: Payer: Medicare Other

## 2019-12-03 ENCOUNTER — Other Ambulatory Visit: Payer: Self-pay

## 2019-12-03 ENCOUNTER — Other Ambulatory Visit: Payer: Self-pay | Admitting: Radiology

## 2019-12-03 DIAGNOSIS — R972 Elevated prostate specific antigen [PSA]: Secondary | ICD-10-CM

## 2019-12-04 LAB — URINALYSIS, COMPLETE
Bilirubin, UA: NEGATIVE
Glucose, UA: NEGATIVE
Ketones, UA: NEGATIVE
Nitrite, UA: NEGATIVE
Protein,UA: NEGATIVE
RBC, UA: NEGATIVE
Specific Gravity, UA: 1.025 (ref 1.005–1.030)
Urobilinogen, Ur: 0.2 mg/dL (ref 0.2–1.0)
pH, UA: 5.5 (ref 5.0–7.5)

## 2019-12-04 LAB — MICROSCOPIC EXAMINATION
Bacteria, UA: NONE SEEN
RBC, Urine: NONE SEEN /hpf (ref 0–2)
WBC, UA: >30 /hpf — AB (ref 0–5)

## 2019-12-05 ENCOUNTER — Encounter
Admission: RE | Admit: 2019-12-05 | Discharge: 2019-12-05 | Disposition: A | Discharge status: Home / Self Care | Payer: Medicare Other | Source: Ambulatory Visit | Attending: Urology | Admitting: Urology

## 2019-12-05 ENCOUNTER — Other Ambulatory Visit: Payer: Self-pay

## 2019-12-05 NOTE — Patient Instructions (Signed)
Your procedure is scheduled on: 12/11/2019 Tues Report to Same Day Surgery 2nd floor medical mall [facility] Entrance-take elevator on left to 2nd floor.  Check in with surgery information desk.) To find out your arrival time please call [phone] between 1PM - 3PM on 12/10/2019 Mon  Remember: Instructions that are not followed completely may result in serious medical risk, up to and including death, or upon the discretion of your surgeon and anesthesiologist your surgery may need to be rescheduled.    _x___ 1. Do not eat food after midnight the night before your procedure. You may drink clear liquids up to 2 hours before you are scheduled to arrive at the hospital for your procedure.  Do not drink clear liquids within 2 hours of your scheduled arrival to the hospital.  Clear liquids include  --Water or Apple juice without pulp  --Clear carbohydrate beverage such as ClearFast or Gatorade  --Black Coffee or Clear Tea (No milk, no creamers, do not add anything to                  the coffee or Tea Type 1 and type 2 diabetics should only drink water.   ____Ensure clear carbohydrate drink on the way to the hospital for bariatric patients  ____Ensure clear carbohydrate drink 3 hours before surgery.   No gum chewing or hard candies.     __x__ 2. No Alcohol for 24 hours before or after surgery.   __x__3. No Smoking or e-cigarettes for 24 prior to surgery.  Do not use any chewable tobacco products for at least 6 hour prior to surgery   ____  4. Bring all medications with you on the day of surgery if instructed.    __x__ 5. Notify your doctor if there is any change in your medical condition     (cold, fever, infections).    x___6. On the morning of surgery brush your teeth with toothpaste and water.  You may rinse your mouth with mouth wash if you wish.  Do not swallow any toothpaste or mouthwash.   Do not wear jewelry, make-up, hairpins, clips or nail polish.  Do not wear lotions,  powders, or perfumes. You may wear deodorant.  Do not shave 48 hours prior to surgery. Men may shave face and neck.  Do not bring valuables to the hospital.    [facility] is not responsible for any belongings or valuables.               Contacts, dentures or bridgework may not be worn into surgery.  Leave your suitcase in the car. After surgery it may be brought to your room.  For patients admitted to the hospital, discharge time is determined by your                       treatment team.  _  Patients discharged the day of surgery will not be allowed to drive home.  You will need someone to drive you home and stay with you the night of your procedure.    Please read over the following fact sheets that you were given:   [facility] Preparing for Surgery and or MRSA Information   _x___ Take anti-hypertensive listed below, cardiac, seizure, asthma,     anti-reflux and psychiatric medicines. These include:  1. budesonide-formoterol (SYMBICORT) 160-4.5 MCG/ACT inhaler  2.carvedilol (COREG) 3.125 MG tablet  3.  4.  5.  6.  ____Fleets enema or Magnesium Citrate as  directed.   ____ Use CHG Soap or sage wipes as directed on instruction sheet   ____ Use inhalers on the day of surgery and bring to hospital day of surgery  ____ Stop Metformin and Janumet 2 days prior to surgery.    ____ Take 1/2 of usual insulin dose the night before surgery and none on the morning     surgery.   _x___ Follow recommendations from Cardiologist, Pulmonologist or PCP regarding          stopping Aspirin, Coumadin, Plavix ,Eliquis, Effient, or Pradaxa, and Pletal.  X____Stop Anti-inflammatories such as Advil, Aleve, Ibuprofen, Motrin, Naproxen, Naprosyn, Goodies powders or aspirin products. OK to take Tylenol and                          Celebrex.   _x___ Stop supplements until after surgery.  But may continue Vitamin D, Vitamin B,       and multivitamin.   ____ Bring C-Pap to the hospital.

## 2019-12-06 ENCOUNTER — Other Ambulatory Visit
Admission: RE | Admit: 2019-12-06 | Discharge: 2019-12-06 | Disposition: A | Discharge status: Home / Self Care | Payer: Medicare Other | Source: Ambulatory Visit | Attending: Urology | Admitting: Urology

## 2019-12-06 ENCOUNTER — Other Ambulatory Visit: Payer: Self-pay

## 2019-12-06 DIAGNOSIS — Z20828 Contact with and (suspected) exposure to other viral communicable diseases: Secondary | ICD-10-CM | POA: Diagnosis not present

## 2019-12-06 DIAGNOSIS — Z01812 Encounter for preprocedural laboratory examination: Secondary | ICD-10-CM | POA: Insufficient documentation

## 2019-12-06 LAB — CULTURE, URINE COMPREHENSIVE

## 2019-12-06 LAB — SARS CORONAVIRUS 2 (TAT 6-24 HRS): SARS Coronavirus 2: NEGATIVE

## 2019-12-07 ENCOUNTER — Other Ambulatory Visit: Payer: Self-pay | Admitting: Urology

## 2019-12-07 ENCOUNTER — Other Ambulatory Visit: Admission: RE | Admit: 2019-12-07 | Payer: Medicare Other | Source: Ambulatory Visit

## 2019-12-07 MED ORDER — AMOXICILLIN 875 MG PO TABS: 875.0000 mg | ORAL_TABLET | Freq: Two times a day (BID) | ORAL | 0 refills | Status: DC

## 2019-12-10 MED ORDER — SODIUM CHLORIDE 0.9 % IV SOLN
1.0000 g | INTRAVENOUS | Status: AC
  Administered 2019-12-11: 1 g via INTRAVENOUS
  Filled 2019-12-10: qty 1

## 2019-12-11 ENCOUNTER — Encounter
Admission: RE | Disposition: A | Discharge status: Home / Self Care | Payer: Self-pay | Source: Home / Self Care | Attending: Urology

## 2019-12-11 ENCOUNTER — Emergency Department: Payer: Medicare Other

## 2019-12-11 ENCOUNTER — Inpatient Hospital Stay
Admission: RE | Admit: 2019-12-11 | Discharge: 2019-12-11 | Disposition: A | Discharge status: Home / Self Care | Payer: Medicare Other | Source: Ambulatory Visit | Attending: Urology | Admitting: Urology

## 2019-12-11 ENCOUNTER — Other Ambulatory Visit: Payer: Self-pay

## 2019-12-11 ENCOUNTER — Encounter: Payer: Self-pay | Admitting: Emergency Medicine

## 2019-12-11 ENCOUNTER — Telehealth: Payer: Self-pay | Admitting: Urology

## 2019-12-11 ENCOUNTER — Ambulatory Visit: Payer: Medicare Other | Admitting: Registered Nurse

## 2019-12-11 ENCOUNTER — Ambulatory Visit
Admission: RE | Admit: 2019-12-11 | Discharge: 2019-12-11 | Disposition: A | Discharge status: Home / Self Care | Payer: Medicare Other | Source: Home / Self Care | Attending: Urology | Admitting: Urology

## 2019-12-11 ENCOUNTER — Encounter: Payer: Self-pay | Admitting: Urology

## 2019-12-11 ENCOUNTER — Inpatient Hospital Stay
Admission: EM | Admit: 2019-12-11 | Discharge: 2019-12-15 | DRG: 723 | Disposition: A | Discharge status: Home Health | Payer: Medicare Other | Attending: Internal Medicine | Admitting: Internal Medicine

## 2019-12-11 DIAGNOSIS — Z7951 Long term (current) use of inhaled steroids: Secondary | ICD-10-CM

## 2019-12-11 DIAGNOSIS — N183 Chronic kidney disease, stage 3 unspecified: Secondary | ICD-10-CM | POA: Insufficient documentation

## 2019-12-11 DIAGNOSIS — Z7901 Long term (current) use of anticoagulants: Secondary | ICD-10-CM

## 2019-12-11 DIAGNOSIS — Z87891 Personal history of nicotine dependence: Secondary | ICD-10-CM

## 2019-12-11 DIAGNOSIS — R972 Elevated prostate specific antigen [PSA]: Secondary | ICD-10-CM

## 2019-12-11 DIAGNOSIS — R6883 Chills (without fever): Secondary | ICD-10-CM

## 2019-12-11 DIAGNOSIS — Z66 Do not resuscitate: Secondary | ICD-10-CM | POA: Diagnosis present

## 2019-12-11 DIAGNOSIS — Z8673 Personal history of transient ischemic attack (TIA), and cerebral infarction without residual deficits: Secondary | ICD-10-CM

## 2019-12-11 DIAGNOSIS — Z955 Presence of coronary angioplasty implant and graft: Secondary | ICD-10-CM | POA: Insufficient documentation

## 2019-12-11 DIAGNOSIS — Z79899 Other long term (current) drug therapy: Secondary | ICD-10-CM

## 2019-12-11 DIAGNOSIS — I252 Old myocardial infarction: Secondary | ICD-10-CM

## 2019-12-11 DIAGNOSIS — C61 Malignant neoplasm of prostate: Secondary | ICD-10-CM | POA: Insufficient documentation

## 2019-12-11 DIAGNOSIS — I350 Nonrheumatic aortic (valve) stenosis: Secondary | ICD-10-CM | POA: Diagnosis present

## 2019-12-11 DIAGNOSIS — N179 Acute kidney failure, unspecified: Secondary | ICD-10-CM | POA: Diagnosis present

## 2019-12-11 DIAGNOSIS — Z20828 Contact with and (suspected) exposure to other viral communicable diseases: Secondary | ICD-10-CM | POA: Diagnosis present

## 2019-12-11 DIAGNOSIS — I6930 Unspecified sequelae of cerebral infarction: Secondary | ICD-10-CM | POA: Insufficient documentation

## 2019-12-11 DIAGNOSIS — Z9889 Other specified postprocedural states: Secondary | ICD-10-CM

## 2019-12-11 DIAGNOSIS — Z950 Presence of cardiac pacemaker: Secondary | ICD-10-CM

## 2019-12-11 DIAGNOSIS — K6289 Other specified diseases of anus and rectum: Secondary | ICD-10-CM

## 2019-12-11 DIAGNOSIS — I5042 Chronic combined systolic (congestive) and diastolic (congestive) heart failure: Secondary | ICD-10-CM | POA: Diagnosis present

## 2019-12-11 DIAGNOSIS — N1831 Chronic kidney disease, stage 3a: Secondary | ICD-10-CM | POA: Diagnosis present

## 2019-12-11 DIAGNOSIS — E785 Hyperlipidemia, unspecified: Secondary | ICD-10-CM | POA: Insufficient documentation

## 2019-12-11 DIAGNOSIS — I5022 Chronic systolic (congestive) heart failure: Secondary | ICD-10-CM | POA: Insufficient documentation

## 2019-12-11 DIAGNOSIS — R2681 Unsteadiness on feet: Secondary | ICD-10-CM | POA: Diagnosis not present

## 2019-12-11 DIAGNOSIS — R531 Weakness: Secondary | ICD-10-CM

## 2019-12-11 DIAGNOSIS — F329 Major depressive disorder, single episode, unspecified: Secondary | ICD-10-CM | POA: Diagnosis present

## 2019-12-11 DIAGNOSIS — Z85828 Personal history of other malignant neoplasm of skin: Secondary | ICD-10-CM

## 2019-12-11 DIAGNOSIS — I2510 Atherosclerotic heart disease of native coronary artery without angina pectoris: Secondary | ICD-10-CM | POA: Diagnosis present

## 2019-12-11 DIAGNOSIS — Z9181 History of falling: Secondary | ICD-10-CM

## 2019-12-11 DIAGNOSIS — D696 Thrombocytopenia, unspecified: Secondary | ICD-10-CM | POA: Diagnosis present

## 2019-12-11 DIAGNOSIS — I509 Heart failure, unspecified: Secondary | ICD-10-CM

## 2019-12-11 DIAGNOSIS — I130 Hypertensive heart and chronic kidney disease with heart failure and stage 1 through stage 4 chronic kidney disease, or unspecified chronic kidney disease: Secondary | ICD-10-CM | POA: Diagnosis present

## 2019-12-11 DIAGNOSIS — M5136 Other intervertebral disc degeneration, lumbar region: Secondary | ICD-10-CM | POA: Diagnosis present

## 2019-12-11 DIAGNOSIS — R739 Hyperglycemia, unspecified: Secondary | ICD-10-CM | POA: Diagnosis not present

## 2019-12-11 HISTORY — PX: PROSTATE BIOPSY: SHX241

## 2019-12-11 HISTORY — PX: TRANSRECTAL ULTRASOUND: SHX5146

## 2019-12-11 LAB — COMPREHENSIVE METABOLIC PANEL
ALT: 14 U/L (ref 0–44)
AST: 24 U/L (ref 15–41)
Albumin: 3.2 g/dL — ABNORMAL LOW (ref 3.5–5.0)
Alkaline Phosphatase: 88 U/L (ref 38–126)
Anion gap: 11 (ref 5–15)
BUN: 28 mg/dL — ABNORMAL HIGH (ref 8–23)
CO2: 20 mmol/L — ABNORMAL LOW (ref 22–32)
Calcium: 8.7 mg/dL — ABNORMAL LOW (ref 8.9–10.3)
Chloride: 103 mmol/L (ref 98–111)
Creatinine, Ser: 1.36 mg/dL — ABNORMAL HIGH (ref 0.61–1.24)
GFR calc Af Amer: 55 mL/min — ABNORMAL LOW (ref >60)
GFR calc non Af Amer: 47 mL/min — ABNORMAL LOW (ref >60)
Glucose, Bld: 159 mg/dL — ABNORMAL HIGH (ref 70–99)
Potassium: 4 mmol/L (ref 3.5–5.1)
Sodium: 134 mmol/L — ABNORMAL LOW (ref 135–145)
Total Bilirubin: 0.9 mg/dL (ref 0.3–1.2)
Total Protein: 6.4 g/dL — ABNORMAL LOW (ref 6.5–8.1)

## 2019-12-11 LAB — CBC WITH DIFFERENTIAL/PLATELET
Abs Immature Granulocytes: 0.04 10*3/uL (ref 0.00–0.07)
Basophils Absolute: 0 10*3/uL (ref 0.0–0.1)
Basophils Relative: 0 %
Eosinophils Absolute: 0 10*3/uL (ref 0.0–0.5)
Eosinophils Relative: 0 %
HCT: 36.6 % — ABNORMAL LOW (ref 39.0–52.0)
Hemoglobin: 12.5 g/dL — ABNORMAL LOW (ref 13.0–17.0)
Immature Granulocytes: 1 %
Lymphocytes Relative: 2 %
Lymphs Abs: 0.1 10*3/uL — ABNORMAL LOW (ref 0.7–4.0)
MCH: 30.8 pg (ref 26.0–34.0)
MCHC: 34.2 g/dL (ref 30.0–36.0)
MCV: 90.1 fL (ref 80.0–100.0)
Monocytes Absolute: 0.4 10*3/uL (ref 0.1–1.0)
Monocytes Relative: 5 %
Neutro Abs: 6.7 10*3/uL (ref 1.7–7.7)
Neutrophils Relative %: 92 %
Platelets: 93 10*3/uL — ABNORMAL LOW (ref 150–400)
RBC: 4.06 MIL/uL — ABNORMAL LOW (ref 4.22–5.81)
RDW: 13 % (ref 11.5–15.5)
WBC: 7.2 10*3/uL (ref 4.0–10.5)
nRBC: 0 % (ref 0.0–0.2)

## 2019-12-11 LAB — URINALYSIS, COMPLETE (UACMP) WITH MICROSCOPIC
Bacteria, UA: NONE SEEN
Bilirubin Urine: NEGATIVE
Glucose, UA: NEGATIVE mg/dL
Ketones, ur: NEGATIVE mg/dL
Nitrite: NEGATIVE
Protein, ur: NEGATIVE mg/dL
RBC / HPF: >50 RBC/hpf — ABNORMAL HIGH (ref 0–5)
Specific Gravity, Urine: 1.014 (ref 1.005–1.030)
pH: 5 (ref 5.0–8.0)

## 2019-12-11 LAB — LACTIC ACID, PLASMA
Lactic Acid, Venous: 1.7 mmol/L (ref 0.5–1.9)
Lactic Acid, Venous: 1.5 mmol/L (ref 0.5–1.9)

## 2019-12-11 LAB — SARS CORONAVIRUS 2 (TAT 6-24 HRS): SARS Coronavirus 2: NEGATIVE

## 2019-12-11 SURGERY — BIOPSY, PROSTATE
Anesthesia: Monitor Anesthesia Care | Site: Prostate

## 2019-12-11 MED ORDER — PHENYLEPHRINE HCL (PRESSORS) 10 MG/ML IV SOLN
INTRAVENOUS | Status: AC
  Filled 2019-12-11: qty 1

## 2019-12-11 MED ORDER — CARVEDILOL 3.125 MG PO TABS
3.1250 mg | ORAL_TABLET | Freq: Two times a day (BID) | ORAL | Status: DC
  Administered 2019-12-12 – 2019-12-15 (×7): 3.125 mg via ORAL
  Filled 2019-12-11 (×9): qty 1

## 2019-12-11 MED ORDER — IPRATROPIUM-ALBUTEROL 0.5-2.5 (3) MG/3ML IN SOLN: 3.0000 mL | Freq: Four times a day (QID) | RESPIRATORY_TRACT | Status: DC | PRN

## 2019-12-11 MED ORDER — FENTANYL CITRATE (PF) 100 MCG/2ML IJ SOLN
INTRAMUSCULAR | Status: DC | PRN
  Administered 2019-12-11: 25 ug via INTRAVENOUS

## 2019-12-11 MED ORDER — FAMOTIDINE 20 MG PO TABS: 20.0000 mg | ORAL_TABLET | Freq: Once | ORAL | Status: AC

## 2019-12-11 MED ORDER — EPHEDRINE SULFATE 50 MG/ML IJ SOLN
INTRAMUSCULAR | Status: DC | PRN
  Administered 2019-12-11: 5 mg via INTRAVENOUS

## 2019-12-11 MED ORDER — SENNOSIDES-DOCUSATE SODIUM 8.6-50 MG PO TABS
1.0000 | ORAL_TABLET | Freq: Every evening | ORAL | Status: DC | PRN
  Filled 2019-12-11: qty 1

## 2019-12-11 MED ORDER — PROPOFOL 10 MG/ML IV BOLUS
INTRAVENOUS | Status: DC | PRN
  Administered 2019-12-11: 20 ug via INTRAVENOUS
  Administered 2019-12-11: 10 ug via INTRAVENOUS

## 2019-12-11 MED ORDER — FENTANYL CITRATE (PF) 100 MCG/2ML IJ SOLN
INTRAMUSCULAR | Status: AC
  Filled 2019-12-11: qty 2

## 2019-12-11 MED ORDER — SUCCINYLCHOLINE CHLORIDE 20 MG/ML IJ SOLN
INTRAMUSCULAR | Status: AC
  Filled 2019-12-11: qty 1

## 2019-12-11 MED ORDER — EPHEDRINE SULFATE 50 MG/ML IJ SOLN
INTRAMUSCULAR | Status: AC
  Filled 2019-12-11: qty 1

## 2019-12-11 MED ORDER — PHENYLEPHRINE HCL (PRESSORS) 10 MG/ML IV SOLN
INTRAVENOUS | Status: DC | PRN
  Administered 2019-12-11: 100 ug via INTRAVENOUS
  Administered 2019-12-11 (×2): 150 ug via INTRAVENOUS

## 2019-12-11 MED ORDER — PIPERACILLIN-TAZOBACTAM 3.375 G IVPB 30 MIN
3.3750 g | Freq: Once | INTRAVENOUS | Status: AC
  Administered 2019-12-11: 3.375 g via INTRAVENOUS
  Filled 2019-12-11: qty 50

## 2019-12-11 MED ORDER — ATORVASTATIN CALCIUM 80 MG PO TABS
80.0000 mg | ORAL_TABLET | Freq: Every day | ORAL | Status: DC
  Administered 2019-12-11 – 2019-12-14 (×4): 80 mg via ORAL
  Filled 2019-12-11: qty 1
  Filled 2019-12-11 (×2): qty 4
  Filled 2019-12-11: qty 1
  Filled 2019-12-11: qty 4
  Filled 2019-12-11 (×3): qty 1

## 2019-12-11 MED ORDER — ACETAMINOPHEN 325 MG PO TABS
650.0000 mg | ORAL_TABLET | Freq: Four times a day (QID) | ORAL | Status: DC | PRN
  Administered 2019-12-12: 650 mg via ORAL
  Filled 2019-12-11: qty 2

## 2019-12-11 MED ORDER — ONDANSETRON HCL 4 MG/2ML IJ SOLN: 4.0000 mg | Freq: Once | INTRAMUSCULAR | Status: DC | PRN

## 2019-12-11 MED ORDER — DEXAMETHASONE SODIUM PHOSPHATE 10 MG/ML IJ SOLN
INTRAMUSCULAR | Status: AC
  Filled 2019-12-11: qty 1

## 2019-12-11 MED ORDER — LIDOCAINE HCL (PF) 2 % IJ SOLN
INTRAMUSCULAR | Status: AC
  Filled 2019-12-11: qty 5

## 2019-12-11 MED ORDER — APIXABAN 5 MG PO TABS
5.0000 mg | ORAL_TABLET | Freq: Two times a day (BID) | ORAL | Status: DC
  Administered 2019-12-12 – 2019-12-15 (×7): 5 mg via ORAL
  Filled 2019-12-11 (×8): qty 1

## 2019-12-11 MED ORDER — ACETAMINOPHEN 650 MG RE SUPP: 650.0000 mg | Freq: Four times a day (QID) | RECTAL | Status: DC | PRN

## 2019-12-11 MED ORDER — SODIUM CHLORIDE 0.9 % IV SOLN: INTRAVENOUS | Status: DC

## 2019-12-11 MED ORDER — LACTATED RINGERS IV BOLUS
1000.0000 mL | Freq: Once | INTRAVENOUS | Status: AC
  Administered 2019-12-11: 1000 mL via INTRAVENOUS

## 2019-12-11 MED ORDER — MORPHINE SULFATE (PF) 2 MG/ML IV SOLN: 2.0000 mg | INTRAVENOUS | Status: DC | PRN

## 2019-12-11 MED ORDER — TRAMADOL HCL 50 MG PO TABS: 50.0000 mg | ORAL_TABLET | Freq: Four times a day (QID) | ORAL | Status: DC | PRN

## 2019-12-11 MED ORDER — PROPOFOL 500 MG/50ML IV EMUL
INTRAVENOUS | Status: AC
  Filled 2019-12-11: qty 50

## 2019-12-11 MED ORDER — FENTANYL CITRATE (PF) 100 MCG/2ML IJ SOLN: 25.0000 ug | INTRAMUSCULAR | Status: DC | PRN

## 2019-12-11 MED ORDER — PROPOFOL 500 MG/50ML IV EMUL
INTRAVENOUS | Status: DC | PRN
  Administered 2019-12-11: 75 ug/kg/min via INTRAVENOUS

## 2019-12-11 MED ORDER — ALBUTEROL SULFATE HFA 108 (90 BASE) MCG/ACT IN AERS
2.0000 | INHALATION_SPRAY | Freq: Once | RESPIRATORY_TRACT | Status: AC
  Administered 2019-12-11: 2 via RESPIRATORY_TRACT
  Filled 2019-12-11: qty 6.7

## 2019-12-11 MED ORDER — ONDANSETRON HCL 4 MG/2ML IJ SOLN
INTRAMUSCULAR | Status: AC
  Filled 2019-12-11: qty 2

## 2019-12-11 MED ORDER — ENOXAPARIN SODIUM 40 MG/0.4ML ~~LOC~~ SOLN: 40.0000 mg | SUBCUTANEOUS | Status: DC

## 2019-12-11 MED ORDER — DEXMEDETOMIDINE HCL IN NACL 80 MCG/20ML IV SOLN
INTRAVENOUS | Status: AC
  Filled 2019-12-11: qty 20

## 2019-12-11 MED ORDER — LIDOCAINE HCL (CARDIAC) PF 100 MG/5ML IV SOSY
PREFILLED_SYRINGE | INTRAVENOUS | Status: DC | PRN
  Administered 2019-12-11: 60 mg via INTRAVENOUS

## 2019-12-11 MED ORDER — MORPHINE BOLUS VIA INFUSION: 2.0000 mg | INTRAVENOUS | Status: DC | PRN

## 2019-12-11 MED ORDER — RANOLAZINE ER 500 MG PO TB12
500.0000 mg | ORAL_TABLET | Freq: Two times a day (BID) | ORAL | Status: DC
  Administered 2019-12-12 – 2019-12-15 (×8): 500 mg via ORAL
  Filled 2019-12-11 (×11): qty 1

## 2019-12-11 MED ORDER — FAMOTIDINE 20 MG PO TABS
ORAL_TABLET | ORAL | Status: AC
  Administered 2019-12-11: 20 mg via ORAL
  Filled 2019-12-11: qty 1

## 2019-12-11 MED ORDER — VENLAFAXINE HCL ER 75 MG PO CP24
75.0000 mg | ORAL_CAPSULE | Freq: Every day | ORAL | Status: DC
  Administered 2019-12-12 – 2019-12-15 (×4): 75 mg via ORAL
  Filled 2019-12-11 (×5): qty 1

## 2019-12-11 MED ORDER — SODIUM CHLORIDE 0.9% FLUSH
3.0000 mL | Freq: Two times a day (BID) | INTRAVENOUS | Status: DC
  Administered 2019-12-12 – 2019-12-15 (×7): 3 mL via INTRAVENOUS

## 2019-12-11 SURGICAL SUPPLY — 12 items
COVER MAYO STAND REUSABLE (DRAPES) ×3 IMPLANT
DRSG TELFA 3X8 NADH (GAUZE/BANDAGES/DRESSINGS) ×3 IMPLANT
GAUZE SPONGE 4X4 12PLY STRL (GAUZE/BANDAGES/DRESSINGS) ×3 IMPLANT
GLOVE BIO SURGEON STRL SZ8 (GLOVE) ×3 IMPLANT
GUIDE NDL ENDOCAV 16-18 CVR (NEEDLE) IMPLANT
GUIDE NEEDLE ENDOCAV 16-18 CVR (NEEDLE) IMPLANT
INST BIOPSY MAXCORE 18GX25 (NEEDLE) ×3 IMPLANT
KIT TURNOVER CYSTO (KITS) ×3 IMPLANT
NDL GUIDE BIOPSY 644068 (NEEDLE) ×1 IMPLANT
NEEDLE GUIDE BIOPSY 644068 (NEEDLE) ×3 IMPLANT
PAD DRESSING TELFA 3X8 NADH (GAUZE/BANDAGES/DRESSINGS) ×1 IMPLANT
SURGILUBE 2OZ TUBE FLIPTOP (MISCELLANEOUS) ×3 IMPLANT

## 2019-12-11 NOTE — Interval H&P Note (Signed)
History and Physical Interval Note:  12/11/2019 7:36 AM  Kristopher Fernandez  has presented today for surgery, with the diagnosis of Elevated PSA.  The various methods of treatment have been discussed with the patient and family. After consideration of risks, benefits and other options for treatment, the patient has consented to  Procedure(s): PROSTATE BIOPSY (N/A) TRANSRECTAL ULTRASOUND (N/A) as a surgical intervention.  The patient's history has been reviewed, patient examined, no change in status, stable for surgery.  I have reviewed the patient's chart and labs.  Questions were answered to the patient's satisfaction.     [place]

## 2019-12-11 NOTE — Anesthesia Preprocedure Evaluation (Signed)
Anesthesia Evaluation  Patient identified by MRN, date of birth, ID band Patient awake    Reviewed: Allergy & Precautions, H&P , NPO status , Patient's Chart, lab work & pertinent test results, reviewed documented beta blocker date and time   Airway Mallampati: II   Neck ROM: full    Dental  (+) Poor Dentition   Pulmonary neg pulmonary ROS, shortness of breath, former smoker,    Pulmonary exam normal        Cardiovascular hypertension, + angina with exertion + Past MI and +CHF  negative cardio ROS Normal cardiovascular exam+ pacemaker  Rhythm:regular Rate:Normal     Neuro/Psych  Neuromuscular disease CVA, Residual Symptoms negative neurological ROS  negative psych ROS   GI/Hepatic negative GI ROS, Neg liver ROS,   Endo/Other  negative endocrine ROS  Renal/GU Renal diseasenegative Renal ROS  negative genitourinary   Musculoskeletal   Abdominal   Peds  Hematology negative hematology ROS (+) Blood dyscrasia, anemia ,   Anesthesia Other Findings Past Medical History: No date: Anginal pain ([place]) 07/03/2014: Aortic stenosis     Comment:  Overview:  Mild with calculated aortic valve area of               1.05cm2 03/31/2015: Basal cell carcinoma, ear 07/03/2014: Bradycardia No date: CHF (congestive heart failure) ([place]) 08/06/2015: DDD (degenerative disc disease), lumbar 07/03/2014: H/O cardiac catheterization     Comment:  Overview:  Cypher stent proximal and distal RCA 07/18/05              and TAXUS stent mid LAD 07/20/05 at [facility] 07/03/2014: HTN (hypertension) 07/03/2014: Hyperlipidemia 08/22/2015: Lumbosacral radiculopathy at S1 07/03/2014: MI (myocardial infarction) ([place])     Comment:  Overview:  Mi 07/17/05 No date: Myocardial infarction ([place]) 09/14/2015: Normocytic normochromic anemia 07/03/2014: Pacemaker     Comment:  Overview:  Dual chamber pacemaker 08/11/11 No date: Shortness of breath dyspnea 05/13/2016: SOB  (shortness of breath) on exertion Past Surgical History: 07/14/2016: CARDIAC CATHETERIZATION; Bilateral     Comment:  Procedure: Right/Left Heart Cath and Coronary               Angiography;  Surgeon: Isaias Cowman, MD;                Location: [place] [facility];  Service: Cardiovascular;              Laterality: Bilateral; No date: CORONARY ANGIOPLASTY No date: HERNIA REPAIR 05/09/2019: LEFT HEART CATH AND CORONARY ANGIOGRAPHY; N/A     Comment:  Procedure: LEFT HEART CATH AND CORONARY ANGIOGRAPHY;                Surgeon: Corey Skains, MD;  Location: [place]               CV LAB;  Service: Cardiovascular;  Laterality: N/A; 05/17/2019: RIGHT HEART CATH; N/A     Comment:  Procedure: RIGHT HEART CATH;  Surgeon: Larey Dresser,              MD;  Location: [place] [facility];  Service:               Cardiovascular;  Laterality: N/A;   Reproductive/Obstetrics negative OB ROS                             Anesthesia Physical Anesthesia Plan  ASA: III  Anesthesia Plan: General and MAC   Post-op Pain Management:  Induction:   PONV Risk Score and Plan:   Airway Management Planned:   Additional Equipment:   Intra-op Plan:   Post-operative Plan:   Informed Consent: I have reviewed the patients History and Physical, chart, labs and discussed the procedure including the risks, benefits and alternatives for the proposed anesthesia with the patient or authorized representative who has indicated his/her understanding and acceptance.     Dental Advisory Given  Plan Discussed with: CRNA  Anesthesia Plan Comments:         Anesthesia Quick Evaluation

## 2019-12-11 NOTE — Op Note (Signed)
Preoperative diagnosis:  1. Elevated PSA  Postoperative diagnosis:  1. Elevated PSA  Procedure: 1. Transrectal ultrasound prostate 2. Transrectal prostate biopsies  Surgeon: Abbie Sons, MD  Anesthesia: MAC  Complications: None  Intraoperative findings: Prostate volume 47 cc.  Peripheral zone diffusely hypoechoic bilaterally  EBL: Minimal  Specimens: Needle biopsy prostate x12  Indication: Kristopher Fernandez is a 85 y.o. patient with a PSA of >150 with a repeat PSA of 310.  After reviewing the management options for treatment, he elected to proceed with the above surgical procedure(s). We have discussed the potential benefits and risks of the procedure, side effects of the proposed treatment, the likelihood of the patient achieving the goals of the procedure, and any potential problems that might occur during the procedure or recuperation. Informed consent has been obtained.  Description of procedure:  The patient was taken to the operating room and general anesthesia was induced.  The patient was placed in the left lateral decubitus position and IV sedation was obtained by anesthesia.  Preoperative antibiotics were administered. A preoperative time-out was performed.   DRE was performed and no pathologic nodule or induration was appreciated.  Prostate volume was estimated at approximately 50 cc.  A transrectal ultrasound probe was lubricated and passed per rectum with findings as described above.  Standard 12 core biopsies were then performed under ultrasound guidance.  At the completion of procedure no significant rectal or urethral bleeding was noted.  He was placed in the supine position and transported to the PACU in stable condition.    Abbie Sons, M.D.

## 2019-12-11 NOTE — Telephone Encounter (Signed)
Agree with ED evaluation. 

## 2019-12-11 NOTE — H&P (Signed)
History and Physical    Kristopher Fernandez [id number] DOB: 01-29-1932 DOA: 12/11/2019  PCP: Tracie Harrier, MD  Patient coming from: home   Chief Complaint: chills, subjective fever  HPI: 85 y/o M w/ PMH of HLD, HTN, depression, pacemaker, CAD s/p stents, chronic combined CHF who presents w/ chills, subjective fever after a prostate biopsy. Prostate biopsy was done for elevated PSA as per pt. Pt stated he took his temperature at home and the highest was 39F. Furthermore, pt c/o rectum pain. The pain is dull, constant w/o radiation. Laying on his side makes the pain better and nothing makes the pain worse. The severity is currently 8/10. Pt denies ever having above stated symptoms in the past. Pt denies any lightheadedness, dizziness, chest pain, nausea, vomiting, abd pain, dysuria, diarrhea, or constipation. Of note, pt c/o urinary frequency or urgency which is chronic as per pt.   Review of Systems: As per HPI otherwise 10 point review of systems negative.    Past Medical History:  Diagnosis Date  . Anginal pain ([place])   . Aortic stenosis 07/03/2014   Overview:  Mild with calculated aortic valve area of 1.05cm2  . Basal cell carcinoma, ear 03/31/2015  . Bradycardia 07/03/2014  . CHF (congestive heart failure) ([place])   . DDD (degenerative disc disease), lumbar 08/06/2015  . H/O cardiac catheterization 07/03/2014   Overview:  Cypher stent proximal and distal RCA 07/18/05 and TAXUS stent mid LAD 07/20/05 at [facility]  . HTN (hypertension) 07/03/2014  . Hyperlipidemia 07/03/2014  . Lumbosacral radiculopathy at S1 08/22/2015  . MI (myocardial infarction) ([place]) 07/03/2014   Overview:  Mi 07/17/05  . Myocardial infarction ([place])   . Normocytic normochromic anemia 09/14/2015  . Pacemaker 07/03/2014   Overview:  Dual chamber pacemaker 08/11/11  . Shortness of breath dyspnea   . SOB (shortness of breath) on exertion 05/13/2016    Past Surgical History:  Procedure Laterality Date  . CARDIAC  CATHETERIZATION Bilateral 07/14/2016   Procedure: Right/Left Heart Cath and Coronary Angiography;  Surgeon: Isaias Cowman, MD;  Location: [place] [facility];  Service: Cardiovascular;  Laterality: Bilateral;  . CORONARY ANGIOPLASTY    . HERNIA REPAIR    . LEFT HEART CATH AND CORONARY ANGIOGRAPHY N/A 05/09/2019   Procedure: LEFT HEART CATH AND CORONARY ANGIOGRAPHY;  Surgeon: Corey Skains, MD;  Location: [place] [facility];  Service: Cardiovascular;  Laterality: N/A;  . RIGHT HEART CATH N/A 05/17/2019   Procedure: RIGHT HEART CATH;  Surgeon: Larey Dresser, MD;  Location: [place] [facility];  Service: Cardiovascular;  Laterality: N/A;     reports that he quit smoking about 44 years ago. His smoking use included cigarettes. He quit after 10.00 years of use. He has quit using smokeless tobacco. He reports that he does not drink alcohol or use drugs.  No Known Allergies  Family History  Problem Relation Age of Onset  . Bladder Cancer Neg Hx   . Kidney cancer Neg Hx   . Prostate cancer Neg Hx      Prior to Admission medications   Medication Sig Start Date End Date Taking? Authorizing Provider  acetaminophen (TYLENOL) 500 MG tablet Take 1,000 mg by mouth every 6 (six) hours as needed for mild pain or moderate pain.     [provider]  amoxicillin (AMOXIL) 875 MG tablet Take 1 tablet (875 mg total) by mouth every 12 (twelve) hours. 12/07/19   Stoioff, Ronda Fairly, MD  apixaban (ELIQUIS) 5 MG TABS tablet  Take 1 tablet (5 mg total) by mouth 2 (two) times daily. 05/23/19   Larey Dresser, MD  atorvastatin (LIPITOR) 80 MG tablet Take 80 mg by mouth daily at 6 PM.     [provider]  budesonide-formoterol (SYMBICORT) 160-4.5 MCG/ACT inhaler Inhale 2 puffs into the lungs 2 (two) times daily. Patient taking differently: Inhale 2 puffs into the lungs daily as needed (shortness of breath).  05/17/19 05/16/20  Epifanio Lesches, MD  carvedilol (COREG) 3.125 MG tablet Take 1  tablet (3.125 mg total) by mouth 2 (two) times daily with a meal. Patient taking differently: Take 3.125 mg by mouth 2 (two) times daily.  05/23/19   Larey Dresser, MD  furosemide (LASIX) 20 MG tablet Take 1 tablet (20 mg total) by mouth as needed (weight. Take one tablet if weight increases by 3 pounds overnight or 5 pounds in one week.). Patient not taking: Reported on 12/11/2019 05/23/19 05/22/20  Larey Dresser, MD  meclizine (ANTIVERT) 25 MG tablet Take 1 tablet (25 mg total) by mouth 3 (three) times daily as needed for dizziness. Patient not taking: Reported on 12/05/2019 05/23/19   Larey Dresser, MD  ranolazine (RANEXA) 500 MG 12 hr tablet Take 500 mg by mouth 2 (two) times daily.    [provider]  spironolactone (ALDACTONE) 25 MG tablet Take 1 tablet (25 mg total) by mouth at bedtime. Patient not taking: Reported on 12/05/2019 09/18/19   Larey Dresser, MD  venlafaxine XR (EFFEXOR-XR) 75 MG 24 hr capsule Take 1 capsule (75 mg total) by mouth daily with breakfast. Patient not taking: Reported on 12/05/2019 05/18/19   Epifanio Lesches, MD    Physical Exam: Vitals:   12/11/19 1339 12/11/19 1342 12/11/19 1400 12/11/19 1430  BP: 107/60  (!) 111/54 (!) 110/54  Pulse: 86  80 82  Resp: (!) 22  18 (!) 23  Temp: 99.5 F (37.5 C)     TempSrc: Oral     SpO2: 95%  93% 94%  Weight:  80.7 kg    Height:  6' (1.829 m)      Constitutional: NAD, calm, comfortable Vitals:   12/11/19 1339 12/11/19 1342 12/11/19 1400 12/11/19 1430  BP: 107/60  (!) 111/54 (!) 110/54  Pulse: 86  80 82  Resp: (!) 22  18 (!) 23  Temp: 99.5 F (37.5 C)     TempSrc: Oral     SpO2: 95%  93% 94%  Weight:  80.7 kg    Height:  6' (1.829 m)     Eyes: PERRL, lids and conjunctivae normal ENMT: Mucous membranes are moist. Posterior pharynx clear of any exudate or lesions. Neck: normal, supple Respiratory: clear to auscultation bilaterally, no wheezing, no crackles. Normal respiratory effort. No  accessory muscle use.  Cardiovascular: S1,S2+, no rubs / gallops. No extremity edema. 2+ pedal pulses.  Abdomen: Soft. ND. no tenderness. No guarding or rigidity. Bowel sounds positive.  Musculoskeletal: no clubbing / cyanosis. No contractures. Normal muscle tone.  Skin: no rashes, lesions, ulcers. No induration Neurologic: CN 2-12 grossly intact. Sensation intact, DTR normal.  Psychiatric: Normal judgment and insight. Alert and oriented x 3. Normal mood.    Labs on Admission: I have personally reviewed following labs and imaging studies  CBC: Recent Labs  Lab 12/11/19 1338  WBC 7.2  NEUTROABS 6.7  HGB 12.5*  HCT 36.6*  MCV 90.1  PLT 93*   Basic Metabolic Panel: Recent Labs  Lab 12/11/19 1338  NA 134*  K 4.0  CL 103  CO2 20*  GLUCOSE 159*  BUN 28*  CREATININE 1.36*  CALCIUM 8.7*   GFR: Estimated Creatinine Clearance: 43.6 mL/min (A) (by C-G formula based on SCr of 1.36 mg/dL (H)). Liver Function Tests: Recent Labs  Lab 12/11/19 1338  AST 24  ALT 14  ALKPHOS 88  BILITOT 0.9  PROT 6.4*  ALBUMIN 3.2*   No results for input(s): LIPASE, AMYLASE in the last 168 hours. No results for input(s): AMMONIA in the last 168 hours. Coagulation Profile: No results for input(s): INR, PROTIME in the last 168 hours. Cardiac Enzymes: No results for input(s): CKTOTAL, CKMB, CKMBINDEX, TROPONINI in the last 168 hours. BNP (last 3 results) No results for input(s): PROBNP in the last 8760 hours. HbA1C: No results for input(s): HGBA1C in the last 72 hours. CBG: No results for input(s): GLUCAP in the last 168 hours. Lipid Profile: No results for input(s): CHOL, HDL, LDLCALC, TRIG, CHOLHDL, LDLDIRECT in the last 72 hours. Thyroid Function Tests: No results for input(s): TSH, T4TOTAL, FREET4, T3FREE, THYROIDAB in the last 72 hours. Anemia Panel: No results for input(s): VITAMINB12, FOLATE, FERRITIN, TIBC, IRON, RETICCTPCT in the last 72 hours. Urine analysis:    Component  Value Date/Time   COLORURINE YELLOW (A) 10/08/2016 1209   APPEARANCEUR Hazy (A) 12/03/2019 1259   LABSPEC 1.036 (H) 10/08/2016 1209   PHURINE 6.0 10/08/2016 1209   GLUCOSEU Negative 12/03/2019 1259   HGBUR 2+ (A) 10/08/2016 1209   BILIRUBINUR Negative 12/03/2019 1259   KETONESUR NEGATIVE 10/08/2016 1209   PROTEINUR Negative 12/03/2019 1259   PROTEINUR NEGATIVE 10/08/2016 1209   NITRITE Negative 12/03/2019 1259   NITRITE NEGATIVE 10/08/2016 1209   LEUKOCYTESUR 2+ (A) 12/03/2019 1259    Radiological Exams on Admission: [place] Transrectal Complete  Result Date: 12/11/2019 Please see Notes tab for imaging impression.  DG Chest Portable 1 View  Result Date: 12/11/2019 CLINICAL DATA:  Chills EXAM: PORTABLE CHEST 1 VIEW COMPARISON:  05/18/2019 FINDINGS: Right basilar scarring. Blunting of the left costophrenic angle likely reflects chronic pleural thickening. No new consolidation or edema. Stable cardiomediastinal contours. Left chest wall dual lead pacemaker is present. IMPRESSION: No acute process in the chest. Electronically Signed   By: Macy Mis M.D.   On: 12/11/2019 14:16   [place] PROSTATE BIOPSY MULTIPLE  Result Date: 12/11/2019 Please see Notes tab for imaging impression.   EKG: Independently reviewed.   Assessment/Plan Active Problems:   * No active hospital problems. *  Chills: r/o bacteremia as pt had outpatient prostate biopsy today. Likely tranisent bacteremia w/ recent prostate biopsy. Blood cxs & urine cx ordered. Will hold off on more abxs as pt is afebrile, normal WBC and will monitor closely. Pt did receive zosyn x 1 in ER.   Rectal pain: secondary to prostate biopsy today. Tramadol, tylenol & morphine prn for pain.   Thrombocytopenia: etiology unclear. Will continue to monitor   AKI: baseline Cr is unknown. Avoid nephrotoxic meds. Will hold home dose of spironolactone.  Will continue to monitor   HTN: will continue on home dose of coreg, hold for MAP <65 and/or  HR <65. Hold home dose of spironolactone secondary to AKI.   Chronic combined CHF: only takes lasix prn for weight gain. Stable  Hx of CAD: s/p stents. Hx of pacemaker. Will continue on home dose of coreg, ranexa, & statin. Takes eliquis at home but unsure why. Pt denies any hx of a.fib/flutter or CVA   HLD: will continue on statin  Hyperglycemia: No hx of DM. Will continue to monitor  Depression: severity unknown. Continue on home dose of venlafaxine    DVT prophylaxis: eliquis Code Status:  DNR Family Communication: Disposition Plan: if pt is afebrile & blood cx neg, can likely discharge home tomorrow  Consults called: n/a Admission status: observation    Wyvonnia Dusky MD [facility] Pager [phone]   If 7PM-7AM, please contact night-coverage [email or web address] Password [facility]  12/11/2019, 3:56 PM

## 2019-12-11 NOTE — Anesthesia Post-op Follow-up Note (Signed)
Anesthesia QCDR form completed.        

## 2019-12-11 NOTE — ED Provider Notes (Signed)
[facility] Emergency Department Provider Note   ____________________________________________   First MD Initiated Contact with Patient 12/11/19 1320     (approximate)  I have reviewed the triage vital signs and the nursing notes.   HISTORY  Chief Complaint Chills   HPI Kristopher Fernandez is a 85 y.o. male with past medical history of CAD, hypertension, CHF, CKD who presents to the ED complaining of chills.  Patient reports that earlier today he had biopsy performed of his prostate and the procedure seem to go well.  He states that about an hour after the procedure, when he was on his way to lunch, he began having chills with shaking.  He had not noticed any fevers and states he was feeling well prior to the procedure.  He has not had a bowel movement since the procedure and has not noticed any blood in his stool, did urinate once and did not notice any dysuria or hematuria.  He complains of some shortness of breath, which he states is chronic for him, but denies any cough or chest pain.  He has not had any abdominal pain, vomiting, or diarrhea and states he was able to eat lunch without difficulty.  He spoke with the urology office and was advised to seek care in the ED.        Past Medical History:  Diagnosis Date  . Anginal pain ([place])   . Aortic stenosis 07/03/2014   Overview:  Mild with calculated aortic valve area of 1.05cm2  . Basal cell carcinoma, ear 03/31/2015  . Bradycardia 07/03/2014  . CHF (congestive heart failure) ([place])   . DDD (degenerative disc disease), lumbar 08/06/2015  . H/O cardiac catheterization 07/03/2014   Overview:  Cypher stent proximal and distal RCA 07/18/05 and TAXUS stent mid LAD 07/20/05 at [facility]  . HTN (hypertension) 07/03/2014  . Hyperlipidemia 07/03/2014  . Lumbosacral radiculopathy at S1 08/22/2015  . MI (myocardial infarction) ([place]) 07/03/2014   Overview:  Mi 07/17/05  . Myocardial infarction ([place])   . Normocytic normochromic  anemia 09/14/2015  . Pacemaker 07/03/2014   Overview:  Dual chamber pacemaker 08/11/11  . Shortness of breath dyspnea   . SOB (shortness of breath) on exertion 05/13/2016    Patient Active Problem List   Diagnosis Date Noted  . Chills 12/11/2019  . Cerebral embolism with cerebral infarction 05/20/2019  . CHF (congestive heart failure) ([place]) 05/17/2019  . Palliative care by specialist   . Goals of care, counseling/discussion   . Acute on chronic systolic congestive heart failure ([place])   . Grief reaction 05/15/2019  . Chest pain 12/26/2018  . Ascending aortic aneurysm ([place]) 02/09/2018  . CKD (chronic kidney disease) stage 3, GFR 30-59 ml/min 02/03/2017  . Status post cardiac catheterization 07/14/2016  . Shortness of breath 05/13/2016  . 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 Normocytic normochromic anemia 09/14/2015  . Lumbosacral radiculopathy at S1 08/22/2015  . DDD (degenerative disc disease), lumbar 08/06/2015  . Basal cell carcinoma, ear 03/31/2015  . History of malignant neoplasm of skin 11/18/2014  . Aortic stenosis 07/03/2014  . Aortic valve stenosis 07/03/2014  . Bradycardia 07/03/2014  . H/O cardiac catheterization 07/03/2014  . HTN (hypertension) 07/03/2014  . Hyperlipidemia 07/03/2014  . Hypertension 07/03/2014  . MI (myocardial infarction) ([place]) 07/03/2014  . Pacemaker 07/03/2014  . Shoulder pain 11/20/2013  . Pain in joint involving pelvic region and thigh 11/27/2012  . Trochanteric bursitis 11/27/2012    Past  Surgical History:  Procedure Laterality Date  . CARDIAC CATHETERIZATION Bilateral 07/14/2016   Procedure: Right/Left Heart Cath and Coronary Angiography;  Surgeon: Isaias Cowman, MD;  Location: [place] [facility];  Service: Cardiovascular;  Laterality: Bilateral;  . CORONARY ANGIOPLASTY    . HERNIA REPAIR    . LEFT HEART CATH AND CORONARY ANGIOGRAPHY N/A 05/09/2019   Procedure: LEFT HEART CATH AND CORONARY  ANGIOGRAPHY;  Surgeon: Corey Skains, MD;  Location: [place] [facility];  Service: Cardiovascular;  Laterality: N/A;  . PROSTATE BIOPSY N/A 12/11/2019   Procedure: PROSTATE BIOPSY;  Surgeon: Abbie Sons, MD;  Location: [facility];  Service: Urology;  Laterality: N/A;  . RIGHT HEART CATH N/A 05/17/2019   Procedure: RIGHT HEART CATH;  Surgeon: Larey Dresser, MD;  Location: [place] [facility];  Service: Cardiovascular;  Laterality: N/A;  . TRANSRECTAL ULTRASOUND N/A 12/11/2019   Procedure: TRANSRECTAL ULTRASOUND;  Surgeon: Abbie Sons, MD;  Location: [facility];  Service: Urology;  Laterality: N/A;    Prior to Admission medications   Medication Sig Start Date End Date Taking? Authorizing Provider  acetaminophen (TYLENOL) 500 MG tablet Take 1,000 mg by mouth every 6 (six) hours as needed for mild pain or moderate pain.     [provider]  amoxicillin (AMOXIL) 875 MG tablet Take 1 tablet (875 mg total) by mouth every 12 (twelve) hours. 12/07/19   Stoioff, Ronda Fairly, MD  apixaban (ELIQUIS) 5 MG TABS tablet Take 1 tablet (5 mg total) by mouth 2 (two) times daily. 05/23/19   Larey Dresser, MD  atorvastatin (LIPITOR) 80 MG tablet Take 80 mg by mouth daily at 6 PM.     [provider]  budesonide-formoterol (SYMBICORT) 160-4.5 MCG/ACT inhaler Inhale 2 puffs into the lungs 2 (two) times daily. Patient taking differently: Inhale 2 puffs into the lungs daily as needed (shortness of breath).  05/17/19 05/16/20  Epifanio Lesches, MD  carvedilol (COREG) 3.125 MG tablet Take 1 tablet (3.125 mg total) by mouth 2 (two) times daily with a meal. Patient taking differently: Take 3.125 mg by mouth 2 (two) times daily.  05/23/19   Larey Dresser, MD  furosemide (LASIX) 20 MG tablet Take 1 tablet (20 mg total) by mouth as needed (weight. Take one tablet if weight increases by 3 pounds overnight or 5 pounds in one week.). Patient not taking: Reported on 12/11/2019 05/23/19 05/22/20  Larey Dresser, MD  meclizine (ANTIVERT) 25 MG tablet Take 1 tablet (25 mg total) by mouth 3 (three) times daily as needed for dizziness. Patient not taking: Reported on 12/05/2019 05/23/19   Larey Dresser, MD  ranolazine (RANEXA) 500 MG 12 hr tablet Take 500 mg by mouth 2 (two) times daily.    [provider]  spironolactone (ALDACTONE) 25 MG tablet Take 1 tablet (25 mg total) by mouth at bedtime. Patient not taking: Reported on 12/05/2019 09/18/19   Larey Dresser, MD  venlafaxine XR (EFFEXOR-XR) 75 MG 24 hr capsule Take 1 capsule (75 mg total) by mouth daily with breakfast. Patient not taking: Reported on 12/05/2019 05/18/19   Epifanio Lesches, MD    Allergies Patient has no known allergies.  Family History  Problem Relation Age of Onset  . Bladder Cancer Neg Hx   . Kidney cancer Neg Hx   . Prostate cancer Neg Hx     Social History Social History   Tobacco Use  . Smoking status: Former Smoker    Years: 10.00  Types: Cigarettes    Quit date: 65    Years since quitting: 44.0  . Smokeless tobacco: Former Network engineer Use Topics  . Alcohol use: No  . Drug use: No    Review of Systems  Constitutional: Positive for chills. Eyes: No visual changes. ENT: No sore throat. Cardiovascular: Denies chest pain. Respiratory: Denies shortness of breath. Gastrointestinal: No abdominal pain.  No nausea, no vomiting.  No diarrhea.  No constipation. Genitourinary: Negative for dysuria. Musculoskeletal: Negative for back pain. Skin: Negative for rash. Neurological: Negative for headaches, focal weakness or numbness.  ____________________________________________   PHYSICAL EXAM:  VITAL SIGNS: ED Triage Vitals  Enc Vitals Group     BP      Pulse      Resp      Temp      Temp src      SpO2      Weight      Height      Head Circumference      Peak Flow      Pain Score      Pain Loc      Pain Edu?      Excl. in [place]?     Constitutional: Alert and  oriented. Eyes: Conjunctivae are normal. Head: Atraumatic. Nose: No congestion/rhinnorhea. Mouth/Throat: Mucous membranes are moist. Neck: Normal ROM Cardiovascular: Normal rate, regular rhythm. Grossly normal heart sounds. Respiratory: Normal respiratory effort.  No retractions. Lungs with expiratory wheezing. Gastrointestinal: Soft and nontender. No distention.  Rectal exam with no obvious bleeding. Genitourinary: deferred Musculoskeletal: No lower extremity tenderness nor edema. Neurologic:  Normal speech and language. No gross focal neurologic deficits are appreciated. Skin:  Skin is warm, dry and intact. No rash noted. Psychiatric: Mood and affect are normal. Speech and behavior are normal.  ____________________________________________   LABS (all labs ordered are listed, but only abnormal results are displayed)  Labs Reviewed  CBC WITH DIFFERENTIAL/PLATELET - Abnormal; Notable for the following components:      Result Value   RBC 4.06 (*)    Hemoglobin 12.5 (*)    HCT 36.6 (*)    Platelets 93 (*)    Lymphs Abs 0.1 (*)    All other components within normal limits  COMPREHENSIVE METABOLIC PANEL - Abnormal; Notable for the following components:   Sodium 134 (*)    CO2 20 (*)    Glucose, Bld 159 (*)    BUN 28 (*)    Creatinine, Ser 1.36 (*)    Calcium 8.7 (*)    Total Protein 6.4 (*)    Albumin 3.2 (*)    GFR calc non Af Amer 47 (*)    GFR calc Af Amer 55 (*)    All other components within normal limits  CULTURE, BLOOD (ROUTINE X 2)  CULTURE, BLOOD (ROUTINE X 2)  SARS CORONAVIRUS 2 (TAT 6-24 HRS)  URINALYSIS, COMPLETE (UACMP) WITH MICROSCOPIC  LACTIC ACID, PLASMA  LACTIC ACID, PLASMA   ____________________________________________  EKG  ED ECG REPORT I, Blake Divine, the attending physician, personally viewed and interpreted this ECG.   Date: 12/11/2019  EKG Time: 13:28  Rate: 88  Rhythm: normal sinus rhythm  Axis: Normal  Intervals:none  ST&T Change:  Inferolateral T wave changes similar to prior   PROCEDURES  Procedure(s) performed (including Critical Care):  Procedures   ____________________________________________   INITIAL IMPRESSION / ASSESSMENT AND PLAN / ED COURSE       85 year old male presents to the ED after developing chills  and shaking shortly after prostate biopsy performed this morning.  He continues to complain of feeling cold, but was afebrile per EMS and remains afebrile here in the ED.  It would be unusual for him to develop infection this quickly after his procedure and he has not noticed any bleeding.  We will check labs as well as chest x-ray and UA for evidence of infectious process.  Vital signs currently not consistent with sepsis.  He does have some mild wheezing noted on exam, will treat with albuterol and give bolus of IV fluids.  Plan to reevaluate and discuss with urology.  Lab work is reassuring.  Case was discussed with Dr. Bernardo Heater of urology, who states that bacteremia is unfortunately common following type of procedure that patient received.  Given his comorbidities, Dr. Bernardo Heater recommends that patient be given IV antibiotics and admitted overnight for observation.  Case discussed with hospitalist, who accepts patient for admission.      ____________________________________________   FINAL CLINICAL IMPRESSION(S) / ED DIAGNOSES  Final diagnoses:  Chills without fever  H/O prostate biopsy     ED Discharge Orders    None       Note:  This document was prepared using Dragon voice recognition software and may include unintentional dictation errors.   Blake Divine, MD 12/11/19 [phone]

## 2019-12-11 NOTE — Discharge Instructions (Signed)
AMBULATORY SURGERY  DISCHARGE INSTRUCTIONS   1) The drugs that you were given will stay in your system until tomorrow so for the next 24 hours you should not:  A) Drive an automobile B) Make any legal decisions C) Drink any alcoholic beverage   2) You may resume regular meals tomorrow.  Today it is better to start with liquids and gradually work up to solid foods.  You may eat anything you prefer, but it is better to start with liquids, then soup and crackers, and gradually work up to solid foods.   3) Please notify your doctor immediately if you have any unusual bleeding, trouble breathing, redness and pain at the surgery site, drainage, fever, or pain not relieved by medication.    4) Additional Instructions:        Please contact your physician with any problems or Same Day Surgery at [phone], Monday through Friday 6 am to 4 pm, or [place] at [facility] number at [phone].Prostate Biopsy Instructions  Some blood from the rectum will be normal for the first 24 hours.  Intermittent blood in the urine is common for 1-2 weeks and in the semen for 4-6 weeks.  Please contact our office for fever >101 degrees or excessive bleeding.  He will be contacted with your pathology results and for any needed follow-up appointments.  If you have any questions or concerns, please feel free to call the office at [phone] or send a Mychart message.

## 2019-12-11 NOTE — ED Notes (Signed)
Pt given dinner tray and is eating.

## 2019-12-11 NOTE — Progress Notes (Signed)
Kristopher Fernandez is status post transrectal ultrasound and biopsy of the prostate this morning for a PSA of 310. The procedure was without complications however several hours after hospital discharge he developed shaking chills and subjective fever. Documented temp was 99. He has been stable in the ED and received an IV Zosyn dose. I spoke with Kristopher Fernandez in the ED and he is feeling better.  The ED physician contacted me and based on his age and comorbidities I recommended overnight observation and the hospital service was kind enough to place in observation.

## 2019-12-11 NOTE — ED Notes (Signed)
ED TO INPATIENT HANDOFF REPORT  ED Nurse Name and Phone #:  [place] Name/Age/Gender Kristopher Fernandez 85 y.o. male Room/Bed: ED35A/ED35A  Code Status   Code Status: DNR  Home/SNF/Other Home Patient oriented to: self, place, time and situation Is this baseline? Yes   Triage Complete: Triage complete  Chief Complaint Chills [R68.83]  Triage Note Pt ems from home for chills, SOB. Pt s/p prostate biopsy this AM. Pt with cardiac hx, stents x 6 and pacemaker.    Allergies No Known Allergies  Level of Care/Admitting Diagnosis ED Disposition    ED Disposition Condition [facility] Area: [place] [[id number]]  Level of Care: Med-Surg [16]  Covid Evaluation: Asymptomatic Screening Protocol (No Symptoms)  Diagnosis: Chills [id number]  Admitting Physician: Wyvonnia Dusky [id number]  Attending Physician: Wyvonnia Dusky [id number]       B Medical/Surgery History Past Medical History:  Diagnosis Date  . Anginal pain ([place])   . Aortic stenosis 07/03/2014   Overview:  Mild with calculated aortic valve area of 1.05cm2  . Basal cell carcinoma, ear 03/31/2015  . Bradycardia 07/03/2014  . CHF (congestive heart failure) ([place])   . DDD (degenerative disc disease), lumbar 08/06/2015  . H/O cardiac catheterization 07/03/2014   Overview:  Cypher stent proximal and distal RCA 07/18/05 and TAXUS stent mid LAD 07/20/05 at [facility]  . HTN (hypertension) 07/03/2014  . Hyperlipidemia 07/03/2014  . Lumbosacral radiculopathy at S1 08/22/2015  . MI (myocardial infarction) ([place]) 07/03/2014   Overview:  Mi 07/17/05  . Myocardial infarction ([place])   . Normocytic normochromic anemia 09/14/2015  . Pacemaker 07/03/2014   Overview:  Dual chamber pacemaker 08/11/11  . Shortness of breath dyspnea   . SOB (shortness of breath) on exertion 05/13/2016   Past Surgical History:  Procedure Laterality Date  . CARDIAC CATHETERIZATION Bilateral 07/14/2016   Procedure:  Right/Left Heart Cath and Coronary Angiography;  Surgeon: Isaias Cowman, MD;  Location: [place] [facility];  Service: Cardiovascular;  Laterality: Bilateral;  . CORONARY ANGIOPLASTY    . HERNIA REPAIR    . LEFT HEART CATH AND CORONARY ANGIOGRAPHY N/A 05/09/2019   Procedure: LEFT HEART CATH AND CORONARY ANGIOGRAPHY;  Surgeon: Corey Skains, MD;  Location: [place] [facility];  Service: Cardiovascular;  Laterality: N/A;  . PROSTATE BIOPSY N/A 12/11/2019   Procedure: PROSTATE BIOPSY;  Surgeon: Abbie Sons, MD;  Location: [facility] ORS;  Service: Urology;  Laterality: N/A;  . RIGHT HEART CATH N/A 05/17/2019   Procedure: RIGHT HEART CATH;  Surgeon: Larey Dresser, MD;  Location: [place] [facility];  Service: Cardiovascular;  Laterality: N/A;  . TRANSRECTAL ULTRASOUND N/A 12/11/2019   Procedure: TRANSRECTAL ULTRASOUND;  Surgeon: Abbie Sons, MD;  Location: [facility] OR[facility];  Service: Urology;  Laterality: N/A;     A IV Location/Drains/Wounds Patient Lines/Drains/Airways Status   Active Line/Drains/Airways    Name:   Placement date:   Placement time:   Site:   Days:   Peripheral IV 12/11/19 Right Hand   12/11/19    1347    Hand   less than 1   Incision (Closed) 12/11/19 Rectum Other (Comment)   12/11/19    0738     less than 1          Intake/Output Last 24 hours  Intake/Output Summary (Last 24 hours) at 12/11/2019 2203 Last data filed at 12/11/2019 1449 Gross per 24 hour  Intake 1000 ml  Output --  Net 1000 ml    Labs/Imaging Results for orders placed or performed during the hospital encounter of 12/11/19 (from the past 48 hour(s))  CBC with Differential     Status: Abnormal   Collection Time: 12/11/19  1:38 PM  Result Value Ref Range   WBC 7.2 4.0 - 10.5 K/uL   RBC 4.06 (L) 4.22 - 5.81 MIL/uL   Hemoglobin 12.5 (L) 13.0 - 17.0 g/dL   HCT 36.6 (L) 39.0 - 52.0 %   MCV 90.1 80.0 - 100.0 fL   MCH 30.8 26.0 - 34.0 pg   MCHC 34.2 30.0 - 36.0 g/dL   RDW 13.0 11.5 - 15.5 %    Platelets 93 (L) 150 - 400 K/uL    Comment: Immature Platelet Fraction may be clinically indicated, consider ordering this additional test [id number]    nRBC 0.0 0.0 - 0.2 %   Neutrophils Relative % 92 %   Neutro Abs 6.7 1.7 - 7.7 K/uL   Lymphocytes Relative 2 %   Lymphs Abs 0.1 (L) 0.7 - 4.0 K/uL   Monocytes Relative 5 %   Monocytes Absolute 0.4 0.1 - 1.0 K/uL   Eosinophils Relative 0 %   Eosinophils Absolute 0.0 0.0 - 0.5 K/uL   Basophils Relative 0 %   Basophils Absolute 0.0 0.0 - 0.1 K/uL   Immature Granulocytes 1 %   Abs Immature Granulocytes 0.04 0.00 - 0.07 K/uL    Comment: Performed at [facility], [address] [place], [place] [address]  Comprehensive metabolic panel     Status: Abnormal   Collection Time: 12/11/19  1:38 PM  Result Value Ref Range   Sodium 134 (L) 135 - 145 mmol/L   Potassium 4.0 3.5 - 5.1 mmol/L   Chloride 103 98 - 111 mmol/L   CO2 20 (L) 22 - 32 mmol/L   Glucose, Bld 159 (H) 70 - 99 mg/dL   BUN 28 (H) 8 - 23 mg/dL   Creatinine, Ser 1.36 (H) 0.61 - 1.24 mg/dL   Calcium 8.7 (L) 8.9 - 10.3 mg/dL   Total Protein 6.4 (L) 6.5 - 8.1 g/dL   Albumin 3.2 (L) 3.5 - 5.0 g/dL   AST 24 15 - 41 U/L   ALT 14 0 - 44 U/L   Alkaline Phosphatase 88 38 - 126 U/L   Total Bilirubin 0.9 0.3 - 1.2 mg/dL   GFR calc non Af Amer 47 (L) >60 mL/min   GFR calc Af Amer 55 (L) >60 mL/min   Anion gap 11 5 - 15    Comment: Performed at [facility], [address] [place], [place] [address]  Urinalysis, Complete w Microscopic     Status: Abnormal   Collection Time: 12/11/19  1:39 PM  Result Value Ref Range   Color, Urine YELLOW (A) YELLOW   APPearance HAZY (A) CLEAR   Specific Gravity, Urine 1.014 1.005 - 1.030   pH 5.0 5.0 - 8.0   Glucose, UA NEGATIVE NEGATIVE mg/dL   Hgb urine dipstick LARGE (A) NEGATIVE   Bilirubin Urine NEGATIVE NEGATIVE   Ketones, ur NEGATIVE NEGATIVE mg/dL   Protein, ur NEGATIVE NEGATIVE mg/dL   Nitrite NEGATIVE NEGATIVE    Leukocytes,Ua TRACE (A) NEGATIVE   RBC / HPF >50 (H) 0 - 5 RBC/hpf   WBC, UA 11-20 0 - 5 WBC/hpf   Bacteria, UA NONE SEEN NONE SEEN   Squamous Epithelial / LPF 0-5 0 - 5   Mucus PRESENT     Comment: Performed  at [facility], [address] [place], [place] [address]  Lactic acid, plasma     Status: None   Collection Time: 12/11/19  3:33 PM  Result Value Ref Range   Lactic Acid, Venous 1.7 0.5 - 1.9 mmol/L    Comment: Performed at [facility], [address] [place], [place] [address]  Lactic acid, plasma     Status: None   Collection Time: 12/11/19  5:11 PM  Result Value Ref Range   Lactic Acid, Venous 1.5 0.5 - 1.9 mmol/L    Comment: Performed at [facility], [address] [place], [place] [address]   [place] Transrectal Complete  Result Date: 12/11/2019 Please see Notes tab for imaging impression.  DG Chest Portable 1 View  Result Date: 12/11/2019 CLINICAL DATA:  Chills EXAM: PORTABLE CHEST 1 VIEW COMPARISON:  05/18/2019 FINDINGS: Right basilar scarring. Blunting of the left costophrenic angle likely reflects chronic pleural thickening. No new consolidation or edema. Stable cardiomediastinal contours. Left chest wall dual lead pacemaker is present. IMPRESSION: No acute process in the chest. Electronically Signed   By: Macy Mis M.D.   On: 12/11/2019 14:16   [place] PROSTATE BIOPSY MULTIPLE  Result Date: 12/11/2019 Please see Notes tab for imaging impression.   Pending Labs Unresulted Labs (From admission, onward)    Start     Ordered   12/12/19 0500  CBC  Tomorrow morning,   STAT     12/11/19 1605   12/12/19 [id number]  Basic metabolic panel  Tomorrow morning,   STAT     12/11/19 1605   12/11/19 1658  Urine Culture  Once,   STAT     12/11/19 1657   12/11/19 1538  SARS CORONAVIRUS 2 (TAT 6-24 HRS) Nasopharyngeal Nasopharyngeal Swab  (Tier 3 (TAT 6-24 hrs))  Once,   STAT    Question Answer Comment  Is this test for diagnosis or screening  Screening   Symptomatic for COVID-19 as defined by CDC No   Hospitalized for COVID-19 No   Admitted to ICU for COVID-19 No   Previously tested for COVID-19 Yes   Resident in a congregate (group) care setting No   Employed in healthcare setting No      12/11/19 1537   12/11/19 1532  Culture, blood (routine x 2)  BLOOD CULTURE X 2,   STAT     12/11/19 1532          Vitals/Pain Today's Vitals   12/11/19 1630 12/11/19 1700 12/11/19 1730 12/11/19 2130  BP: (!) 117/58 (!) 102/51 (!) 100/55 (!) 116/55  Pulse: 79 78 72 61  Resp: 20 (!) 25 15 18   Temp:      TempSrc:      SpO2: 95% 91% 91% 93%  Weight:      Height:      PainSc:        Isolation Precautions No active isolations  Medications Medications  sodium chloride flush (NS) 0.9 % injection 3 mL (has no administration in time range)  acetaminophen (TYLENOL) tablet 650 mg (has no administration in time range)    Or  acetaminophen (TYLENOL) suppository 650 mg (has no administration in time range)  traMADol (ULTRAM) tablet 50 mg (has no administration in time range)  senna-docusate (Senokot-S) tablet 1 tablet (has no administration in time range)  atorvastatin (LIPITOR) tablet 80 mg (80 mg Oral Given 12/11/19 1730)  carvedilol (COREG) tablet 3.125 mg (3.125 mg Oral Not Given 12/11/19 2113)  ranolazine (RANEXA) 12 hr  tablet 500 mg (has no administration in time range)  venlafaxine XR (EFFEXOR-XR) 24 hr capsule 75 mg (has no administration in time range)  apixaban (ELIQUIS) tablet 5 mg (has no administration in time range)  ipratropium-albuterol (DUONEB) 0.5-2.5 (3) MG/3ML nebulizer solution 3 mL (has no administration in time range)  morphine 2 MG/ML injection 2 mg (has no administration in time range)  lactated ringers bolus 1,000 mL (0 mLs Intravenous Stopped 12/11/19 1449)  albuterol (VENTOLIN HFA) 108 (90 Base) MCG/ACT inhaler 2 puff (2 puffs Inhalation Given 12/11/19 1451)  piperacillin-tazobactam (ZOSYN) IVPB 3.375 g (0  g Intravenous Stopped 12/11/19 1711)    Mobility walks Fernandez fall risk   Focused Assessments n/a   R Recommendations: See Admitting Provider Note  Report given to:   Additional Notes: n/a

## 2019-12-11 NOTE — H&P (Signed)
12/11/2019 7:30 AM   Kristopher Fernandez 03-26-1932 [id number]  Referring provider: No referring provider defined for this encounter.  No chief complaint on file.   HPI: 85 y.o. male seen in mid November 2020 for PSA of >150.  Urinalysis showed mild pyuria however urine culture was negative.  He has mild lower urinary tract symptoms.  PSA was repeated and was 310.   PMH: Past Medical History:  Diagnosis Date  . Anginal pain ([place])   . Aortic stenosis 07/03/2014   Overview:  Mild with calculated aortic valve area of 1.05cm2  . Basal cell carcinoma, ear 03/31/2015  . Bradycardia 07/03/2014  . CHF (congestive heart failure) ([place])   . DDD (degenerative disc disease), lumbar 08/06/2015  . H/O cardiac catheterization 07/03/2014   Overview:  Cypher stent proximal and distal RCA 07/18/05 and TAXUS stent mid LAD 07/20/05 at [facility]  . HTN (hypertension) 07/03/2014  . Hyperlipidemia 07/03/2014  . Lumbosacral radiculopathy at S1 08/22/2015  . MI (myocardial infarction) ([place]) 07/03/2014   Overview:  Mi 07/17/05  . Myocardial infarction ([place])   . Normocytic normochromic anemia 09/14/2015  . Pacemaker 07/03/2014   Overview:  Dual chamber pacemaker 08/11/11  . Shortness of breath dyspnea   . SOB (shortness of breath) on exertion 05/13/2016    Surgical History: Past Surgical History:  Procedure Laterality Date  . CARDIAC CATHETERIZATION Bilateral 07/14/2016   Procedure: Right/Left Heart Cath and Coronary Angiography;  Surgeon: Isaias Cowman, MD;  Location: [place] [facility];  Service: Cardiovascular;  Laterality: Bilateral;  . CORONARY ANGIOPLASTY    . HERNIA REPAIR    . LEFT HEART CATH AND CORONARY ANGIOGRAPHY N/A 05/09/2019   Procedure: LEFT HEART CATH AND CORONARY ANGIOGRAPHY;  Surgeon: Corey Skains, MD;  Location: [place] [facility];  Service: Cardiovascular;  Laterality: N/A;  . RIGHT HEART CATH N/A 05/17/2019   Procedure: RIGHT HEART CATH;  Surgeon: Larey Dresser, MD;  Location: [place] [facility];  Service: Cardiovascular;  Laterality: N/A;    Home Medications:  apixaban 5 MG Tabs tablet Commonly known as: ELIQUIS Take 1 tablet (5 mg total) by mouth 2 (two) times daily.   atorvastatin 80 MG tablet Commonly known as: LIPITOR Take 80 mg by mouth daily.   budesonide-formoterol 160-4.5 MCG/ACT inhaler Commonly known as: Symbicort Inhale 2 puffs into the lungs 2 (two) times daily.   carvedilol 3.125 MG tablet Commonly known as: COREG Take 1 tablet (3.125 mg total) by mouth 2 (two) times daily with a meal.   furosemide 20 MG tablet Commonly known as: Lasix Take 1 tablet (20 mg total) by mouth as needed (weight. Take one tablet if weight increases by 3 pounds overnight or 5 pounds in one week.).   meclizine 25 MG tablet Commonly known as: ANTIVERT Take 1 tablet (25 mg total) by mouth 3 (three) times daily as needed for dizziness.   ranolazine 500 MG 12 hr tablet Commonly known as: RANEXA Take 500 mg by mouth 2 (two) times daily.   spironolactone 25 MG tablet Commonly known as: ALDACTONE Take 1 tablet (25 mg total) by mouth at bedtime.   venlafaxine XR 75 MG 24 hr capsule Commonly known as: EFFEXOR-XR Take 1 capsule (75 mg total) by mouth daily with breakfast.    Allergies: No Known Allergies  Family History: Family History  Problem Relation Age of Onset  . Bladder Cancer Neg Hx   . Kidney cancer Neg Hx   . Prostate cancer Neg Hx  Social History:  reports that he quit smoking about 44 years ago. His smoking use included cigarettes. He quit after 10.00 years of use. He has quit using smokeless tobacco. He reports that he does not drink alcohol or use drugs.  ROS: No significant change 11/08/2019  Physical Exam: BP 126/75   Pulse 69   Temp 98.7 F (37.1 C) (Tympanic)   Resp 10   SpO2 95%   Constitutional:  Alert and oriented, No acute distress. HEENT: [place] AT, moist mucus membranes.  Trachea midline, no masses. Cardiovascular: No  clubbing, cyanosis, or edema. Respiratory: Normal respiratory effort, no increased work of breathing. GI: Abdomen is soft, nontender, nondistended, no abdominal masses GU: Prostate 50g, flat smooth without nodules or induration Lymph: No cervical or inguinal lymphadenopathy. Skin: No rashes, bruises or suspicious lesions. Neurologic: Grossly intact, no focal deficits, moving all 4 extremities. Psychiatric: Normal mood and affect.   Assessment & Plan:   85 y.o. male with significant PSA elevation suspicious for high-grade prostate cancer.  He presents for transrectal sound the prostate with prostate biopsy under sedation.  The procedure has been discussed in detail including potential risks bleeding and infection/sepsis.  He indicated all questions were answered and he desires to proceed.   Abbie Sons, [place] [address], [place],  [address] [phone]

## 2019-12-11 NOTE — ED Notes (Signed)
Pt ambulatory to the bathroom with a slow but steady gait.

## 2019-12-11 NOTE — Telephone Encounter (Signed)
Patient's daughter called @ 11:45 and stated that her father has chills and was shivering uncontrollably and I was unable to reach the MD after speaking with the patient's daughter she advised me that he was hot to the touch and I spoke with Judson Roch our [place] and [facility] and was told to have the patient go to the ER now. Patient's daughter was informed to take him now. She voiced understanding and was going to take him now.    Sharyn Lull

## 2019-12-11 NOTE — Transfer of Care (Signed)
Immediate Anesthesia Transfer of Care Note  Patient: Kristopher Fernandez  Procedure(s) Performed: PROSTATE BIOPSY (N/A Prostate) TRANSRECTAL ULTRASOUND (N/A Prostate)  Patient Location: PACU  Anesthesia Type:General  Level of Consciousness: sedated  Airway & Oxygen Therapy: Patient Spontanous Breathing and Patient connected to face mask oxygen  Post-op Assessment: Report given to RN and Post -op Vital signs reviewed and stable  Post vital signs: Reviewed and stable  Last Vitals:  Vitals Value Taken Time  BP 105/50 12/11/19 0809  Temp    Pulse 54 12/11/19 0812  Resp 13 12/11/19 0812  SpO2 100 % 12/11/19 0812  Vitals shown include unvalidated device data.  Last Pain:  Vitals:   12/11/19 0646  TempSrc: Tympanic  PainSc: 0-No pain         Complications: No apparent anesthesia complications

## 2019-12-11 NOTE — ED Triage Notes (Signed)
Pt ems from home for chills, SOB. Pt s/p prostate biopsy this AM. Pt with cardiac hx, stents x 6 and pacemaker.

## 2019-12-12 ENCOUNTER — Encounter: Payer: Self-pay | Admitting: Internal Medicine

## 2019-12-12 DIAGNOSIS — D696 Thrombocytopenia, unspecified: Secondary | ICD-10-CM | POA: Diagnosis not present

## 2019-12-12 DIAGNOSIS — R6883 Chills (without fever): Secondary | ICD-10-CM | POA: Diagnosis not present

## 2019-12-12 LAB — CBC
HCT: 32.5 % — ABNORMAL LOW (ref 39.0–52.0)
Hemoglobin: 11.2 g/dL — ABNORMAL LOW (ref 13.0–17.0)
MCH: 29.9 pg (ref 26.0–34.0)
MCHC: 34.5 g/dL (ref 30.0–36.0)
MCV: 86.9 fL (ref 80.0–100.0)
Platelets: 81 10*3/uL — ABNORMAL LOW (ref 150–400)
RBC: 3.74 MIL/uL — ABNORMAL LOW (ref 4.22–5.81)
RDW: 13.3 % (ref 11.5–15.5)
WBC: 6.4 10*3/uL (ref 4.0–10.5)
nRBC: 0 % (ref 0.0–0.2)

## 2019-12-12 LAB — BASIC METABOLIC PANEL
Anion gap: 8 (ref 5–15)
BUN: 27 mg/dL — ABNORMAL HIGH (ref 8–23)
CO2: 25 mmol/L (ref 22–32)
Calcium: 8.7 mg/dL — ABNORMAL LOW (ref 8.9–10.3)
Chloride: 102 mmol/L (ref 98–111)
Creatinine, Ser: 1.49 mg/dL — ABNORMAL HIGH (ref 0.61–1.24)
GFR calc Af Amer: 49 mL/min — ABNORMAL LOW (ref >60)
GFR calc non Af Amer: 42 mL/min — ABNORMAL LOW (ref >60)
Glucose, Bld: 113 mg/dL — ABNORMAL HIGH (ref 70–99)
Potassium: 4.1 mmol/L (ref 3.5–5.1)
Sodium: 135 mmol/L (ref 135–145)

## 2019-12-12 NOTE — Evaluation (Signed)
Physical Therapy Evaluation Patient Details Name: Kristopher Fernandez MRN: [id number] DOB: [birth year] Today's Date: 12/12/2019   History of Present Illness  85 yo male who presented to ED with subjective fever after a prostate biopsy and c/o rectum pain. PMH includes HLD, HTN, depression, pacemaker, CAD s/p stents, chronic combined CHF  Clinical Impression  Pt is an 85 yo male being observed for above. Pt received in supine appearing to feel extremely bad and weak/fatigued. Pt reporting feeling crummy, extremely weak and reporting 8/10 pain in low back and rectum with a headache as well. Pt lives alone with 2STE and his daughter lives nearby and can assist frequently however not 24/7. Pt presents with strength grossly 2-/5, decreased balance and activity tolerance. Pt required min A to get EOB. Once EOB, pt reporting extreme "swimmy headedness" that did not resolve after few minutes of static sitting EOB. Pt unable to keep eyes open due to feeling so bad. Pt extremely fatigued. Further OOB mobility deferred. Pt currently reporting and agreeing that he does not feel able to go home. Pt will benefit from further acute PT to improve deficits to improve safety with functional mobility. Currently based on current presentation recommendation for SNF following hospital discharge but expect pt to improve tremendously when pt begins to feel better and progress functional mobility and be appropriate for home health PT.     Follow Up Recommendations SNF;Other (comment)(Home health PT depending on progression of mobility acutely)    Equipment Recommendations  Rolling walker with 5" wheels    Recommendations for Other Services       Precautions / Restrictions Precautions Precautions: Fall Restrictions Weight Bearing Restrictions: No      Mobility  Bed Mobility Overal bed mobility: Needs Assistance Bed Mobility: Rolling;Sidelying to Sit;Sit to Supine Rolling: Min guard Sidelying to sit: Min assist;HOB  elevated   Sit to supine: Min assist;HOB elevated   General bed mobility comments: pt able to roll to side with bed rails, min A to get fully EOB and return to supine needing assist for trunk elevation and LE advancement returning to supine  Transfers                 General transfer comment: deferred today for pt safety, pt complaining of severe "swimmy headedness" that did not improve after couple minutes static sitting EOB  Ambulation/Gait             General Gait Details: deferred  Stairs            Wheelchair Mobility    Modified Rankin (Stroke Patients Only)       Balance Overall balance assessment: Needs assistance Sitting-balance support: Feet supported;Single extremity supported Sitting balance-Leahy Scale: Fair Sitting balance - Comments: steady sitting EOB with min guard                                     Pertinent Vitals/Pain Pain Assessment: 0-10 Pain Score: 8  Pain Location: headache, low back, rectum Pain Descriptors / Indicators: Aching;Grimacing;Sore;Discomfort;Moaning Pain Intervention(s): Limited activity within patient's tolerance;Monitored during session;Repositioned    Home Living Family/patient expects to be discharged to:: Private residence Living Arrangements: Alone Available Help at Discharge: Family;Other (Comment)(daughter lives nearby) Type of Home: House Home Access: Stairs to enter Entrance Stairs-Rails: Right Entrance Stairs-Number of Steps: 2 Home Layout: One level Home Equipment: Grab bars - tub/shower;Grab bars - toilet;Cane - single point;Shower seat Additional Comments:  really doesnt use shower chair, ambulates with cane sometimes doesnt use and just carries for safety, uses SPC for community ambulation and stairs    Prior Function Level of Independence: Independent with assistive device(s)         Comments: pt reports he has been doing well and independent with ADLs, denies any recent falls      Hand Dominance        Extremity/Trunk Assessment   Upper Extremity Assessment Upper Extremity Assessment: Generalized weakness(grip strength grossly 2-/5, unable to perform should flexion greater than half of ROM in supine with HOB elevated)    Lower Extremity Assessment Lower Extremity Assessment: Generalized weakness(LE strength grossly 2/2-/5)       Communication   Communication: No difficulties  Cognition Arousal/Alertness: Awake/alert Behavior During Therapy: WFL for tasks assessed/performed Overall Cognitive Status: Within Functional Limits for tasks assessed                                        General Comments      Exercises     Assessment/Plan    PT Assessment Patient needs continued PT services  PT Problem List Decreased strength;Decreased mobility;Decreased range of motion;Decreased activity tolerance;Decreased balance;Decreased knowledge of use of DME;Pain       PT Treatment Interventions DME instruction;Therapeutic exercise;Balance training;Stair training;Neuromuscular re-education;Gait training;Functional mobility training;Patient/family education;Therapeutic activities    PT Goals (Current goals can be found in the Care Plan section)  Acute Rehab PT Goals Patient Stated Goal: feel better PT Goal Formulation: With patient Time For Goal Achievement: 12/26/19 Potential to Achieve Goals: Good    Frequency Min 2X/week   Barriers to discharge Decreased caregiver support daughter not available 24/7    Co-evaluation               AM-PAC PT "6 Clicks" Mobility  Outcome Measure Help needed turning from your back to your side while in a flat bed without using bedrails?: A Little Help needed moving from lying on your back to sitting on the side of a flat bed without using bedrails?: A Little Help needed moving to and from a bed to a chair (including a wheelchair)?: A Lot Help needed standing up from a chair using your arms  (e.g., wheelchair or bedside chair)?: A Lot Help needed to walk in hospital room?: A Lot Help needed climbing 3-5 steps with a railing? : A Lot 6 Click Score: 14    End of Session Equipment Utilized During Treatment: Gait belt Activity Tolerance: Patient limited by fatigue;Patient limited by lethargy;Other (comment);Patient limited by pain(dizziness/lightheadedness) Patient left: in bed;with call bell/phone within reach;with bed alarm set Nurse Communication: Mobility status PT Visit Diagnosis: Muscle weakness (generalized) (M62.81);Difficulty in walking, not elsewhere classified (R26.2);Other abnormalities of gait and mobility (R26.89)    Time: [id number] PT Time Calculation (min) (ACUTE ONLY): 19 min   Charges:   PT Evaluation $PT Eval Moderate Complexity: [address] PT, DPT 12:21 PM,12/12/19 [phone]   [facility] 12/12/2019, 12:18 PM

## 2019-12-12 NOTE — Anesthesia Postprocedure Evaluation (Signed)
Anesthesia Post Note  Patient: Kristopher Fernandez  Procedure(s) Performed: PROSTATE BIOPSY (N/A Prostate) TRANSRECTAL ULTRASOUND (N/A Prostate)  Patient location during evaluation: PACU Anesthesia Type: MAC Level of consciousness: awake and alert Pain management: pain level controlled Vital Signs Assessment: post-procedure vital signs reviewed and stable Respiratory status: spontaneous breathing, nonlabored ventilation, respiratory function stable and patient connected to nasal cannula oxygen Cardiovascular status: blood pressure returned to baseline and stable Postop Assessment: no apparent nausea or vomiting Anesthetic complications: no     Last Vitals:  Vitals:   12/11/19 0839 12/11/19 0849  BP: 115/73 106/60  Pulse: 67 74  Resp: 19 18  Temp: 36.4 C (!) 36.3 C  SpO2: 98% 97%    Last Pain:  Vitals:   12/11/19 0849  TempSrc: Tympanic  PainSc: 0-No pain                 Molli Barrows

## 2019-12-12 NOTE — TOC Initial Note (Signed)
Transition of Care [facility]) - Initial/Assessment Note    Patient Details  Name: Kristopher Fernandez MRN: [id number] Date of Birth: 04/11/1932  Transition of Care [facility]) CM/SW Contact:    Shelbie Hutching, RN Phone Number: 12/12/2019, 4:05 PM  Clinical Narrative:                 Patient placed under observation for chills after having a prostate biopsy.  Patient reports he has never been so weak.  Patient is from home and lives alone.  Patient reports he is independent in ADL's and very active at home.  Patient has a walker, wheelchair and cane, he walks with the cane.  Patient does drive.   He does not want to go to skilled nursing for rehab and prefers to go home with home health.  Patient requests same home health agency as last time when he was discharged from [facility].  Patient had [facility], referral given to [facility] with [facility] they are able to accept him as a patient again.   Patient has a daughter, [name] that will be able to stay with the patient if needed at discharge.   RNCM will cont to follow.   Expected Discharge Plan: [place] Barriers to Discharge: Continued Medical Work up   Patient Goals and CMS Choice Patient states their goals for this hospitalization and ongoing recovery are:: to get stronger CMS Medicare.gov Compare Post Acute Care list provided to:: Patient Choice offered to / list presented to : Patient  Expected Discharge Plan and Services Expected Discharge Plan: [place]   Discharge Planning Services: CM Consult Post Acute Care Choice: [place] arrangements for the past 2 months: Single Family Home Expected Discharge Date: 12/12/19                         HH Arranged: PT, OT HH Agency: [facility] Date [place]: 12/12/19 Time [place]: [place] Representative spoke with at [place] Arrangements/Services Living arrangements for the past 2 months: [place]   Patient language and need for interpreter reviewed:: Yes Do you feel safe going back to the place where you live?: Yes      Need for Family Participation in Patient Care: Yes (Comment) Care giver support system in place?: Yes (comment)(daughter and grandchildren) Current home services: DME(walker, cane and wheelchair) Criminal Activity/Legal Involvement Pertinent to Current Situation/Hospitalization: No - Comment as needed  Activities of Daily Living Home Assistive Devices/Equipment: Cane (specify quad or straight) ADL Screening (condition at time of admission) Patient's cognitive ability adequate to safely complete daily activities?: Yes Is the patient deaf or have difficulty hearing?: Yes(left ear) Does the patient have difficulty seeing, even when wearing glasses/contacts?: No Does the patient have difficulty concentrating, remembering, or making decisions?: No Patient able to express need for assistance with ADLs?: Yes Does the patient have difficulty dressing or bathing?: No Independently performs ADLs?: Yes (appropriate for developmental age) Does the patient have difficulty walking or climbing stairs?: Yes Weakness of Legs: Both Weakness of Arms/Hands: Both  Permission Sought/Granted Permission sought to share information with : Case Manager, Family Supports Permission granted to share information with : Yes, Verbal Permission Granted     Permission granted to share info w AGENCY: [facility]        Emotional Assessment Appearance:: Appears stated age Attitude/Demeanor/Rapport: Engaged Affect (typically observed): Accepting Orientation: : Oriented to Self,  Oriented to Place, Oriented to  Time, Oriented to Situation Alcohol / Substance Use: Not Applicable Psych Involvement: No (comment)  Admission diagnosis:  Chills [R68.83] Chills without fever [R68.83] H/O prostate biopsy [Z98.890] Patient Active Problem List   Diagnosis Date Noted  . Chills without fever  12/11/2019  . Rectal pain 12/11/2019  . Thrombocytopenia ([place]) 12/11/2019  . Cerebral embolism with cerebral infarction 05/20/2019  . CHF (congestive heart failure) ([place]) 05/17/2019  . Palliative care by specialist   . Goals of care, counseling/discussion   . Acute on chronic systolic congestive heart failure ([place])   . Grief reaction 05/15/2019  . Chest pain 12/26/2018  . Ascending aortic aneurysm ([place]) 02/09/2018  . CKD (chronic kidney disease) stage 3, GFR 30-59 ml/min 02/03/2017  . Status post cardiac catheterization 07/14/2016  . Shortness of breath 05/13/2016  . 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 Normocytic normochromic anemia 09/14/2015  . Lumbosacral radiculopathy at S1 08/22/2015  . DDD (degenerative disc disease), lumbar 08/06/2015  . Basal cell carcinoma, ear 03/31/2015  . History of malignant neoplasm of skin 11/18/2014  . Aortic stenosis 07/03/2014  . Aortic valve stenosis 07/03/2014  . Bradycardia 07/03/2014  . H/O cardiac catheterization 07/03/2014  . HTN (hypertension) 07/03/2014  . Hyperlipidemia 07/03/2014  . Hypertension 07/03/2014  . MI (myocardial infarction) ([place]) 07/03/2014  . Pacemaker 07/03/2014  . Shoulder pain 11/20/2013  . Pain in joint involving pelvic region and thigh 11/27/2012  . Trochanteric bursitis 11/27/2012   PCP:  Tracie Harrier, MD Pharmacy:   [place], [place] - [place] [address] Phone: [phone] Fax: [phone]     Social Determinants of Health ([place]) Interventions    Readmission Risk Interventions Readmission Risk Prevention Plan 05/16/2019  Transportation Screening Complete  PCP or Specialist Appt within 5-7 Days Complete  Home Care Screening Complete  Medication Review (RN CM) Complete  Some recent data might be hidden

## 2019-12-12 NOTE — Progress Notes (Signed)
Progress Note    Kristopher Fernandez  [id number] DOB: 1932/03/26  DOA: 12/11/2019 PCP: Tracie Harrier, MD       Assessment/Plan:   Active Problems:   Chills without fever   Rectal pain   Thrombocytopenia (HCC)   Body mass index is 24.14 kg/m.   Chills s/p transrectal ultrasound and biopsy of the prostate on 12/11/2019: Blood cultures pending.  No antibiotics for now.  Generalized weakness: Patient was evaluated by PT today and SNF was recommended.  Consult OT as well.  Thrombocytopenia: Chart review shows patient has had a history of thrombocytopenia in the past but platelet count has worsened.  Monitor CBC.  CKD stage IIIa: Creatinine is stable.  Hypertension: Continue antihypertensive  Chronic systolic and diastolic CHF: Compensated.  CAD and history of stroke: Continue Eliquis and Lipitor.  He is also on Ranexa for CAD.  Abnormal urinalysis: This is probably due to recent prostate biopsy.  No antibiotics for now.   Family Communication/Anticipated D/C date and plan/Code Status   DVT prophylaxis: Eliquis Code Status: Full code Family Communication: Plan discussed with patient Disposition Plan: Possible discharge to home versus SNF in 1 to 2 days.      Subjective:   He complains of generalized weakness.  He said he had chills earlier this morning but it has improved.  Objective:    Vitals:   12/12/19 0819 12/12/19 0822 12/12/19 0822 12/12/19 1029  BP: (!) 111/50  (!) 111/50   Pulse: 66 66 66   Resp:   16   Temp:   98.4 F (36.9 C) 98.5 F (36.9 C)  TempSrc:   Oral Oral  SpO2:   96%   Weight:      Height:        Intake/Output Summary (Last 24 hours) at 12/12/2019 1544 Last data filed at 12/12/2019 1300 Gross per 24 hour  Intake 600 ml  Output 126 ml  Net 474 ml   Filed Weights   12/11/19 1342  Weight: 80.7 kg    Exam:  GEN: NAD SKIN: No rash EYES: EOMI, PERRLA ENT: MMM CV: RRR PULM: CTA B ABD: soft, ND, NT, +BS CNS: AAO  x 3, non focal EXT: No edema or tenderness   Data Reviewed:   I have personally reviewed following labs and imaging studies:  Labs: Labs show the following:   Basic Metabolic Panel: Recent Labs  Lab 12/11/19 1338 12/12/19 0814  NA 134* 135  K 4.0 4.1  CL 103 102  CO2 20* 25  GLUCOSE 159* 113*  BUN 28* 27*  CREATININE 1.36* 1.49*  CALCIUM 8.7* 8.7*   GFR Estimated Creatinine Clearance: 39.8 mL/min (A) (by C-G formula based on SCr of 1.49 mg/dL (H)). Liver Function Tests: Recent Labs  Lab 12/11/19 1338  AST 24  ALT 14  ALKPHOS 88  BILITOT 0.9  PROT 6.4*  ALBUMIN 3.2*   No results for input(s): LIPASE, AMYLASE in the last 168 hours. No results for input(s): AMMONIA in the last 168 hours. Coagulation profile No results for input(s): INR, PROTIME in the last 168 hours.  CBC: Recent Labs  Lab 12/11/19 1338 12/12/19 0814  WBC 7.2 6.4  NEUTROABS 6.7  --   HGB 12.5* 11.2*  HCT 36.6* 32.5*  MCV 90.1 86.9  PLT 93* 81*   Cardiac Enzymes: No results for input(s): CKTOTAL, CKMB, CKMBINDEX, TROPONINI in the last 168 hours. BNP (last 3 results) No results for input(s): PROBNP in the last 8760 hours.  CBG: No results for input(s): GLUCAP in the last 168 hours. D-Dimer: No results for input(s): DDIMER in the last 72 hours. Hgb A1c: No results for input(s): HGBA1C in the last 72 hours. Lipid Profile: No results for input(s): CHOL, HDL, LDLCALC, TRIG, CHOLHDL, LDLDIRECT in the last 72 hours. Thyroid function studies: No results for input(s): TSH, T4TOTAL, T3FREE, THYROIDAB in the last 72 hours.  Invalid input(s): FREET3 Anemia work up: No results for input(s): VITAMINB12, FOLATE, FERRITIN, TIBC, IRON, RETICCTPCT in the last 72 hours. Sepsis Labs: Recent Labs  Lab 12/11/19 1338 12/11/19 1533 12/11/19 1711 12/12/19 0814  WBC 7.2  --   --  6.4  LATICACIDVEN  --  1.7 1.5  --     Microbiology Recent Results (from the past 240 hour(s))  CULTURE, URINE  COMPREHENSIVE     Status: Abnormal   Collection Time: 12/03/19 12:59 PM   Specimen: Urine   UR  Result Value Ref Range Status   Urine Culture, Comprehensive Final report (A)  Final   Organism ID, Bacteria Enterococcus faecalis (A)  Final    Comment: 10,000-25,000 colony forming units per mL   Organism ID, Bacteria Comment  Final    Comment: Mixed urogenital flora 10,000-25,000 colony forming units per mL    ANTIMICROBIAL SUSCEPTIBILITY Comment  Final    Comment:       ** S = Susceptible; I = Intermediate; R = Resistant **                    P = Positive; N = Negative             MICS are expressed in micrograms per mL    Antibiotic                 RSLT#1    RSLT#2    RSLT#3    RSLT#4 Ciprofloxacin                  S Levofloxacin                   S Nitrofurantoin                 S Penicillin                     S Tetracycline                   R Vancomycin                     S   Microscopic Examination     Status: Abnormal   Collection Time: 12/03/19 12:59 PM   URINE  Result Value Ref Range Status   WBC, UA >30 (A) 0 - 5 /hpf Final   RBC None seen 0 - 2 /hpf Final   Epithelial Cells (non renal) 0-10 0 - 10 /hpf Final   Bacteria, UA None seen None seen/Few Final  SARS CORONAVIRUS 2 (TAT 6-24 HRS) Nasopharyngeal Nasopharyngeal Swab     Status: None   Collection Time: 12/06/19 12:56 PM   Specimen: Nasopharyngeal Swab  Result Value Ref Range Status   SARS Coronavirus 2 NEGATIVE NEGATIVE Final    Comment: (NOTE) SARS-CoV-2 target nucleic acids are NOT DETECTED. The SARS-CoV-2 RNA is generally detectable in upper and lower respiratory specimens during the acute phase of infection. Negative results do not preclude SARS-CoV-2 infection, do not rule out co-infections with other pathogens, and should  not be used as the sole basis for treatment or other patient management decisions. Negative results must be combined with clinical observations, patient history, and epidemiological  information. The expected result is Negative. Fact Sheet for Patients: SugarRoll.be Fact Sheet for Healthcare Providers: https://www.woods-mathews.com/ This test is not yet approved or cleared by the [place] FDA and  has been authorized for detection and/or diagnosis of SARS-CoV-2 by FDA under an Emergency Use Authorization (EUA). This EUA will remain  in effect (meaning this test can be used) for the duration of the COVID-19 declaration under Section 56 4(b)(1) of the Act, 21 U.S.C. section 360bbb-3(b)(1), unless the authorization is terminated or revoked sooner. Performed at [facility], [place] [address], [place], [place] [address]   Culture, blood (routine x 2)     Status: None (Preliminary result)   Collection Time: 12/11/19  3:32 PM   Specimen: BLOOD  Result Value Ref Range Status   Specimen Description BLOOD BLOOD LEFT HAND  Final   Special Requests   Final    BOTTLES DRAWN AEROBIC AND ANAEROBIC Blood Culture adequate volume   Culture   Final    NO GROWTH < 24 HOURS Performed at [facility], [address], [place], [place] [address]    Report Status PENDING  Incomplete  Culture, blood (routine x 2)     Status: None (Preliminary result)   Collection Time: 12/11/19  3:37 PM   Specimen: BLOOD  Result Value Ref Range Status   Specimen Description BLOOD BLOOD RIGHT HAND  Final   Special Requests   Final    BOTTLES DRAWN AEROBIC AND ANAEROBIC Blood Culture adequate volume   Culture   Final    NO GROWTH < 24 HOURS Performed at [facility], [address], [place], [place] [address]    Report Status PENDING  Incomplete  SARS CORONAVIRUS 2 (TAT 6-24 HRS) Nasopharyngeal Nasopharyngeal Swab     Status: None   Collection Time: 12/11/19  5:12 PM   Specimen: Nasopharyngeal Swab  Result Value Ref Range Status   SARS Coronavirus 2 NEGATIVE NEGATIVE Final    Comment: (NOTE) SARS-CoV-2 target nucleic acids are  NOT DETECTED. The SARS-CoV-2 RNA is generally detectable in upper and lower respiratory specimens during the acute phase of infection. Negative results do not preclude SARS-CoV-2 infection, do not rule out co-infections with other pathogens, and should not be used as the sole basis for treatment or other patient management decisions. Negative results must be combined with clinical observations, patient history, and epidemiological information. The expected result is Negative. Fact Sheet for Patients: SugarRoll.be Fact Sheet for Healthcare Providers: https://www.woods-mathews.com/ This test is not yet approved or cleared by the [place] FDA and  has been authorized for detection and/or diagnosis of SARS-CoV-2 by FDA under an Emergency Use Authorization (EUA). This EUA will remain  in effect (meaning this test can be used) for the duration of the COVID-19 declaration under Section 56 4(b)(1) of the Act, 21 U.S.C. section 360bbb-3(b)(1), unless the authorization is terminated or revoked sooner. Performed at [facility], [place] [address], [place], [place] [address]     Procedures and diagnostic studies:  [place] Transrectal Complete  Result Date: 12/11/2019 Please see Notes tab for imaging impression.  DG Chest Portable 1 View  Result Date: 12/11/2019 CLINICAL DATA:  Chills EXAM: PORTABLE CHEST 1 VIEW COMPARISON:  05/18/2019 FINDINGS: Right basilar scarring. Blunting of the left costophrenic angle likely reflects chronic pleural thickening. No new consolidation  or edema. Stable cardiomediastinal contours. Left chest wall dual lead pacemaker is present. IMPRESSION: No acute process in the chest. Electronically Signed   By: Macy Mis M.D.   On: 12/11/2019 14:16   [place] PROSTATE BIOPSY MULTIPLE  Result Date: 12/11/2019 Please see Notes tab for imaging impression.   Medications:   . apixaban  5 mg Oral BID  . atorvastatin  80 mg  Oral q1800  . carvedilol  3.125 mg Oral BID WC  . ranolazine  500 mg Oral BID  . sodium chloride flush  3 mL Intravenous Q12H  . venlafaxine XR  75 mg Oral Q breakfast   Continuous Infusions:   LOS: 0 days   Matti Minney  [facility]   *Please refer to [email or web address], password [email or web address] to get updated schedule on who will round on this patient, as hospitalists switch teams weekly. If 7PM-7AM, please contact night-coverage at [email or web address], password [email or web address] for any overnight needs.  12/12/2019, 3:44 PM

## 2019-12-12 NOTE — Plan of Care (Signed)

## 2019-12-13 ENCOUNTER — Encounter: Payer: Self-pay | Admitting: Internal Medicine

## 2019-12-13 DIAGNOSIS — R2681 Unsteadiness on feet: Secondary | ICD-10-CM | POA: Diagnosis not present

## 2019-12-13 DIAGNOSIS — R531 Weakness: Secondary | ICD-10-CM | POA: Diagnosis not present

## 2019-12-13 DIAGNOSIS — I130 Hypertensive heart and chronic kidney disease with heart failure and stage 1 through stage 4 chronic kidney disease, or unspecified chronic kidney disease: Secondary | ICD-10-CM | POA: Diagnosis present

## 2019-12-13 DIAGNOSIS — I5042 Chronic combined systolic (congestive) and diastolic (congestive) heart failure: Secondary | ICD-10-CM | POA: Diagnosis present

## 2019-12-13 DIAGNOSIS — Z87891 Personal history of nicotine dependence: Secondary | ICD-10-CM | POA: Diagnosis not present

## 2019-12-13 DIAGNOSIS — N179 Acute kidney failure, unspecified: Secondary | ICD-10-CM | POA: Diagnosis present

## 2019-12-13 DIAGNOSIS — I252 Old myocardial infarction: Secondary | ICD-10-CM | POA: Diagnosis not present

## 2019-12-13 DIAGNOSIS — I350 Nonrheumatic aortic (valve) stenosis: Secondary | ICD-10-CM | POA: Diagnosis present

## 2019-12-13 DIAGNOSIS — Z9181 History of falling: Secondary | ICD-10-CM | POA: Diagnosis not present

## 2019-12-13 DIAGNOSIS — Z7901 Long term (current) use of anticoagulants: Secondary | ICD-10-CM | POA: Diagnosis not present

## 2019-12-13 DIAGNOSIS — Z66 Do not resuscitate: Secondary | ICD-10-CM | POA: Diagnosis present

## 2019-12-13 DIAGNOSIS — D696 Thrombocytopenia, unspecified: Secondary | ICD-10-CM | POA: Diagnosis present

## 2019-12-13 DIAGNOSIS — I2510 Atherosclerotic heart disease of native coronary artery without angina pectoris: Secondary | ICD-10-CM | POA: Diagnosis present

## 2019-12-13 DIAGNOSIS — C61 Malignant neoplasm of prostate: Secondary | ICD-10-CM | POA: Diagnosis present

## 2019-12-13 DIAGNOSIS — E785 Hyperlipidemia, unspecified: Secondary | ICD-10-CM | POA: Diagnosis present

## 2019-12-13 DIAGNOSIS — Z85828 Personal history of other malignant neoplasm of skin: Secondary | ICD-10-CM | POA: Diagnosis not present

## 2019-12-13 DIAGNOSIS — Z20828 Contact with and (suspected) exposure to other viral communicable diseases: Secondary | ICD-10-CM | POA: Diagnosis present

## 2019-12-13 DIAGNOSIS — R6883 Chills (without fever): Secondary | ICD-10-CM | POA: Diagnosis present

## 2019-12-13 DIAGNOSIS — N1831 Chronic kidney disease, stage 3a: Secondary | ICD-10-CM | POA: Diagnosis present

## 2019-12-13 DIAGNOSIS — M5136 Other intervertebral disc degeneration, lumbar region: Secondary | ICD-10-CM | POA: Diagnosis present

## 2019-12-13 DIAGNOSIS — Z79899 Other long term (current) drug therapy: Secondary | ICD-10-CM | POA: Diagnosis not present

## 2019-12-13 DIAGNOSIS — R739 Hyperglycemia, unspecified: Secondary | ICD-10-CM | POA: Diagnosis not present

## 2019-12-13 DIAGNOSIS — F329 Major depressive disorder, single episode, unspecified: Secondary | ICD-10-CM | POA: Diagnosis present

## 2019-12-13 DIAGNOSIS — Z7951 Long term (current) use of inhaled steroids: Secondary | ICD-10-CM | POA: Diagnosis not present

## 2019-12-13 LAB — CBC
HCT: 32.8 % — ABNORMAL LOW (ref 39.0–52.0)
Hemoglobin: 11.7 g/dL — ABNORMAL LOW (ref 13.0–17.0)
MCH: 30.7 pg (ref 26.0–34.0)
MCHC: 35.7 g/dL (ref 30.0–36.0)
MCV: 86.1 fL (ref 80.0–100.0)
Platelets: 93 10*3/uL — ABNORMAL LOW (ref 150–400)
RBC: 3.81 MIL/uL — ABNORMAL LOW (ref 4.22–5.81)
RDW: 13.2 % (ref 11.5–15.5)
WBC: 6.2 10*3/uL (ref 4.0–10.5)
nRBC: 0 % (ref 0.0–0.2)

## 2019-12-13 LAB — BASIC METABOLIC PANEL
Anion gap: 9 (ref 5–15)
BUN: 26 mg/dL — ABNORMAL HIGH (ref 8–23)
CO2: 23 mmol/L (ref 22–32)
Calcium: 8.8 mg/dL — ABNORMAL LOW (ref 8.9–10.3)
Chloride: 105 mmol/L (ref 98–111)
Creatinine, Ser: 1.27 mg/dL — ABNORMAL HIGH (ref 0.61–1.24)
GFR calc Af Amer: 59 mL/min — ABNORMAL LOW (ref >60)
GFR calc non Af Amer: 51 mL/min — ABNORMAL LOW (ref >60)
Glucose, Bld: 105 mg/dL — ABNORMAL HIGH (ref 70–99)
Potassium: 4.2 mmol/L (ref 3.5–5.1)
Sodium: 137 mmol/L (ref 135–145)

## 2019-12-13 LAB — SURGICAL PATHOLOGY

## 2019-12-13 LAB — URINE CULTURE: Culture: NO GROWTH

## 2019-12-13 NOTE — TOC Progression Note (Signed)
Transition of Care [facility] - Progression Note    Patient Details  Name: Kristopher Fernandez MRN: [id number] Date of Birth: [birth year]  [facility] CM/SW Contact  Shelbie Hutching, RN Phone Number: 12/13/2019, 11:46 AM  Clinical Narrative:     Patient was still very weak working with PT today and they continue to recommend SNF.  RNCM has started working patient up for SNF and started bed search.  Patient verbalizes that he does not want to go to short term rehab he wants to go home.  Hopefully over the next few days while SNF workup is in progress patient will improve to where he can go home with home health.  [facility] already accepted referral for home health services.   Expected Discharge Plan: [place] Barriers to Discharge: Continued Medical Work up  Expected Discharge Plan and Services Expected Discharge Plan: [place]   Discharge Planning Services: CM Consult Post Acute Care Choice: [place] arrangements for the past 2 months: Single Family Home Expected Discharge Date: 12/12/19                         HH Arranged: PT, OT HH Agency: [facility] Date [place]: 12/12/19 Time [place]: [place] Representative spoke with at [place]: [place] ([place]) Interventions    Readmission Risk Interventions Readmission Risk Prevention Plan 05/16/2019  Transportation Screening Complete  PCP or Specialist Appt within 5-7 Days Complete  Home Care Screening Complete  Medication Review (RN CM) Complete  Some recent data might be hidden

## 2019-12-13 NOTE — Progress Notes (Addendum)
Progress Note    Kristopher Fernandez  [id number] DOB: 11/18/1932  DOA: 12/11/2019 PCP: Tracie Harrier, MD       Assessment/Plan:   Active Problems:   Chills without fever   Rectal pain   Thrombocytopenia (HCC)   Generalized weakness   Body mass index is 24.14 kg/m.   Chills s/p transrectal ultrasound and biopsy of the prostate on 12/11/2019: No growth on urine culture.  No growth on blood culture thus far. No antibiotics for now.  Generalized weakness: PT recommends SNF at discharge because he will be unsafe at home.  Patient is reluctant to go to SNF.  He will be reevaluated by PT tomorrow.  Consulted OT.  Patient is unsafe for discharge to home for now.  Chronic thrombocytopenia: Stable.   CKD stage IIIa: Creatinine is stable.  Hypertension: Continue antihypertensive  Chronic systolic and diastolic CHF: Compensated.  CAD and history of stroke: Continue Eliquis and Lipitor.  He is also on Ranexa for CAD.  Abnormal urinalysis: This is probably due to recent prostate biopsy.  No growth on urine culture.  No antibiotics for now.   Family Communication/Anticipated D/C date and plan/Code Status   DVT prophylaxis: Eliquis Code Status: Full code Family Communication: Plan discussed with patient Disposition Plan: Possible discharge to home versus SNF tomorrow     Subjective:   He complains of dizziness after working with physical therapist.  He did not do very well with physical therapist.  He was shaky and unsteady on his feet while holding onto the walker with PT.  I was at the bedside during evaluation by PT.  Objective:    Vitals:   12/12/19 2010 12/13/19 0424 12/13/19 0917 12/13/19 0935  BP: (!) 107/56 (!) 105/51 (!) 153/134 (!) 119/96  Pulse: 71 (!) 58 69 73  Resp: 16 16 17    Temp: 98.8 F (37.1 C) 98 F (36.7 C) (!) 97.4 F (36.3 C)   TempSrc: Oral Oral    SpO2: 96% 95% 97%   Weight:      Height:        Intake/Output Summary (Last 24 hours)  at 12/13/2019 1406 Last data filed at 12/13/2019 1300 Gross per 24 hour  Intake 840 ml  Output 525 ml  Net 315 ml   Filed Weights   12/11/19 1342  Weight: 80.7 kg    Exam:   GEN: NAD SKIN: No rash EYES: EOMI, PERRLA, no nystagmus ENT: MMM CV: RRR PULM: CTA B ABD: soft, ND, NT, +BS CNS: AAO x 3, non focal EXT: No edema or tenderness   Data Reviewed:   I have personally reviewed following labs and imaging studies:  Labs: Labs show the following:   Basic Metabolic Panel: Recent Labs  Lab 12/11/19 1338 12/12/19 0814 12/13/19 0438  NA 134* 135 137  K 4.0 4.1 4.2  CL 103 102 105  CO2 20* 25 23  GLUCOSE 159* 113* 105*  BUN 28* 27* 26*  CREATININE 1.36* 1.49* 1.27*  CALCIUM 8.7* 8.7* 8.8*   GFR Estimated Creatinine Clearance: 46.7 mL/min (A) (by C-G formula based on SCr of 1.27 mg/dL (H)). Liver Function Tests: Recent Labs  Lab 12/11/19 1338  AST 24  ALT 14  ALKPHOS 88  BILITOT 0.9  PROT 6.4*  ALBUMIN 3.2*   No results for input(s): LIPASE, AMYLASE in the last 168 hours. No results for input(s): AMMONIA in the last 168 hours. Coagulation profile No results for input(s): INR, PROTIME in the last  168 hours.  CBC: Recent Labs  Lab 12/11/19 1338 12/12/19 0814 12/13/19 0438  WBC 7.2 6.4 6.2  NEUTROABS 6.7  --   --   HGB 12.5* 11.2* 11.7*  HCT 36.6* 32.5* 32.8*  MCV 90.1 86.9 86.1  PLT 93* 81* 93*   Cardiac Enzymes: No results for input(s): CKTOTAL, CKMB, CKMBINDEX, TROPONINI in the last 168 hours. BNP (last 3 results) No results for input(s): PROBNP in the last 8760 hours. CBG: No results for input(s): GLUCAP in the last 168 hours. D-Dimer: No results for input(s): DDIMER in the last 72 hours. Hgb A1c: No results for input(s): HGBA1C in the last 72 hours. Lipid Profile: No results for input(s): CHOL, HDL, LDLCALC, TRIG, CHOLHDL, LDLDIRECT in the last 72 hours. Thyroid function studies: No results for input(s): TSH, T4TOTAL, T3FREE,  THYROIDAB in the last 72 hours.  Invalid input(s): FREET3 Anemia work up: No results for input(s): VITAMINB12, FOLATE, FERRITIN, TIBC, IRON, RETICCTPCT in the last 72 hours. Sepsis Labs: Recent Labs  Lab 12/11/19 1338 12/11/19 1533 12/11/19 1711 12/12/19 0814 12/13/19 0438  WBC 7.2  --   --  6.4 6.2  LATICACIDVEN  --  1.7 1.5  --   --     Microbiology Recent Results (from the past 240 hour(s))  SARS CORONAVIRUS 2 (TAT 6-24 HRS) Nasopharyngeal Nasopharyngeal Swab     Status: None   Collection Time: 12/06/19 12:56 PM   Specimen: Nasopharyngeal Swab  Result Value Ref Range Status   SARS Coronavirus 2 NEGATIVE NEGATIVE Final    Comment: (NOTE) SARS-CoV-2 target nucleic acids are NOT DETECTED. The SARS-CoV-2 RNA is generally detectable in upper and lower respiratory specimens during the acute phase of infection. Negative results do not preclude SARS-CoV-2 infection, do not rule out co-infections with other pathogens, and should not be used as the sole basis for treatment or other patient management decisions. Negative results must be combined with clinical observations, patient history, and epidemiological information. The expected result is Negative. Fact Sheet for Patients: SugarRoll.be Fact Sheet for Healthcare Providers: https://www.woods-mathews.com/ This test is not yet approved or cleared by the [place] FDA and  has been authorized for detection and/or diagnosis of SARS-CoV-2 by FDA under an Emergency Use Authorization (EUA). This EUA will remain  in effect (meaning this test can be used) for the duration of the COVID-19 declaration under Section 56 4(b)(1) of the Act, 21 U.S.C. section 360bbb-3(b)(1), unless the authorization is terminated or revoked sooner. Performed at [facility], [place] [address] [place], [place] [address]   Culture, blood (routine x 2)     Status: None (Preliminary result)   Collection Time:  12/11/19  3:32 PM   Specimen: BLOOD  Result Value Ref Range Status   Specimen Description BLOOD BLOOD LEFT HAND  Final   Special Requests   Final    BOTTLES DRAWN AEROBIC AND ANAEROBIC Blood Culture adequate volume   Culture   Final    NO GROWTH 2 DAYS Performed at [facility], [address] [place], [place] [address]    Report Status PENDING  Incomplete  Culture, blood (routine x 2)     Status: None (Preliminary result)   Collection Time: 12/11/19  3:37 PM   Specimen: BLOOD  Result Value Ref Range Status   Specimen Description BLOOD BLOOD RIGHT HAND  Final   Special Requests   Final    BOTTLES DRAWN AEROBIC AND ANAEROBIC Blood Culture adequate volume   Culture   Final  NO GROWTH 2 DAYS Performed at [facility], [address] [place], [place] [address]    Report Status PENDING  Incomplete  Urine Culture     Status: None   Collection Time: 12/11/19  3:49 PM   Specimen: Urine, Random  Result Value Ref Range Status   Specimen Description   Final    URINE, RANDOM Performed at [facility], [address] [place], [place] [address]    Special Requests   Final    NONE Performed at [facility], [address] [place] [address] [place], [place] [address]    Culture   Final    NO GROWTH Performed at [facility], [place] [address], [place], [place] [address]    Report Status 12/13/2019 FINAL  Final  SARS CORONAVIRUS 2 (TAT 6-24 HRS) Nasopharyngeal Nasopharyngeal Swab     Status: None   Collection Time: 12/11/19  5:12 PM   Specimen: Nasopharyngeal Swab  Result Value Ref Range Status   SARS Coronavirus 2 NEGATIVE NEGATIVE Final    Comment: (NOTE) SARS-CoV-2 target nucleic acids are NOT DETECTED. The SARS-CoV-2 RNA is generally detectable in upper and lower respiratory specimens during the acute phase of infection. Negative results do not preclude SARS-CoV-2 infection, do not rule out co-infections with other pathogens, and should not be used  as the sole basis for treatment or other patient management decisions. Negative results must be combined with clinical observations, patient history, and epidemiological information. The expected result is Negative. Fact Sheet for Patients: SugarRoll.be Fact Sheet for Healthcare Providers: https://www.woods-mathews.com/ This test is not yet approved or cleared by the [place] FDA and  has been authorized for detection and/or diagnosis of SARS-CoV-2 by FDA under an Emergency Use Authorization (EUA). This EUA will remain  in effect (meaning this test can be used) for the duration of the COVID-19 declaration under Section 56 4(b)(1) of the Act, 21 U.S.C. section 360bbb-3(b)(1), unless the authorization is terminated or revoked sooner. Performed at [facility], [place] [address] [place], [place] [address]     Procedures and diagnostic studies:  DG Chest Portable 1 View  Result Date: 12/11/2019 CLINICAL DATA:  Chills EXAM: PORTABLE CHEST 1 VIEW COMPARISON:  05/18/2019 FINDINGS: Right basilar scarring. Blunting of the left costophrenic angle likely reflects chronic pleural thickening. No new consolidation or edema. Stable cardiomediastinal contours. Left chest wall dual lead pacemaker is present. IMPRESSION: No acute process in the chest. Electronically Signed   By: Macy Mis M.D.   On: 12/11/2019 14:16    Medications:   . apixaban  5 mg Oral BID  . atorvastatin  80 mg Oral q1800  . carvedilol  3.125 mg Oral BID WC  . ranolazine  500 mg Oral BID  . sodium chloride flush  3 mL Intravenous Q12H  . venlafaxine XR  75 mg Oral Q breakfast   Continuous Infusions:   LOS: 0 days   Alvenia Treese  [facility]   *Please refer to [email or web address], password [email or web address] to get updated schedule on who will round on this patient, as hospitalists switch teams weekly. If 7PM-7AM, please contact night-coverage at [email or web address], password [email or web address] for  any overnight needs.  12/13/2019, 2:06 PM

## 2019-12-13 NOTE — Evaluation (Signed)
Occupational Therapy Evaluation Patient Details Name: Kristopher Fernandez MRN: [id number] DOB: 01/12/1932 Today's Date: 12/13/2019    History of Present Illness 85 yo male who presented to ED with subjective fever after a prostate biopsy and c/o rectum pain. PMH includes HLD, HTN, depression, pacemaker, CAD s/p stents, chronic combined CHF   Clinical Impression   Pt seen for OT evaluation this date. 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  Pt lives alone in [facility] with 2 STE with R sided railing with intermittent use of SPC for longer distances. Reports still driving.  Currently pt demonstrates impairments in sitting balance and tolerance as well as gross weakness of trunk and limbs, decreased general fxl activity tolerance-unable to assess stand on OT eval d/t pt report of fatigue-  requiring MIN A with bed mobility, MOD/MAX with LB dressing using lateral lean method, MIN A with UB ADLs. Pt would benefit from skilled OT to address noted impairments and functional limitations (see below for any additional details) in order to maximize safety and independence while minimizing falls risk and caregiver burden.  Upon hospital discharge, recommend pt discharge to SNF.    Follow Up Recommendations  SNF    Equipment Recommendations  Other (comment)(defer to next venue of care.)    Recommendations for Other Services       Precautions / Restrictions Precautions Precautions: Fall Restrictions Weight Bearing Restrictions: No      Mobility Bed Mobility Overal bed mobility: Needs Assistance Bed Mobility: Supine to Sit;Sit to Supine     Supine to sit: Min assist Sit to supine: Mod assist   General bed mobility comments: Pt reports some dizziness on initial sit. OT encourages ankle pumps in static sitting which pt reports does give him some relief.  Transfers         General transfer comment: pt declines to t/f, cites fatigue.  MOD A per PT note.    Balance Overall balance assessment: Needs assistance Sitting-balance support: Feet supported;Single extremity supported Sitting balance-Leahy Scale: Fair Sitting balance - Comments: use of UEs to sustain static EOB sitting balance.   Standing balance comment: deferred                           ADL either performed or assessed with clinical judgement   ADL Overall ADL's : Needs assistance/impaired Eating/Feeding: Minimal assistance;Sitting Eating/Feeding Details (indicate cue type and reason): requires assist for fine motor-tearing pacakages, opening milk cartons. Requires use of both hands for hand to mouth with a beverage cup Grooming: Wash/dry face;Set up;Sitting   Upper Body Bathing: Minimal assistance;Sitting   Lower Body Bathing: Moderate assistance;Maximal assistance;Sitting/lateral leans Lower Body Bathing Details (indicate cue type and reason): pt unable to tolerate attempting stand at this time citing fatigue as he saw PT just prior to OT evaluation Upper Body Dressing : Minimal assistance;Sitting   Lower Body Dressing: Moderate assistance;Maximal assistance;Sitting/lateral leans     Toilet Transfer Details (indicate cue type and reason): unable to assess ADL transfer at this time, pt citing fatigue.                 Vision Patient Visual Report: No change from baseline       Perception     Praxis      Pertinent Vitals/Pain Pain Assessment: No/denies pain     Hand Dominance Right   Extremity/Trunk Assessment Upper Extremity Assessment Upper Extremity Assessment:  Generalized weakness;RUE deficits/detail;LUE deficits/detail RUE Deficits / Details: shld completes 1/3 arc of motion flexion-MMT 2/5, elbow 3/5, grip 3/5 LUE Deficits / Details: shld completes 1/2 arc of motion flexion-MMT 2+/5, elbow 3/5, grip 3/5   Lower Extremity Assessment Lower Extremity Assessment: Defer to PT evaluation;Generalized weakness        Communication Communication Communication: No difficulties   Cognition Arousal/Alertness: Awake/alert Behavior During Therapy: WFL for tasks assessed/performed Overall Cognitive Status: Within Functional Limits for tasks assessed                                     General Comments       Exercises Other Exercises Other Exercises: OT facilitates education re: role of OT. Pt verbalized understanding. Other Exercises: OT facilitates education re: importance of OOB activity including prevention of opportunistic infection such as PNA. Pt verbalized understanding.   Shoulder Instructions      Home Living Family/patient expects to be discharged to:: Private residence Living Arrangements: Alone Available Help at Discharge: Family;Other (Comment);Available PRN/intermittently(pt states dtr lives nearby and can stay for a period of time after hospital if needed. Also states he has firends that can help PRN.) Type of Home: House Home Access: Stairs to enter [employer] of Steps: 2 Entrance Stairs-Rails: Right Home Layout: One level     Bathroom Shower/Tub: Occupational psychologist: Handicapped height     Home Equipment: Grab bars - tub/shower;Grab bars - toilet;Cane - single point;Shower seat   Additional Comments: Pt states he rarely used SPC-primarily for steps and community/longer distance. 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      Prior Functioning/Environment Level of Independence: Independent with assistive device(s)                 OT Problem List: Decreased strength;Decreased range of motion;Decreased activity tolerance;Impaired balance (sitting and/or standing);Decreased knowledge of use of DME or AE;Impaired UE functional use      OT Treatment/Interventions: Self-care/ADL training;Therapeutic exercise;Energy conservation;DME and/or AE instruction;Therapeutic  activities;Patient/family education;Balance training    OT Goals(Current goals can be found in the care plan section) Acute Rehab OT Goals Patient Stated Goal: to get my strength back OT Goal Formulation: With patient Time For Goal Achievement: 12/27/19 Potential to Achieve Goals: Good  OT Frequency: Min 1X/week   Barriers to D/C: Decreased caregiver support          Co-evaluation              AM-PAC OT "6 Clicks" Daily Activity     Outcome Measure Help from another person eating meals?: A Little Help from another person taking care of personal grooming?: A Little Help from another person toileting, which includes using toliet, bedpan, or urinal?: A Lot Help from another person bathing (including washing, rinsing, drying)?: A Lot Help from another person to put on and taking off regular upper body clothing?: A Little Help from another person to put on and taking off regular lower body clothing?: A Lot 6 Click Score: 15   End of Session    Activity Tolerance: Patient tolerated treatment well;Patient limited by fatigue Patient left: in bed;with call bell/phone within reach;with bed alarm set  OT Visit Diagnosis: Unsteadiness on feet (R26.81);Muscle weakness (generalized) (M62.81)                Time: [id number] OT Time  Calculation (min): 44 min Charges:  OT General Charges $OT Visit: 1 Visit OT Evaluation $OT Eval Moderate Complexity: 1 Mod OT Treatments $Self Care/Home Management : 8-22 mins $Therapeutic Activity: 8-22 mins  Gerrianne Scale, MS, OTR/L ascom [phone] 12/13/19, 11:45 AM

## 2019-12-13 NOTE — Progress Notes (Signed)
Physical Therapy Treatment Patient Details Name: Kristopher Fernandez MRN: [id number] DOB: 18-Jan-1932 Today's Date: 12/13/2019    History of Present Illness 85 yo male who presented to ED with subjective fever after a prostate biopsy and c/o rectum pain. PMH includes HLD, HTN, depression, pacemaker, CAD s/p stents, chronic combined CHF    PT Comments    Saw pt this morning expecting him to be much more mobile and hoping to mobilize/ambulate well enough to be able to go home however he continued to be very weak and functionally limited.  He was unable to get to standing w/o mod assist and was extremely unsteady and unsafe with standing tasks.  Unable to attempt ambulation and even with heavy assist and just a few shuffling side steps along EOB he was unable to tolerate much at all.  Pt completely unsafe to go home at this time and though he was hoping to do better acknowledges that he is too weak and unsteady to be able to manage at home.  Pt will need STR at discharged.     Follow Up Recommendations  SNF     Equipment Recommendations  Rolling walker with 5" wheels    Recommendations for Other Services       Precautions / Restrictions Precautions Precautions: Fall Restrictions Weight Bearing Restrictions: No    Mobility  Bed Mobility Overal bed mobility: Needs Assistance Bed Mobility: Supine to Sit;Sit to Supine     Supine to sit: Min assist Sit to supine: Mod assist   General bed mobility comments: Pt with some dizziness during transition, needed phyiscal assist.  Difficult to assess for actual nystagmus, etc as pt was unable to keep eyes open and was generally feeling poorly.  Did not describe true room-spinning vertigo but was light headed and generally off  Transfers Overall transfer level: Needs assistance Equipment used: Rolling walker (2 wheeled) Transfers: Sit to/from Stand Sit to Stand: Mod assist         General transfer comment: Attempted to stand w/o assist and  unable to attain upright, mod assist to stand on next attempt with very poor confidence/balance and needing constant assist just to keep from falling forward or to the side   Ambulation/Gait             General Gait Details: unable to actually ambulate, manage a few assisted and very unsteady side shuffles along EOB but requiring constant assist to maintain upright and ultimately unsafe and very limited.   Stairs             Wheelchair Mobility    Modified Rankin (Stroke Patients Only)       Balance Overall balance assessment: Needs assistance Sitting-balance support: Feet supported;Single extremity supported Sitting balance-Leahy Scale: Fair     Standing balance support: Bilateral upper extremity supported Standing balance-Leahy Scale: Poor Standing balance comment: Pt struggled the entire time in standing, leaning forward and needing constant assist to keep from falling.  Pt very unsafe and unsteady in standing                            Cognition Arousal/Alertness: Awake/alert Behavior During Therapy: WFL for tasks assessed/performed Overall Cognitive Status: Within Functional Limits for tasks assessed                                        Exercises  General Comments        Pertinent Vitals/Pain Pain Assessment: No/denies pain    Home Living                      Prior Function            PT Goals (current goals can now be found in the care plan section) Progress towards PT goals: Progressing toward goals(slow progress, still very functionally limited)    Frequency    Min 2X/week      PT Plan Current plan remains appropriate    Co-evaluation              AM-PAC PT "6 Clicks" Mobility   Outcome Measure  Help needed turning from your back to your side while in a flat bed without using bedrails?: A Little Help needed moving from lying on your back to sitting on the side of a flat bed without  using bedrails?: A Little Help needed moving to and from a bed to a chair (including a wheelchair)?: A Lot Help needed standing up from a chair using your arms (e.g., wheelchair or bedside chair)?: A Lot Help needed to walk in hospital room?: A Lot Help needed climbing 3-5 steps with a railing? : Total 6 Click Score: 13    End of Session Equipment Utilized During Treatment: Gait belt Activity Tolerance: Patient limited by fatigue;Patient limited by lethargy;Other (comment);Patient limited by pain(pt continues to dizziness with activity, very weak ) Patient left: in bed;with call bell/phone within reach;with bed alarm set Nurse Communication: Mobility status PT Visit Diagnosis: Muscle weakness (generalized) (M62.81);Difficulty in walking, not elsewhere classified (R26.2);Other abnormalities of gait and mobility (R26.89)     Time: [id number] PT Time Calculation (min) (ACUTE ONLY): 27 min  Charges:  $Therapeutic Activity: 23-37 mins                     Kreg Shropshire, DPT 12/13/2019, 10:34 AM

## 2019-12-13 NOTE — NC FL2 (Signed)
[place] LEVEL OF CARE SCREENING TOOL     IDENTIFICATION  Patient Name: Kristopher Fernandez Birthdate: [birth year] Sex: male Admission Date (Current Location): 12/11/2019  [place] and [place] Number:  Engineering geologist and Address:  [facility], [address] [place], [place] [address]      Provider Number: [id number]  Attending Physician Name and Address:  Jennye Boroughs, MD  Relative Name and Phone Number:  Lilla Shook [phone]    Current Level of Care: Hospital Recommended Level of Care: [place] Prior Approval Number:    Date Approved/Denied:   PASRR Number: Pending  Discharge Plan: SNF    Current Diagnoses: Patient Active Problem List   Diagnosis Date Noted  . Chills without fever 12/11/2019  . Rectal pain 12/11/2019  . Thrombocytopenia ([place]) 12/11/2019  . Cerebral embolism with cerebral infarction 05/20/2019  . CHF (congestive heart failure) ([place]) 05/17/2019  . Palliative care by specialist   . Goals of care, counseling/discussion   . Acute on chronic systolic congestive heart failure ([place])   . Grief reaction 05/15/2019  . Chest pain 12/26/2018  . Ascending aortic aneurysm ([place]) 02/09/2018  . CKD (chronic kidney disease) stage 3, GFR 30-59 ml/min 02/03/2017  . Status post cardiac catheterization 07/14/2016  . Shortness of breath 05/13/2016  . 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 Normocytic normochromic anemia 09/14/2015  . Lumbosacral radiculopathy at S1 08/22/2015  . DDD (degenerative disc disease), lumbar 08/06/2015  . Basal cell carcinoma, ear 03/31/2015  . History of malignant neoplasm of skin 11/18/2014  . Aortic stenosis 07/03/2014  . Aortic valve stenosis 07/03/2014  . Bradycardia 07/03/2014  . H/O cardiac catheterization 07/03/2014  . HTN (hypertension) 07/03/2014  . Hyperlipidemia 07/03/2014  . Hypertension 07/03/2014  . MI (myocardial  infarction) ([place]) 07/03/2014  . Pacemaker 07/03/2014  . Shoulder pain 11/20/2013  . Pain in joint involving pelvic region and thigh 11/27/2012  . Trochanteric bursitis 11/27/2012    Orientation RESPIRATION BLADDER Height & Weight     Self, Time, Situation, Place  Normal Continent Weight: 80.7 kg Height:  6' (182.9 cm)  BEHAVIORAL SYMPTOMS/MOOD NEUROLOGICAL BOWEL NUTRITION STATUS      Continent Diet(2 gram sodium)  AMBULATORY STATUS COMMUNICATION OF NEEDS Skin   Extensive Assist Verbally Normal                       Personal Care Assistance Level of Assistance  Bathing, Feeding, Dressing Bathing Assistance: Limited assistance Feeding assistance: Independent Dressing Assistance: Limited assistance     Functional Limitations Info             SPECIAL CARE FACTORS FREQUENCY  PT (By licensed PT), OT (By licensed OT)     PT Frequency: 5 times per week OT Frequency: 5 times per week            Contractures Contractures Info: Not present    Additional Factors Info  Code Status, Allergies Code Status Info: Full Allergies Info: NKA           Current Medications (12/13/2019):  This is the current hospital active medication list Current Facility-Administered Medications  Medication Dose Route Frequency Provider Last Rate Last Admin  . acetaminophen (TYLENOL) tablet 650 mg  650 mg Oral Q6H PRN Wyvonnia Dusky, MD   650 mg at 12/12/19 1029   Or  . acetaminophen (TYLENOL) suppository 650 mg  650 mg Rectal Q6H PRN Wyvonnia Dusky,  MD      . apixaban (ELIQUIS) tablet 5 mg  5 mg Oral BID Wyvonnia Dusky, MD   5 mg at 12/13/19 0932  . atorvastatin (LIPITOR) tablet 80 mg  80 mg Oral q1800 Wyvonnia Dusky, MD   80 mg at 12/12/19 1720  . carvedilol (COREG) tablet 3.125 mg  3.125 mg Oral BID WC Wyvonnia Dusky, MD   3.125 mg at 12/13/19 0932  . ipratropium-albuterol (DUONEB) 0.5-2.5 (3) MG/3ML nebulizer solution 3 mL  3 mL Nebulization Q6H PRN Wyvonnia Dusky, MD      . morphine 2 MG/ML injection 2 mg  2 mg Intravenous Q3H PRN Benita Gutter, RPH      . ranolazine (RANEXA) 12 hr tablet 500 mg  500 mg Oral BID Wyvonnia Dusky, MD   500 mg at 12/13/19 0932  . senna-docusate (Senokot-S) tablet 1 tablet  1 tablet Oral QHS PRN Wyvonnia Dusky, MD      . sodium chloride flush (NS) 0.9 % injection 3 mL  3 mL Intravenous Q12H Wyvonnia Dusky, MD   3 mL at 12/13/19 0932  . traMADol (ULTRAM) tablet 50 mg  50 mg Oral Q6H PRN Wyvonnia Dusky, MD      . venlafaxine XR [facility]) 24 hr capsule 75 mg  75 mg Oral Q breakfast Wyvonnia Dusky, MD   75 mg at 12/13/19 [id number]     Discharge Medications: Please see discharge summary for a list of discharge medications.  Relevant Imaging Results:  Relevant Lab Results:   Additional Information SSN [id number]  Shelbie Hutching, RN

## 2019-12-13 NOTE — Care Management Obs Status (Signed)
[place] NOTIFICATION   Patient Details  Name: Kristopher Fernandez MRN: [id number] Date of Birth: 12-Nov-1932   Medicare Observation Status Notification Given:  Yes    Shelbie Hutching, RN 12/13/2019, 8:38 AM

## 2019-12-14 DIAGNOSIS — I5042 Chronic combined systolic (congestive) and diastolic (congestive) heart failure: Secondary | ICD-10-CM

## 2019-12-14 NOTE — Plan of Care (Signed)

## 2019-12-14 NOTE — Progress Notes (Addendum)
Progress Note    Kristopher Fernandez  [id number] DOB: 02/24/1932  DOA: 12/11/2019 PCP: Tracie Harrier, MD       Assessment/Plan:   Principal Problem:   Chills without fever Active Problems:   CHF (congestive heart failure) (HCC)   Rectal pain   Thrombocytopenia (HCC)   Generalized weakness   Body mass index is 24.14 kg/m.   Chills s/p transrectal ultrasound and biopsy of the prostate on 12/11/2019: No growth on urine culture.  No growth on blood culture thus far. No antibiotics for now.  Generalized weakness: PT recommends SNF at discharge because he will be unsafe at home.  Awaiting reevaluation by PT to see whether patient will be able to go home with home health therapy.  Chronic thrombocytopenia: Stable.   CKD stage IIIa: Creatinine is stable.  Hypertension: Continue antihypertensive  Chronic systolic and diastolic CHF: Compensated.  CAD and history of stroke: Continue Eliquis and Lipitor.  He is also on Ranexa for CAD.  Abnormal urinalysis: This is probably due to recent prostate biopsy.  No growth on urine culture.  No antibiotics for now.   Family Communication/Anticipated D/C date and plan/Code Status   DVT prophylaxis: Eliquis Code Status: Full code Family Communication: Plan discussed with patient Disposition Plan: Possible discharge to home versus SNF tomorrow     Subjective:   He feels better today and he thinks he is much stronger today.  No dizziness, headache, shortness of breath or chest pain.  Objective:    Vitals:   12/13/19 1644 12/13/19 1924 12/14/19 0405 12/14/19 0813  BP: 136/85 112/64 (!) 103/56 (!) 119/55  Pulse: 74 68 63 60  Resp: 17 17 19 18   Temp: 98 F (36.7 C) 98.5 F (36.9 C) 97.7 F (36.5 C) 98.5 F (36.9 C)  TempSrc: Oral Oral Oral Oral  SpO2: 97% 93% 96% 94%  Weight:      Height:        Intake/Output Summary (Last 24 hours) at 12/14/2019 1420 Last data filed at 12/14/2019 1300 Gross per 24 hour  Intake  720 ml  Output 750 ml  Net -30 ml   Filed Weights   12/11/19 1342  Weight: 80.7 kg    Exam:   GEN: NAD SKIN: No rash EYES: EOMI ENT: MMM CV: RRR PULM: CTA B ABD: soft, ND, NT, +BS CNS: AAO x 3, non focal EXT: No edema or tenderness    Data Reviewed:   I have personally reviewed following labs and imaging studies:  Labs: Labs show the following:   Basic Metabolic Panel: Recent Labs  Lab 12/11/19 1338 12/12/19 0814 12/13/19 0438  NA 134* 135 137  K 4.0 4.1 4.2  CL 103 102 105  CO2 20* 25 23  GLUCOSE 159* 113* 105*  BUN 28* 27* 26*  CREATININE 1.36* 1.49* 1.27*  CALCIUM 8.7* 8.7* 8.8*   GFR Estimated Creatinine Clearance: 46.7 mL/min (A) (by C-G formula based on SCr of 1.27 mg/dL (H)). Liver Function Tests: Recent Labs  Lab 12/11/19 1338  AST 24  ALT 14  ALKPHOS 88  BILITOT 0.9  PROT 6.4*  ALBUMIN 3.2*   No results for input(s): LIPASE, AMYLASE in the last 168 hours. No results for input(s): AMMONIA in the last 168 hours. Coagulation profile No results for input(s): INR, PROTIME in the last 168 hours.  CBC: Recent Labs  Lab 12/11/19 1338 12/12/19 0814 12/13/19 0438  WBC 7.2 6.4 6.2  NEUTROABS 6.7  --   --  HGB 12.5* 11.2* 11.7*  HCT 36.6* 32.5* 32.8*  MCV 90.1 86.9 86.1  PLT 93* 81* 93*   Cardiac Enzymes: No results for input(s): CKTOTAL, CKMB, CKMBINDEX, TROPONINI in the last 168 hours. BNP (last 3 results) No results for input(s): PROBNP in the last 8760 hours. CBG: No results for input(s): GLUCAP in the last 168 hours. D-Dimer: No results for input(s): DDIMER in the last 72 hours. Hgb A1c: No results for input(s): HGBA1C in the last 72 hours. Lipid Profile: No results for input(s): CHOL, HDL, LDLCALC, TRIG, CHOLHDL, LDLDIRECT in the last 72 hours. Thyroid function studies: No results for input(s): TSH, T4TOTAL, T3FREE, THYROIDAB in the last 72 hours.  Invalid input(s): FREET3 Anemia work up: No results for input(s):  VITAMINB12, FOLATE, FERRITIN, TIBC, IRON, RETICCTPCT in the last 72 hours. Sepsis Labs: Recent Labs  Lab 12/11/19 1338 12/11/19 1533 12/11/19 1711 12/12/19 0814 12/13/19 0438  WBC 7.2  --   --  6.4 6.2  LATICACIDVEN  --  1.7 1.5  --   --     Microbiology Recent Results (from the past 240 hour(s))  SARS CORONAVIRUS 2 (TAT 6-24 HRS) Nasopharyngeal Nasopharyngeal Swab     Status: None   Collection Time: 12/06/19 12:56 PM   Specimen: Nasopharyngeal Swab  Result Value Ref Range Status   SARS Coronavirus 2 NEGATIVE NEGATIVE Final    Comment: (NOTE) SARS-CoV-2 target nucleic acids are NOT DETECTED. The SARS-CoV-2 RNA is generally detectable in upper and lower respiratory specimens during the acute phase of infection. Negative results do not preclude SARS-CoV-2 infection, do not rule out co-infections with other pathogens, and should not be used as the sole basis for treatment or other patient management decisions. Negative results must be combined with clinical observations, patient history, and epidemiological information. The expected result is Negative. Fact Sheet for Patients: SugarRoll.be Fact Sheet for Healthcare Providers: https://www.woods-mathews.com/ This test is not yet approved or cleared by the [place] FDA and  has been authorized for detection and/or diagnosis of SARS-CoV-2 by FDA under an Emergency Use Authorization (EUA). This EUA will remain  in effect (meaning this test can be used) for the duration of the COVID-19 declaration under Section 56 4(b)(1) of the Act, 21 U.S.C. section 360bbb-3(b)(1), unless the authorization is terminated or revoked sooner. Performed at [facility], [place] [address], [place], [place] [address]   Culture, blood (routine x 2)     Status: None (Preliminary result)   Collection Time: 12/11/19  3:32 PM   Specimen: BLOOD  Result Value Ref Range Status   Specimen Description BLOOD  BLOOD LEFT HAND  Final   Special Requests   Final    BOTTLES DRAWN AEROBIC AND ANAEROBIC Blood Culture adequate volume   Culture   Final    NO GROWTH 3 DAYS Performed at [facility], [address] [place], [place] [address]    Report Status PENDING  Incomplete  Culture, blood (routine x 2)     Status: None (Preliminary result)   Collection Time: 12/11/19  3:37 PM   Specimen: BLOOD  Result Value Ref Range Status   Specimen Description BLOOD BLOOD RIGHT HAND  Final   Special Requests   Final    BOTTLES DRAWN AEROBIC AND ANAEROBIC Blood Culture adequate volume   Culture   Final    NO GROWTH 3 DAYS Performed at [facility], [address], [place], [place] [address]    Report Status PENDING  Incomplete  Urine Culture  Status: None   Collection Time: 12/11/19  3:49 PM   Specimen: Urine, Random  Result Value Ref Range Status   Specimen Description   Final    URINE, RANDOM Performed at [facility], [address] [place], [place] [address]    Special Requests   Final    NONE Performed at [facility], [address], [place], [place] [address]    Culture   Final    NO GROWTH Performed at [facility], [place] [address], [place], [place] [address]    Report Status 12/13/2019 FINAL  Final  SARS CORONAVIRUS 2 (TAT 6-24 HRS) Nasopharyngeal Nasopharyngeal Swab     Status: None   Collection Time: 12/11/19  5:12 PM   Specimen: Nasopharyngeal Swab  Result Value Ref Range Status   SARS Coronavirus 2 NEGATIVE NEGATIVE Final    Comment: (NOTE) SARS-CoV-2 target nucleic acids are NOT DETECTED. The SARS-CoV-2 RNA is generally detectable in upper and lower respiratory specimens during the acute phase of infection. Negative results do not preclude SARS-CoV-2 infection, do not rule out co-infections with other pathogens, and should not be used as the sole basis for treatment or other patient management decisions. Negative results must be  combined with clinical observations, patient history, and epidemiological information. The expected result is Negative. Fact Sheet for Patients: SugarRoll.be Fact Sheet for Healthcare Providers: https://www.woods-mathews.com/ This test is not yet approved or cleared by the [place] FDA and  has been authorized for detection and/or diagnosis of SARS-CoV-2 by FDA under an Emergency Use Authorization (EUA). This EUA will remain  in effect (meaning this test can be used) for the duration of the COVID-19 declaration under Section 56 4(b)(1) of the Act, 21 U.S.C. section 360bbb-3(b)(1), unless the authorization is terminated or revoked sooner. Performed at [facility], [place] [address] [place], [place] [address]     Procedures and diagnostic studies:  No results found.  Medications:   . apixaban  5 mg Oral BID  . atorvastatin  80 mg Oral q1800  . carvedilol  3.125 mg Oral BID WC  . ranolazine  500 mg Oral BID  . sodium chloride flush  3 mL Intravenous Q12H  . venlafaxine XR  75 mg Oral Q breakfast   Continuous Infusions:   LOS: 1 day   Lilyanah Celestin  [facility]   *Please refer to [email or web address], password [email or web address] to get updated schedule on who will round on this patient, as hospitalists switch teams weekly. If 7PM-7AM, please contact night-coverage at [email or web address], password [email or web address] for any overnight needs.  12/14/2019, 2:20 PM

## 2019-12-15 LAB — CBC
HCT: 37.6 % — ABNORMAL LOW (ref 39.0–52.0)
Hemoglobin: 12.7 g/dL — ABNORMAL LOW (ref 13.0–17.0)
MCH: 29.9 pg (ref 26.0–34.0)
MCHC: 33.8 g/dL (ref 30.0–36.0)
MCV: 88.5 fL (ref 80.0–100.0)
Platelets: 142 10*3/uL — ABNORMAL LOW (ref 150–400)
RBC: 4.25 MIL/uL (ref 4.22–5.81)
RDW: 12.9 % (ref 11.5–15.5)
WBC: 5.7 10*3/uL (ref 4.0–10.5)
nRBC: 0 % (ref 0.0–0.2)

## 2019-12-15 LAB — CREATININE, SERUM
Creatinine, Ser: 1.15 mg/dL (ref 0.61–1.24)
GFR calc Af Amer: >60 mL/min
GFR calc non Af Amer: 58 mL/min — ABNORMAL LOW

## 2019-12-15 NOTE — Progress Notes (Signed)
Physical Therapy Treatment Patient Details Name: Kristopher Fernandez MRN: [id number] DOB: 08/26/1932 Today's Date: 12/15/2019    History of Present Illness 85 yo male who presented to ED with subjective fever after a prostate biopsy and c/o rectum pain. PMH includes HLD, HTN, depression, pacemaker, CAD s/p stents, chronic combined CHF    PT Comments    Exited for therapy today.  Reports doing HEP on his own the past few days.  To edge of bed with rail and HOB raised.  No outside assist needed.  He is able to stand with min a x 1.  Some unsteadiness initially with min assist to recover.  He is able to progress gait to chair outside of doorway, sit and talk with MD.  He walks back to recliner, completes standing ex as below then makes another lap back to door and back.  Gait progressed during session.  Pt remains highly resistant to SNF/rehab.  While it would be beneficial to increase overall strength, balance and gait deficits, he is choosing to return home despite education on risks.  Encouraged him to have +1 assist with mobility for safety.  He voiced understanding but continues to state "I'll be fine."  He stated he has equipment needed at home.     Follow Up Recommendations  SNF;Other (comment)     Equipment Recommendations  Rolling walker with 5" wheels    Recommendations for Other Services       Precautions / Restrictions Precautions Precautions: Fall Restrictions Weight Bearing Restrictions: No    Mobility  Bed Mobility Overal bed mobility: Modified Independent Bed Mobility: Supine to Sit Rolling: Supervision            Transfers Overall transfer level: Needs assistance Equipment used: Rolling walker (2 wheeled) Transfers: Sit to/from Stand Sit to Stand: Min assist            Ambulation/Gait Ambulation/Gait assistance: Min guard;Min assist Gait Distance (Feet): 60 Feet Assistive device: Rolling walker (2 wheeled) Gait Pattern/deviations: Step-through  pattern;Decreased step length - right;Decreased step length - left;Trunk flexed;Narrow base of support Gait velocity: decreased       Stairs             Wheelchair Mobility    Modified Rankin (Stroke Patients Only)       Balance Overall balance assessment: Needs assistance Sitting-balance support: Feet supported;Single extremity supported Sitting balance-Leahy Scale: Fair     Standing balance support: Bilateral upper extremity supported Standing balance-Leahy Scale: Fair                              Cognition Arousal/Alertness: Awake/alert Behavior During Therapy: WFL for tasks assessed/performed Overall Cognitive Status: Within Functional Limits for tasks assessed                                        Exercises Other Exercises Other Exercises: standing - marching, SLR , squats and ab/add 5-10 reps each    General Comments        Pertinent Vitals/Pain Pain Assessment: No/denies pain    Home Living                      Prior Function            PT Goals (current goals can now be found in the care plan section) Progress  towards PT goals: Progressing toward goals    Frequency    Min 2X/week      PT Plan Current plan remains appropriate    Co-evaluation              AM-PAC PT "6 Clicks" Mobility   Outcome Measure  Help needed turning from your back to your side while in a flat bed without using bedrails?: None Help needed moving from lying on your back to sitting on the side of a flat bed without using bedrails?: A Little Help needed moving to and from a bed to a chair (including a wheelchair)?: A Little Help needed standing up from a chair using your arms (e.g., wheelchair or bedside chair)?: A Little Help needed to walk in hospital room?: A Little Help needed climbing 3-5 steps with a railing? : A Little 6 Click Score: 19    End of Session Equipment Utilized During Treatment: Gait  belt Activity Tolerance: Patient tolerated treatment well Patient left: in chair;with call bell/phone within reach;with chair alarm set Nurse Communication: Mobility status       Time: [id number] PT Time Calculation (min) (ACUTE ONLY): 23 min  Charges:  $Gait Training: 8-22 mins $Therapeutic Exercise: 8-22 mins                    Chesley Noon, PTA 12/15/19, 10:25 AM

## 2019-12-15 NOTE — TOC Transition Note (Signed)
Transition of Care [facility]) - CM/SW Discharge Note   Patient Details  Name: Kristopher Fernandez MRN: [id number] Date of Birth: 20-Feb-1932  Transition of Care [facility]) CM/SW Contact:  Marshell Garfinkel, RN Phone Number: 12/15/2019, 12:14 PM   Clinical Narrative:    Patient will be going home today. His son in law [name] will provide transportation. Patient prefers [facility] home [facility] and Rubin Payor is aware of discharge today. I spoke with patient's daughter [name] [phone] and made her aware of PT concerns for patient and that he will need supervision in the home- she agrees. No other RNCM needs.    Final next level of care: [place] Barriers to Discharge: No Barriers Identified   Patient Goals and CMS Choice Patient states their goals for this hospitalization and ongoing recovery are:: "return to home with home health" CMS Medicare.gov Compare Post Acute Care list provided to:: Patient Choice offered to / list presented to : Patient, Adult Children([name])  Discharge Placement                       Discharge Plan and Services   Discharge Planning Services: CM Consult Post Acute Care Choice: Home Health                    HH Arranged: PT, OT [facility] Agency: [facility] Date [place]: 12/15/19 Time [place]: 1214 Representative spoke with at [place]: Cassie  Social Determinants of Health ([place]) Interventions     Readmission Risk Interventions Readmission Risk Prevention Plan 05/16/2019  Transportation Screening Complete  PCP or Specialist Appt within 5-7 Days Complete  Home Care Screening Complete  Medication Review (RN CM) Complete  Some recent data might be hidden

## 2019-12-15 NOTE — Discharge Summary (Signed)
Physician Discharge Summary  Kristopher Fernandez [id number] DOB: [birth year] DOA: 12/11/2019  PCP: Tracie Harrier, MD  Admit date: 12/11/2019 Discharge date: 12/15/2019  Discharge disposition: Home with home health PT and OT   Recommendations for Outpatient Follow-Up:   Outpatient follow-up with PCP   Discharge Diagnosis:   Principal Problem:   Chills without fever Active Problems:   CHF (congestive heart failure) (HCC)   Rectal pain   Thrombocytopenia (HCC)   Generalized weakness    Discharge Condition: Stable.  Diet recommendation: Low-salt diet  Code status: Full code.    Hospital Course:     [name] is an 85 year old man with medical history significant for hypertension, hyperlipidemia, depression, pacemaker, CAD status post coronary stent, combined systolic and diastolic CHF.  To the hospital because of chills, generalized weakness and rectal pain on 12/11/2019.  He had undergone transrectal ultrasound and biopsy of the prostate earlier that day which was done by the urologist.  He was admitted to the hospital because of concern for bacteremia following transrectal biopsy.   He had abnormal urinalysis but urine culture and blood culture did not show any growth.  He was given 1 dose of Zosyn in the emergency room but this was not continued during hospitalization.  He complained of generalized weakness and was unable to ambulate.  He was seen by PT who recommended further rehabilitation at a skilled nursing facility because he was unsteady on his feet.  However, patient insisted that he wanted to be discharged home instead of going to skilled nursing facility.  Even though he lives alone, he said his daughter sets them frequently and he will be okay at home.  This was discussed with his daughter Ms. Lilla Shook, who confirmed that she visits her dad frequently and she did not want her dad to be discharged to a skilled nursing facility.  They both understand  that the patient is at increased risk for falls with potential poor outcomes including but not limited to hip fracture or intracranial bleeding.  He is feeling better and he is deemed stable for discharge to home.  Patient said he feels well enough to be discharged home.      Discharge Exam:   Vitals:   12/15/19 0345 12/15/19 0733  BP: 118/64 132/68  Pulse: 68 67  Resp: 20   Temp: 97.9 F (36.6 C)   SpO2: 94% 97%   Vitals:   12/14/19 1452 12/14/19 2002 12/15/19 0345 12/15/19 0733  BP: 119/68 112/70 118/64 132/68  Pulse: 68 83 68 67  Resp: 18 19 20    Temp: 97.9 F (36.6 C) 98.6 F (37 C) 97.9 F (36.6 C)   TempSrc: Oral Oral Oral   SpO2: 100% 95% 94% 97%  Weight:      Height:         GEN: NAD SKIN: No rash EYES: EOMI ENT: MMM CV: RRR PULM: CTA B ABD: soft, ND, NT, +BS CNS: AAO x 3, non focal EXT: No edema or tenderness   The results of significant diagnostics from this hospitalization (including imaging, microbiology, ancillary and laboratory) are listed below for reference.     Procedures and Diagnostic Studies:   [place] Transrectal Complete  Result Date: 12/11/2019 Please see Notes tab for imaging impression.  DG Chest Portable 1 View  Result Date: 12/11/2019 CLINICAL DATA:  Chills EXAM: PORTABLE CHEST 1 VIEW COMPARISON:  05/18/2019 FINDINGS: Right basilar scarring. Blunting of the left costophrenic angle likely reflects chronic pleural thickening. No  new consolidation or edema. Stable cardiomediastinal contours. Left chest wall dual lead pacemaker is present. IMPRESSION: No acute process in the chest. Electronically Signed   By: Macy Mis M.D.   On: 12/11/2019 14:16   [place] PROSTATE BIOPSY MULTIPLE  Result Date: 12/11/2019 Please see Notes tab for imaging impression.    Labs:   Basic Metabolic Panel: Recent Labs  Lab 12/11/19 1338 12/12/19 0814 12/13/19 0438 12/15/19 0541  NA 134* 135 137  --   K 4.0 4.1 4.2  --   CL 103 102 105  --   CO2  20* 25 23  --   GLUCOSE 159* 113* 105*  --   BUN 28* 27* 26*  --   CREATININE 1.36* 1.49* 1.27* 1.15  CALCIUM 8.7* 8.7* 8.8*  --    GFR Estimated Creatinine Clearance: 51.5 mL/min (by C-G formula based on SCr of 1.15 mg/dL). Liver Function Tests: Recent Labs  Lab 12/11/19 1338  AST 24  ALT 14  ALKPHOS 88  BILITOT 0.9  PROT 6.4*  ALBUMIN 3.2*   No results for input(s): LIPASE, AMYLASE in the last 168 hours. No results for input(s): AMMONIA in the last 168 hours. Coagulation profile No results for input(s): INR, PROTIME in the last 168 hours.  CBC: Recent Labs  Lab 12/11/19 1338 12/12/19 0814 12/13/19 0438 12/15/19 0541  WBC 7.2 6.4 6.2 5.7  NEUTROABS 6.7  --   --   --   HGB 12.5* 11.2* 11.7* 12.7*  HCT 36.6* 32.5* 32.8* 37.6*  MCV 90.1 86.9 86.1 88.5  PLT 93* 81* 93* 142*   Cardiac Enzymes: No results for input(s): CKTOTAL, CKMB, CKMBINDEX, TROPONINI in the last 168 hours. BNP: Invalid input(s): POCBNP CBG: No results for input(s): GLUCAP in the last 168 hours. D-Dimer No results for input(s): DDIMER in the last 72 hours. Hgb A1c No results for input(s): HGBA1C in the last 72 hours. Lipid Profile No results for input(s): CHOL, HDL, LDLCALC, TRIG, CHOLHDL, LDLDIRECT in the last 72 hours. Thyroid function studies No results for input(s): TSH, T4TOTAL, T3FREE, THYROIDAB in the last 72 hours.  Invalid input(s): FREET3 Anemia work up No results for input(s): VITAMINB12, FOLATE, FERRITIN, TIBC, IRON, RETICCTPCT in the last 72 hours. Microbiology Recent Results (from the past 240 hour(s))  SARS CORONAVIRUS 2 (TAT 6-24 HRS) Nasopharyngeal Nasopharyngeal Swab     Status: None   Collection Time: 12/06/19 12:56 PM   Specimen: Nasopharyngeal Swab  Result Value Ref Range Status   SARS Coronavirus 2 NEGATIVE NEGATIVE Final    Comment: (NOTE) SARS-CoV-2 target nucleic acids are NOT DETECTED. The SARS-CoV-2 RNA is generally detectable in upper and lower respiratory  specimens during the acute phase of infection. Negative results do not preclude SARS-CoV-2 infection, do not rule out co-infections with other pathogens, and should not be used as the sole basis for treatment or other patient management decisions. Negative results must be combined with clinical observations, patient history, and epidemiological information. The expected result is Negative. Fact Sheet for Patients: SugarRoll.be Fact Sheet for Healthcare Providers: https://www.woods-mathews.com/ This test is not yet approved or cleared by the [place] FDA and  has been authorized for detection and/or diagnosis of SARS-CoV-2 by FDA under an Emergency Use Authorization (EUA). This EUA will remain  in effect (meaning this test can be used) for the duration of the COVID-19 declaration under Section 56 4(b)(1) of the Act, 21 U.S.C. section 360bbb-3(b)(1), unless the authorization is terminated or revoked sooner. Performed at [facility], [address] [place], [place] [address]   Culture, blood (routine x 2)     Status: None (Preliminary result)   Collection Time: 12/11/19  3:32 PM   Specimen: BLOOD  Result Value Ref Range Status   Specimen Description BLOOD BLOOD LEFT HAND  Final   Special Requests   Final    BOTTLES DRAWN AEROBIC AND ANAEROBIC Blood Culture adequate volume   Culture   Final    NO GROWTH 4 DAYS Performed at [facility], [address], [place], [place] [address]    Report Status PENDING  Incomplete  Culture, blood (routine x 2)     Status: None (Preliminary result)   Collection Time: 12/11/19  3:37 PM   Specimen: BLOOD  Result Value Ref Range Status   Specimen Description BLOOD BLOOD RIGHT HAND  Final   Special Requests   Final    BOTTLES DRAWN AEROBIC AND ANAEROBIC Blood Culture adequate volume   Culture   Final    NO GROWTH 4 DAYS Performed at [facility], [address]  [place], [place] [address]    Report Status PENDING  Incomplete  Urine Culture     Status: None   Collection Time: 12/11/19  3:49 PM   Specimen: Urine, Random  Result Value Ref Range Status   Specimen Description   Final    URINE, RANDOM Performed at [facility], [address], [place], [place] [address]    Special Requests   Final    NONE Performed at [facility], [address] [place], [place] [address]    Culture   Final    NO GROWTH Performed at [facility], [place] [address], [place], [place] [address]    Report Status 12/13/2019 FINAL  Final  SARS CORONAVIRUS 2 (TAT 6-24 HRS) Nasopharyngeal Nasopharyngeal Swab     Status: None   Collection Time: 12/11/19  5:12 PM   Specimen: Nasopharyngeal Swab  Result Value Ref Range Status   SARS Coronavirus 2 NEGATIVE NEGATIVE Final    Comment: (NOTE) SARS-CoV-2 target nucleic acids are NOT DETECTED. The SARS-CoV-2 RNA is generally detectable in upper and lower respiratory specimens during the acute phase of infection. Negative results do not preclude SARS-CoV-2 infection, do not rule out co-infections with other pathogens, and should not be used as the sole basis for treatment or other patient management decisions. Negative results must be combined with clinical observations, patient history, and epidemiological information. The expected result is Negative. Fact Sheet for Patients: SugarRoll.be Fact Sheet for Healthcare Providers: https://www.woods-mathews.com/ This test is not yet approved or cleared by the [place] FDA and  has been authorized for detection and/or diagnosis of SARS-CoV-2 by FDA under an Emergency Use Authorization (EUA). This EUA will remain  in effect (meaning this test can be used) for the duration of the COVID-19 declaration under Section 56 4(b)(1) of the Act, 21 U.S.C. section 360bbb-3(b)(1), unless the authorization is terminated  or revoked sooner. Performed at [facility], [place] [address], [place], [place] [address]      Discharge Instructions:   Discharge Instructions    Diet - low sodium heart healthy   Complete by: As directed    Face-to-face encounter (required for Medicare/Medicaid patients)   Complete by: As directed    I [place] certify that this patient is under my care and that I, or a nurse practitioner or physician's assistant working with me, had a face-to-face encounter that meets  the physician face-to-face encounter requirements with this patient on 12/15/2019. The encounter with the patient was in whole, or in part for the following medical condition(s) which is the primary reason for home health care (List medical condition): Generalized weakness, unsteady gait   The encounter with the patient was in whole, or in part, for the following medical condition, which is the primary reason for home health care: Generalized weakness, unsteady gait   I certify that, based on my findings, the following services are medically necessary home health services: Physical therapy   Reason for Medically Necessary Home Health Services: Therapy- Personnel officer, Public librarian   My clinical findings support the need for the above services: Unable to leave home safely without assistance and/or assistive device   Further, I certify that my clinical findings support that this patient is homebound due to: Unable to leave home safely without assistance   Home Health   Complete by: As directed    To provide the following care/treatments:  PT OT     Increase activity slowly   Complete by: As directed      Allergies as of 12/15/2019   No Known Allergies     Medication List    STOP taking these medications   amoxicillin 875 MG tablet Commonly known as: AMOXIL     TAKE these medications   acetaminophen 500 MG tablet Commonly known as: TYLENOL Take 1,000 mg by mouth every 6 (six)  hours as needed for mild pain or moderate pain.   apixaban 5 MG Tabs tablet Commonly known as: ELIQUIS Take 1 tablet (5 mg total) by mouth 2 (two) times Fernandez.   atorvastatin 80 MG tablet Commonly known as: LIPITOR Take 80 mg by mouth Fernandez at 6 PM.   budesonide-formoterol 160-4.5 MCG/ACT inhaler Commonly known as: Symbicort Inhale 2 puffs into the lungs 2 (two) times Fernandez. What changed:   when to take this  reasons to take this   carvedilol 3.125 MG tablet Commonly known as: COREG Take 1 tablet (3.125 mg total) by mouth 2 (two) times Fernandez with a meal. What changed: when to take this   furosemide 20 MG tablet Commonly known as: Lasix Take 1 tablet (20 mg total) by mouth as needed (weight. Take one tablet if weight increases by 3 pounds overnight or 5 pounds in one week.).   meclizine 25 MG tablet Commonly known as: ANTIVERT Take 1 tablet (25 mg total) by mouth 3 (three) times Fernandez as needed for dizziness.   ranolazine 500 MG 12 hr tablet Commonly known as: RANEXA Take 500 mg by mouth 2 (two) times Fernandez.   spironolactone 25 MG tablet Commonly known as: ALDACTONE Take 1 tablet (25 mg total) by mouth at bedtime.   venlafaxine XR 75 MG 24 hr capsule Commonly known as: EFFEXOR-XR Take 1 capsule (75 mg total) by mouth Fernandez with breakfast.         Time coordinating discharge: 28 minutes  Signed:  Jahkeem Kurka  [facility] 12/15/2019, 10:32 AM

## 2019-12-15 NOTE — Progress Notes (Signed)
Patient discharged home with home health. All discharge instructions given and all questions answered. 

## 2019-12-16 LAB — CULTURE, BLOOD (ROUTINE X 2)
Culture: NO GROWTH
Culture: NO GROWTH
Special Requests: ADEQUATE
Special Requests: ADEQUATE

## 2019-12-26 ENCOUNTER — Telehealth: Payer: Self-pay | Admitting: Urology

## 2019-12-26 NOTE — Telephone Encounter (Signed)
Called patient to discuss prostate pathology report and left message on vm to call back

## 2019-12-26 NOTE — Telephone Encounter (Signed)
Kristopher Fernandez called back regarding my previous voicemail.  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  We discussed the pathology report in detail.  His PSA was 310 and the need for a metastatic evaluation was discussed including bone scan and CT.

## 2019-12-29 ENCOUNTER — Other Ambulatory Visit: Payer: Self-pay | Admitting: Urology

## 2019-12-29 DIAGNOSIS — C61 Malignant neoplasm of prostate: Secondary | ICD-10-CM

## 2020-01-07 ENCOUNTER — Telehealth (HOSPITAL_COMMUNITY): Payer: Self-pay

## 2020-01-07 NOTE — Telephone Encounter (Signed)

## 2020-01-08 ENCOUNTER — Other Ambulatory Visit (HOSPITAL_COMMUNITY): Payer: Self-pay

## 2020-01-08 ENCOUNTER — Encounter (HOSPITAL_COMMUNITY): Payer: Self-pay | Admitting: Cardiology

## 2020-01-08 ENCOUNTER — Other Ambulatory Visit: Payer: Self-pay

## 2020-01-08 ENCOUNTER — Ambulatory Visit (HOSPITAL_COMMUNITY)
Admission: RE | Admit: 2020-01-08 | Discharge: 2020-01-08 | Disposition: A | Discharge status: Home / Self Care | Payer: Medicare Other | Source: Ambulatory Visit | Attending: Cardiology | Admitting: Cardiology

## 2020-01-08 VITALS — BP 118/59 | HR 60 | Wt 184.0 lb

## 2020-01-08 DIAGNOSIS — C61 Malignant neoplasm of prostate: Secondary | ICD-10-CM | POA: Diagnosis not present

## 2020-01-08 DIAGNOSIS — Z79899 Other long term (current) drug therapy: Secondary | ICD-10-CM | POA: Insufficient documentation

## 2020-01-08 DIAGNOSIS — I255 Ischemic cardiomyopathy: Secondary | ICD-10-CM | POA: Insufficient documentation

## 2020-01-08 DIAGNOSIS — I2510 Atherosclerotic heart disease of native coronary artery without angina pectoris: Secondary | ICD-10-CM | POA: Insufficient documentation

## 2020-01-08 DIAGNOSIS — J449 Chronic obstructive pulmonary disease, unspecified: Secondary | ICD-10-CM | POA: Diagnosis not present

## 2020-01-08 DIAGNOSIS — Z8673 Personal history of transient ischemic attack (TIA), and cerebral infarction without residual deficits: Secondary | ICD-10-CM | POA: Diagnosis not present

## 2020-01-08 DIAGNOSIS — I5022 Chronic systolic (congestive) heart failure: Secondary | ICD-10-CM

## 2020-01-08 DIAGNOSIS — I252 Old myocardial infarction: Secondary | ICD-10-CM | POA: Diagnosis not present

## 2020-01-08 DIAGNOSIS — I951 Orthostatic hypotension: Secondary | ICD-10-CM | POA: Diagnosis not present

## 2020-01-08 DIAGNOSIS — N183 Chronic kidney disease, stage 3 unspecified: Secondary | ICD-10-CM | POA: Insufficient documentation

## 2020-01-08 DIAGNOSIS — I130 Hypertensive heart and chronic kidney disease with heart failure and stage 1 through stage 4 chronic kidney disease, or unspecified chronic kidney disease: Secondary | ICD-10-CM | POA: Insufficient documentation

## 2020-01-08 DIAGNOSIS — Z87891 Personal history of nicotine dependence: Secondary | ICD-10-CM | POA: Insufficient documentation

## 2020-01-08 DIAGNOSIS — I495 Sick sinus syndrome: Secondary | ICD-10-CM | POA: Insufficient documentation

## 2020-01-08 DIAGNOSIS — I350 Nonrheumatic aortic (valve) stenosis: Secondary | ICD-10-CM

## 2020-01-08 DIAGNOSIS — Z950 Presence of cardiac pacemaker: Secondary | ICD-10-CM | POA: Diagnosis not present

## 2020-01-08 DIAGNOSIS — Z7901 Long term (current) use of anticoagulants: Secondary | ICD-10-CM | POA: Insufficient documentation

## 2020-01-08 DIAGNOSIS — E785 Hyperlipidemia, unspecified: Secondary | ICD-10-CM | POA: Insufficient documentation

## 2020-01-08 DIAGNOSIS — F329 Major depressive disorder, single episode, unspecified: Secondary | ICD-10-CM | POA: Diagnosis not present

## 2020-01-08 LAB — BASIC METABOLIC PANEL
Anion gap: 6 (ref 5–15)
BUN: 27 mg/dL — ABNORMAL HIGH (ref 8–23)
CO2: 27 mmol/L (ref 22–32)
Calcium: 9.3 mg/dL (ref 8.9–10.3)
Chloride: 105 mmol/L (ref 98–111)
Creatinine, Ser: 1.62 mg/dL — ABNORMAL HIGH (ref 0.61–1.24)
GFR calc Af Amer: 44 mL/min — ABNORMAL LOW (ref >60)
GFR calc non Af Amer: 38 mL/min — ABNORMAL LOW (ref >60)
Glucose, Bld: 104 mg/dL — ABNORMAL HIGH (ref 70–99)
Potassium: 5.3 mmol/L — ABNORMAL HIGH (ref 3.5–5.1)
Sodium: 138 mmol/L (ref 135–145)

## 2020-01-08 LAB — CBC
HCT: 40.5 % (ref 39.0–52.0)
Hemoglobin: 13.1 g/dL (ref 13.0–17.0)
MCH: 30.3 pg (ref 26.0–34.0)
MCHC: 32.3 g/dL (ref 30.0–36.0)
MCV: 93.8 fL (ref 80.0–100.0)
Platelets: 134 10*3/uL — ABNORMAL LOW (ref 150–400)
RBC: 4.32 MIL/uL (ref 4.22–5.81)
RDW: 13.1 % (ref 11.5–15.5)
WBC: 6.5 10*3/uL (ref 4.0–10.5)
nRBC: 0 % (ref 0.0–0.2)

## 2020-01-08 LAB — LIPID PANEL
Cholesterol: 123 mg/dL (ref 0–200)
HDL: 57 mg/dL (ref >40)
LDL Cholesterol: 49 mg/dL (ref 0–99)
Total CHOL/HDL Ratio: 2.2 RATIO
Triglycerides: 86 mg/dL (ref <150)
VLDL: 17 mg/dL (ref 0–40)

## 2020-01-08 NOTE — Patient Instructions (Addendum)
No medication changes today!  Labs today We will only contact you if something comes back abnormal or we need to make some changes. Otherwise no news is good news!   You have been ordered a PYP Scan.  This is done in the Radiology Department of [facility].  When you come for this test please plan to be there 2-3 hours.  You will get a call to schedule this appointment.   Your physician recommends that you schedule a follow-up appointment in: 2 month with Dr Aundra Dubin.   Please call office at [phone] option 2 if you have any questions or concerns.   At the [facility], you and your health needs are our priority. As part of our continuing mission to provide you with exceptional heart care, we have created designated Provider Care Teams. These Care Teams include your primary Cardiologist (physician) and Advanced Practice Providers (APPs- Physician Assistants and Nurse Practitioners) who all work together to provide you with the care you need, when you need it.   You may see any of the following providers on your designated Care Team at your next follow up: Marland Kitchen Dr Glori Bickers . Dr Loralie Champagne . Darrick Grinder, NP . Lyda Jester, PA . Audry Riles, PharmD   Please be sure to bring in all your medications bottles to every appointment.

## 2020-01-09 ENCOUNTER — Encounter (HOSPITAL_BASED_OUTPATIENT_CLINIC_OR_DEPARTMENT_OTHER)
Admission: RE | Admit: 2020-01-09 | Discharge: 2020-01-09 | Disposition: A | Discharge status: Home / Self Care | Payer: Medicare Other | Source: Ambulatory Visit | Attending: Urology | Admitting: Urology

## 2020-01-09 ENCOUNTER — Encounter
Admission: RE | Admit: 2020-01-09 | Discharge: 2020-01-09 | Disposition: A | Discharge status: Home / Self Care | Payer: Medicare Other | Source: Ambulatory Visit | Attending: Urology | Admitting: Urology

## 2020-01-09 ENCOUNTER — Ambulatory Visit
Admission: RE | Admit: 2020-01-09 | Discharge: 2020-01-09 | Disposition: A | Discharge status: Home / Self Care | Payer: Medicare Other | Source: Ambulatory Visit | Attending: Urology | Admitting: Urology

## 2020-01-09 ENCOUNTER — Other Ambulatory Visit (HOSPITAL_COMMUNITY): Payer: Self-pay

## 2020-01-09 DIAGNOSIS — I350 Nonrheumatic aortic (valve) stenosis: Secondary | ICD-10-CM

## 2020-01-09 DIAGNOSIS — C61 Malignant neoplasm of prostate: Secondary | ICD-10-CM | POA: Insufficient documentation

## 2020-01-09 LAB — IMMUNOFIXATION, URINE

## 2020-01-09 LAB — POCT I-STAT CREATININE: Creatinine, Ser: 1.4 mg/dL — ABNORMAL HIGH (ref 0.61–1.24)

## 2020-01-09 MED ORDER — IOHEXOL 300 MG/ML  SOLN
75.0000 mL | Freq: Once | INTRAMUSCULAR | Status: AC | PRN
  Administered 2020-01-09: 75 mL via INTRAVENOUS

## 2020-01-09 MED ORDER — TECHNETIUM TC 99M MEDRONATE IV KIT
20.0000 | PACK | Freq: Once | INTRAVENOUS | Status: AC | PRN
  Administered 2020-01-09: 09:00:00 23.83 via INTRAVENOUS

## 2020-01-09 NOTE — Progress Notes (Signed)
Per Dr Aundra Dubin, TEE cancelled. Pt requires low dose dobutamine stress echo. Order placed, Butch Penny to coordinate on a day Dr Aundra Dubin is in office

## 2020-01-09 NOTE — Addendum Note (Signed)
Encounter addended by: Larey Dresser, MD on: 01/09/2020 11:41 AM  Actions taken: Clinical Note Signed

## 2020-01-09 NOTE — Progress Notes (Addendum)
PCP: Tracie Harrier, MD Cardiology: Dr. Saralyn Pilar  HF Cardiology: Dr. Aundra Dubin  86 y.o. with history of CAD s/p multiple PCIs, ischemic cardiomyopathy, sick sinus syndrome s/p PPM, and CKD stage 3 returns for followup of CHF and lightheadedness.   Patient has a long history of CAD detailed in the [place] below.  He also has an ischemic cardiomyopathy, with echo in 1/20 showing EF 40-45%. He had a Medtronic pacemaker placed for bradycardia in 2012. He was admitted with somewhat atypical chest pain in 5/20, TnI was mildly elevated at 0.08, with serial troponins there was no trend (maximal 0.09).  He underwent left heart cath, showing stable disease with no intervention planned (chronic occlusion of mid LCx and 85% ostial PDA).  His echo showed that EF was lower than prior at 20-25%.  He was thought to be volume overloaded.  He was diuresed with IV Lasix, and Coreg + Entresto were started.  Patient's BP fell significantly with initiation of cardiac meds and he had orthostatic symptoms.  All his cardiac meds had to be stopped.  There was concern that he may have low output HF and may require inotropic support, so he was transferred to [facility].  RHC at [facility] showed low filling pressures and normal cardiac output.  He continued to have dizziness and also cerebellar signs.  He was seen by neurology and suspected to have a cerebellar CVA.  CTA head showed multiple old small vessel infarcts, no MRI done due to PPM.  Eliquis was started based on neurology recommendation given low EF and CVA.  He gradually improved and was discharged home.   Echo was done in 10/20, ordered by neurology.  EF remained 20-25%, but aortic stenosis was reported as severe. I had not seen this echo prior but reviewed today. Calculated AVA < 1 but highest mean gradient obtained was 16 mmHg.  Based on appearance of valve, this certainly could be low gradient severe AS but valve not seen well.    He returns today for followup of CHF.   Symptomatically, he is doing well.  No dyspnea walking around the house or yard, able to do some light yardwork.  No chest pain.  No lightheadedness, syncope, or falls.  No orthopnea/PND. Patient was recently diagnosed with prostate cancer and is being evaluated by urology in [place].   Labs (6/20): K 5.1 => 4.2, creatinine 1.46 => 1.33, LDL 75 Labs (12/20): K 4.2, creatinine 1.27  ECG (personally reviewed, 12/20): NSR, LVH with repolarization abnormality.  Not low voltage.   Past Medical History: 1. Low back pain/degenerative disc disease.  2. Sick sinus syndrome: Dual chamber Medtronic PPM placed 8/12.  3. Aortic stenosis: Mild to moderate on 5/20 echo.  - Echo in 10/20 with ?low gradient severe AS with mean gradient only 15 mmHg but calculated AVA 0.78 cm^2.  4. CAD: Extensive history.  - 7/06: Inferior MI with Cypher DES to prox and distal RCA.  - 07/20/05: Taxus DES to mLAD.  - 1/11: Xience DES to OM1 - 4/15: Overlapping Xience DES x 2 to ostial and proximal RCA.  - LHC (5/20): Nonobstructive disease in LAD, occluded mid LCx (chronic), 85% ostial PDA.  No intervention.  5. Chronic systolic CHF: Ischemic cardiomyopathy.   - Echo (1/20) with EF 40-45%.  - Echo (5/20) with EF 20-25%, normal RV, mild-moderate MR, mild-moderate AS with mean gradient 11 mmHg and AVA 1.47 cm^2.  - RHC (5/20): mean RA 3, PA 38/8 mean 18, mean PCWP 6, CI 2.68.  -  Echo (10/20): EF 20-25%, possible low gradient severe AS.  6. CKD: Stage 3.  7. COPD: Remote smoking. 8. HTN 9. Hyperlipidemia 10. Depression 11. CVA: Suspected cerebellar CVA in 5/20.  Eliquis started.  - CTA head (unable to do MRI with PPM): <50% carotid stenosis bilaterally.  Multiple old small vessel infarcts.  12. Prostate Cancer  Social History   Socioeconomic History  . Marital status: Widowed    Spouse name: Not on file  . Number of children: 1  . Years of education: Not on file  . Highest education level: Not on file   Occupational History  . Occupation: retired  Tobacco Use  . Smoking status: Former Smoker    Years: 10.00    Types: Cigarettes    Quit date: 1977    Years since quitting: 44.0  . Smokeless tobacco: Former Network engineer and Sexual Activity  . Alcohol use: No  . Drug use: No  . Sexual activity: Not on file  Other Topics Concern  . Not on file  Social History Narrative  . Not on file   Social Determinants of Health   Financial Resource Strain: Low Risk   . Difficulty of Paying Living Expenses: Not very hard  Food Insecurity: No Food Insecurity  . Worried About Charity fundraiser in the Last Year: Never true  . Ran Out of Food in the Last Year: Never true  Transportation Needs: No Transportation Needs  . Lack of Transportation (Medical): No  . Lack of Transportation (Non-Medical): No  Physical Activity:   . Days of Exercise per Week: Not on file  . Minutes of Exercise per Session: Not on file  Stress: No Stress Concern Present  . Feeling of Stress : Only a little  Social Connections: Unknown  . Frequency of Communication with Friends and Family: More than three times a week  . Frequency of Social Gatherings with Friends and Family: Not on file  . Attends Religious Services: Not on file  . Active Member of Clubs or Organizations: Not on file  . Attends Archivist Meetings: Not on file  . Marital Status: Widowed  Intimate Partner Violence: Unknown  . Fear of Current or Ex-Partner: No  . Emotionally Abused: No  . Physically Abused: Not on file  . Sexually Abused: Not on file   Family History  Problem Relation Age of Onset  . Bladder Cancer Neg Hx   . Kidney cancer Neg Hx   . Prostate cancer Neg Hx    ROS: All systems reviewed and negative except as per HPI.   Current Outpatient Medications  Medication Sig Dispense Refill  . acetaminophen (TYLENOL) 500 MG tablet Take 1,000 mg by mouth every 6 (six) hours as needed for mild pain or moderate pain.     Marland Kitchen  apixaban (ELIQUIS) 5 MG TABS tablet Take 1 tablet (5 mg total) by mouth 2 (two) times daily. 60 tablet 6  . atorvastatin (LIPITOR) 80 MG tablet Take 80 mg by mouth daily at 6 PM.     . budesonide-formoterol (SYMBICORT) 160-4.5 MCG/ACT inhaler Inhale 2 puffs into the lungs 2 (two) times daily. (Patient not taking: Reported on 01/08/2020) 1 Inhaler 12  . carvedilol (COREG) 3.125 MG tablet Take 1 tablet (3.125 mg total) by mouth 2 (two) times daily with a meal. 60 tablet 6  . furosemide (LASIX) 20 MG tablet Take 1 tablet (20 mg total) by mouth as needed (weight. Take one tablet if weight increases by 3  pounds overnight or 5 pounds in one week.). (Patient taking differently: Take 20 mg by mouth as needed for edema (weight. Take one tablet if weight increases by 3 pounds overnight or 5 pounds in one week.). ) 30 tablet 0  . meclizine (ANTIVERT) 25 MG tablet Take 1 tablet (25 mg total) by mouth 3 (three) times daily as needed for dizziness. 30 tablet 0  . ranolazine (RANEXA) 500 MG 12 hr tablet Take 500 mg by mouth 2 (two) times daily.    Marland Kitchen spironolactone (ALDACTONE) 25 MG tablet Take 1 tablet (25 mg total) by mouth at bedtime. 90 tablet 3  . venlafaxine XR (EFFEXOR-XR) 75 MG 24 hr capsule Take 1 capsule (75 mg total) by mouth daily with breakfast. 30 capsule 0   No current facility-administered medications for this encounter.     BP (!) 118/59   Pulse 60   Wt 83.5 kg (184 lb)   SpO2 98%   BMI 24.95 kg/m  General: NAD Neck: No JVD, no thyromegaly or thyroid nodule.  Lungs: Clear to auscultation bilaterally with normal respiratory effort. CV: Nondisplaced PMI.  Heart regular S1/S2, no S3/S4, 3/6 SEM RUSB with some obscuring of S2.  No peripheral edema.  No carotid bruit.  Normal pedal pulses.  Abdomen: Soft, nontender, no hepatosplenomegaly, no distention.  Skin: Intact without lesions or rashes.  Neurologic: Alert and oriented x 3.  Psych: Normal affect. Extremities: No clubbing or cyanosis.   HEENT: Normal.    Assessment/Plan: 1. Chronic systolic CHF: Ischemic cardiomyopathy. Echo in 1/20 with EF 40-45%, but now down to 20-25% on echo from 5/20. The cause of the fall in EF is uncertain, he had coronary angi[facility] in 5/20 with stable anatomy (no intervention).  He was been pacing his RV only 5% of the time when device was interrogated.  RHC in 5/20 showed low filling pressures and good cardiac output.  Echo in 10/20 showed stable EF 20-25% range.  BP has tended to run low with orthostatic hypotension when cardiac meds are added but doing well on current regimen.  NYHA class II symptoms currently, feels good.  Not volume overloaded.  - Continue Coreg 3.125 mg bid.    - He does not need a diuretic.  - Would hold off on ACEI/ARB/ARNI given orthostasis/soft BP.  - Continue spironolactone 25 daily, BMET today.    - Given history of prominent orthostatic hypotension (as well as aortic stenosis), would be reasonable to workup for cardiac amyloidosis (most likely ATTR if he has it).  He does not have marked LVH or low voltage on ECG.  Cannot get MRI with PPM, I will arrange for PYP scan to assess for ATTR amyloidosis. Will also send myeloma panel, urine immunofixation.  2. CAD: Multiple interventions in the past.  Cath in 5/20 showed stable anatomy, no intervention. No chest pain.  - He was started on Eliquis per neurology due to CVA with low EF, now off ASA and Plavix.   - Continue atorvastatin 80 mg daily, check lipids today.   - Continue ranolazine.  - If severe AS confirmed, he will need RHC/LHC prior to AVR.  3. CKD: Stage 3. BMET today.  4. Aortic stenosis: Mild to moderate on echo in 5/20.  Echo from 10/20 was reviewed today (ordered by neurology and I had not seen before).  The valve is not well visualized by TTE but certainly appearance could be consistent with severe AS.  AVA 0.78 cm^2 with mean gradient only 15  mmHg.  ?Low gradient severe AS.  - I would like to get a low  dose dobutamine stress echo to assess for low gradient severe AS. He may end up requiring an aortic valve calcium score.  - He would be a TAVR candidate if needed.  5. Suspected cerebellar CVA: In 5/20.  Dizziness/?vertigo with dysmetria and diplopia on exam.  Seen by neurology and felt to have cerebellar stroke, repeat CT head with no acute changes (old small vessel infarcts). Carotid u/s 1-39% bilaterally. Vertebrals ok.  No MRI due to PPM.  Had CTA head/neck without large vessel occlusion or stenosis.  - He was started on apixaban and stopped ASA/Plavix with low EF.   6. Sick sinus syndrome: MDT dual chamber pacemaker, RV pacing appears rare. Needs pacemaker followup.    Loralie Champagne 01/09/2020

## 2020-01-10 LAB — MULTIPLE MYELOMA PANEL, SERUM
Albumin SerPl Elph-Mcnc: 3.3 g/dL (ref 2.9–4.4)
Albumin/Glob SerPl: 1.1 (ref 0.7–1.7)
Alpha 1: 0.2 g/dL (ref 0.0–0.4)
Alpha2 Glob SerPl Elph-Mcnc: 0.7 g/dL (ref 0.4–1.0)
B-Globulin SerPl Elph-Mcnc: 0.7 g/dL (ref 0.7–1.3)
Gamma Glob SerPl Elph-Mcnc: 1.4 g/dL (ref 0.4–1.8)
Globulin, Total: 3.1 g/dL (ref 2.2–3.9)
IgA: 90 mg/dL (ref 61–437)
IgG (Immunoglobin G), Serum: 1459 mg/dL (ref 603–1613)
IgM (Immunoglobulin M), Srm: 113 mg/dL (ref 15–143)
Total Protein ELP: 6.4 g/dL (ref 6.0–8.5)

## 2020-01-11 ENCOUNTER — Inpatient Hospital Stay: Admission: RE | Admit: 2020-01-11 | Payer: Medicare Other | Source: Ambulatory Visit

## 2020-01-11 ENCOUNTER — Encounter (HOSPITAL_COMMUNITY): Payer: Self-pay

## 2020-01-14 ENCOUNTER — Ambulatory Visit (HOSPITAL_COMMUNITY): Admission: RE | Admit: 2020-01-14 | Payer: Medicare Other | Source: Home / Self Care | Admitting: Cardiology

## 2020-01-14 ENCOUNTER — Encounter (HOSPITAL_COMMUNITY): Admission: RE | Payer: Self-pay | Source: Home / Self Care

## 2020-01-14 SURGERY — ECHOCARDIOGRAM, TRANSESOPHAGEAL
Anesthesia: Moderate Sedation

## 2020-01-17 ENCOUNTER — Other Ambulatory Visit
Admission: RE | Admit: 2020-01-17 | Discharge: 2020-01-17 | Disposition: A | Discharge status: Home / Self Care | Payer: Medicare Other | Source: Ambulatory Visit | Attending: Cardiology | Admitting: Cardiology

## 2020-01-17 DIAGNOSIS — Z20822 Contact with and (suspected) exposure to covid-19: Secondary | ICD-10-CM | POA: Insufficient documentation

## 2020-01-17 DIAGNOSIS — Z01812 Encounter for preprocedural laboratory examination: Secondary | ICD-10-CM | POA: Insufficient documentation

## 2020-01-17 LAB — SARS CORONAVIRUS 2 (TAT 6-24 HRS): SARS Coronavirus 2: NEGATIVE

## 2020-01-21 ENCOUNTER — Ambulatory Visit (HOSPITAL_COMMUNITY)
Admission: RE | Admit: 2020-01-21 | Discharge: 2020-01-21 | Disposition: A | Discharge status: Home / Self Care | Payer: Medicare Other | Source: Ambulatory Visit | Attending: Cardiology | Admitting: Cardiology

## 2020-01-21 ENCOUNTER — Other Ambulatory Visit: Payer: Self-pay

## 2020-01-21 DIAGNOSIS — N189 Chronic kidney disease, unspecified: Secondary | ICD-10-CM | POA: Diagnosis not present

## 2020-01-21 DIAGNOSIS — I252 Old myocardial infarction: Secondary | ICD-10-CM | POA: Insufficient documentation

## 2020-01-21 DIAGNOSIS — E785 Hyperlipidemia, unspecified: Secondary | ICD-10-CM | POA: Insufficient documentation

## 2020-01-21 DIAGNOSIS — I255 Ischemic cardiomyopathy: Secondary | ICD-10-CM | POA: Insufficient documentation

## 2020-01-21 DIAGNOSIS — I2510 Atherosclerotic heart disease of native coronary artery without angina pectoris: Secondary | ICD-10-CM | POA: Diagnosis not present

## 2020-01-21 DIAGNOSIS — I129 Hypertensive chronic kidney disease with stage 1 through stage 4 chronic kidney disease, or unspecified chronic kidney disease: Secondary | ICD-10-CM | POA: Diagnosis not present

## 2020-01-21 DIAGNOSIS — Z950 Presence of cardiac pacemaker: Secondary | ICD-10-CM | POA: Insufficient documentation

## 2020-01-21 DIAGNOSIS — I350 Nonrheumatic aortic (valve) stenosis: Secondary | ICD-10-CM | POA: Insufficient documentation

## 2020-01-21 MED ORDER — DOBUTAMINE INFUSION FOR EP/ECHO/NUC (1000 MCG/ML)
0.0000 ug/kg/min | INTRAVENOUS | Status: DC
  Administered 2020-01-21: 5 ug/kg/min via INTRAVENOUS

## 2020-01-21 MED ORDER — DOBUTAMINE IN D5W 4-5 MG/ML-% IV SOLN: 2.5000 ug/kg/min | INTRAVENOUS | Status: DC

## 2020-01-21 NOTE — Progress Notes (Signed)
  Echocardiogram Echocardiogram Pharmacologic Stress Test has been performed.  Darlina Sicilian M 01/21/2020, 1:13 PM

## 2020-01-25 ENCOUNTER — Encounter (HOSPITAL_COMMUNITY)
Admission: RE | Admit: 2020-01-25 | Discharge: 2020-01-25 | Disposition: A | Discharge status: Home / Self Care | Payer: Medicare Other | Source: Ambulatory Visit | Attending: Cardiology | Admitting: Cardiology

## 2020-01-25 ENCOUNTER — Other Ambulatory Visit: Payer: Self-pay

## 2020-01-25 ENCOUNTER — Encounter: Payer: Self-pay | Admitting: Urology

## 2020-01-25 DIAGNOSIS — I5022 Chronic systolic (congestive) heart failure: Secondary | ICD-10-CM | POA: Diagnosis not present

## 2020-01-25 MED ORDER — TECHNETIUM TC 99M PYROPHOSPHATE
21.0000 | Freq: Once | INTRAVENOUS | Status: AC | PRN
  Administered 2020-01-25: 21 via INTRAVENOUS
  Filled 2020-01-25: qty 21

## 2020-01-28 ENCOUNTER — Other Ambulatory Visit (HOSPITAL_COMMUNITY): Payer: Self-pay

## 2020-01-28 MED ORDER — CARVEDILOL 3.125 MG PO TABS: 3.1250 mg | ORAL_TABLET | Freq: Two times a day (BID) | ORAL | 6 refills | Status: DC

## 2020-01-28 MED ORDER — APIXABAN 5 MG PO TABS: 5.0000 mg | ORAL_TABLET | Freq: Two times a day (BID) | ORAL | 6 refills | Status: DC

## 2020-01-28 NOTE — Telephone Encounter (Signed)
LM TO CB TO SCHEDULE APP  MICHELLE

## 2020-01-28 NOTE — Telephone Encounter (Signed)
I arranged a telephone conversation with his daughter [name] for 01-30-20 @ 12:45 to discuss results per our conversation today.   Sharyn Lull

## 2020-01-28 NOTE — Telephone Encounter (Signed)
-----   Message from Abbie Sons, MD sent at 01/20/2020 12:55 PM EST ----- CT and bone scan showed no definite evidence of metastatic disease.  Please schedule telephone visit to discuss management options.

## 2020-01-29 ENCOUNTER — Telehealth: Payer: Self-pay | Admitting: Urology

## 2020-01-29 DIAGNOSIS — K8689 Other specified diseases of pancreas: Secondary | ICD-10-CM

## 2020-01-29 DIAGNOSIS — C61 Malignant neoplasm of prostate: Secondary | ICD-10-CM

## 2020-01-29 NOTE — Telephone Encounter (Signed)
I was scheduled to discuss bone scan and CT results with Mr. Hardt and his daughter Lilla Shook tomorrow at 1245 however had a conflicting meeting and call to reschedule.  His daughter asked that I speak with her then call Mr. Rothermel.  We discussed his CT and bone scan showed no evidence of metastatic disease.  Based on his pathology report and PSA his NCCN risk stratification is very high risk.  He would have a life expectancy of 5 years or less and treatment options would be ADT, EBRT or radiation.  He had an incidental indeterminate pancreatic mass on CT which was suspicious for malignancy.  An MRI abdomen with and without contrast was recommended however he had a pacemaker placed in 2012 for sick sinus syndrome..  We will need to check with radiology if he is able to have an MRI.  I have recommended a medical oncology referral for discussion of treatment options of his prostate cancer and further evaluation of his pancreatic mass.  His PSA was 310 and he has very high risk disease making the likelihood of micrometastatic disease.  Will defer to oncology regarding the value of an Axumin PET scan though unlikely to change treatment as his daughter would prefer he proceed with ADT.  I then contacted Mr. Redhead and discussed with him.

## 2020-01-30 ENCOUNTER — Telehealth: Payer: Medicare Other | Admitting: Urology

## 2020-01-31 NOTE — Telephone Encounter (Signed)
-----   Message from Abbie Sons, MD sent at 01/29/2020  2:44 PM EST ----- Regarding: MRI Pancreatic mass incidentally found on CT.  Radiology recommended an MRI.  He had a pacemaker placed in 2012.  Can you check with MRI to see if he is a candidate to have a scan done?  Thanks

## 2020-01-31 NOTE — Telephone Encounter (Signed)
Spoke with patient and he did not have any information, so I called Dr. Saralyn Pilar office because he is the one that put the pacemaker in 08-11-2011, they will contact me back with the information if they can find it. Per MRI they can not do it here at [facility] at all he will need to go to [facility], and if we can't get the information he can not have it done at all.  Sharyn Lull

## 2020-02-02 DIAGNOSIS — K8689 Other specified diseases of pancreas: Secondary | ICD-10-CM | POA: Insufficient documentation

## 2020-02-02 DIAGNOSIS — C61 Malignant neoplasm of prostate: Secondary | ICD-10-CM | POA: Insufficient documentation

## 2020-02-02 NOTE — Progress Notes (Signed)
[place]  [phone] [phone]  ID: Beverely Low OB: [birth year]  MR#: [id number]  [id number]  Patient Care Team: Tracie Harrier, MD as PCP - General (Internal Medicine) Larey Dresser, MD as Consulting Physician (Cardiology)  CHIEF COMPLAINT: Pancreatic mass, prostate cancer.  INTERVAL HISTORY: Patient is an 86 year old male who was recently diagnosed with prostate cancer.  His PSA was 310, but metastatic work-up with CT scan and nuclear med bone scan was unrevealing other than this is his pancreatic mass.  He is referred to clinic for further evaluation and consideration of Lupron.  He currently feels well and is asymptomatic.  He has no neurologic complaints.  He denies any recent fevers or illnesses.  He has a good appetite and denies weight loss.  He does not complain of pain.  He has no chest pain, shortness of breath, cough, or hemoptysis.  He denies any nausea, vomiting, constipation, or diarrhea.  He has no urinary complaints.  Patient offers no specific complaints today.    REVIEW OF SYSTEMS:   Review of Systems  Constitutional: Negative.  Negative for fever, malaise/fatigue and weight loss.  Respiratory: Negative.  Negative for cough, hemoptysis and shortness of breath.   Cardiovascular: Negative.  Negative for chest pain and leg swelling.  Gastrointestinal: Negative.  Negative for abdominal pain.  Genitourinary: Negative.  Negative for dysuria.  Musculoskeletal: Negative.  Negative for back pain.  Skin: Negative.  Negative for rash.  Neurological: Negative.  Negative for dizziness, focal weakness, weakness and headaches.  Psychiatric/Behavioral: Negative.  The patient is not nervous/anxious.     As per HPI. Otherwise, a complete review of systems is negative.  PAST MEDICAL HISTORY: Past Medical History:  Diagnosis Date  . Anginal pain ([place])   . Aortic stenosis 07/03/2014   Overview:  Mild with calculated aortic valve  area of 1.05cm2  . Basal cell carcinoma, ear 03/31/2015  . Bradycardia 07/03/2014  . CHF (congestive heart failure) ([place])   . DDD (degenerative disc disease), lumbar 08/06/2015  . H/O cardiac catheterization 07/03/2014   Overview:  Cypher stent proximal and distal RCA 07/18/05 and TAXUS stent mid LAD 07/20/05 at [facility]  . HTN (hypertension) 07/03/2014  . Hyperlipidemia 07/03/2014  . Lumbosacral radiculopathy at S1 08/22/2015  . MI (myocardial infarction) ([place]) 07/03/2014   Overview:  Mi 07/17/05  . Myocardial infarction ([place])   . Normocytic normochromic anemia 09/14/2015  . Pacemaker 07/03/2014   Overview:  Dual chamber pacemaker 08/11/11  . Shortness of breath dyspnea   . SOB (shortness of breath) on exertion 05/13/2016    PAST SURGICAL HISTORY: Past Surgical History:  Procedure Laterality Date  . CARDIAC CATHETERIZATION Bilateral 07/14/2016   Procedure: Right/Left Heart Cath and Coronary Angiography;  Surgeon: Isaias Cowman, MD;  Location: [place] [facility];  Service: Cardiovascular;  Laterality: Bilateral;  . CORONARY ANGIOPLASTY    . HERNIA REPAIR    . LEFT HEART CATH AND CORONARY ANGIOGRAPHY N/A 05/09/2019   Procedure: LEFT HEART CATH AND CORONARY ANGIOGRAPHY;  Surgeon: Corey Skains, MD;  Location: [place] [facility];  Service: Cardiovascular;  Laterality: N/A;  . PROSTATE BIOPSY N/A 12/11/2019   Procedure: PROSTATE BIOPSY;  Surgeon: Abbie Sons, MD;  Location: [facility];  Service: Urology;  Laterality: N/A;  . RIGHT HEART CATH N/A 05/17/2019   Procedure: RIGHT HEART CATH;  Surgeon: Larey Dresser, MD;  Location: [place] [facility];  Service: Cardiovascular;  Laterality: N/A;  . TRANSRECTAL ULTRASOUND N/A 12/11/2019  Procedure: TRANSRECTAL ULTRASOUND;  Surgeon: Abbie Sons, MD;  Location: [facility];  Service: Urology;  Laterality: N/A;    FAMILY HISTORY: Family History  Problem Relation Age of Onset  . Bladder Cancer Neg Hx   . Kidney cancer Neg Hx   .  Prostate cancer Neg Hx     ADVANCED DIRECTIVES (Y/N):  N  HEALTH MAINTENANCE: Social History   Tobacco Use  . Smoking status: Former Smoker    Years: 10.00    Types: Cigarettes    Quit date: 1972    Years since quitting: 49.1  . Smokeless tobacco: Former Systems developer    Types: Chew  Substance Use Topics  . Alcohol use: No  . Drug use: No     Colonoscopy:  PAP:  Bone density:  Lipid panel:  No Known Allergies  Current Outpatient Medications  Medication Sig Dispense Refill  . acetaminophen (TYLENOL) 500 MG tablet Take 1,000 mg by mouth every 6 (six) hours as needed for mild pain or moderate pain.     Marland Kitchen apixaban (ELIQUIS) 5 MG TABS tablet Take 1 tablet (5 mg total) by mouth 2 (two) times daily. 60 tablet 6  . atorvastatin (LIPITOR) 80 MG tablet Take 80 mg by mouth daily at 6 PM.     . carvedilol (COREG) 3.125 MG tablet Take 1 tablet (3.125 mg total) by mouth 2 (two) times daily with a meal. 60 tablet 6  . furosemide (LASIX) 20 MG tablet Take 1 tablet (20 mg total) by mouth as needed (weight. Take one tablet if weight increases by 3 pounds overnight or 5 pounds in one week.). (Patient taking differently: Take 20 mg by mouth as needed for edema (weight. Take one tablet if weight increases by 3 pounds overnight or 5 pounds in one week.). ) 30 tablet 0  . ranolazine (RANEXA) 500 MG 12 hr tablet Take 500 mg by mouth 2 (two) times daily.    Marland Kitchen spironolactone (ALDACTONE) 25 MG tablet Take 1 tablet (25 mg total) by mouth at bedtime. 90 tablet 3  . venlafaxine XR (EFFEXOR-XR) 75 MG 24 hr capsule Take 1 capsule (75 mg total) by mouth daily with breakfast. 30 capsule 0  . meclizine (ANTIVERT) 25 MG tablet Take 1 tablet (25 mg total) by mouth 3 (three) times daily as needed for dizziness. (Patient not taking: Reported on 02/04/2020) 30 tablet 0   No current facility-administered medications for this visit.    OBJECTIVE: Vitals:   02/04/20 1141  BP: 114/80  Pulse: 65  Resp: 18  Temp: (!) 96 F  (35.6 C)  SpO2: 94%     Body mass index is 25.43 kg/m.    ECOG FS:0 - Asymptomatic  General: Well-developed, well-nourished, no acute distress. Eyes: Pink conjunctiva, anicteric sclera. HEENT: Normocephalic, moist mucous membranes. Lungs: No audible wheezing or coughing. Heart: Regular rate and rhythm. Abdomen: Soft, nontender, no obvious distention. Musculoskeletal: No edema, cyanosis, or clubbing. Neuro: Alert, answering all questions appropriately. Cranial nerves grossly intact. Skin: No rashes or petechiae noted. Psych: Normal affect. Lymphatics: No cervical, calvicular, axillary or inguinal LAD.   LAB RESULTS:  Lab Results  Component Value Date   NA 138 02/04/2020   K 4.4 02/04/2020   CL 100 02/04/2020   CO2 28 02/04/2020   GLUCOSE 93 02/04/2020   BUN 28 (H) 02/04/2020   CREATININE 1.54 (H) 02/04/2020   CALCIUM 9.1 02/04/2020   PROT 7.5 02/04/2020   ALBUMIN 3.8 02/04/2020   AST 22 02/04/2020  ALT 19 02/04/2020   ALKPHOS 108 02/04/2020   BILITOT 0.7 02/04/2020   GFRNONAA 41 (L) 02/04/2020   GFRAA 47 (L) 02/04/2020    Lab Results  Component Value Date   WBC 6.2 02/04/2020   NEUTROABS 4.3 02/04/2020   HGB 13.6 02/04/2020   HCT 42.6 02/04/2020   MCV 95.1 02/04/2020   PLT 137 (L) 02/04/2020     STUDIES: NM Bone Scan Whole Body  Result Date: 01/09/2020 CLINICAL DATA:  New diagnosis of prostate cancer. EXAM: NUCLEAR MEDICINE WHOLE BODY BONE SCAN TECHNIQUE: Whole body anterior and posterior images were obtained approximately 3 hours after intravenous injection of radiopharmaceutical. RADIOPHARMACEUTICALS:  23.83 mCi Technetium-52m MDP IV COMPARISON:  CT scan of the abdomen and pelvis dated 01/09/2020 and 10/05/2011 and 10/08/2016 and CT angiogram of the chest dated 12/27/2018 FINDINGS: There is a small focal area of increased activity in the medial aspect of the right femoral head. There is also increased activity in the left side of L2. There is also diffuse  increased activity in the thoracic spine. Slight increased activity in the region of the left coracoid process. Review of the prior CT scans demonstrates that the activity at L2 is felt to be due to left anterolateral osteophytes. The increased activity at the right femoral head is due to a chronic benign sclerotic lesion of no significance. The increased activity in the thoracic spine is due to fusion of the thoracic spine from T3 through T12-L1. The activity at the left coracoid process is indeterminate. I do not see any abnormalities in that area on the chest x-ray of 12/11/2019. IMPRESSION: Bone scan demonstrates no definitive evidence of metastatic prostate cancer. The areas of increased activity in the spine and right hip are due to benign abnormalities described above. The activity at the left scapula is indeterminate but most likely degenerative in origin. Electronically Signed   By: Lorriane Shire M.D.   On: 01/09/2020 15:27   NM CARDIAC AMYLOID TUMOR LOC INFLAM SPECT 1 DAY  Result Date: 01/28/2020 CLINICAL DATA:  HEART FAILURE. CONCERN FOR CARDIAC AMYLOIDOSIS. EXAM: NUCLEAR MEDICINE TUMOR LOCALIZATION. PYP CARDIAC AMYLOIDOSIS SCAN WITH SPECT TECHNIQUE: Following intravenous administration of radiopharmaceutical, anterior planar images of the chest were obtained. Regions of interest were placed on the heart and contralateral chest wall for quantitative assessment. Additional SPECT imaging of the chest was obtained. RADIOPHARMACEUTICALS:  Twenty-one mCi TECHNETIUM 99 PYROPHOSPHATE FINDINGS: Planar Visual assessment: Anterior planar imaging demonstrates radiotracer uptake within the heart less than than uptake within the adjacent ribs (Grade 1). Quantitative assessment : Quantitative assessment of the cardiac uptake compared to the contralateral chest wall is equal to 1.1 (H/CL = 1.1). SPECT assessment: SPECT imaging of the chest demonstrates minimal radiotracer accumulation within the LEFT ventricle.  IMPRESSION: Visual and quantitative assessment (grade 1, H/CL equal 1.1) are not suggestive of transthyretin amyloidosis. Electronically Signed   By: Suzy Bouchard M.D.   On: 01/28/2020 08:15   CT Abdomen Pelvis W Contrast  Addendum Date: 01/09/2020   ADDENDUM REPORT: 01/09/2020 09:58 ADDENDUM: As mentioned in the body of the report there is a 2.1 x 1.9 x 2.0 cm hypovascular lesion in the head of the pancreas. This is an incompletely characterized on today's examination, but is suspicious for neoplasm. Further evaluation with abdominal MRI with and without IV gadolinium with MRCP is recommended in the near future to better characterize this finding. These results will be called to the ordering clinician or representative by the Radiologist Assistant, and communication documented  in the PACS or zVision Dashboard. Electronically Signed   By: Vinnie Langton M.D.   On: 01/09/2020 09:58   Result Date: 01/09/2020 CLINICAL DATA:  86 year old male with history of prostate cancer. Staging examination. EXAM: CT ABDOMEN AND PELVIS WITH CONTRAST TECHNIQUE: Multidetector CT imaging of the abdomen and pelvis was performed using the standard protocol following bolus administration of intravenous contrast. CONTRAST:  46mL OMNIPAQUE IOHEXOL 300 MG/ML  SOLN COMPARISON:  CT of the abdomen and pelvis 10/05/2011. FINDINGS: Lower chest: Mild scarring in the lung bases. Severe calcifications of the aortic valve. Atherosclerotic calcifications in the descending thoracic aorta as well as the right coronary artery. Pacemaker leads in the right atrium and right ventricle. Hepatobiliary: No suspicious cystic or solid hepatic lesions. No intra or extrahepatic biliary ductal dilatation. Calcified gallstones lying dependently in the gallbladder. No findings to suggest an acute cholecystitis at this time. Pancreas: In the head of the pancreas (axial image 30 of series 2 and coronal image 34 of series 5) there is a 2.1 x 1.9 x 2.0 cm  hypovascular lesion. Multiple coarse calcifications are noted elsewhere throughout the head, body and tail of the pancreas. No pancreatic ductal dilatation. No peripancreatic fluid collections or inflammatory changes. Spleen: Unremarkable. Adrenals/Urinary Tract: Multiple low-attenuation lesions in both kidneys, compatible with simple cysts, largest of which is in the posterior aspect of the upper pole the left kidney measuring 4 cm. Several subcentimeter low-attenuation lesions in both kidneys, too small to characterize, but statistically likely to represent cysts. In the interpolar region of the right kidney there is a low-attenuation lesion which has a small amount of peripheral calcification in the wall (axial image 36 of series 2) measuring 2.7 cm, compatible with a Bosniak class 2 cyst. No definite suspicious renal lesions. No hydroureteronephrosis. Bilateral adrenal glands are normal in appearance. Urinary bladder is normal in appearance. Stomach/Bowel: Normal appearance of the stomach. No pathologic dilatation of small bowel or colon. The appendix is not confidently identified and may be surgically absent. Regardless, there are no inflammatory changes noted adjacent to the cecum to suggest the presence of an acute appendicitis at this time. Vascular/Lymphatic: Aortic atherosclerosis, without evidence of aneurysm or dissection in the abdominal or pelvic vasculature. No lymphadenopathy noted in the abdomen or pelvis. Reproductive: Prostate gland is enlarged and heterogeneous in appearance with multiple areas of internal coarse calcifications. Seminal vesicles are unremarkable in appearance. Other: No significant volume of ascites.  No pneumoperitoneum. Musculoskeletal: There are no aggressive appearing lytic or blastic lesions noted in the visualized portions of the skeleton. IMPRESSION: 1. No definite evidence of metastatic disease in the abdomen or pelvis. 2. Cholelithiasis without evidence of acute  cholecystitis at this time. 3. Aortic atherosclerosis, in addition to at least right coronary artery disease. 4. There are calcifications of the aortic valve. Echocardiographic correlation for evaluation of potential valvular dysfunction may be warranted if clinically indicated. 5. Additional incidental findings, as above. Electronically Signed: By: Vinnie Langton M.D. On: 01/09/2020 09:35   ECHOCARDIOGRAM STRESS TEST  Result Date: 01/21/2020   ECHOCARDIOGRAM REPORT   Patient Name:   JAHZIAH SICLARI Date of Exam: 01/21/2020 Medical Rec #:  [id number]    Height:       72.0 in Accession #:    [id number]   Weight:       184.0 lb Date of Birth:  04-19-1932    BSA:          2.06 m Patient Age:    67 years  BP:           125/71 mmHg Patient Gender: M            HR:           65 bpm. Exam Location:  Outpatient Procedure: Dobutamine Stress Indications:    Aortic Stenosis  History:        Patient has prior history of Echocardiogram examinations, most                 recent 10/15/2019. CAD and Previous Myocardial Infarction,                 Pacemaker, COPD and Stroke; Risk Factors:Hypertension and                 Dyslipidemia. Ischemic Cardiomyoapthy. Chronic kidney disease.  Sonographer:    Darlina Sicilian RDCS Referring Phys: [place] Comments: Dobutamine stress test for low flow stenosis IMPRESSIONS  1. Left ventricular ejection fraction, by visual estimation, is 25 to 30%. The left ventricle has moderate to severely decreased function. There is no left ventricular hypertrophy.  2. Moderately dilated left ventricular internal cavity size.  3. The left ventricle demonstrates regional wall motion abnormalities.  4. Diffuse hypokinesis with mid and inferior wall akinesis.  5. Global right ventricle has mildly reduced systolic function.The right ventricular size is mildly enlarged. No increase in right ventricular wall thickness.  6. Left atrial size was moderately dilated.  7. Right atrial size was  normal.  8. Mild mitral annular calcification.  9. The mitral valve is degenerative. Mild mitral valve regurgitation. 10. The tricuspid valve is not well visualized. 11. The tricuspid valve is not well visualized. Tricuspid valve regurgitation Not interogated. 12. The aortic valve is tricuspid. Aortic valve regurgitation is trivial. Moderate to severe aortic valve stenosis. 13. Dobutamine echo done from 5-20 ug/kg/min Resting gradient was mean 13 mmHg peak 25 mmHg with DVI 0.31 and AVA 1.1 cm2 Highest gradients had high dose dobutamine showed peak velocity of 4.2 m/sec mean gradient 34 mmHg peak 72 mmHg with DVI 0.28 and AVA of 1 cm2 Overall findings consistent with moderate to severe aortic stenosis. 14. Pulmonic regurgitation is mild. 15. The pulmonic valve was not well visualized. Pulmonic valve regurgitation is mild. 16. The aortic root was not well visualized. 17. The interatrial septum was not assessed. FINDINGS  Left Ventricle: Left ventricular ejection fraction, by visual estimation, is 25 to 30%. The left ventricle has moderate to severely decreased function. The left ventricle demonstrates regional wall motion abnormalities. The left ventricular internal cavity size was moderately dilated left ventricle. There is no left ventricular hypertrophy. Diffuse hypokinesis with mid and inferior wall akinesis. Right Ventricle: The right ventricular size is mildly enlarged. No increase in right ventricular wall thickness. Global RV systolic function is has mildly reduced systolic function. Left Atrium: Left atrial size was moderately dilated. Right Atrium: Right atrial size was normal in size Pericardium: There is no evidence of pericardial effusion. Mitral Valve: The mitral valve is degenerative in appearance. There is mild thickening of the mitral valve leaflet(s). There is mild calcification of the mitral valve leaflet(s). Mild mitral annular calcification. Mild mitral valve regurgitation. Tricuspid Valve: The  tricuspid valve is not well visualized. Tricuspid valve regurgitation Not interogated. Aortic Valve: The aortic valve is tricuspid. Aortic valve regurgitation is trivial. Moderate to severe aortic stenosis is present. Aortic valve mean gradient measures 20.0 mmHg. Aortic valve peak gradient measures 39.7 mmHg. Aortic valve area,  by VTI measures 1.03 cm. Dobutamine echo done from 5-20 ug/kg/min Resting gradient was mean 13 mmHg peak 25 mmHg with DVI 0.31 and AVA 1.1 cm2 Highest gradients had high dose dobutamine showed peak velocity of 4.2 m/sec mean gradient 34 mmHg peak 72 mmHg with DVI 0.28 and AVA of 1 cm2 Overall findings consistent with moderate to severe aortic stenosis. Pulmonic Valve: The pulmonic valve was not well visualized. Pulmonic valve regurgitation is mild. Pulmonic regurgitation is mild. Aorta: The aortic root was not well visualized. IAS/Shunts: The interatrial septum was not assessed.  LEFT VENTRICLE PLAX 2D LVOT diam:     2.15 cm LVOT Area:     3.63 cm  AORTIC VALVE AV Area (Vmax):    0.95 cm AV Area (Vmean):   0.97 cm AV Area (VTI):     1.03 cm AV Vmax:           315.00 cm/s AV Vmean:          210.000 cm/s AV VTI:            0.633 m AV Peak Grad:      39.7 mmHg AV Mean Grad:      20.0 mmHg LVOT Vmax:         82.60 cm/s LVOT Vmean:        56.300 cm/s LVOT VTI:          0.179 m LVOT/AV VTI ratio: 0.28  SHUNTS Systemic VTI:  0.18 m Systemic Diam: 2.15 cm  Jenkins Rouge MD Electronically signed by Jenkins Rouge MD Signature Date/Time: 01/21/2020/1:39:54 PM    Final     ASSESSMENT: Pancreatic mass, prostate cancer.  PLAN:    1.  Prostate cancer: Pathology revealed multiple cores of Gleason 3+4 and 4+4 adenocarcinoma.  His PSA was 310, but no obvious metastatic disease was noted on CT scan or nuclear med bone scan.  Today's PSA was reported at only 192.  Although Axumin PET scan to assess for occult metastatic prostate cancer was discussed, have ordered a regular PET scan as this will likely  give more information about his pancreatic mass.  Given patient's advanced age, would not recommend chemotherapy but he may tolerate Zytiga or Xtandi if necessary in the future.  A referral has been given for radiation oncology for local control of disease.  Agree with ADT treatment.  Patient will return to clinic in 2 weeks to discuss the results of his PET scan and initiation of Eligard. 2.  Pancreatic mass: CA 19-9 is elevated at 96 given concern for underlying malignancy.  PET scan as above.  Patient reports he has had MRIs in the past despite his pacemaker placement, but there are none documented in his medical history at [facility] health.  He did have an MRI in May 2008 at [facility], but this was prior to his pacemaker insertion.  I spent a total of 60 minutes reviewing chart data, face-to-face evaluation with the patient, counseling and coordination of care as detailed above.   Patient expressed understanding and was in agreement with this plan. He also understands that He can call clinic at any time with any questions, concerns, or complaints.   Cancer Staging No matching staging information was found for the patient.  Lloyd Huger, MD   02/05/2020 6:34 AM

## 2020-02-03 ENCOUNTER — Encounter: Payer: Self-pay | Admitting: Urology

## 2020-02-04 ENCOUNTER — Inpatient Hospital Stay: Payer: Medicare Other | Attending: Oncology | Admitting: Oncology

## 2020-02-04 ENCOUNTER — Inpatient Hospital Stay: Payer: Medicare Other

## 2020-02-04 ENCOUNTER — Other Ambulatory Visit: Payer: Self-pay

## 2020-02-04 ENCOUNTER — Encounter: Payer: Self-pay | Admitting: Oncology

## 2020-02-04 DIAGNOSIS — I252 Old myocardial infarction: Secondary | ICD-10-CM | POA: Diagnosis not present

## 2020-02-04 DIAGNOSIS — R1900 Intra-abdominal and pelvic swelling, mass and lump, unspecified site: Secondary | ICD-10-CM | POA: Diagnosis not present

## 2020-02-04 DIAGNOSIS — Z79899 Other long term (current) drug therapy: Secondary | ICD-10-CM | POA: Insufficient documentation

## 2020-02-04 DIAGNOSIS — Z7901 Long term (current) use of anticoagulants: Secondary | ICD-10-CM | POA: Insufficient documentation

## 2020-02-04 DIAGNOSIS — I082 Rheumatic disorders of both aortic and tricuspid valves: Secondary | ICD-10-CM | POA: Insufficient documentation

## 2020-02-04 DIAGNOSIS — I130 Hypertensive heart and chronic kidney disease with heart failure and stage 1 through stage 4 chronic kidney disease, or unspecified chronic kidney disease: Secondary | ICD-10-CM | POA: Diagnosis not present

## 2020-02-04 DIAGNOSIS — Z87891 Personal history of nicotine dependence: Secondary | ICD-10-CM | POA: Insufficient documentation

## 2020-02-04 DIAGNOSIS — C61 Malignant neoplasm of prostate: Secondary | ICD-10-CM

## 2020-02-04 DIAGNOSIS — K8689 Other specified diseases of pancreas: Secondary | ICD-10-CM

## 2020-02-04 DIAGNOSIS — I509 Heart failure, unspecified: Secondary | ICD-10-CM | POA: Insufficient documentation

## 2020-02-04 DIAGNOSIS — I352 Nonrheumatic aortic (valve) stenosis with insufficiency: Secondary | ICD-10-CM | POA: Insufficient documentation

## 2020-02-04 DIAGNOSIS — Z85828 Personal history of other malignant neoplasm of skin: Secondary | ICD-10-CM | POA: Diagnosis not present

## 2020-02-04 DIAGNOSIS — K869 Disease of pancreas, unspecified: Secondary | ICD-10-CM | POA: Diagnosis not present

## 2020-02-04 DIAGNOSIS — E785 Hyperlipidemia, unspecified: Secondary | ICD-10-CM | POA: Insufficient documentation

## 2020-02-04 LAB — CBC WITH DIFFERENTIAL/PLATELET
Abs Immature Granulocytes: 0.03 10*3/uL (ref 0.00–0.07)
Basophils Absolute: 0 10*3/uL (ref 0.0–0.1)
Basophils Relative: 0 %
Eosinophils Absolute: 0.2 10*3/uL (ref 0.0–0.5)
Eosinophils Relative: 3 %
HCT: 42.6 % (ref 39.0–52.0)
Hemoglobin: 13.6 g/dL (ref 13.0–17.0)
Immature Granulocytes: 1 %
Lymphocytes Relative: 15 %
Lymphs Abs: 1 10*3/uL (ref 0.7–4.0)
MCH: 30.4 pg (ref 26.0–34.0)
MCHC: 31.9 g/dL (ref 30.0–36.0)
MCV: 95.1 fL (ref 80.0–100.0)
Monocytes Absolute: 0.7 10*3/uL (ref 0.1–1.0)
Monocytes Relative: 12 %
Neutro Abs: 4.3 10*3/uL (ref 1.7–7.7)
Neutrophils Relative %: 69 %
Platelets: 137 10*3/uL — ABNORMAL LOW (ref 150–400)
RBC: 4.48 MIL/uL (ref 4.22–5.81)
RDW: 13.2 % (ref 11.5–15.5)
WBC: 6.2 10*3/uL (ref 4.0–10.5)
nRBC: 0 % (ref 0.0–0.2)

## 2020-02-04 LAB — COMPREHENSIVE METABOLIC PANEL
ALT: 19 U/L (ref 0–44)
AST: 22 U/L (ref 15–41)
Albumin: 3.8 g/dL (ref 3.5–5.0)
Alkaline Phosphatase: 108 U/L (ref 38–126)
Anion gap: 10 (ref 5–15)
BUN: 28 mg/dL — ABNORMAL HIGH (ref 8–23)
CO2: 28 mmol/L (ref 22–32)
Calcium: 9.1 mg/dL (ref 8.9–10.3)
Chloride: 100 mmol/L (ref 98–111)
Creatinine, Ser: 1.54 mg/dL — ABNORMAL HIGH (ref 0.61–1.24)
GFR calc Af Amer: 47 mL/min — ABNORMAL LOW (ref >60)
GFR calc non Af Amer: 41 mL/min — ABNORMAL LOW (ref >60)
Glucose, Bld: 93 mg/dL (ref 70–99)
Potassium: 4.4 mmol/L (ref 3.5–5.1)
Sodium: 138 mmol/L (ref 135–145)
Total Bilirubin: 0.7 mg/dL (ref 0.3–1.2)
Total Protein: 7.5 g/dL (ref 6.5–8.1)

## 2020-02-04 LAB — PSA: Prostatic Specific Antigen: 192 ng/mL — ABNORMAL HIGH (ref 0.00–4.00)

## 2020-02-04 NOTE — Progress Notes (Signed)
Patient is here for New Patient appointment he is doing well no major complaints

## 2020-02-05 ENCOUNTER — Ambulatory Visit (HOSPITAL_COMMUNITY)
Admission: RE | Admit: 2020-02-05 | Discharge: 2020-02-05 | Disposition: A | Discharge status: Home / Self Care | Payer: Medicare Other | Source: Ambulatory Visit | Attending: Cardiology | Admitting: Cardiology

## 2020-02-05 ENCOUNTER — Encounter (HOSPITAL_COMMUNITY): Payer: Self-pay | Admitting: Cardiology

## 2020-02-05 VITALS — BP 112/66 | HR 85 | Wt 186.7 lb

## 2020-02-05 DIAGNOSIS — K869 Disease of pancreas, unspecified: Secondary | ICD-10-CM | POA: Insufficient documentation

## 2020-02-05 DIAGNOSIS — Z79899 Other long term (current) drug therapy: Secondary | ICD-10-CM | POA: Diagnosis not present

## 2020-02-05 DIAGNOSIS — Z7901 Long term (current) use of anticoagulants: Secondary | ICD-10-CM | POA: Insufficient documentation

## 2020-02-05 DIAGNOSIS — F329 Major depressive disorder, single episode, unspecified: Secondary | ICD-10-CM | POA: Diagnosis not present

## 2020-02-05 DIAGNOSIS — J449 Chronic obstructive pulmonary disease, unspecified: Secondary | ICD-10-CM | POA: Diagnosis not present

## 2020-02-05 DIAGNOSIS — Z87891 Personal history of nicotine dependence: Secondary | ICD-10-CM | POA: Insufficient documentation

## 2020-02-05 DIAGNOSIS — E785 Hyperlipidemia, unspecified: Secondary | ICD-10-CM | POA: Diagnosis not present

## 2020-02-05 DIAGNOSIS — I255 Ischemic cardiomyopathy: Secondary | ICD-10-CM | POA: Insufficient documentation

## 2020-02-05 DIAGNOSIS — I130 Hypertensive heart and chronic kidney disease with heart failure and stage 1 through stage 4 chronic kidney disease, or unspecified chronic kidney disease: Secondary | ICD-10-CM | POA: Insufficient documentation

## 2020-02-05 DIAGNOSIS — Z8546 Personal history of malignant neoplasm of prostate: Secondary | ICD-10-CM | POA: Insufficient documentation

## 2020-02-05 DIAGNOSIS — I350 Nonrheumatic aortic (valve) stenosis: Secondary | ICD-10-CM

## 2020-02-05 DIAGNOSIS — I5022 Chronic systolic (congestive) heart failure: Secondary | ICD-10-CM | POA: Insufficient documentation

## 2020-02-05 DIAGNOSIS — Z8673 Personal history of transient ischemic attack (TIA), and cerebral infarction without residual deficits: Secondary | ICD-10-CM | POA: Diagnosis not present

## 2020-02-05 DIAGNOSIS — I2510 Atherosclerotic heart disease of native coronary artery without angina pectoris: Secondary | ICD-10-CM | POA: Diagnosis not present

## 2020-02-05 DIAGNOSIS — I495 Sick sinus syndrome: Secondary | ICD-10-CM | POA: Diagnosis not present

## 2020-02-05 DIAGNOSIS — N183 Chronic kidney disease, stage 3 unspecified: Secondary | ICD-10-CM | POA: Insufficient documentation

## 2020-02-05 DIAGNOSIS — Z950 Presence of cardiac pacemaker: Secondary | ICD-10-CM | POA: Diagnosis not present

## 2020-02-05 DIAGNOSIS — I252 Old myocardial infarction: Secondary | ICD-10-CM | POA: Insufficient documentation

## 2020-02-05 LAB — CANCER ANTIGEN 19-9: CA 19-9: 96 U/mL — ABNORMAL HIGH (ref 0–35)

## 2020-02-05 MED ORDER — CARVEDILOL 6.25 MG PO TABS: 3.1250 mg | ORAL_TABLET | Freq: Two times a day (BID) | ORAL | 5 refills | Status: DC

## 2020-02-05 MED ORDER — IPRATROPIUM-ALBUTEROL 0.5-2.5 (3) MG/3ML IN SOLN: 3.0000 mL | Freq: Four times a day (QID) | RESPIRATORY_TRACT | 1 refills | Status: DC | PRN

## 2020-02-05 MED ORDER — CARVEDILOL 6.25 MG PO TABS: 6.2500 mg | ORAL_TABLET | Freq: Two times a day (BID) | ORAL | 5 refills | Status: DC

## 2020-02-05 NOTE — Patient Instructions (Addendum)
INCREASE Coreg to 6.25mg  1 tab twice a day   A script for duoneb has been called into your pharmacy   Your Medtronic device is not MRI compatible   You were referred to EP at [facility] to be followed for your device.  They will call you to schedule this appointment.    Your physician has requested that you have an echocardiogram in July. Radiology will call you to schedule this appointment.  Echocardiography is a painless test that uses sound waves to create images of your heart. It provides your doctor with information about the size and shape of your heart and how well your heart's chambers and valves are working. This procedure takes approximately one hour. There are no restrictions for this procedure.    Your physician recommends that you schedule a follow-up appointment.  You are scheduled for Wednesday, May 19th, 2021 at 12noon.  Garage code for May is [id number]   Please call office at [phone] option [phone] if you have any questions or concerns.    At the [facility], you and your health needs are our priority. As part of our continuing mission to provide you with exceptional heart care, we have created designated Provider Care Teams. These Care Teams include your primary Cardiologist (physician) and Advanced Practice Providers (APPs- Physician Assistants and Nurse Practitioners) who all work together to provide you with the care you need, when you need it.   You may see any of the following providers on your designated Care Team at your next follow up: Marland Kitchen Dr Glori Bickers . Dr Loralie Champagne . Darrick Grinder, NP . Lyda Jester, PA . Audry Riles, PharmD   Please be sure to bring in all your medications bottles to every appointment.

## 2020-02-06 NOTE — Progress Notes (Signed)
PCP: Tracie Harrier, MD HF Cardiology: Dr. Aundra Dubin  86 y.o. with history of CAD s/p multiple PCIs, ischemic cardiomyopathy, sick sinus syndrome s/p PPM, and CKD stage 3 returns for followup of CHF and lightheadedness.   Patient has a long history of CAD detailed in the [place] below.  He also has an ischemic cardiomyopathy, with echo in 1/20 showing EF 40-45%. He had a Medtronic pacemaker placed for bradycardia in 2012. He was admitted with somewhat atypical chest pain in 5/20, TnI was mildly elevated at 0.08, with serial troponins there was no trend (maximal 0.09).  He underwent left heart cath, showing stable disease with no intervention planned (chronic occlusion of mid LCx and 85% ostial PDA).  His echo showed that EF was lower than prior at 20-25%.  He was thought to be volume overloaded.  He was diuresed with IV Lasix, and Coreg + Entresto were started.  Patient's BP fell significantly with initiation of cardiac meds and he had orthostatic symptoms.  All his cardiac meds had to be stopped.  There was concern that he may have low output HF and may require inotropic support, so he was transferred to [facility].  RHC at [facility] showed low filling pressures and normal cardiac output.  He continued to have dizziness and also cerebellar signs.  He was seen by neurology and suspected to have a cerebellar CVA.  CTA head showed multiple old small vessel infarcts, no MRI done due to PPM.  Eliquis was started based on neurology recommendation given low EF and CVA.  He gradually improved and was discharged home.   Echo was done in 10/20, ordered by neurology.  EF remained 20-25%, but aortic stenosis was reported as severe. I had not seen this echo prior but reviewed today. Calculated AVA < 1 but highest mean gradient obtained was 16 mmHg.  Based on appearance of valve, this certainly could be low gradient severe AS but valve not seen well.    Low dose DSE was done in 1/21.  This was suggestive of moderate AS  approaching severe.   He returns today for followup of CHF.   No dyspnea with ADLs, notes dyspnea if he walks fast. No chest pain.  No orthopnea/PND.  Being seen by oncology for prostate cancer and also a pancreatic mass, ?cancer.   Labs (6/20): K 5.1 => 4.2, creatinine 1.46 => 1.33, LDL 75 Labs (12/20): K 4.2, creatinine 1.27 Labs (1/21):  Myeloma panel negative, urine IFXN negative, LDL 49 Labs (2/21): K 4.4, creatinine 1.54, hgb 13.6  Past Medical History: 1. Low back pain/degenerative disc disease.  2. Sick sinus syndrome: Dual chamber Medtronic PPM placed 8/12.  3. Aortic stenosis: Mild to moderate on 5/20 echo.  - Echo in 10/20 with ?low gradient severe AS with mean gradient only 15 mmHg but calculated AVA 0.78 cm^2.  - Low dose DSE in 1/21: Moderate AS approaching severe. Rest mean gradient 13 with DVI 0.31 and AVA 1.1 cm^2 => peak stress mean gradient 34 mmHg with DVI 0.28 and AVA 1.0 cm^2.  4. CAD: Extensive history.  - 7/06: Inferior MI with Cypher DES to prox and distal RCA.  - 07/20/05: Taxus DES to mLAD.  - 1/11: Xience DES to OM1 - 4/15: Overlapping Xience DES x 2 to ostial and proximal RCA.  - LHC (5/20): Nonobstructive disease in LAD, occluded mid LCx (chronic), 85% ostial PDA.  No intervention.  5. Chronic systolic CHF: Ischemic cardiomyopathy.   - Echo (1/20) with EF 40-45%.  -  Echo (5/20) with EF 20-25%, normal RV, mild-moderate MR, mild-moderate AS with mean gradient 11 mmHg and AVA 1.47 cm^2.  - RHC (5/20): mean RA 3, PA 38/8 mean 18, mean PCWP 6, CI 2.68.  - Echo (10/20): EF 20-25%, possible low gradient severe AS.  - PYP scan negative in 2/21.  6. CKD: Stage 3.  7. COPD: Remote smoking. 8. HTN 9. Hyperlipidemia 10. Depression 11. CVA: Suspected cerebellar CVA in 5/20.  Eliquis started.  - CTA head (unable to do MRI with PPM): <50% carotid stenosis bilaterally.  Multiple old small vessel infarcts.  12. Prostate Cancer 13. Pancreatic mass: Elevated CA 19-9.    Social History   Socioeconomic History  . Marital status: Widowed    Spouse name: Not on file  . Number of children: 1  . Years of education: Not on file  . Highest education level: Not on file  Occupational History  . Occupation: retired  Tobacco Use  . Smoking status: Former Smoker    Years: 10.00    Types: Cigarettes    Quit date: 1972    Years since quitting: 49.1  . Smokeless tobacco: Former Systems developer    Types: Chew  Substance and Sexual Activity  . Alcohol use: No  . Drug use: No  . Sexual activity: Not on file  Other Topics Concern  . Not on file  Social History Narrative  . Not on file   Social Determinants of Health   Financial Resource Strain: Low Risk   . Difficulty of Paying Living Expenses: Not very hard  Food Insecurity: No Food Insecurity  . Worried About Charity fundraiser in the Last Year: Never true  . Ran Out of Food in the Last Year: Never true  Transportation Needs: No Transportation Needs  . Lack of Transportation (Medical): No  . Lack of Transportation (Non-Medical): No  Physical Activity:   . Days of Exercise per Week: Not on file  . Minutes of Exercise per Session: Not on file  Stress: No Stress Concern Present  . Feeling of Stress : Only a little  Social Connections: Unknown  . Frequency of Communication with Friends and Family: More than three times a week  . Frequency of Social Gatherings with Friends and Family: Not on file  . Attends Religious Services: Not on file  . Active Member of Clubs or Organizations: Not on file  . Attends Archivist Meetings: Not on file  . Marital Status: Widowed  Intimate Partner Violence: Unknown  . Fear of Current or Ex-Partner: No  . Emotionally Abused: No  . Physically Abused: Not on file  . Sexually Abused: Not on file   Family History  Problem Relation Age of Onset  . Bladder Cancer Neg Hx   . Kidney cancer Neg Hx   . Prostate cancer Neg Hx    ROS: All systems reviewed and negative  except as per HPI.   Current Outpatient Medications  Medication Sig Dispense Refill  . acetaminophen (TYLENOL) 500 MG tablet Take 1,000 mg by mouth every 6 (six) hours as needed for mild pain or moderate pain.     Marland Kitchen apixaban (ELIQUIS) 5 MG TABS tablet Take 1 tablet (5 mg total) by mouth 2 (two) times daily. 60 tablet 6  . atorvastatin (LIPITOR) 80 MG tablet Take 80 mg by mouth daily at 6 PM.     . carvedilol (COREG) 6.25 MG tablet Take 1 tablet (6.25 mg total) by mouth 2 (two) times daily with  a meal. 60 tablet 5  . furosemide (LASIX) 20 MG tablet Take 1 tablet (20 mg total) by mouth as needed (weight. Take one tablet if weight increases by 3 pounds overnight or 5 pounds in one week.). (Patient taking differently: Take 20 mg by mouth as needed for edema (weight. Take one tablet if weight increases by 3 pounds overnight or 5 pounds in one week.). ) 30 tablet 0  . meclizine (ANTIVERT) 25 MG tablet Take 1 tablet (25 mg total) by mouth 3 (three) times daily as needed for dizziness. 30 tablet 0  . ranolazine (RANEXA) 500 MG 12 hr tablet Take 500 mg by mouth 2 (two) times daily.    Marland Kitchen spironolactone (ALDACTONE) 25 MG tablet Take 1 tablet (25 mg total) by mouth at bedtime. 90 tablet 3  . venlafaxine XR (EFFEXOR-XR) 75 MG 24 hr capsule Take 1 capsule (75 mg total) by mouth daily with breakfast. 30 capsule 0  . ipratropium-albuterol (DUONEB) 0.5-2.5 (3) MG/3ML SOLN Take 3 mLs by nebulization every 6 (six) hours as needed. 360 mL 1   No current facility-administered medications for this encounter.     BP 112/66   Pulse 85   Wt 84.7 kg (186 lb 11.2 oz)   SpO2 95%   BMI 25.32 kg/m  General: NAD Neck: No JVD, no thyromegaly or thyroid nodule.  Lungs: Distant BS CV: Nondisplaced PMI.  Heart distant sounds, regular S1/S2, no S3/S4, 2/6 SEM RUSB.  No peripheral edema.  No carotid bruit.  Normal pedal pulses.  Abdomen: Soft, nontender, no hepatosplenomegaly, no distention.  Skin: Intact without lesions  or rashes.  Neurologic: Alert and oriented x 3.  Psych: Normal affect. Extremities: No clubbing or cyanosis.  HEENT: Normal.    Assessment/Plan: 1. Chronic systolic CHF: Ischemic cardiomyopathy. Echo in 1/20 with EF 40-45%, but now down to 20-25% on echo from 5/20. The cause of the fall in EF is uncertain, he had coronary angiogram at [facility] in 5/20 with stable anatomy (no intervention).  He was been pacing his RV only 5% of the time when device was interrogated.  RHC in 5/20 showed low filling pressures and good cardiac output.  Echo in 10/20 showed stable EF 20-25% range.  BP has tended to run low with orthostatic hypotension when cardiac meds are added but doing well on current regimen. No evidence for amyloidosis with negative myeloma panel and urine IFXN as well as PYP scan negative.  NYHA class II symptoms currently, feels good.  Not volume overloaded.  - Increase Coreg to 6.25 mg bid.     - He does not need a diuretic.  - Would hold off on ACEI/ARB/ARNI given orthostasis/soft BP.  - Continue spironolactone 25 daily 2. CAD: Multiple interventions in the past.  Cath in 5/20 showed stable anatomy, no intervention. No chest pain.  - He was started on Eliquis per neurology due to CVA with low EF, now off ASA and Plavix.   - Continue atorvastatin 80 mg daily, good lipids in 1/21.   - Continue ranolazine.  - If severe AS confirmed, he will need RHC/LHC prior to AVR.  3. CKD: Stage 3. Recent BMET stable.  4. Aortic stenosis: Mild to moderate on echo in 5/20.  Echo from 10/20 was reviewed today (ordered by neurology and I had not seen before).  The valve is not well visualized by TTE but certainly appearance could be consistent with severe AS.  AVA 0.78 cm^2 with mean gradient only 15 mmHg.  ?Low  gradient severe AS.  Low dose DSE was done, this was suggestive of moderate but nearing severe AS.  - I will repeat an echo at 6 months in 7/21.  If there is any worsening, he will need TAVR.   5.  Suspected cerebellar CVA: In 5/20.  Dizziness/?vertigo with dysmetria and diplopia on exam.  Seen by neurology and felt to have cerebellar stroke, repeat CT head with no acute changes (old small vessel infarcts). Carotid u/s 1-39% bilaterally. Vertebrals ok.  No MRI due to PPM.  Had CTA head/neck without large vessel occlusion or stenosis.  - He was started on apixaban and stopped ASA/Plavix with low EF.   6. Sick sinus syndrome: MDT dual chamber pacemaker, RV pacing appears rare.  - Arrange pacemaker followup.   7. Pancreatic mass: Elevated Ca 19-9.  He will be getting a PET scan.   Followup in 3 months.   Loralie Champagne 02/06/2020

## 2020-02-12 ENCOUNTER — Encounter: Payer: Self-pay | Admitting: Radiation Oncology

## 2020-02-12 ENCOUNTER — Other Ambulatory Visit: Payer: Self-pay | Admitting: Oncology

## 2020-02-13 ENCOUNTER — Encounter: Payer: Self-pay | Admitting: Radiation Oncology

## 2020-02-13 ENCOUNTER — Other Ambulatory Visit: Payer: Self-pay

## 2020-02-13 ENCOUNTER — Ambulatory Visit
Admission: RE | Admit: 2020-02-13 | Discharge: 2020-02-13 | Disposition: A | Discharge status: Home / Self Care | Payer: Medicare Other | Source: Ambulatory Visit | Attending: Radiation Oncology | Admitting: Radiation Oncology

## 2020-02-13 DIAGNOSIS — I509 Heart failure, unspecified: Secondary | ICD-10-CM | POA: Diagnosis not present

## 2020-02-13 DIAGNOSIS — C61 Malignant neoplasm of prostate: Secondary | ICD-10-CM | POA: Diagnosis present

## 2020-02-13 DIAGNOSIS — I252 Old myocardial infarction: Secondary | ICD-10-CM | POA: Diagnosis not present

## 2020-02-13 DIAGNOSIS — E785 Hyperlipidemia, unspecified: Secondary | ICD-10-CM | POA: Diagnosis not present

## 2020-02-13 DIAGNOSIS — Z8546 Personal history of malignant neoplasm of prostate: Secondary | ICD-10-CM | POA: Insufficient documentation

## 2020-02-13 DIAGNOSIS — Z87891 Personal history of nicotine dependence: Secondary | ICD-10-CM | POA: Insufficient documentation

## 2020-02-13 DIAGNOSIS — I350 Nonrheumatic aortic (valve) stenosis: Secondary | ICD-10-CM | POA: Diagnosis not present

## 2020-02-13 DIAGNOSIS — Z79899 Other long term (current) drug therapy: Secondary | ICD-10-CM | POA: Insufficient documentation

## 2020-02-13 DIAGNOSIS — I110 Hypertensive heart disease with heart failure: Secondary | ICD-10-CM | POA: Insufficient documentation

## 2020-02-13 DIAGNOSIS — Z85828 Personal history of other malignant neoplasm of skin: Secondary | ICD-10-CM | POA: Insufficient documentation

## 2020-02-13 DIAGNOSIS — Z7901 Long term (current) use of anticoagulants: Secondary | ICD-10-CM | POA: Diagnosis not present

## 2020-02-13 DIAGNOSIS — R351 Nocturia: Secondary | ICD-10-CM | POA: Diagnosis not present

## 2020-02-13 NOTE — Consult Note (Signed)
NEW PATIENT EVALUATION  Name: Kristopher Fernandez  MRN: [id number]  Date:   02/13/2020     DOB: 11/17/1932   This 86 y.o. male patient presents to the clinic for initial evaluation of at least stage IIb (T1 cN0 M0) Gleason 8 (4+4) presenting with a PSA most recently 63 adenocarcinoma the prostate.  REFERRING PHYSICIAN: Tracie Harrier, MD  CHIEF COMPLAINT:  Chief Complaint  Patient presents with  . Prostate Cancer    DIAGNOSIS: The encounter diagnosis was Prostate cancer ([place]).   PREVIOUS INVESTIGATIONS:  Bone scan and CT scans reviewed PET CT scan pending Pathology report reviewed Clinical notes reviewed  HPI: Patient is an 86 year old male with known history of prostate cancer.  I previously treated his wife for metastatic breast cancer.  He presented with an elevated PSA in at 310.  Work-up including transrectal ultrasound-guided biopsy showed 7 of 12 cores positive for adenocarcinoma a combination of Gleason 8 (4+4) and Gleason 7 (3+4).  Work-up including bone scan which showed no evidence to suggest definitive metastatic disease although there was some areas of increased activity in spine and right hip with abnormalities to account for that.  CT scan of abdomen pelvis also showed no definitive evidence of malignancy.  There was a lesion measuring 2.1 x 1.9 cm hypovascular in the head of the pancreas with no pancreatic ductal dilatation.  Patient is being started on an androgen deprivation therapy.  He is without bone pain.  He has nocturia x3 no specific urgency or frequency.  I believe asked to evaluate him for prostate cancer.  He has a PET scan pending.  PLANNED TREATMENT REGIMEN: Possible IMRT radiation therapy to prostate and pelvic nodes  PAST MEDICAL HISTORY:  has a past medical history of Anginal pain ([place]), Aortic stenosis (07/03/2014), Basal cell carcinoma, ear (03/31/2015), Bradycardia (07/03/2014), CHF (congestive heart failure) ([place]), DDD (degenerative disc disease), lumbar  (08/06/2015), H/O cardiac catheterization (07/03/2014), HTN (hypertension) (07/03/2014), Hyperlipidemia (07/03/2014), Lumbosacral radiculopathy at S1 (08/22/2015), MI (myocardial infarction) ([place]) (07/03/2014), Myocardial infarction ([place]), Normocytic normochromic anemia (09/14/2015), Pacemaker (07/03/2014), Shortness of breath dyspnea, and SOB (shortness of breath) on exertion (05/13/2016).    PAST SURGICAL HISTORY:  Past Surgical History:  Procedure Laterality Date  . CARDIAC CATHETERIZATION Bilateral 07/14/2016   Procedure: Right/Left Heart Cath and Coronary Angiography;  Surgeon: Isaias Cowman, MD;  Location: [place] [facility];  Service: Cardiovascular;  Laterality: Bilateral;  . CORONARY ANGIOPLASTY    . HERNIA REPAIR    . LEFT HEART CATH AND CORONARY ANGIOGRAPHY N/A 05/09/2019   Procedure: LEFT HEART CATH AND CORONARY ANGIOGRAPHY;  Surgeon: Corey Skains, MD;  Location: [place] [facility];  Service: Cardiovascular;  Laterality: N/A;  . PROSTATE BIOPSY N/A 12/11/2019   Procedure: PROSTATE BIOPSY;  Surgeon: Abbie Sons, MD;  Location: [facility];  Service: Urology;  Laterality: N/A;  . RIGHT HEART CATH N/A 05/17/2019   Procedure: RIGHT HEART CATH;  Surgeon: Larey Dresser, MD;  Location: [place] [facility];  Service: Cardiovascular;  Laterality: N/A;  . TRANSRECTAL ULTRASOUND N/A 12/11/2019   Procedure: TRANSRECTAL ULTRASOUND;  Surgeon: Abbie Sons, MD;  Location: [facility];  Service: Urology;  Laterality: N/A;    FAMILY HISTORY: family history is not on file.  SOCIAL HISTORY:  reports that he quit smoking about 49 years ago. His smoking use included cigarettes. He quit after 10.00 years of use. He has quit using smokeless tobacco.  His smokeless tobacco use included chew. He reports that he does  not drink alcohol or use drugs.  ALLERGIES: Patient has no known allergies.  MEDICATIONS:  Current Outpatient Medications  Medication Sig Dispense Refill  . acetaminophen (TYLENOL)  500 MG tablet Take 1,000 mg by mouth every 6 (six) hours as needed for mild pain or moderate pain.     Marland Kitchen apixaban (ELIQUIS) 5 MG TABS tablet Take 1 tablet (5 mg total) by mouth 2 (two) times daily. 60 tablet 6  . atorvastatin (LIPITOR) 80 MG tablet Take 80 mg by mouth daily at 6 PM.     . carvedilol (COREG) 6.25 MG tablet Take 1 tablet (6.25 mg total) by mouth 2 (two) times daily with a meal. 60 tablet 5  . furosemide (LASIX) 20 MG tablet Take 1 tablet (20 mg total) by mouth as needed (weight. Take one tablet if weight increases by 3 pounds overnight or 5 pounds in one week.). (Patient taking differently: Take 20 mg by mouth as needed for edema (weight. Take one tablet if weight increases by 3 pounds overnight or 5 pounds in one week.). ) 30 tablet 0  . ipratropium-albuterol (DUONEB) 0.5-2.5 (3) MG/3ML SOLN Take 3 mLs by nebulization every 6 (six) hours as needed. 360 mL 1  . meclizine (ANTIVERT) 25 MG tablet Take 1 tablet (25 mg total) by mouth 3 (three) times daily as needed for dizziness. 30 tablet 0  . ranolazine (RANEXA) 500 MG 12 hr tablet Take 500 mg by mouth 2 (two) times daily.    Marland Kitchen spironolactone (ALDACTONE) 25 MG tablet Take 1 tablet (25 mg total) by mouth at bedtime. 90 tablet 3  . venlafaxine XR (EFFEXOR-XR) 75 MG 24 hr capsule Take 1 capsule (75 mg total) by mouth daily with breakfast. 30 capsule 0   No current facility-administered medications for this encounter.    ECOG PERFORMANCE STATUS:  0 - Asymptomatic  REVIEW OF SYSTEMS: Patient denies any weight loss, fatigue, weakness, fever, chills or night sweats. Patient denies any loss of vision, blurred vision. Patient denies any ringing  of the ears or hearing loss. No irregular heartbeat. Patient denies heart murmur or history of fainting. Patient denies any chest pain or pain radiating to her upper extremities. Patient denies any shortness of breath, difficulty breathing at night, cough or hemoptysis. Patient denies any swelling in  the lower legs. Patient denies any nausea vomiting, vomiting of blood, or coffee ground material in the vomitus. Patient denies any stomach pain. Patient states has had normal bowel movements no significant constipation or diarrhea. Patient denies any dysuria, hematuria or significant nocturia. Patient denies any problems walking, swelling in the joints or loss of balance. Patient denies any skin changes, loss of hair or loss of weight. Patient denies any excessive worrying or anxiety or significant depression. Patient denies any problems with insomnia. Patient denies excessive thirst, polyuria, polydipsia. Patient denies any swollen glands, patient denies easy bruising or easy bleeding. Patient denies any recent infections, allergies or URI. Patient "s visual fields have not changed significantly in recent time.   PHYSICAL EXAM: There were no vitals taken for this visit. Well-developed well-nourished patient in NAD. HEENT reveals PERLA, EOMI, discs not visualized.  Oral cavity is clear. No oral mucosal lesions are identified. Neck is clear without evidence of cervical or supraclavicular adenopathy. Lungs are clear to A&P. Cardiac examination is essentially unremarkable with regular rate and rhythm without murmur rub or thrill. Abdomen is benign with no organomegaly or masses noted. Motor sensory and DTR levels are equal and symmetric in the  upper and lower extremities. Cranial nerves II through XII are grossly intact. Proprioception is intact. No peripheral adenopathy or edema is identified. No motor or sensory levels are noted. Crude visual fields are within normal range.  LABORATORY DATA: Pathology report reviewed    RADIOLOGY RESULTS: Bone scan and abdomen pelvic CT scan reviewed PET CT scan pending   IMPRESSION: [place] stage IIb adenocarcinoma of the prostate Gleason 8 (4+4) presenting with an elevated PSA in the 200 range in an 86 year old male  PLAN: At this time we are pending his PET CT scan to  determine whether there is a possibility of adenocarcinoma the pancreas.  If that would be the case would not offer treatment to his prostate.  Should the PET scan be declined or shows no evidence of malignancy in the pancreas would offer radiation therapy to his prostate and pelvic lymph nodes.  Also would treat with androgen deprivation therapy for at least 2 years under medical oncology's direction.  Risks and benefits of prostate radiation including increased lower urinary tract symptoms diarrhea fatigue alteration of blood count skin Reaction all were discussed in detail.  I have tentatively set up a simulation appointment in about 2 weeks.  Hopefully will have PET CT scan to review before then.  Certainly may not be able to totally alleviate his prostate cancer although certainly with his age can slow down the clinical course and prevent metastatic disease possibly appearing in the future.  Patient and daughter both comprehend my treatment plan well.  I would like to take this opportunity to thank you for allowing me to participate in the care of your patient.Noreene Filbert, MD

## 2020-02-16 NOTE — Progress Notes (Signed)
[place]  [phone] [phone]  ID: Kristopher Fernandez OB: [birth year]  MR#: [id number]  [id number]  Patient Care Team: Tracie Harrier, MD as PCP - General (Internal Medicine) Larey Dresser, MD as Consulting Physician (Cardiology)  CHIEF COMPLAINT: Pancreatic mass, prostate cancer.  INTERVAL HISTORY: Patient returns to clinic today for further evaluation, discussion of his PET scan results, and initiation of Eligard.  He continues to feel well and remains asymptomatic. He has no neurologic complaints.  He denies any recent fevers or illnesses.  He has a good appetite and denies weight loss.  He does not complain of pain.  He has no chest pain, shortness of breath, cough, or hemoptysis.  He denies any nausea, vomiting, constipation, or diarrhea.  He has no urinary complaints.  Patient offers no specific complaints today.  REVIEW OF SYSTEMS:   Review of Systems  Constitutional: Negative.  Negative for fever, malaise/fatigue and weight loss.  Respiratory: Negative.  Negative for cough, hemoptysis and shortness of breath.   Cardiovascular: Negative.  Negative for chest pain and leg swelling.  Gastrointestinal: Negative.  Negative for abdominal pain.  Genitourinary: Negative.  Negative for dysuria.  Musculoskeletal: Negative.  Negative for back pain.  Skin: Negative.  Negative for rash.  Neurological: Negative.  Negative for dizziness, focal weakness, weakness and headaches.  Psychiatric/Behavioral: Negative.  The patient is not nervous/anxious.     As per HPI. Otherwise, a complete review of systems is negative.  PAST MEDICAL HISTORY: Past Medical History:  Diagnosis Date  . Anginal pain ([place])   . Aortic stenosis 07/03/2014   Overview:  Mild with calculated aortic valve area of 1.05cm2  . Basal cell carcinoma, ear 03/31/2015  . Bradycardia 07/03/2014  . CHF (congestive heart failure) ([place])   . DDD (degenerative disc disease), lumbar  08/06/2015  . H/O cardiac catheterization 07/03/2014   Overview:  Cypher stent proximal and distal RCA 07/18/05 and TAXUS stent mid LAD 07/20/05 at [facility]  . HTN (hypertension) 07/03/2014  . Hyperlipidemia 07/03/2014  . Lumbosacral radiculopathy at S1 08/22/2015  . MI (myocardial infarction) ([place]) 07/03/2014   Overview:  Mi 07/17/05  . Myocardial infarction ([place])   . Normocytic normochromic anemia 09/14/2015  . Pacemaker 07/03/2014   Overview:  Dual chamber pacemaker 08/11/11  . Shortness of breath dyspnea   . SOB (shortness of breath) on exertion 05/13/2016    PAST SURGICAL HISTORY: Past Surgical History:  Procedure Laterality Date  . CARDIAC CATHETERIZATION Bilateral 07/14/2016   Procedure: Right/Left Heart Cath and Coronary Angiography;  Surgeon: Isaias Cowman, MD;  Location: [place] [facility];  Service: Cardiovascular;  Laterality: Bilateral;  . CORONARY ANGIOPLASTY    . HERNIA REPAIR    . LEFT HEART CATH AND CORONARY ANGIOGRAPHY N/A 05/09/2019   Procedure: LEFT HEART CATH AND CORONARY ANGIOGRAPHY;  Surgeon: Corey Skains, MD;  Location: [place] [facility];  Service: Cardiovascular;  Laterality: N/A;  . PROSTATE BIOPSY N/A 12/11/2019   Procedure: PROSTATE BIOPSY;  Surgeon: Abbie Sons, MD;  Location: [facility];  Service: Urology;  Laterality: N/A;  . RIGHT HEART CATH N/A 05/17/2019   Procedure: RIGHT HEART CATH;  Surgeon: Larey Dresser, MD;  Location: [place] [facility];  Service: Cardiovascular;  Laterality: N/A;  . TRANSRECTAL ULTRASOUND N/A 12/11/2019   Procedure: TRANSRECTAL ULTRASOUND;  Surgeon: Abbie Sons, MD;  Location: [facility];  Service: Urology;  Laterality: N/A;    FAMILY HISTORY: Family History  Problem Relation Age of Onset  .  Bladder Cancer Neg Hx   . Kidney cancer Neg Hx   . Prostate cancer Neg Hx     ADVANCED DIRECTIVES (Y/N):  N  HEALTH MAINTENANCE: Social History   Tobacco Use  . Smoking status: Former Smoker    Years: 10.00     Types: Cigarettes    Quit date: 1972    Years since quitting: 49.2  . Smokeless tobacco: Former Systems developer    Types: Chew  Substance Use Topics  . Alcohol use: No  . Drug use: No     Colonoscopy:  PAP:  Bone density:  Lipid panel:  No Known Allergies  Current Outpatient Medications  Medication Sig Dispense Refill  . acetaminophen (TYLENOL) 500 MG tablet Take 1,000 mg by mouth every 6 (six) hours as needed for mild pain or moderate pain.     Marland Kitchen apixaban (ELIQUIS) 5 MG TABS tablet Take 1 tablet (5 mg total) by mouth 2 (two) times daily. 60 tablet 6  . atorvastatin (LIPITOR) 80 MG tablet Take 80 mg by mouth daily at 6 PM.     . carvedilol (COREG) 6.25 MG tablet Take 1 tablet (6.25 mg total) by mouth 2 (two) times daily with a meal. 60 tablet 5  . ipratropium-albuterol (DUONEB) 0.5-2.5 (3) MG/3ML SOLN Take 3 mLs by nebulization every 6 (six) hours as needed. 360 mL 1  . ranolazine (RANEXA) 500 MG 12 hr tablet Take 500 mg by mouth 2 (two) times daily.    Marland Kitchen spironolactone (ALDACTONE) 25 MG tablet Take 1 tablet (25 mg total) by mouth at bedtime. 90 tablet 3  . venlafaxine XR (EFFEXOR-XR) 75 MG 24 hr capsule Take 1 capsule (75 mg total) by mouth daily with breakfast. 30 capsule 0  . furosemide (LASIX) 20 MG tablet Take 1 tablet (20 mg total) by mouth as needed (weight. Take one tablet if weight increases by 3 pounds overnight or 5 pounds in one week.). (Patient not taking: Reported on 02/22/2020) 30 tablet 0  . meclizine (ANTIVERT) 25 MG tablet Take 1 tablet (25 mg total) by mouth 3 (three) times daily as needed for dizziness. (Patient not taking: Reported on 02/22/2020) 30 tablet 0   No current facility-administered medications for this visit.    OBJECTIVE: Vitals:   02/22/20 0927  BP: 114/75  Pulse: 85  Resp: 18  Temp: (!) 96.4 F (35.8 C)  SpO2: 100%     Body mass index is 25.2 kg/m.    ECOG FS:0 - Asymptomatic  General: Well-developed, well-nourished, no acute distress. Eyes: Pink  conjunctiva, anicteric sclera. HEENT: Normocephalic, moist mucous membranes. Lungs: No audible wheezing or coughing. Heart: Regular rate and rhythm. Abdomen: Soft, nontender, no obvious distention. Musculoskeletal: No edema, cyanosis, or clubbing. Neuro: Alert, answering all questions appropriately. Cranial nerves grossly intact. Skin: No rashes or petechiae noted. Psych: Normal affect.  LAB RESULTS:  Lab Results  Component Value Date   NA 138 02/04/2020   K 4.4 02/04/2020   CL 100 02/04/2020   CO2 28 02/04/2020   GLUCOSE 93 02/04/2020   BUN 28 (H) 02/04/2020   CREATININE 1.54 (H) 02/04/2020   CALCIUM 9.1 02/04/2020   PROT 7.5 02/04/2020   ALBUMIN 3.8 02/04/2020   AST 22 02/04/2020   ALT 19 02/04/2020   ALKPHOS 108 02/04/2020   BILITOT 0.7 02/04/2020   GFRNONAA 41 (L) 02/04/2020   GFRAA 47 (L) 02/04/2020    Lab Results  Component Value Date   WBC 6.2 02/04/2020   NEUTROABS 4.3 02/04/2020  HGB 13.6 02/04/2020   HCT 42.6 02/04/2020   MCV 95.1 02/04/2020   PLT 137 (L) 02/04/2020     STUDIES: NM CARDIAC AMYLOID TUMOR LOC INFLAM SPECT 1 DAY  Result Date: 01/28/2020 CLINICAL DATA:  HEART FAILURE. CONCERN FOR CARDIAC AMYLOIDOSIS. EXAM: NUCLEAR MEDICINE TUMOR LOCALIZATION. PYP CARDIAC AMYLOIDOSIS SCAN WITH SPECT TECHNIQUE: Following intravenous administration of radiopharmaceutical, anterior planar images of the chest were obtained. Regions of interest were placed on the heart and contralateral chest wall for quantitative assessment. Additional SPECT imaging of the chest was obtained. RADIOPHARMACEUTICALS:  Twenty-one mCi TECHNETIUM 99 PYROPHOSPHATE FINDINGS: Planar Visual assessment: Anterior planar imaging demonstrates radiotracer uptake within the heart less than than uptake within the adjacent ribs (Grade 1). Quantitative assessment : Quantitative assessment of the cardiac uptake compared to the contralateral chest wall is equal to 1.1 (H/CL = 1.1). SPECT assessment: SPECT  imaging of the chest demonstrates minimal radiotracer accumulation within the LEFT ventricle. IMPRESSION: Visual and quantitative assessment (grade 1, H/CL equal 1.1) are not suggestive of transthyretin amyloidosis. Electronically Signed   By: Suzy Bouchard M.D.   On: 01/28/2020 08:15   NM PET Image Initial (PI) Skull Base To Thigh  Result Date: 02/21/2020 CLINICAL DATA:  Initial treatment strategy for pancreatic carcinoma. Additional history of prostate carcinoma. EXAM: NUCLEAR MEDICINE PET SKULL BASE TO THIGH TECHNIQUE: 9.9 mCi F-18 FDG was injected intravenously. Full-ring PET imaging was performed from the skull base to thigh after the radiotracer. CT data was obtained and used for attenuation correction and anatomic localization. Fasting blood glucose: 108 mg/dl COMPARISON:  CT 01/09/2020 FINDINGS: Mediastinal blood pool activity: SUV max 1.8 Liver activity: SUV max 2.76 NECK: No hypermetabolic lymph nodes in the neck. Incidental CT findings: none CHEST: Hypermetabolic RIGHT hilar lymph node measures less than 10 mm with SUV max equal 4.8. Small hypermetabolic LEFT axillary lymph node with SUV max equal 3.3. Incidental CT findings: Small 2 mm pulmonary nodule in the lingula (image 111/3). ABDOMEN/PELVIS: There is no focal abnormal metabolic activity within the pancreas. Particular attention directed to region of Fernandez-density described on comparison diagnostic CT. No abnormal metabolic activity in the liver. No hypermetabolic upper abdominal lymph nodes. There are two enlarged hypermetabolic LEFT iliac lymph nodes. LEFT external iliac lymph node measuring 17 mm (image 224/3) with SUV max equal 4.7. Second LEFT internal iliac lymph node measuring 10 mm with SUV max equal 4.4. Nonspecific metabolic activity within the prostate gland. Incidental CT findings: Atherosclerotic calcification of the aorta. Gallstones noted. SKELETON: No focal hypermetabolic activity to suggest skeletal metastasis. Incidental CT  findings: Loss of bone density in the thoracic spine and upper lumbar spine. Anterior osteophytosis. Findings most consistent with degenerative change/osteoporosis. No abnormal metabolic activity. IMPRESSION: 1. No metabolic activity in the body the pancreas to correspond to Fernandez-density lesion on comparison diagnostic PET CT scan. No evidence of pancreatic cancer metastasis. 2. Two enlarged hypermetabolic LEFT iliac lymph nodes are favored to PROSTATE CANCER NODAL METASTASIS. 3. Moderate hypermetabolic activity associated with bilateral small hilar lymph nodes are favored reactive. 4. No evidence skeletal metastasis. Electronically Signed   By: Suzy Bouchard M.D.   On: 02/21/2020 13:27    ASSESSMENT: Pancreatic mass, prostate cancer.  PLAN:    1.  Prostate cancer: Pathology revealed multiple cores of Gleason 3+4 and 4+4 adenocarcinoma.  His most recent PSA reported at 192.  PET scan on February 21, 2020 reviewed independently and reported as above with 2 enlarged hypermetabolic left iliac lymph nodes likely nodal metastasis.  Patient had consultation with radiation oncology today and plan is to pursue XRT for local control of disease. Given patient's advanced age, would not recommend chemotherapy but he may tolerate Zytiga or Xtandi if necessary in the proceed with 45 mg Eligard today.  Return to clinic in 3 months for laboratory work and further evaluation. 2.  Pancreatic mass: PET scan results as above with no hypermetabolism in pancreatic lesion.  Although his CA 19-9 is 96, this is less concerning for malignancy.  No intervention is needed at this time.  We will repeat tumor marker in 3 months along with laboratory work.  No further intervention is needed.  Can consider repeat imaging in 6 to 12 months if there is any concern.    I spent a total of 30 minutes reviewing chart data, face-to-face evaluation with the patient, counseling and coordination of care as detailed above.   Patient expressed  understanding and was in agreement with this plan. He also understands that He can call clinic at any time with any questions, concerns, or complaints.   Cancer Staging No matching staging information was found for the patient.  Lloyd Huger, MD   02/23/2020 4:23 PM

## 2020-02-18 ENCOUNTER — Inpatient Hospital Stay: Payer: Medicare Other | Admitting: Oncology

## 2020-02-18 ENCOUNTER — Inpatient Hospital Stay: Payer: Medicare Other

## 2020-02-21 ENCOUNTER — Ambulatory Visit
Admission: RE | Admit: 2020-02-21 | Discharge: 2020-02-21 | Disposition: A | Discharge status: Home / Self Care | Payer: Medicare Other | Source: Ambulatory Visit | Attending: Oncology | Admitting: Oncology

## 2020-02-21 ENCOUNTER — Other Ambulatory Visit: Payer: Self-pay

## 2020-02-21 DIAGNOSIS — K8689 Other specified diseases of pancreas: Secondary | ICD-10-CM

## 2020-02-21 DIAGNOSIS — Z8546 Personal history of malignant neoplasm of prostate: Secondary | ICD-10-CM | POA: Diagnosis not present

## 2020-02-21 DIAGNOSIS — C61 Malignant neoplasm of prostate: Secondary | ICD-10-CM

## 2020-02-21 LAB — GLUCOSE, CAPILLARY: Glucose-Capillary: 108 mg/dL — ABNORMAL HIGH (ref 70–99)

## 2020-02-21 MED ORDER — FLUDEOXYGLUCOSE F - 18 (FDG) INJECTION
9.6000 | Freq: Once | INTRAVENOUS | Status: AC | PRN
  Administered 2020-02-21: 11:00:00 9.89 via INTRAVENOUS

## 2020-02-22 ENCOUNTER — Inpatient Hospital Stay: Payer: Medicare Other | Attending: Oncology | Admitting: Oncology

## 2020-02-22 ENCOUNTER — Encounter: Payer: Self-pay | Admitting: Oncology

## 2020-02-22 ENCOUNTER — Inpatient Hospital Stay: Payer: Medicare Other

## 2020-02-22 ENCOUNTER — Other Ambulatory Visit: Payer: Self-pay

## 2020-02-22 VITALS — BP 114/75 | HR 85 | Temp 96.4°F | Resp 18 | Wt 185.8 lb

## 2020-02-22 DIAGNOSIS — K869 Disease of pancreas, unspecified: Secondary | ICD-10-CM | POA: Insufficient documentation

## 2020-02-22 DIAGNOSIS — I2510 Atherosclerotic heart disease of native coronary artery without angina pectoris: Secondary | ICD-10-CM | POA: Insufficient documentation

## 2020-02-22 DIAGNOSIS — Z7901 Long term (current) use of anticoagulants: Secondary | ICD-10-CM | POA: Insufficient documentation

## 2020-02-22 DIAGNOSIS — M5136 Other intervertebral disc degeneration, lumbar region: Secondary | ICD-10-CM | POA: Insufficient documentation

## 2020-02-22 DIAGNOSIS — R0602 Shortness of breath: Secondary | ICD-10-CM | POA: Diagnosis not present

## 2020-02-22 DIAGNOSIS — I10 Essential (primary) hypertension: Secondary | ICD-10-CM | POA: Insufficient documentation

## 2020-02-22 DIAGNOSIS — Z79899 Other long term (current) drug therapy: Secondary | ICD-10-CM | POA: Diagnosis not present

## 2020-02-22 DIAGNOSIS — C61 Malignant neoplasm of prostate: Secondary | ICD-10-CM | POA: Insufficient documentation

## 2020-02-22 DIAGNOSIS — Z79818 Long term (current) use of other agents affecting estrogen receptors and estrogen levels: Secondary | ICD-10-CM | POA: Diagnosis not present

## 2020-02-22 DIAGNOSIS — Z87891 Personal history of nicotine dependence: Secondary | ICD-10-CM | POA: Diagnosis not present

## 2020-02-22 DIAGNOSIS — I252 Old myocardial infarction: Secondary | ICD-10-CM | POA: Diagnosis not present

## 2020-02-22 DIAGNOSIS — I509 Heart failure, unspecified: Secondary | ICD-10-CM | POA: Insufficient documentation

## 2020-02-22 DIAGNOSIS — E785 Hyperlipidemia, unspecified: Secondary | ICD-10-CM | POA: Insufficient documentation

## 2020-02-22 DIAGNOSIS — Z85820 Personal history of malignant melanoma of skin: Secondary | ICD-10-CM | POA: Insufficient documentation

## 2020-02-22 MED ORDER — LEUPROLIDE ACETATE (6 MONTH) 45 MG ~~LOC~~ KIT
45.0000 mg | PACK | Freq: Once | SUBCUTANEOUS | Status: AC
  Administered 2020-02-22: 45 mg via SUBCUTANEOUS
  Filled 2020-02-22: qty 45

## 2020-02-22 NOTE — Progress Notes (Signed)
Pt in for follow up, denies any difficulties or concerns today. 

## 2020-02-26 ENCOUNTER — Other Ambulatory Visit: Payer: Self-pay

## 2020-02-27 ENCOUNTER — Ambulatory Visit
Admission: RE | Admit: 2020-02-27 | Discharge: 2020-02-27 | Disposition: A | Discharge status: Home / Self Care | Payer: Medicare Other | Source: Ambulatory Visit | Attending: Radiation Oncology | Admitting: Radiation Oncology

## 2020-02-27 DIAGNOSIS — Z510 Encounter for antineoplastic radiation therapy: Secondary | ICD-10-CM | POA: Insufficient documentation

## 2020-02-27 DIAGNOSIS — C61 Malignant neoplasm of prostate: Secondary | ICD-10-CM | POA: Insufficient documentation

## 2020-03-05 ENCOUNTER — Ambulatory Visit: Admission: RE | Admit: 2020-03-05 | Payer: Medicare Other | Source: Ambulatory Visit

## 2020-03-05 DIAGNOSIS — C61 Malignant neoplasm of prostate: Secondary | ICD-10-CM | POA: Diagnosis not present

## 2020-03-06 ENCOUNTER — Ambulatory Visit
Admission: RE | Admit: 2020-03-06 | Discharge: 2020-03-06 | Disposition: A | Discharge status: Home / Self Care | Payer: Medicare Other | Source: Ambulatory Visit | Attending: Radiation Oncology | Admitting: Radiation Oncology

## 2020-03-06 DIAGNOSIS — C61 Malignant neoplasm of prostate: Secondary | ICD-10-CM | POA: Diagnosis not present

## 2020-03-07 ENCOUNTER — Other Ambulatory Visit: Payer: Self-pay | Admitting: *Deleted

## 2020-03-07 ENCOUNTER — Encounter (HOSPITAL_COMMUNITY): Payer: Medicare Other | Admitting: Cardiology

## 2020-03-07 ENCOUNTER — Ambulatory Visit
Admission: RE | Admit: 2020-03-07 | Discharge: 2020-03-07 | Disposition: A | Discharge status: Home / Self Care | Payer: Medicare Other | Source: Ambulatory Visit | Attending: Radiation Oncology | Admitting: Radiation Oncology

## 2020-03-07 DIAGNOSIS — C61 Malignant neoplasm of prostate: Secondary | ICD-10-CM | POA: Diagnosis not present

## 2020-03-10 ENCOUNTER — Ambulatory Visit
Admission: RE | Admit: 2020-03-10 | Discharge: 2020-03-10 | Disposition: A | Discharge status: Home / Self Care | Payer: Medicare Other | Source: Ambulatory Visit | Attending: Radiation Oncology | Admitting: Radiation Oncology

## 2020-03-10 DIAGNOSIS — C61 Malignant neoplasm of prostate: Secondary | ICD-10-CM | POA: Diagnosis not present

## 2020-03-11 ENCOUNTER — Ambulatory Visit
Admission: RE | Admit: 2020-03-11 | Discharge: 2020-03-11 | Disposition: A | Discharge status: Home / Self Care | Payer: Medicare Other | Source: Ambulatory Visit | Attending: Radiation Oncology | Admitting: Radiation Oncology

## 2020-03-11 DIAGNOSIS — C61 Malignant neoplasm of prostate: Secondary | ICD-10-CM | POA: Diagnosis not present

## 2020-03-12 ENCOUNTER — Ambulatory Visit
Admission: RE | Admit: 2020-03-12 | Discharge: 2020-03-12 | Disposition: A | Discharge status: Home / Self Care | Payer: Medicare Other | Source: Ambulatory Visit | Attending: Radiation Oncology | Admitting: Radiation Oncology

## 2020-03-12 DIAGNOSIS — C61 Malignant neoplasm of prostate: Secondary | ICD-10-CM | POA: Diagnosis not present

## 2020-03-13 ENCOUNTER — Ambulatory Visit
Admission: RE | Admit: 2020-03-13 | Discharge: 2020-03-13 | Disposition: A | Discharge status: Home / Self Care | Payer: Medicare Other | Source: Ambulatory Visit | Attending: Radiation Oncology | Admitting: Radiation Oncology

## 2020-03-13 DIAGNOSIS — C61 Malignant neoplasm of prostate: Secondary | ICD-10-CM | POA: Diagnosis not present

## 2020-03-14 ENCOUNTER — Ambulatory Visit
Admission: RE | Admit: 2020-03-14 | Discharge: 2020-03-14 | Disposition: A | Discharge status: Home / Self Care | Payer: Medicare Other | Source: Ambulatory Visit | Attending: Radiation Oncology | Admitting: Radiation Oncology

## 2020-03-14 DIAGNOSIS — C61 Malignant neoplasm of prostate: Secondary | ICD-10-CM | POA: Diagnosis not present

## 2020-03-17 ENCOUNTER — Ambulatory Visit
Admission: RE | Admit: 2020-03-17 | Discharge: 2020-03-17 | Disposition: A | Discharge status: Home / Self Care | Payer: Medicare Other | Source: Ambulatory Visit | Attending: Radiation Oncology | Admitting: Radiation Oncology

## 2020-03-17 DIAGNOSIS — C61 Malignant neoplasm of prostate: Secondary | ICD-10-CM | POA: Diagnosis not present

## 2020-03-18 ENCOUNTER — Ambulatory Visit
Admission: RE | Admit: 2020-03-18 | Discharge: 2020-03-18 | Disposition: A | Discharge status: Home / Self Care | Payer: Medicare Other | Source: Ambulatory Visit | Attending: Radiation Oncology | Admitting: Radiation Oncology

## 2020-03-18 DIAGNOSIS — C61 Malignant neoplasm of prostate: Secondary | ICD-10-CM | POA: Diagnosis not present

## 2020-03-19 ENCOUNTER — Ambulatory Visit
Admission: RE | Admit: 2020-03-19 | Discharge: 2020-03-19 | Disposition: A | Discharge status: Home / Self Care | Payer: Medicare Other | Source: Ambulatory Visit | Attending: Radiation Oncology | Admitting: Radiation Oncology

## 2020-03-19 DIAGNOSIS — C61 Malignant neoplasm of prostate: Secondary | ICD-10-CM | POA: Diagnosis not present

## 2020-03-20 ENCOUNTER — Other Ambulatory Visit: Payer: Self-pay

## 2020-03-20 ENCOUNTER — Inpatient Hospital Stay: Payer: Medicare Other | Attending: Radiation Oncology

## 2020-03-20 ENCOUNTER — Ambulatory Visit
Admission: RE | Admit: 2020-03-20 | Discharge: 2020-03-20 | Disposition: A | Discharge status: Home / Self Care | Payer: Medicare Other | Source: Ambulatory Visit | Attending: Radiation Oncology | Admitting: Radiation Oncology

## 2020-03-20 DIAGNOSIS — C61 Malignant neoplasm of prostate: Secondary | ICD-10-CM | POA: Insufficient documentation

## 2020-03-20 DIAGNOSIS — Z510 Encounter for antineoplastic radiation therapy: Secondary | ICD-10-CM | POA: Insufficient documentation

## 2020-03-20 LAB — CBC
HCT: 39 % (ref 39.0–52.0)
Hemoglobin: 13.3 g/dL (ref 13.0–17.0)
MCH: 30.9 pg (ref 26.0–34.0)
MCHC: 34.1 g/dL (ref 30.0–36.0)
MCV: 90.7 fL (ref 80.0–100.0)
Platelets: 98 10*3/uL — ABNORMAL LOW (ref 150–400)
RBC: 4.3 MIL/uL (ref 4.22–5.81)
RDW: 13 % (ref 11.5–15.5)
WBC: 5 10*3/uL (ref 4.0–10.5)
nRBC: 0 % (ref 0.0–0.2)

## 2020-03-21 ENCOUNTER — Ambulatory Visit
Admission: RE | Admit: 2020-03-21 | Discharge: 2020-03-21 | Disposition: A | Discharge status: Home / Self Care | Payer: Medicare Other | Source: Ambulatory Visit | Attending: Radiation Oncology | Admitting: Radiation Oncology

## 2020-03-21 DIAGNOSIS — C61 Malignant neoplasm of prostate: Secondary | ICD-10-CM | POA: Diagnosis not present

## 2020-03-24 ENCOUNTER — Ambulatory Visit
Admission: RE | Admit: 2020-03-24 | Discharge: 2020-03-24 | Disposition: A | Discharge status: Home / Self Care | Payer: Medicare Other | Source: Ambulatory Visit | Attending: Radiation Oncology | Admitting: Radiation Oncology

## 2020-03-24 DIAGNOSIS — C61 Malignant neoplasm of prostate: Secondary | ICD-10-CM | POA: Diagnosis not present

## 2020-03-25 ENCOUNTER — Ambulatory Visit
Admission: RE | Admit: 2020-03-25 | Discharge: 2020-03-25 | Disposition: A | Discharge status: Home / Self Care | Payer: Medicare Other | Source: Ambulatory Visit | Attending: Radiation Oncology | Admitting: Radiation Oncology

## 2020-03-25 ENCOUNTER — Encounter: Payer: Self-pay | Admitting: Internal Medicine

## 2020-03-25 ENCOUNTER — Other Ambulatory Visit: Payer: Self-pay | Admitting: *Deleted

## 2020-03-25 ENCOUNTER — Other Ambulatory Visit: Payer: Self-pay

## 2020-03-25 ENCOUNTER — Ambulatory Visit (INDEPENDENT_AMBULATORY_CARE_PROVIDER_SITE_OTHER): Payer: Medicare Other | Admitting: Internal Medicine

## 2020-03-25 VITALS — BP 120/60 | HR 72 | Ht 72.0 in | Wt 186.0 lb

## 2020-03-25 DIAGNOSIS — I5022 Chronic systolic (congestive) heart failure: Secondary | ICD-10-CM | POA: Diagnosis not present

## 2020-03-25 DIAGNOSIS — I255 Ischemic cardiomyopathy: Secondary | ICD-10-CM

## 2020-03-25 DIAGNOSIS — C61 Malignant neoplasm of prostate: Secondary | ICD-10-CM | POA: Diagnosis not present

## 2020-03-25 DIAGNOSIS — I495 Sick sinus syndrome: Secondary | ICD-10-CM | POA: Diagnosis not present

## 2020-03-25 DIAGNOSIS — Z950 Presence of cardiac pacemaker: Secondary | ICD-10-CM

## 2020-03-25 MED ORDER — TAMSULOSIN HCL 0.4 MG PO CAPS: 0.4000 mg | ORAL_CAPSULE | Freq: Every day | ORAL | 6 refills | Status: DC

## 2020-03-25 NOTE — Patient Instructions (Signed)

## 2020-03-25 NOTE — Progress Notes (Signed)
ELECTROPHYSIOLOGY CONSULT NOTE  Patient ID: Kristopher Fernandez, MRN: [id number], DOB/AGE: [birth year] 86 y.o. Admit date: (Not on file) Date of Consult: 03/25/2020  Primary Physician: Tracie Harrier, MD Primary Cardiologist:DM      Kristopher Fernandez is a 86 y.o. male who is being seen today for the evaluation of pacemaker at the request of DM.    HPI Kristopher Fernandez is a 86 y.o. male seen to establish pacemaker  follow-up for Medtronic device implanted remotely 2012 for sinus bradycardia  He has a history of ischemic heart disease with depressed LV function; history of multiple PCI's dating back to 2006 and most recently 2015 he has been recently found to have aortic stenosis thought to be moderate-severe as assessed by dobutamine stress echo.  Has a longstanding history of extra beats that have been asymptomatic.  Notably as previously commented.  Played with [facility]  DATE TEST EF   5/20 LHC    % Diffuse disease, previously implanted stents patent  10/20 Echo   20-25 %   2/21 PYP  Neg for TTR amyloid    Date Cr K Hgb  2/21 1.51 4.4<<5.3 13.3         The prostate cancer.  Note reviewed.  Possible radiation therapy.  Interval PET scan demonstrated hypermetabolic left iliac nodes consistent with prostate cancer metastasis; no metabolic activity was noted with the pancreatic mass noted on CT      Past Medical History:  Diagnosis Date  . Anginal pain ([place])   . Aortic stenosis 07/03/2014   Overview:  Mild with calculated aortic valve area of 1.05cm2  . Basal cell carcinoma, ear 03/31/2015  . Bradycardia 07/03/2014  . CHF (congestive heart failure) ([place])   . DDD (degenerative disc disease), lumbar 08/06/2015  . H/O cardiac catheterization 07/03/2014   Overview:  Cypher stent proximal and distal RCA 07/18/05 and TAXUS stent mid LAD 07/20/05 at [facility]  . HTN (hypertension) 07/03/2014  . Hyperlipidemia 07/03/2014  . Lumbosacral radiculopathy at S1 08/22/2015  . MI (myocardial  infarction) ([place]) 07/03/2014   Overview:  Mi 07/17/05  . Myocardial infarction ([place])   . Normocytic normochromic anemia 09/14/2015  . Pacemaker 07/03/2014   Overview:  Dual chamber pacemaker 08/11/11  . Shortness of breath dyspnea   . SOB (shortness of breath) on exertion 05/13/2016      Surgical History:  Past Surgical History:  Procedure Laterality Date  . CARDIAC CATHETERIZATION Bilateral 07/14/2016   Procedure: Right/Left Heart Cath and Coronary Angiography;  Surgeon: Isaias Cowman, MD;  Location: [place] [facility];  Service: Cardiovascular;  Laterality: Bilateral;  . CORONARY ANGIOPLASTY    . HERNIA REPAIR    . LEFT HEART CATH AND CORONARY ANGIOGRAPHY N/A 05/09/2019   Procedure: LEFT HEART CATH AND CORONARY ANGIOGRAPHY;  Surgeon: Corey Skains, MD;  Location: [place] [facility];  Service: Cardiovascular;  Laterality: N/A;  . PROSTATE BIOPSY N/A 12/11/2019   Procedure: PROSTATE BIOPSY;  Surgeon: Abbie Sons, MD;  Location: [facility];  Service: Urology;  Laterality: N/A;  . RIGHT HEART CATH N/A 05/17/2019   Procedure: RIGHT HEART CATH;  Surgeon: Larey Dresser, MD;  Location: [place] [facility];  Service: Cardiovascular;  Laterality: N/A;  . TRANSRECTAL ULTRASOUND N/A 12/11/2019   Procedure: TRANSRECTAL ULTRASOUND;  Surgeon: Abbie Sons, MD;  Location: [facility];  Service: Urology;  Laterality: N/A;     Home Meds: Current Meds  Medication Sig  . acetaminophen (TYLENOL) 500  MG tablet Take 1,000 mg by mouth every 6 (six) hours as needed for mild pain or moderate pain.   Marland Kitchen apixaban (ELIQUIS) 5 MG TABS tablet Take 1 tablet (5 mg total) by mouth 2 (two) times daily.  Marland Kitchen atorvastatin (LIPITOR) 80 MG tablet Take 80 mg by mouth daily at 6 PM.   . carvedilol (COREG) 6.25 MG tablet Take 1 tablet (6.25 mg total) by mouth 2 (two) times daily with a meal.  . furosemide (LASIX) 20 MG tablet Take 1 tablet (20 mg total) by mouth as needed (weight. Take one tablet if weight  increases by 3 pounds overnight or 5 pounds in one week.).  Marland Kitchen ipratropium-albuterol (DUONEB) 0.5-2.5 (3) MG/3ML SOLN Take 3 mLs by nebulization every 6 (six) hours as needed.  . meclizine (ANTIVERT) 25 MG tablet Take 1 tablet (25 mg total) by mouth 3 (three) times daily as needed for dizziness.  . ranolazine (RANEXA) 500 MG 12 hr tablet Take 500 mg by mouth 2 (two) times daily.  Marland Kitchen spironolactone (ALDACTONE) 25 MG tablet Take 1 tablet (25 mg total) by mouth at bedtime.  Marland Kitchen venlafaxine XR (EFFEXOR-XR) 75 MG 24 hr capsule Take 1 capsule (75 mg total) by mouth daily with breakfast.    Allergies: No Known Allergies  Social History   Socioeconomic History  . Marital status: Widowed    Spouse name: Not on file  . Number of children: 1  . Years of education: Not on file  . Highest education level: Not on file  Occupational History  . Occupation: retired  Tobacco Use  . Smoking status: Former Smoker    Years: 10.00    Types: Cigarettes    Quit date: 1972    Years since quitting: 49.2  . Smokeless tobacco: Former Systems developer    Types: Chew  Substance and Sexual Activity  . Alcohol use: No  . Drug use: No  . Sexual activity: Not on file  Other Topics Concern  . Not on file  Social History Narrative  . Not on file   Social Determinants of Health   Financial Resource Strain: Low Risk   . Difficulty of Paying Living Expenses: Not very hard  Food Insecurity: No Food Insecurity  . Worried About Charity fundraiser in the Last Year: Never true  . Ran Out of Food in the Last Year: Never true  Transportation Needs: No Transportation Needs  . Lack of Transportation (Medical): No  . Lack of Transportation (Non-Medical): No  Physical Activity:   . Days of Exercise per Week:   . Minutes of Exercise per Session:   Stress: No Stress Concern Present  . Feeling of Stress : Only a little  Social Connections: Unknown  . Frequency of Communication with Friends and Family: More than three times a week   . Frequency of Social Gatherings with Friends and Family: Not on file  . Attends Religious Services: Not on file  . Active Member of Clubs or Organizations: Not on file  . Attends Archivist Meetings: Not on file  . Marital Status: Widowed  Intimate Partner Violence: Unknown  . Fear of Current or Ex-Partner: No  . Emotionally Abused: No  . Physically Abused: Not on file  . Sexually Abused: Not on file     Family History  Problem Relation Age of Onset  . Bladder Cancer Neg Hx   . Kidney cancer Neg Hx   . Prostate cancer Neg Hx      ROS:  Please see the history of present illness.     All other systems reviewed and negative.    Physical Exam: Blood pressure 120/60, pulse 72, height 6' (1.829 m), weight 186 lb (84.4 kg). General: Well developed, well nourished male in no acute distress. Head: Normocephalic, atraumatic, sclera non-icteric, no xanthomas, nares are without discharge. EENT: normal  Lymph Nodes:  none Neck: Negative for carotid bruits. JVD not elevated. Back:without scoliosis kyphosis  Device pocket well healed; without hematoma or erythema.  There is no tethering  Lungs: Clear bilaterally to auscultation without wheezes, rales, or rhonchi. Breathing is unlabored. Heart: Irregular RR with S1 S2. 3/6 murmur . No rubs, or gallops appreciated. Abdomen: Soft, non-tender, non-distended with normoactive bowel sounds. No hepatomegaly. No rebound/guarding. No obvious abdominal masses. Msk:  Strength and tone appear normal for age. Extremities: No clubbing or cyanosis. No edema.  Distal pedal pulses are 2+ and equal bilaterally. Skin: Warm and Dry Neuro: Alert and oriented X 3. CN III-XII intact Grossly normal sensory and motor function . Psych:  Responds to questions appropriately with a normal affect.      Labs: Cardiac Enzymes No results for input(s): CKTOTAL, CKMB, TROPONINI in the last 72 hours. CBC Lab Results  Component Value Date   WBC 5.0 03/20/2020    HGB 13.3 03/20/2020   HCT 39.0 03/20/2020   MCV 90.7 03/20/2020   PLT 98 (L) 03/20/2020   PROTIME: No results for input(s): LABPROT, INR in the last 72 hours. Chemistry No results for input(s): NA, K, CL, CO2, BUN, CREATININE, CALCIUM, PROT, BILITOT, ALKPHOS, ALT, AST, GLUCOSE in the last 168 hours.  Invalid input(s): LABALBU Lipids Lab Results  Component Value Date   CHOL 123 01/08/2020   HDL 57 01/08/2020   LDLCALC 49 01/08/2020   TRIG 86 01/08/2020   BNP No results found for: PROBNP Thyroid Function Tests: No results for input(s): TSH, T4TOTAL, T3FREE, THYROIDAB in the last 72 hours.  Invalid input(s): FREET3 Miscellaneous No results found for: DDIMER  Radiology/Studies:  No results found.  EKG: Atrial paced at 72 Interval 26/11/42 PVCs left bundle inferior axis-frequent  Multiple ECGs were reviewed back to 2017.  Almost all demonstrated PVCs of various frequencies   Assessment and Plan:  Sinus node dysfunction  Pacemaker-Medtronic DOI 2012  Cardiomyopathy-presumed ischemic multiple revascularization by stenting  PVCs-frequent question contributing to cardiomyopathy  Congestive heart failure-chronic-systolic class IIb-IIIa  Aortic stenosis-moderate-severe  Chronic kidney disease  CVA     Patient device function is normal.  He is chronotropically incompetent and reasonably programmed.  Enhancements could be made for exercise tolerance with decreasing his ADL slope and increasing his exercise slope  He has frequent PVCs.  Potentially contributing to his cardiomyopathy.  There may be a role for antiarrhythmic suppression.  Will review with Dr. DM.  Marland KitchenWithout symptoms of ischemia  Euvolemic continue current meds      Virl Axe

## 2020-03-26 ENCOUNTER — Ambulatory Visit
Admission: RE | Admit: 2020-03-26 | Discharge: 2020-03-26 | Disposition: A | Discharge status: Home / Self Care | Payer: Medicare Other | Source: Ambulatory Visit | Attending: Radiation Oncology | Admitting: Radiation Oncology

## 2020-03-26 DIAGNOSIS — C61 Malignant neoplasm of prostate: Secondary | ICD-10-CM | POA: Diagnosis not present

## 2020-03-27 ENCOUNTER — Ambulatory Visit
Admission: RE | Admit: 2020-03-27 | Discharge: 2020-03-27 | Disposition: A | Discharge status: Home / Self Care | Payer: Medicare Other | Source: Ambulatory Visit | Attending: Radiation Oncology | Admitting: Radiation Oncology

## 2020-03-27 DIAGNOSIS — C61 Malignant neoplasm of prostate: Secondary | ICD-10-CM | POA: Diagnosis not present

## 2020-03-28 ENCOUNTER — Ambulatory Visit
Admission: RE | Admit: 2020-03-28 | Discharge: 2020-03-28 | Disposition: A | Discharge status: Home / Self Care | Payer: Medicare Other | Source: Ambulatory Visit | Attending: Radiation Oncology | Admitting: Radiation Oncology

## 2020-03-28 DIAGNOSIS — C61 Malignant neoplasm of prostate: Secondary | ICD-10-CM | POA: Diagnosis not present

## 2020-03-31 ENCOUNTER — Ambulatory Visit
Admission: RE | Admit: 2020-03-31 | Discharge: 2020-03-31 | Disposition: A | Discharge status: Home / Self Care | Payer: Medicare Other | Source: Ambulatory Visit | Attending: Radiation Oncology | Admitting: Radiation Oncology

## 2020-03-31 DIAGNOSIS — C61 Malignant neoplasm of prostate: Secondary | ICD-10-CM | POA: Diagnosis not present

## 2020-04-01 ENCOUNTER — Ambulatory Visit
Admission: RE | Admit: 2020-04-01 | Discharge: 2020-04-01 | Disposition: A | Discharge status: Home / Self Care | Payer: Medicare Other | Source: Ambulatory Visit | Attending: Radiation Oncology | Admitting: Radiation Oncology

## 2020-04-01 DIAGNOSIS — C61 Malignant neoplasm of prostate: Secondary | ICD-10-CM | POA: Diagnosis not present

## 2020-04-02 ENCOUNTER — Ambulatory Visit
Admission: RE | Admit: 2020-04-02 | Discharge: 2020-04-02 | Disposition: A | Discharge status: Home / Self Care | Payer: Medicare Other | Source: Ambulatory Visit | Attending: Radiation Oncology | Admitting: Radiation Oncology

## 2020-04-03 ENCOUNTER — Other Ambulatory Visit: Payer: Self-pay

## 2020-04-03 ENCOUNTER — Inpatient Hospital Stay: Payer: Medicare Other

## 2020-04-03 ENCOUNTER — Ambulatory Visit
Admission: RE | Admit: 2020-04-03 | Discharge: 2020-04-03 | Disposition: A | Discharge status: Home / Self Care | Payer: Medicare Other | Source: Ambulatory Visit | Attending: Radiation Oncology | Admitting: Radiation Oncology

## 2020-04-03 DIAGNOSIS — C61 Malignant neoplasm of prostate: Secondary | ICD-10-CM | POA: Diagnosis not present

## 2020-04-03 LAB — CBC
HCT: 38.6 % — ABNORMAL LOW (ref 39.0–52.0)
Hemoglobin: 13.2 g/dL (ref 13.0–17.0)
MCH: 30.8 pg (ref 26.0–34.0)
MCHC: 34.2 g/dL (ref 30.0–36.0)
MCV: 90.2 fL (ref 80.0–100.0)
Platelets: 96 10*3/uL — ABNORMAL LOW (ref 150–400)
RBC: 4.28 MIL/uL (ref 4.22–5.81)
RDW: 13.1 % (ref 11.5–15.5)
WBC: 5.3 10*3/uL (ref 4.0–10.5)
nRBC: 0 % (ref 0.0–0.2)

## 2020-04-04 ENCOUNTER — Ambulatory Visit
Admission: RE | Admit: 2020-04-04 | Discharge: 2020-04-04 | Disposition: A | Discharge status: Home / Self Care | Payer: Medicare Other | Source: Ambulatory Visit | Attending: Radiation Oncology | Admitting: Radiation Oncology

## 2020-04-04 DIAGNOSIS — C61 Malignant neoplasm of prostate: Secondary | ICD-10-CM | POA: Diagnosis not present

## 2020-04-07 ENCOUNTER — Ambulatory Visit (INDEPENDENT_AMBULATORY_CARE_PROVIDER_SITE_OTHER): Payer: Medicare Other | Admitting: Adult Health

## 2020-04-07 ENCOUNTER — Encounter: Payer: Self-pay | Admitting: Adult Health

## 2020-04-07 ENCOUNTER — Ambulatory Visit
Admission: RE | Admit: 2020-04-07 | Discharge: 2020-04-07 | Disposition: A | Discharge status: Home / Self Care | Payer: Medicare Other | Source: Ambulatory Visit | Attending: Radiation Oncology | Admitting: Radiation Oncology

## 2020-04-07 ENCOUNTER — Other Ambulatory Visit: Payer: Self-pay

## 2020-04-07 VITALS — BP 117/72 | HR 84 | Temp 97.8°F | Ht 72.0 in | Wt 180.0 lb

## 2020-04-07 DIAGNOSIS — R5383 Other fatigue: Secondary | ICD-10-CM

## 2020-04-07 DIAGNOSIS — G459 Transient cerebral ischemic attack, unspecified: Secondary | ICD-10-CM | POA: Diagnosis not present

## 2020-04-07 DIAGNOSIS — E782 Mixed hyperlipidemia: Secondary | ICD-10-CM | POA: Diagnosis not present

## 2020-04-07 DIAGNOSIS — I10 Essential (primary) hypertension: Secondary | ICD-10-CM

## 2020-04-07 DIAGNOSIS — C61 Malignant neoplasm of prostate: Secondary | ICD-10-CM | POA: Diagnosis not present

## 2020-04-07 NOTE — Progress Notes (Signed)
I agree with the above plan 

## 2020-04-07 NOTE — Progress Notes (Signed)
[facility] [address] [place]. [place] [address] [phone]       OFFICE FOLLOW UP NOTE  [name] Date of Birth:  06-01-1932 Medical Record Number:  [id number]   Referring MD: Rosalin Hawking Reason for Referral: Stroke  Chief complaint: Chief Complaint  Patient presents with  . Follow-up    hx of stroke,rm 9, with daughter      HPI:   Today, 04/07/2020, [name] returns for follow-up regarding prior stroke accompanied by his daughter.  He has been stable from a neurological standpoint without new or reoccurring stroke/TIA symptoms.  Greatest complaint at today's visit is in regards to fatigue.  Repeat 2D echo in 09/2019 showed stable EF 20-25% as well as severe aortic stenosis therefore Eliquis continued.  Continues to follow with cardiology routinely.  Also recently diagnosed with prostate cancer after finding of elevated PSA with PET scan confirming prostate cancer with nodal metastasis and pancreatic mass and currently undergoing radiation daily with hopeful end date 04/30/2020 and routine follow-up with oncology.  Continues on atorvastatin 80 mg daily without side effects.  Blood pressure today 117/72.  No further concerns at this time.    History provided for reference purposes only Update 10/08/2019 Dr. [name] : He returns for follow-up after last visit 3 months ago.  He states he is doing well.  He has had no further episodes of stroke or TIA.  His dizziness and balance have recovered completely back to normal.  He has had no episodes of double vision or incoordination.  He states his blood pressure tends to run on the low side and today it is 106/65.  He has noted improvement in his orthostatic symptoms.  He is tolerating Lipitor 80 mg well without side effects.  He remains on Eliquis as well as is tolerating it well without bleeding but does get bruise easily.  He was supposed to get a follow-up echocardiogram done 3 months after his admission for stroke  but it has not yet been done.  He does have an upcoming appointment to follow-up with Dr. Aundra Dubin his cardiologist.  Initial consult 07/02/2019 Dr. [name] :[name] is a pleasant 86 year old Caucasian male seen today for initial office consultation visit for stroke.  He is accompanied by his daughter today.  History is obtained from them, review of electronic medical records and have personally reviewed imaging films in PACS.  He was admitted to [facility] on 05/17/2019 with shortness of breath and chest pain and found to be in acute heart failure with fluid overload and LV dysfunction.  His ejection fraction had dropped from a previous 40% to 20% during the current admission.  Patient had complained of feeling dizzy and off-balance and his blood pressure been running between systolic [id number].  He started developing double vision as well as having nausea, incoordination left upper extremity.  CT scan of the head did not show definite stroke.  MRI could not be obtained as he had pacemaker.  He was transferred to [facility] and a CT angiogram of the brain and neck were obtained which did not show any large vessel occlusion but 50% carotid bifurcation stenosis which was not confirmed on the carotid ultrasound.  2D echo showed ejection fraction of 20 to 25%.  LDL cholesterol was 75 mg percent and hemoglobin A1c was 6.0.  Patient was recommended Eliquis given the low ejection fraction and is tolerated well so far without bruising or bleeding.  He feels all  his symptoms cleared after a day from transfer to [facility].  He has had no recurrent stroke or TIA symptoms.  His blood pressure still running low and today it is 109/66.  However he feels his dizziness is much improved.  His double vision and dysmetria have not recurred.  He is quite independent and manages all activities of daily living by himself.  He has learned that if he gets up slowly and avoid sudden movements he is not as  dizzy.  He has now not had previous strokes, TIAs seizures or any other significant neurological problems.  He states his memory is fine and he jokes that he can remember things better than his granddaughter at times.    ROS:   14 system review of systems is positive for fatigue only and all other systems negative  PMH:  Past Medical History:  Diagnosis Date  . Anginal pain ([place])   . Aortic stenosis 07/03/2014   Overview:  Mild with calculated aortic valve area of 1.05cm2  . Basal cell carcinoma, ear 03/31/2015  . Bradycardia 07/03/2014  . CHF (congestive heart failure) ([place])   . DDD (degenerative disc disease), lumbar 08/06/2015  . H/O cardiac catheterization 07/03/2014   Overview:  Cypher stent proximal and distal RCA 07/18/05 and TAXUS stent mid LAD 07/20/05 at [facility]  . HTN (hypertension) 07/03/2014  . Hyperlipidemia 07/03/2014  . Lumbosacral radiculopathy at S1 08/22/2015  . MI (myocardial infarction) ([place]) 07/03/2014   Overview:  Mi 07/17/05  . Myocardial infarction ([place])   . Normocytic normochromic anemia 09/14/2015  . Pacemaker 07/03/2014   Overview:  Dual chamber pacemaker 08/11/11  . Shortness of breath dyspnea   . SOB (shortness of breath) on exertion 05/13/2016    Social History:  Social History   Socioeconomic History  . Marital status: Widowed    Spouse name: Not on file  . Number of children: 1  . Years of education: Not on file  . Highest education level: Not on file  Occupational History  . Occupation: retired  Tobacco Use  . Smoking status: Former Smoker    Years: 10.00    Types: Cigarettes    Quit date: 1972    Years since quitting: 49.3  . Smokeless tobacco: Former Systems developer    Types: Chew  Substance and Sexual Activity  . Alcohol use: No  . Drug use: No  . Sexual activity: Not on file  Other Topics Concern  . Not on file  Social History Narrative  . Not on file   Social Determinants of Health   Financial Resource Strain: Low Risk   . Difficulty of  Paying Living Expenses: Not very hard  Food Insecurity: No Food Insecurity  . Worried About Charity fundraiser in the Last Year: Never true  . Ran Out of Food in the Last Year: Never true  Transportation Needs: No Transportation Needs  . Lack of Transportation (Medical): No  . Lack of Transportation (Non-Medical): No  Physical Activity:   . Days of Exercise per Week:   . Minutes of Exercise per Session:   Stress: No Stress Concern Present  . Feeling of Stress : Only a little  Social Connections: Unknown  . Frequency of Communication with Friends and Family: More than three times a week  . Frequency of Social Gatherings with Friends and Family: Not on file  . Attends Religious Services: Not on file  . Active Member of Clubs or Organizations: Not on file  . Attends  Club or Organization Meetings: Not on file  . Marital Status: Widowed  Intimate Partner Violence: Unknown  . Fear of Current or Ex-Partner: No  . Emotionally Abused: No  . Physically Abused: Not on file  . Sexually Abused: Not on file    Medications:   Current Outpatient Medications on File Prior to Visit  Medication Sig Dispense Refill  . acetaminophen (TYLENOL) 500 MG tablet Take 1,000 mg by mouth every 6 (six) hours as needed for mild pain or moderate pain.     Marland Kitchen apixaban (ELIQUIS) 5 MG TABS tablet Take 1 tablet (5 mg total) by mouth 2 (two) times daily. 60 tablet 6  . atorvastatin (LIPITOR) 80 MG tablet Take 80 mg by mouth daily at 6 PM.     . carvedilol (COREG) 6.25 MG tablet Take 1 tablet (6.25 mg total) by mouth 2 (two) times daily with a meal. 60 tablet 5  . furosemide (LASIX) 20 MG tablet Take 1 tablet (20 mg total) by mouth as needed (weight. Take one tablet if weight increases by 3 pounds overnight or 5 pounds in one week.). 30 tablet 0  . ipratropium-albuterol (DUONEB) 0.5-2.5 (3) MG/3ML SOLN Take 3 mLs by nebulization every 6 (six) hours as needed. 360 mL 1  . meclizine (ANTIVERT) 25 MG tablet Take 1 tablet  (25 mg total) by mouth 3 (three) times daily as needed for dizziness. 30 tablet 0  . ranolazine (RANEXA) 500 MG 12 hr tablet Take 500 mg by mouth 2 (two) times daily.    Marland Kitchen spironolactone (ALDACTONE) 25 MG tablet Take 1 tablet (25 mg total) by mouth at bedtime. 90 tablet 3  . tamsulosin (FLOMAX) 0.4 MG CAPS capsule Take 1 capsule (0.4 mg total) by mouth daily after supper. 30 capsule 6  . venlafaxine XR (EFFEXOR-XR) 75 MG 24 hr capsule Take 1 capsule (75 mg total) by mouth daily with breakfast. 30 capsule 0   No current facility-administered medications on file prior to visit.    Allergies:  No Known Allergies   Vitals: Today's Vitals   04/07/20 1513  BP: 117/72  Pulse: 84  Temp: 97.8 F (36.6 C)  Weight: 180 lb (81.6 kg)  Height: 6' (1.829 m)   Body mass index is 24.41 kg/m.   Physical Exam  General: well developed, well nourished very pleasant elderly Caucasian male, seated, in no evident distress Head: head normocephalic and atraumatic.   Neck: supple with no carotid or supraclavicular bruits Cardiovascular: regular rate and rhythm, no murmurs Musculoskeletal: no deformity.  Mild kyphoscoliosis Skin:  no rash/petichiae Vascular:  Normal pulses all extremities  Neurologic Exam:  Mental Status: Awake and fully alert. Oriented to place and time. Recent and remote memory intact. Attention span, concentration and fund of knowledge appropriate. Mood and affect appropriate.  Cranial Nerves: Pupils equal, briskly reactive to light. Extraocular movements full without nystagmus. Visual fields full to confrontation.  HOH bilaterally. Facial sensation intact. Face, tongue, palate moves normally and symmetrically.  Motor: Normal bulk and tone. Normal strength in all tested extremity muscles. Sensory.: intact to touch , pinprick , position and vibratory sensation.  Coordination: Rapid alternating movements normal in all extremities. Finger-to-nose and heel-to-shin performed accurately  bilaterally. Gait and Station: Arises from chair without difficulty. Stance is normal. Gait demonstrates normal stride length and balance with use of cane.  Reflexes: 1+ and symmetric. Toes downgoing.      ASSESSMENT: 86 year old Caucasian male with transient episode of diplopia, dizziness and left upper extremity dysmetria in  the setting of acute CHF and cardiomyopathy possibly brainstem TIA versus small infarct not visualized on imaging in May 2020.  Vascular risk factors of cardiomyopathy with EF 20 to 25%, aortic stenosis, CAD, CHF, hypertension, hyperlipidemia and age.  Recent diagnosis of prostate cancer with nodal metastasis and currently undergoing radiation.  Stable from stroke standpoint without residual or new/reoccurring stroke/TIA symptoms.  Largest complaint at today's visit is regarding fatigue    PLAN: -Continue Eliquis (apixaban) daily and atorvastatin 80 mg daily for secondary stroke prevention -Repeat echo in 09/2019 showed EF 20 to 25% and most recently 01/2020 25 to 30%.  Plans on repeating in 07/2020 and if EF > 35%, he may switch Eliquis back to aspirin/Plavix or as further recommended by cardiology -Complaints of fatigue likely multifactorial in setting of extensive cardiac disease and currently receiving daily radiation.  May consider undergoing sleep study in the future if indicated to rule out sleep apnea in patient with extensive cardiac disease -Continue to follow with PCP for HTN and HLD management and monitoring -Continue to follow with oncology receiving radiation treatments until 04/30/2020 -Continue to follow with cardiology for chronic cardiac disease monitoring management -maintain strict control of hypertension with blood pressure goal below 130/90, diabetes with hemoglobin A1c goal below 6.5% and lipids with LDL cholesterol goal below 70 mg/dL. I also advised the patient to eat a healthy diet with plenty of whole grains, cereals, fruits and vegetables, exercise  regularly and maintain ideal body weight .    Follow-up in 6 months or call earlier if needed  I spent 22 minutes of face-to-face and non-face-to-face time with patient and daughter.  This included previsit chart review, lab review, study review, order entry, electronic health record documentation, patient education regarding prior stroke, cardiac history, recent prostate cancer diagnosis currently receiving radiation, importance of managing stroke risk factors and answering questions to patient and daughter satisfaction  Frann Rider, AGNP-BC  [facility] [address] [place], [place] [address]  Phone [phone] Fax [phone] Note: This document was prepared with digital dictation and possible smart phrase technology. Any transcriptional errors that result from this process are unintentional.

## 2020-04-07 NOTE — Patient Instructions (Signed)
Continue Eliquis (apixaban) daily  and atorvastatin 80 mg daily for secondary stroke prevention  Continue to follow up with PCP regarding cholesterol and blood pressure management   Continue to follow with cardiology and oncology as scheduled  Maintain strict control of hypertension with blood pressure goal below 130/90, diabetes with hemoglobin A1c goal below 6.5% and cholesterol with LDL cholesterol (bad cholesterol) goal below 70 mg/dL. I also advised the patient to eat a healthy diet with plenty of whole grains, cereals, fruits and vegetables, exercise regularly and maintain ideal body weight.  Followup in the future with me in 6 months or call earlier if needed       Thank you for coming to see [place] at [facility]. I hope we have been able to provide you high quality care today.  You may receive a patient satisfaction survey over the next few weeks. We would appreciate your feedback and comments so that we may continue to improve ourselves and the health of our patients.

## 2020-04-08 ENCOUNTER — Ambulatory Visit
Admission: RE | Admit: 2020-04-08 | Discharge: 2020-04-08 | Disposition: A | Discharge status: Home / Self Care | Payer: Medicare Other | Source: Ambulatory Visit | Attending: Radiation Oncology | Admitting: Radiation Oncology

## 2020-04-08 DIAGNOSIS — C61 Malignant neoplasm of prostate: Secondary | ICD-10-CM | POA: Diagnosis not present

## 2020-04-09 ENCOUNTER — Ambulatory Visit
Admission: RE | Admit: 2020-04-09 | Discharge: 2020-04-09 | Disposition: A | Discharge status: Home / Self Care | Payer: Medicare Other | Source: Ambulatory Visit | Attending: Radiation Oncology | Admitting: Radiation Oncology

## 2020-04-09 DIAGNOSIS — C61 Malignant neoplasm of prostate: Secondary | ICD-10-CM | POA: Diagnosis not present

## 2020-04-10 ENCOUNTER — Ambulatory Visit
Admission: RE | Admit: 2020-04-10 | Discharge: 2020-04-10 | Disposition: A | Discharge status: Home / Self Care | Payer: Medicare Other | Source: Ambulatory Visit | Attending: Radiation Oncology | Admitting: Radiation Oncology

## 2020-04-10 DIAGNOSIS — C61 Malignant neoplasm of prostate: Secondary | ICD-10-CM | POA: Diagnosis not present

## 2020-04-11 ENCOUNTER — Ambulatory Visit
Admission: RE | Admit: 2020-04-11 | Discharge: 2020-04-11 | Disposition: A | Discharge status: Home / Self Care | Payer: Medicare Other | Source: Ambulatory Visit | Attending: Radiation Oncology | Admitting: Radiation Oncology

## 2020-04-11 DIAGNOSIS — C61 Malignant neoplasm of prostate: Secondary | ICD-10-CM | POA: Diagnosis not present

## 2020-04-14 ENCOUNTER — Ambulatory Visit
Admission: RE | Admit: 2020-04-14 | Discharge: 2020-04-14 | Disposition: A | Discharge status: Home / Self Care | Payer: Medicare Other | Source: Ambulatory Visit | Attending: Radiation Oncology | Admitting: Radiation Oncology

## 2020-04-14 DIAGNOSIS — C61 Malignant neoplasm of prostate: Secondary | ICD-10-CM | POA: Diagnosis not present

## 2020-04-15 ENCOUNTER — Ambulatory Visit
Admission: RE | Admit: 2020-04-15 | Discharge: 2020-04-15 | Disposition: A | Discharge status: Home / Self Care | Payer: Medicare Other | Source: Ambulatory Visit | Attending: Radiation Oncology | Admitting: Radiation Oncology

## 2020-04-15 DIAGNOSIS — C61 Malignant neoplasm of prostate: Secondary | ICD-10-CM | POA: Diagnosis not present

## 2020-04-16 ENCOUNTER — Ambulatory Visit
Admission: RE | Admit: 2020-04-16 | Discharge: 2020-04-16 | Disposition: A | Discharge status: Home / Self Care | Payer: Medicare Other | Source: Ambulatory Visit | Attending: Radiation Oncology | Admitting: Radiation Oncology

## 2020-04-16 DIAGNOSIS — C61 Malignant neoplasm of prostate: Secondary | ICD-10-CM | POA: Diagnosis not present

## 2020-04-17 ENCOUNTER — Ambulatory Visit
Admission: RE | Admit: 2020-04-17 | Discharge: 2020-04-17 | Disposition: A | Discharge status: Home / Self Care | Payer: Medicare Other | Source: Ambulatory Visit | Attending: Radiation Oncology | Admitting: Radiation Oncology

## 2020-04-17 ENCOUNTER — Inpatient Hospital Stay: Payer: Medicare Other

## 2020-04-17 ENCOUNTER — Other Ambulatory Visit: Payer: Self-pay

## 2020-04-17 DIAGNOSIS — C61 Malignant neoplasm of prostate: Secondary | ICD-10-CM

## 2020-04-17 LAB — CBC
HCT: 36.7 % — ABNORMAL LOW (ref 39.0–52.0)
Hemoglobin: 12.4 g/dL — ABNORMAL LOW (ref 13.0–17.0)
MCH: 30.8 pg (ref 26.0–34.0)
MCHC: 33.8 g/dL (ref 30.0–36.0)
MCV: 91.1 fL (ref 80.0–100.0)
Platelets: 94 10*3/uL — ABNORMAL LOW (ref 150–400)
RBC: 4.03 MIL/uL — ABNORMAL LOW (ref 4.22–5.81)
RDW: 13.4 % (ref 11.5–15.5)
WBC: 4.3 10*3/uL (ref 4.0–10.5)
nRBC: 0 % (ref 0.0–0.2)

## 2020-04-18 ENCOUNTER — Ambulatory Visit
Admission: RE | Admit: 2020-04-18 | Discharge: 2020-04-18 | Disposition: A | Discharge status: Home / Self Care | Payer: Medicare Other | Source: Ambulatory Visit | Attending: Radiation Oncology | Admitting: Radiation Oncology

## 2020-04-18 DIAGNOSIS — C61 Malignant neoplasm of prostate: Secondary | ICD-10-CM | POA: Diagnosis not present

## 2020-04-21 ENCOUNTER — Ambulatory Visit
Admission: RE | Admit: 2020-04-21 | Discharge: 2020-04-21 | Disposition: A | Discharge status: Home / Self Care | Payer: Medicare Other | Source: Ambulatory Visit | Attending: Radiation Oncology | Admitting: Radiation Oncology

## 2020-04-21 DIAGNOSIS — C61 Malignant neoplasm of prostate: Secondary | ICD-10-CM | POA: Diagnosis not present

## 2020-04-21 DIAGNOSIS — Z510 Encounter for antineoplastic radiation therapy: Secondary | ICD-10-CM | POA: Insufficient documentation

## 2020-04-22 ENCOUNTER — Ambulatory Visit
Admission: RE | Admit: 2020-04-22 | Discharge: 2020-04-22 | Disposition: A | Discharge status: Home / Self Care | Payer: Medicare Other | Source: Ambulatory Visit | Attending: Radiation Oncology | Admitting: Radiation Oncology

## 2020-04-22 DIAGNOSIS — C61 Malignant neoplasm of prostate: Secondary | ICD-10-CM | POA: Diagnosis not present

## 2020-04-23 ENCOUNTER — Ambulatory Visit
Admission: RE | Admit: 2020-04-23 | Discharge: 2020-04-23 | Disposition: A | Discharge status: Home / Self Care | Payer: Medicare Other | Source: Ambulatory Visit | Attending: Radiation Oncology | Admitting: Radiation Oncology

## 2020-04-23 DIAGNOSIS — C61 Malignant neoplasm of prostate: Secondary | ICD-10-CM | POA: Diagnosis not present

## 2020-04-24 ENCOUNTER — Ambulatory Visit
Admission: RE | Admit: 2020-04-24 | Discharge: 2020-04-24 | Disposition: A | Discharge status: Home / Self Care | Payer: Medicare Other | Source: Ambulatory Visit | Attending: Radiation Oncology | Admitting: Radiation Oncology

## 2020-04-24 DIAGNOSIS — C61 Malignant neoplasm of prostate: Secondary | ICD-10-CM | POA: Diagnosis not present

## 2020-04-25 ENCOUNTER — Ambulatory Visit
Admission: RE | Admit: 2020-04-25 | Discharge: 2020-04-25 | Disposition: A | Discharge status: Home / Self Care | Payer: Medicare Other | Source: Ambulatory Visit | Attending: Radiation Oncology | Admitting: Radiation Oncology

## 2020-04-25 DIAGNOSIS — C61 Malignant neoplasm of prostate: Secondary | ICD-10-CM | POA: Diagnosis not present

## 2020-04-28 ENCOUNTER — Ambulatory Visit
Admission: RE | Admit: 2020-04-28 | Discharge: 2020-04-28 | Disposition: A | Discharge status: Home / Self Care | Payer: Medicare Other | Source: Ambulatory Visit | Attending: Radiation Oncology | Admitting: Radiation Oncology

## 2020-04-28 DIAGNOSIS — C61 Malignant neoplasm of prostate: Secondary | ICD-10-CM | POA: Diagnosis not present

## 2020-04-29 ENCOUNTER — Ambulatory Visit
Admission: RE | Admit: 2020-04-29 | Discharge: 2020-04-29 | Disposition: A | Discharge status: Home / Self Care | Payer: Medicare Other | Source: Ambulatory Visit | Attending: Radiation Oncology | Admitting: Radiation Oncology

## 2020-04-29 DIAGNOSIS — C61 Malignant neoplasm of prostate: Secondary | ICD-10-CM | POA: Diagnosis not present

## 2020-04-30 ENCOUNTER — Ambulatory Visit: Payer: Medicare Other

## 2020-04-30 ENCOUNTER — Ambulatory Visit
Admission: RE | Admit: 2020-04-30 | Discharge: 2020-04-30 | Disposition: A | Discharge status: Home / Self Care | Payer: Medicare Other | Source: Ambulatory Visit | Attending: Radiation Oncology | Admitting: Radiation Oncology

## 2020-04-30 DIAGNOSIS — C61 Malignant neoplasm of prostate: Secondary | ICD-10-CM | POA: Diagnosis not present

## 2020-05-01 ENCOUNTER — Ambulatory Visit
Admission: RE | Admit: 2020-05-01 | Discharge: 2020-05-01 | Disposition: A | Discharge status: Home / Self Care | Payer: Medicare Other | Source: Ambulatory Visit | Attending: Radiation Oncology | Admitting: Radiation Oncology

## 2020-05-01 DIAGNOSIS — C61 Malignant neoplasm of prostate: Secondary | ICD-10-CM | POA: Diagnosis not present

## 2020-05-07 ENCOUNTER — Encounter (HOSPITAL_COMMUNITY): Payer: Self-pay | Admitting: Cardiology

## 2020-05-07 ENCOUNTER — Ambulatory Visit (HOSPITAL_COMMUNITY)
Admission: RE | Admit: 2020-05-07 | Discharge: 2020-05-07 | Disposition: A | Discharge status: Home / Self Care | Payer: Medicare Other | Source: Ambulatory Visit | Attending: Cardiology | Admitting: Cardiology

## 2020-05-07 ENCOUNTER — Other Ambulatory Visit: Payer: Self-pay

## 2020-05-07 VITALS — BP 110/60 | HR 75 | Wt 186.6 lb

## 2020-05-07 DIAGNOSIS — Z87891 Personal history of nicotine dependence: Secondary | ICD-10-CM | POA: Diagnosis not present

## 2020-05-07 DIAGNOSIS — I951 Orthostatic hypotension: Secondary | ICD-10-CM | POA: Diagnosis not present

## 2020-05-07 DIAGNOSIS — Z7901 Long term (current) use of anticoagulants: Secondary | ICD-10-CM | POA: Insufficient documentation

## 2020-05-07 DIAGNOSIS — I255 Ischemic cardiomyopathy: Secondary | ICD-10-CM | POA: Diagnosis not present

## 2020-05-07 DIAGNOSIS — Z923 Personal history of irradiation: Secondary | ICD-10-CM | POA: Insufficient documentation

## 2020-05-07 DIAGNOSIS — I350 Nonrheumatic aortic (valve) stenosis: Secondary | ICD-10-CM | POA: Insufficient documentation

## 2020-05-07 DIAGNOSIS — I5022 Chronic systolic (congestive) heart failure: Secondary | ICD-10-CM | POA: Insufficient documentation

## 2020-05-07 DIAGNOSIS — N183 Chronic kidney disease, stage 3 unspecified: Secondary | ICD-10-CM | POA: Insufficient documentation

## 2020-05-07 DIAGNOSIS — C61 Malignant neoplasm of prostate: Secondary | ICD-10-CM | POA: Diagnosis not present

## 2020-05-07 DIAGNOSIS — M519 Unspecified thoracic, thoracolumbar and lumbosacral intervertebral disc disorder: Secondary | ICD-10-CM | POA: Diagnosis not present

## 2020-05-07 DIAGNOSIS — M545 Low back pain: Secondary | ICD-10-CM | POA: Insufficient documentation

## 2020-05-07 DIAGNOSIS — Z79899 Other long term (current) drug therapy: Secondary | ICD-10-CM | POA: Diagnosis not present

## 2020-05-07 DIAGNOSIS — J449 Chronic obstructive pulmonary disease, unspecified: Secondary | ICD-10-CM | POA: Diagnosis not present

## 2020-05-07 DIAGNOSIS — Z950 Presence of cardiac pacemaker: Secondary | ICD-10-CM | POA: Diagnosis not present

## 2020-05-07 DIAGNOSIS — I130 Hypertensive heart and chronic kidney disease with heart failure and stage 1 through stage 4 chronic kidney disease, or unspecified chronic kidney disease: Secondary | ICD-10-CM | POA: Insufficient documentation

## 2020-05-07 DIAGNOSIS — I495 Sick sinus syndrome: Secondary | ICD-10-CM | POA: Diagnosis not present

## 2020-05-07 DIAGNOSIS — F329 Major depressive disorder, single episode, unspecified: Secondary | ICD-10-CM | POA: Diagnosis not present

## 2020-05-07 DIAGNOSIS — I252 Old myocardial infarction: Secondary | ICD-10-CM | POA: Diagnosis not present

## 2020-05-07 DIAGNOSIS — E785 Hyperlipidemia, unspecified: Secondary | ICD-10-CM | POA: Diagnosis not present

## 2020-05-07 DIAGNOSIS — I2510 Atherosclerotic heart disease of native coronary artery without angina pectoris: Secondary | ICD-10-CM | POA: Diagnosis not present

## 2020-05-07 MED ORDER — SPIRONOLACTONE 25 MG PO TABS: 12.5000 mg | ORAL_TABLET | Freq: Every day | ORAL | 3 refills | Status: DC

## 2020-05-07 NOTE — Progress Notes (Signed)
PCP: Tracie Harrier, MD HF Cardiology: Dr. Aundra Dubin  86 y.o. with history of CAD s/p multiple PCIs, ischemic cardiomyopathy, sick sinus syndrome s/p PPM, and CKD stage 3 returns for followup of CHF and lightheadedness.   Patient has a long history of CAD detailed in the [place] below.  He also has an ischemic cardiomyopathy, with echo in 1/20 showing EF 40-45%. He had a Medtronic pacemaker placed for bradycardia in 2012. He was admitted with somewhat atypical chest pain in 5/20, TnI was mildly elevated at 0.08, with serial troponins there was no trend (maximal 0.09).  He underwent left heart cath, showing stable disease with no intervention planned (chronic occlusion of mid LCx and 85% ostial PDA).  His echo showed that EF was lower than prior at 20-25%.  He was thought to be volume overloaded.  He was diuresed with IV Lasix, and Coreg + Entresto were started.  Patient's BP fell significantly with initiation of cardiac meds and he had orthostatic symptoms.  All his cardiac meds had to be stopped.  There was concern that he may have low output HF and may require inotropic support, so he was transferred to [facility].  RHC at [facility] showed low filling pressures and normal cardiac output.  He continued to have dizziness and also cerebellar signs.  He was seen by neurology and suspected to have a cerebellar CVA.  CTA head showed multiple old small vessel infarcts, no MRI done due to PPM.  Eliquis was started based on neurology recommendation given low EF and CVA.  He gradually improved and was discharged home.   Echo was done in 10/20, ordered by neurology.  EF remained 20-25%, but aortic stenosis was reported as severe. I had not seen this echo prior but reviewed today. Calculated AVA < 1 but highest mean gradient obtained was 16 mmHg.  Based on appearance of valve, this certainly could be low gradient severe AS but valve not seen well.    Low dose DSE was done in 1/21.  This was suggestive of moderate AS  approaching severe.   Patient was noted to have a pancreatic mass on CT but PET scan was not suggestive of pancreatic cancer.  He does have prostate cancer and was treated with radiation, recently completed.   He returns today for followup of CHF and AS.   He has felt markedly worse since starting radiation treatment for his prostate cancer.  He is short of breath after walking 50 feet and fatigued.  Prior to radiation, he really had minimal dyspnea with exertion.  No chest pain.  No orthopnea/PND.  Weight is unchanged.   Labs (6/20): K 5.1 => 4.2, creatinine 1.46 => 1.33, LDL 75 Labs (12/20): K 4.2, creatinine 1.27 Labs (1/21):  Myeloma panel negative, urine IFXN negative, LDL 49 Labs (2/21): K 4.4, creatinine 1.54, hgb 13.6 Labs (4/21): hgb 12.4 Labs (5/21): K 5.2, creatinine 1.3  Past Medical History: 1. Low back pain/degenerative disc disease.  2. Sick sinus syndrome: Dual chamber Medtronic PPM placed 8/12.  3. Aortic stenosis: Mild to moderate on 5/20 echo.  - Echo in 10/20 with ?low gradient severe AS with mean gradient only 15 mmHg but calculated AVA 0.78 cm^2.  - Low dose DSE in 1/21: Moderate AS approaching severe. Rest mean gradient 13 with DVI 0.31 and AVA 1.1 cm^2 => peak stress mean gradient 34 mmHg with DVI 0.28 and AVA 1.0 cm^2.  4. CAD: Extensive history.  - 7/06: Inferior MI with Cypher DES to prox  and distal RCA.  - 07/20/05: Taxus DES to mLAD.  - 1/11: Xience DES to OM1 - 4/15: Overlapping Xience DES x 2 to ostial and proximal RCA.  - LHC (5/20): Nonobstructive disease in LAD, occluded mid LCx (chronic), 85% ostial PDA.  No intervention.  5. Chronic systolic CHF: Ischemic cardiomyopathy.   - Echo (1/20) with EF 40-45%.  - Echo (5/20) with EF 20-25%, normal RV, mild-moderate MR, mild-moderate AS with mean gradient 11 mmHg and AVA 1.47 cm^2.  - RHC (5/20): mean RA 3, PA 38/8 mean 18, mean PCWP 6, CI 2.68.  - Echo (10/20): EF 20-25%, possible low gradient severe AS.  - PYP  scan negative in 2/21.  6. CKD: Stage 3.  7. COPD: Remote smoking. 8. HTN 9. Hyperlipidemia 10. Depression 11. CVA: Suspected cerebellar CVA in 5/20.  Eliquis started.  - CTA head (unable to do MRI with PPM): <50% carotid stenosis bilaterally.  Multiple old small vessel infarcts.  12. Prostate Cancer: Radiation therapy, ending 5/21.  13. Pancreatic mass: Elevated CA 19-9.  PET scan was not suggestive of cancer.   Social History   Socioeconomic History  . Marital status: Widowed    Spouse name: Not on file  . Number of children: 1  . Years of education: Not on file  . Highest education level: Not on file  Occupational History  . Occupation: retired  Tobacco Use  . Smoking status: Former Smoker    Years: 10.00    Types: Cigarettes    Quit date: 1972    Years since quitting: 49.4  . Smokeless tobacco: Former Systems developer    Types: Chew  Substance and Sexual Activity  . Alcohol use: No  . Drug use: No  . Sexual activity: Not on file  Other Topics Concern  . Not on file  Social History Narrative  . Not on file   Social Determinants of Health   Financial Resource Strain: Low Risk   . Difficulty of Paying Living Expenses: Not very hard  Food Insecurity: No Food Insecurity  . Worried About Charity fundraiser in the Last Year: Never true  . Ran Out of Food in the Last Year: Never true  Transportation Needs: No Transportation Needs  . Lack of Transportation (Medical): No  . Lack of Transportation (Non-Medical): No  Physical Activity:   . Days of Exercise per Week:   . Minutes of Exercise per Session:   Stress: No Stress Concern Present  . Feeling of Stress : Only a little  Social Connections: Unknown  . Frequency of Communication with Friends and Family: More than three times a week  . Frequency of Social Gatherings with Friends and Family: Not on file  . Attends Religious Services: Not on file  . Active Member of Clubs or Organizations: Not on file  . Attends Theatre manager Meetings: Not on file  . Marital Status: Widowed  Intimate Partner Violence: Unknown  . Fear of Current or Ex-Partner: No  . Emotionally Abused: No  . Physically Abused: Not on file  . Sexually Abused: Not on file   Family History  Problem Relation Age of Onset  . Bladder Cancer Neg Hx   . Kidney cancer Neg Hx   . Prostate cancer Neg Hx    ROS: All systems reviewed and negative except as per HPI.   Current Outpatient Medications  Medication Sig Dispense Refill  . acetaminophen (TYLENOL) 500 MG tablet Take 1,000 mg by mouth every 6 (six) hours  as needed for mild pain or moderate pain.     Marland Kitchen apixaban (ELIQUIS) 5 MG TABS tablet Take 1 tablet (5 mg total) by mouth 2 (two) times daily. 60 tablet 6  . atorvastatin (LIPITOR) 80 MG tablet Take 80 mg by mouth daily at 6 PM.     . carvedilol (COREG) 6.25 MG tablet Take 1 tablet (6.25 mg total) by mouth 2 (two) times daily with a meal. 60 tablet 5  . furosemide (LASIX) 20 MG tablet Take 1 tablet (20 mg total) by mouth as needed (weight. Take one tablet if weight increases by 3 pounds overnight or 5 pounds in one week.). 30 tablet 0  . ipratropium-albuterol (DUONEB) 0.5-2.5 (3) MG/3ML SOLN Take 3 mLs by nebulization every 6 (six) hours as needed. 360 mL 1  . meclizine (ANTIVERT) 25 MG tablet Take 1 tablet (25 mg total) by mouth 3 (three) times daily as needed for dizziness. 30 tablet 0  . ranolazine (RANEXA) 500 MG 12 hr tablet Take 500 mg by mouth 2 (two) times daily.    Marland Kitchen spironolactone (ALDACTONE) 25 MG tablet Take 0.5 tablets (12.5 mg total) by mouth at bedtime. 45 tablet 3  . tamsulosin (FLOMAX) 0.4 MG CAPS capsule Take 1 capsule (0.4 mg total) by mouth daily after supper. 30 capsule 6  . venlafaxine XR (EFFEXOR-XR) 75 MG 24 hr capsule Take 1 capsule (75 mg total) by mouth daily with breakfast. 30 capsule 0   No current facility-administered medications for this encounter.     BP 110/60   Pulse 75   Wt 84.6 kg (186 lb 9.6  oz)   SpO2 97%   BMI 25.31 kg/m  General: NAD Neck: No JVD, no thyromegaly or thyroid nodule.  Lungs: Distant BS CV: Nondisplaced PMI.  Heart regular S1/S2, no S3/S4, 3/6 SEM RUSB.  No peripheral edema.  No carotid bruit.  Normal pedal pulses.  Abdomen: Soft, nontender, no hepatosplenomegaly, no distention.  Skin: Intact without lesions or rashes.  Neurologic: Alert and oriented x 3.  Psych: Normal affect. Extremities: No clubbing or cyanosis.  HEENT: Normal.    Assessment/Plan: 1. Chronic systolic CHF: Ischemic cardiomyopathy. Echo in 1/20 with EF 40-45%, but now down to 20-25% on echo from 5/20. The cause of the fall in EF is uncertain, he had coronary angiogram at [facility] in 5/20 with stable anatomy (no intervention).  He was been pacing his RV only 5% of the time when device was interrogated.  RHC in 5/20 showed low filling pressures and good cardiac output.  Echo in 10/20 showed stable EF 20-25% range.  BP has tended to run low with orthostatic hypotension when cardiac meds are added but doing well on current regimen. No evidence for amyloidosis with negative myeloma panel and urine IFXN as well as PYP scan negative.  Increased dyspnea recently, NYHA class III symptoms, but he is not volume overloaded on exam.   - Continue Coreg 6.25 mg bid.     - He does not appear to need a diuretic.  - Would hold off on ACEI/ARB/ARNI given orthostasis/soft BP.  - Continue spironolactone 12.5 daily.   2. CAD: Multiple interventions in the past.  Cath in 5/20 showed stable anatomy, no intervention. No chest pain.  - He was started on Eliquis per neurology due to CVA with low EF, now off ASA and Plavix.   - Continue atorvastatin 80 mg daily, good lipids in 1/21.   - Continue ranolazine.  - If severe AS confirmed, he  will need RHC/LHC prior to AVR.  3. CKD: Stage 3. Recent BMET stable.  4. Aortic stenosis: Mild to moderate on echo in 5/20.  On 10/20 echo, the valve was not well visualized by TTE but  certainly appearance could be consistent with severe AS.  AVA 0.78 cm^2 with mean gradient only 15 mmHg.  ?Low gradient severe AS.  Low dose DSE was done, this was suggestive of moderate but nearing severe AS.  He now has had marked worsening of dyspnea in absence of volume overload.  He has not been significantly anemic.  Radiation for prostate cancer likely plays a role in this symptomatology, but I am concerned that symptoms come in part from AS.  - I will arrange for TEE to more closely assess aortic valve.  We discussed risks and benefits, he agrees to procedure.  - If AS looks severe, will arrange for RHC/LHC and refer for TAVR.    5. Suspected cerebellar CVA: In 5/20.  Dizziness/?vertigo with dysmetria and diplopia on exam.  Seen by neurology and felt to have cerebellar stroke, repeat CT head with no acute changes (old small vessel infarcts). Carotid u/s 1-39% bilaterally. Vertebrals ok.  No MRI due to PPM.  Had CTA head/neck without large vessel occlusion or stenosis.  - He was started on apixaban and stopped ASA/Plavix with low EF.   6. Sick sinus syndrome: MDT dual chamber pacemaker, RV pacing appears rare.  - Arrange pacemaker followup.   7. Prostate cancer: He has just completed radiation therapy.   Loralie Champagne 05/07/2020

## 2020-05-07 NOTE — H&P (View-Only) (Signed)
PCP: Tracie Harrier, MD HF Cardiology: Dr. Aundra Dubin  86 y.o. with history of CAD s/p multiple PCIs, ischemic cardiomyopathy, sick sinus syndrome s/p PPM, and CKD stage 3 returns for followup of CHF and lightheadedness.   Patient has a long history of CAD detailed in the [place] below.  He also has an ischemic cardiomyopathy, with echo in 1/20 showing EF 40-45%. He had a Medtronic pacemaker placed for bradycardia in 2012. He was admitted with somewhat atypical chest pain in 5/20, TnI was mildly elevated at 0.08, with serial troponins there was no trend (maximal 0.09).  He underwent left heart cath, showing stable disease with no intervention planned (chronic occlusion of mid LCx and 85% ostial PDA).  His echo showed that EF was lower than prior at 20-25%.  He was thought to be volume overloaded.  He was diuresed with IV Lasix, and Coreg + Entresto were started.  Patient's BP fell significantly with initiation of cardiac meds and he had orthostatic symptoms.  All his cardiac meds had to be stopped.  There was concern that he may have low output HF and may require inotropic support, so he was transferred to [facility].  RHC at [facility] showed low filling pressures and normal cardiac output.  He continued to have dizziness and also cerebellar signs.  He was seen by neurology and suspected to have a cerebellar CVA.  CTA head showed multiple old small vessel infarcts, no MRI done due to PPM.  Eliquis was started based on neurology recommendation given low EF and CVA.  He gradually improved and was discharged home.   Echo was done in 10/20, ordered by neurology.  EF remained 20-25%, but aortic stenosis was reported as severe. I had not seen this echo prior but reviewed today. Calculated AVA < 1 but highest mean gradient obtained was 16 mmHg.  Based on appearance of valve, this certainly could be low gradient severe AS but valve not seen well.    Low dose DSE was done in 1/21.  This was suggestive of moderate AS  approaching severe.   Patient was noted to have a pancreatic mass on CT but PET scan was not suggestive of pancreatic cancer.  He does have prostate cancer and was treated with radiation, recently completed.   He returns today for followup of CHF and AS.   He has felt markedly worse since starting radiation treatment for his prostate cancer.  He is short of breath after walking 50 feet and fatigued.  Prior to radiation, he really had minimal dyspnea with exertion.  No chest pain.  No orthopnea/PND.  Weight is unchanged.   Labs (6/20): K 5.1 => 4.2, creatinine 1.46 => 1.33, LDL 75 Labs (12/20): K 4.2, creatinine 1.27 Labs (1/21):  Myeloma panel negative, urine IFXN negative, LDL 49 Labs (2/21): K 4.4, creatinine 1.54, hgb 13.6 Labs (4/21): hgb 12.4 Labs (5/21): K 5.2, creatinine 1.3  Past Medical History: 1. Low back pain/degenerative disc disease.  2. Sick sinus syndrome: Dual chamber Medtronic PPM placed 8/12.  3. Aortic stenosis: Mild to moderate on 5/20 echo.  - Echo in 10/20 with ?low gradient severe AS with mean gradient only 15 mmHg but calculated AVA 0.78 cm^2.  - Low dose DSE in 1/21: Moderate AS approaching severe. Rest mean gradient 13 with DVI 0.31 and AVA 1.1 cm^2 => peak stress mean gradient 34 mmHg with DVI 0.28 and AVA 1.0 cm^2.  4. CAD: Extensive history.  - 7/06: Inferior MI with Cypher DES to prox  and distal RCA.  - 07/20/05: Taxus DES to mLAD.  - 1/11: Xience DES to OM1 - 4/15: Overlapping Xience DES x 2 to ostial and proximal RCA.  - LHC (5/20): Nonobstructive disease in LAD, occluded mid LCx (chronic), 85% ostial PDA.  No intervention.  5. Chronic systolic CHF: Ischemic cardiomyopathy.   - Echo (1/20) with EF 40-45%.  - Echo (5/20) with EF 20-25%, normal RV, mild-moderate MR, mild-moderate AS with mean gradient 11 mmHg and AVA 1.47 cm^2.  - RHC (5/20): mean RA 3, PA 38/8 mean 18, mean PCWP 6, CI 2.68.  - Echo (10/20): EF 20-25%, possible low gradient severe AS.  - PYP  scan negative in 2/21.  6. CKD: Stage 3.  7. COPD: Remote smoking. 8. HTN 9. Hyperlipidemia 10. Depression 11. CVA: Suspected cerebellar CVA in 5/20.  Eliquis started.  - CTA head (unable to do MRI with PPM): <50% carotid stenosis bilaterally.  Multiple old small vessel infarcts.  12. Prostate Cancer: Radiation therapy, ending 5/21.  13. Pancreatic mass: Elevated CA 19-9.  PET scan was not suggestive of cancer.   Social History   Socioeconomic History  . Marital status: Widowed    Spouse name: Not on file  . Number of children: 1  . Years of education: Not on file  . Highest education level: Not on file  Occupational History  . Occupation: retired  Tobacco Use  . Smoking status: Former Smoker    Years: 10.00    Types: Cigarettes    Quit date: 1972    Years since quitting: 49.4  . Smokeless tobacco: Former Systems developer    Types: Chew  Substance and Sexual Activity  . Alcohol use: No  . Drug use: No  . Sexual activity: Not on file  Other Topics Concern  . Not on file  Social History Narrative  . Not on file   Social Determinants of Health   Financial Resource Strain: Low Risk   . Difficulty of Paying Living Expenses: Not very hard  Food Insecurity: No Food Insecurity  . Worried About Charity fundraiser in the Last Year: Never true  . Ran Out of Food in the Last Year: Never true  Transportation Needs: No Transportation Needs  . Lack of Transportation (Medical): No  . Lack of Transportation (Non-Medical): No  Physical Activity:   . Days of Exercise per Week:   . Minutes of Exercise per Session:   Stress: No Stress Concern Present  . Feeling of Stress : Only a little  Social Connections: Unknown  . Frequency of Communication with Friends and Family: More than three times a week  . Frequency of Social Gatherings with Friends and Family: Not on file  . Attends Religious Services: Not on file  . Active Member of Clubs or Organizations: Not on file  . Attends Theatre manager Meetings: Not on file  . Marital Status: Widowed  Intimate Partner Violence: Unknown  . Fear of Current or Ex-Partner: No  . Emotionally Abused: No  . Physically Abused: Not on file  . Sexually Abused: Not on file   Family History  Problem Relation Age of Onset  . Bladder Cancer Neg Hx   . Kidney cancer Neg Hx   . Prostate cancer Neg Hx    ROS: All systems reviewed and negative except as per HPI.   Current Outpatient Medications  Medication Sig Dispense Refill  . acetaminophen (TYLENOL) 500 MG tablet Take 1,000 mg by mouth every 6 (six) hours  as needed for mild pain or moderate pain.     Marland Kitchen apixaban (ELIQUIS) 5 MG TABS tablet Take 1 tablet (5 mg total) by mouth 2 (two) times daily. 60 tablet 6  . atorvastatin (LIPITOR) 80 MG tablet Take 80 mg by mouth daily at 6 PM.     . carvedilol (COREG) 6.25 MG tablet Take 1 tablet (6.25 mg total) by mouth 2 (two) times daily with a meal. 60 tablet 5  . furosemide (LASIX) 20 MG tablet Take 1 tablet (20 mg total) by mouth as needed (weight. Take one tablet if weight increases by 3 pounds overnight or 5 pounds in one week.). 30 tablet 0  . ipratropium-albuterol (DUONEB) 0.5-2.5 (3) MG/3ML SOLN Take 3 mLs by nebulization every 6 (six) hours as needed. 360 mL 1  . meclizine (ANTIVERT) 25 MG tablet Take 1 tablet (25 mg total) by mouth 3 (three) times daily as needed for dizziness. 30 tablet 0  . ranolazine (RANEXA) 500 MG 12 hr tablet Take 500 mg by mouth 2 (two) times daily.    Marland Kitchen spironolactone (ALDACTONE) 25 MG tablet Take 0.5 tablets (12.5 mg total) by mouth at bedtime. 45 tablet 3  . tamsulosin (FLOMAX) 0.4 MG CAPS capsule Take 1 capsule (0.4 mg total) by mouth daily after supper. 30 capsule 6  . venlafaxine XR (EFFEXOR-XR) 75 MG 24 hr capsule Take 1 capsule (75 mg total) by mouth daily with breakfast. 30 capsule 0   No current facility-administered medications for this encounter.     BP 110/60   Pulse 75   Wt 84.6 kg (186 lb 9.6  oz)   SpO2 97%   BMI 25.31 kg/m  General: NAD Neck: No JVD, no thyromegaly or thyroid nodule.  Lungs: Distant BS CV: Nondisplaced PMI.  Heart regular S1/S2, no S3/S4, 3/6 SEM RUSB.  No peripheral edema.  No carotid bruit.  Normal pedal pulses.  Abdomen: Soft, nontender, no hepatosplenomegaly, no distention.  Skin: Intact without lesions or rashes.  Neurologic: Alert and oriented x 3.  Psych: Normal affect. Extremities: No clubbing or cyanosis.  HEENT: Normal.    Assessment/Plan: 1. Chronic systolic CHF: Ischemic cardiomyopathy. Echo in 1/20 with EF 40-45%, but now down to 20-25% on echo from 5/20. The cause of the fall in EF is uncertain, he had coronary angiogram at [facility] in 5/20 with stable anatomy (no intervention).  He was been pacing his RV only 5% of the time when device was interrogated.  RHC in 5/20 showed low filling pressures and good cardiac output.  Echo in 10/20 showed stable EF 20-25% range.  BP has tended to run low with orthostatic hypotension when cardiac meds are added but doing well on current regimen. No evidence for amyloidosis with negative myeloma panel and urine IFXN as well as PYP scan negative.  Increased dyspnea recently, NYHA class III symptoms, but he is not volume overloaded on exam.   - Continue Coreg 6.25 mg bid.     - He does not appear to need a diuretic.  - Would hold off on ACEI/ARB/ARNI given orthostasis/soft BP.  - Continue spironolactone 12.5 daily.   2. CAD: Multiple interventions in the past.  Cath in 5/20 showed stable anatomy, no intervention. No chest pain.  - He was started on Eliquis per neurology due to CVA with low EF, now off ASA and Plavix.   - Continue atorvastatin 80 mg daily, good lipids in 1/21.   - Continue ranolazine.  - If severe AS confirmed, he  will need RHC/LHC prior to AVR.  3. CKD: Stage 3. Recent BMET stable.  4. Aortic stenosis: Mild to moderate on echo in 5/20.  On 10/20 echo, the valve was not well visualized by TTE but  certainly appearance could be consistent with severe AS.  AVA 0.78 cm^2 with mean gradient only 15 mmHg.  ?Low gradient severe AS.  Low dose DSE was done, this was suggestive of moderate but nearing severe AS.  He now has had marked worsening of dyspnea in absence of volume overload.  He has not been significantly anemic.  Radiation for prostate cancer likely plays a role in this symptomatology, but I am concerned that symptoms come in part from AS.  - I will arrange for TEE to more closely assess aortic valve.  We discussed risks and benefits, he agrees to procedure.  - If AS looks severe, will arrange for RHC/LHC and refer for TAVR.    5. Suspected cerebellar CVA: In 5/20.  Dizziness/?vertigo with dysmetria and diplopia on exam.  Seen by neurology and felt to have cerebellar stroke, repeat CT head with no acute changes (old small vessel infarcts). Carotid u/s 1-39% bilaterally. Vertebrals ok.  No MRI due to PPM.  Had CTA head/neck without large vessel occlusion or stenosis.  - He was started on apixaban and stopped ASA/Plavix with low EF.   6. Sick sinus syndrome: MDT dual chamber pacemaker, RV pacing appears rare.  - Arrange pacemaker followup.   7. Prostate cancer: He has just completed radiation therapy.   Loralie Champagne 05/07/2020

## 2020-05-07 NOTE — Patient Instructions (Addendum)
No labs done today.   DECREASE Spironolactone 12.5mg (1/2 tablet) by mouth once daily.  No other medication changes were made. Please continue all other medications as prescribed.   Your physician recommends that you schedule a follow-up appointment in: 2 months    You are scheduled for a TEE (Transesophageal Echocardiogram) on Wednesday May 26th, 2021 with Dr.   Please arrive at the [facility] (Main Entrance A) at [facility]: [place], [place] [address] at 8:30 am.   DIET: Nothing to eat or drink after midnight except a sip of water with medications (see medication instructions below)  Medication Instructions: Take ONLY ELIQUIS AND CARVEDILOL AT 6AM  THE MORNING OF YOUR PROCEDURE   You will need to continue your anticoagulant after your procedure  until you are told by your provider that it is safe to stop   Labs: a BMET and CBC will be done the morning of your procedure  You will need a pre procedure COVID test    WHEN:  Monday May 24th, 2021anytime between 9am-3pm WHERE: [facility](medical mall entrance)  This is a drive thru testing site, you will remain in your car. Be sure to get in the line FOR PROCEDURES Once you have been swabbed you will need to remain home in quarantine until you return for your procedure.   You must have a responsible person to drive you home and stay in the waiting area during your procedure. Failure to do so could result in cancellation.  Bring your insurance cards.  *Special Note: Every effort is made to have your procedure done on time. Occasionally there are emergencies that occur at the hospital that may cause delays. Please be patient if a delay does occur.

## 2020-05-07 NOTE — H&P (View-Only) (Signed)
PCP: Tracie Harrier, MD HF Cardiology: Dr. Aundra Dubin  86 y.o. with history of CAD s/p multiple PCIs, ischemic cardiomyopathy, sick sinus syndrome s/p PPM, and CKD stage 3 returns for followup of CHF and lightheadedness.   Patient has a long history of CAD detailed in the [place] below.  He also has an ischemic cardiomyopathy, with echo in 1/20 showing EF 40-45%. He had a Medtronic pacemaker placed for bradycardia in 2012. He was admitted with somewhat atypical chest pain in 5/20, TnI was mildly elevated at 0.08, with serial troponins there was no trend (maximal 0.09).  He underwent left heart cath, showing stable disease with no intervention planned (chronic occlusion of mid LCx and 85% ostial PDA).  His echo showed that EF was lower than prior at 20-25%.  He was thought to be volume overloaded.  He was diuresed with IV Lasix, and Coreg + Entresto were started.  Patient's BP fell significantly with initiation of cardiac meds and he had orthostatic symptoms.  All his cardiac meds had to be stopped.  There was concern that he may have low output HF and may require inotropic support, so he was transferred to [facility].  RHC at [facility] showed low filling pressures and normal cardiac output.  He continued to have dizziness and also cerebellar signs.  He was seen by neurology and suspected to have a cerebellar CVA.  CTA head showed multiple old small vessel infarcts, no MRI done due to PPM.  Eliquis was started based on neurology recommendation given low EF and CVA.  He gradually improved and was discharged home.   Echo was done in 10/20, ordered by neurology.  EF remained 20-25%, but aortic stenosis was reported as severe. I had not seen this echo prior but reviewed today. Calculated AVA < 1 but highest mean gradient obtained was 16 mmHg.  Based on appearance of valve, this certainly could be low gradient severe AS but valve not seen well.    Low dose DSE was done in 1/21.  This was suggestive of moderate AS  approaching severe.   Patient was noted to have a pancreatic mass on CT but PET scan was not suggestive of pancreatic cancer.  He does have prostate cancer and was treated with radiation, recently completed.   He returns today for followup of CHF and AS.   He has felt markedly worse since starting radiation treatment for his prostate cancer.  He is short of breath after walking 50 feet and fatigued.  Prior to radiation, he really had minimal dyspnea with exertion.  No chest pain.  No orthopnea/PND.  Weight is unchanged.   Labs (6/20): K 5.1 => 4.2, creatinine 1.46 => 1.33, LDL 75 Labs (12/20): K 4.2, creatinine 1.27 Labs (1/21):  Myeloma panel negative, urine IFXN negative, LDL 49 Labs (2/21): K 4.4, creatinine 1.54, hgb 13.6 Labs (4/21): hgb 12.4 Labs (5/21): K 5.2, creatinine 1.3  Past Medical History: 1. Low back pain/degenerative disc disease.  2. Sick sinus syndrome: Dual chamber Medtronic PPM placed 8/12.  3. Aortic stenosis: Mild to moderate on 5/20 echo.  - Echo in 10/20 with ?low gradient severe AS with mean gradient only 15 mmHg but calculated AVA 0.78 cm^2.  - Low dose DSE in 1/21: Moderate AS approaching severe. Rest mean gradient 13 with DVI 0.31 and AVA 1.1 cm^2 => peak stress mean gradient 34 mmHg with DVI 0.28 and AVA 1.0 cm^2.  4. CAD: Extensive history.  - 7/06: Inferior MI with Cypher DES to prox  and distal RCA.  - 07/20/05: Taxus DES to mLAD.  - 1/11: Xience DES to OM1 - 4/15: Overlapping Xience DES x 2 to ostial and proximal RCA.  - LHC (5/20): Nonobstructive disease in LAD, occluded mid LCx (chronic), 85% ostial PDA.  No intervention.  5. Chronic systolic CHF: Ischemic cardiomyopathy.   - Echo (1/20) with EF 40-45%.  - Echo (5/20) with EF 20-25%, normal RV, mild-moderate MR, mild-moderate AS with mean gradient 11 mmHg and AVA 1.47 cm^2.  - RHC (5/20): mean RA 3, PA 38/8 mean 18, mean PCWP 6, CI 2.68.  - Echo (10/20): EF 20-25%, possible low gradient severe AS.  - PYP  scan negative in 2/21.  6. CKD: Stage 3.  7. COPD: Remote smoking. 8. HTN 9. Hyperlipidemia 10. Depression 11. CVA: Suspected cerebellar CVA in 5/20.  Eliquis started.  - CTA head (unable to do MRI with PPM): <50% carotid stenosis bilaterally.  Multiple old small vessel infarcts.  12. Prostate Cancer: Radiation therapy, ending 5/21.  13. Pancreatic mass: Elevated CA 19-9.  PET scan was not suggestive of cancer.   Social History   Socioeconomic History  . Marital status: Widowed    Spouse name: Not on file  . Number of children: 1  . Years of education: Not on file  . Highest education level: Not on file  Occupational History  . Occupation: retired  Tobacco Use  . Smoking status: Former Smoker    Years: 10.00    Types: Cigarettes    Quit date: 1972    Years since quitting: 49.4  . Smokeless tobacco: Former Systems developer    Types: Chew  Substance and Sexual Activity  . Alcohol use: No  . Drug use: No  . Sexual activity: Not on file  Other Topics Concern  . Not on file  Social History Narrative  . Not on file   Social Determinants of Health   Financial Resource Strain: Low Risk   . Difficulty of Paying Living Expenses: Not very hard  Food Insecurity: No Food Insecurity  . Worried About Charity fundraiser in the Last Year: Never true  . Ran Out of Food in the Last Year: Never true  Transportation Needs: No Transportation Needs  . Lack of Transportation (Medical): No  . Lack of Transportation (Non-Medical): No  Physical Activity:   . Days of Exercise per Week:   . Minutes of Exercise per Session:   Stress: No Stress Concern Present  . Feeling of Stress : Only a little  Social Connections: Unknown  . Frequency of Communication with Friends and Family: More than three times a week  . Frequency of Social Gatherings with Friends and Family: Not on file  . Attends Religious Services: Not on file  . Active Member of Clubs or Organizations: Not on file  . Attends Theatre manager Meetings: Not on file  . Marital Status: Widowed  Intimate Partner Violence: Unknown  . Fear of Current or Ex-Partner: No  . Emotionally Abused: No  . Physically Abused: Not on file  . Sexually Abused: Not on file   Family History  Problem Relation Age of Onset  . Bladder Cancer Neg Hx   . Kidney cancer Neg Hx   . Prostate cancer Neg Hx    ROS: All systems reviewed and negative except as per HPI.   Current Outpatient Medications  Medication Sig Dispense Refill  . acetaminophen (TYLENOL) 500 MG tablet Take 1,000 mg by mouth every 6 (six) hours  as needed for mild pain or moderate pain.     Marland Kitchen apixaban (ELIQUIS) 5 MG TABS tablet Take 1 tablet (5 mg total) by mouth 2 (two) times daily. 60 tablet 6  . atorvastatin (LIPITOR) 80 MG tablet Take 80 mg by mouth daily at 6 PM.     . carvedilol (COREG) 6.25 MG tablet Take 1 tablet (6.25 mg total) by mouth 2 (two) times daily with a meal. 60 tablet 5  . furosemide (LASIX) 20 MG tablet Take 1 tablet (20 mg total) by mouth as needed (weight. Take one tablet if weight increases by 3 pounds overnight or 5 pounds in one week.). 30 tablet 0  . ipratropium-albuterol (DUONEB) 0.5-2.5 (3) MG/3ML SOLN Take 3 mLs by nebulization every 6 (six) hours as needed. 360 mL 1  . meclizine (ANTIVERT) 25 MG tablet Take 1 tablet (25 mg total) by mouth 3 (three) times daily as needed for dizziness. 30 tablet 0  . ranolazine (RANEXA) 500 MG 12 hr tablet Take 500 mg by mouth 2 (two) times daily.    Marland Kitchen spironolactone (ALDACTONE) 25 MG tablet Take 0.5 tablets (12.5 mg total) by mouth at bedtime. 45 tablet 3  . tamsulosin (FLOMAX) 0.4 MG CAPS capsule Take 1 capsule (0.4 mg total) by mouth daily after supper. 30 capsule 6  . venlafaxine XR (EFFEXOR-XR) 75 MG 24 hr capsule Take 1 capsule (75 mg total) by mouth daily with breakfast. 30 capsule 0   No current facility-administered medications for this encounter.     BP 110/60   Pulse 75   Wt 84.6 kg (186 lb 9.6  oz)   SpO2 97%   BMI 25.31 kg/m  General: NAD Neck: No JVD, no thyromegaly or thyroid nodule.  Lungs: Distant BS CV: Nondisplaced PMI.  Heart regular S1/S2, no S3/S4, 3/6 SEM RUSB.  No peripheral edema.  No carotid bruit.  Normal pedal pulses.  Abdomen: Soft, nontender, no hepatosplenomegaly, no distention.  Skin: Intact without lesions or rashes.  Neurologic: Alert and oriented x 3.  Psych: Normal affect. Extremities: No clubbing or cyanosis.  HEENT: Normal.    Assessment/Plan: 1. Chronic systolic CHF: Ischemic cardiomyopathy. Echo in 1/20 with EF 40-45%, but now down to 20-25% on echo from 5/20. The cause of the fall in EF is uncertain, he had coronary angiogram at [facility] in 5/20 with stable anatomy (no intervention).  He was been pacing his RV only 5% of the time when device was interrogated.  RHC in 5/20 showed low filling pressures and good cardiac output.  Echo in 10/20 showed stable EF 20-25% range.  BP has tended to run low with orthostatic hypotension when cardiac meds are added but doing well on current regimen. No evidence for amyloidosis with negative myeloma panel and urine IFXN as well as PYP scan negative.  Increased dyspnea recently, NYHA class III symptoms, but he is not volume overloaded on exam.   - Continue Coreg 6.25 mg bid.     - He does not appear to need a diuretic.  - Would hold off on ACEI/ARB/ARNI given orthostasis/soft BP.  - Continue spironolactone 12.5 daily.   2. CAD: Multiple interventions in the past.  Cath in 5/20 showed stable anatomy, no intervention. No chest pain.  - He was started on Eliquis per neurology due to CVA with low EF, now off ASA and Plavix.   - Continue atorvastatin 80 mg daily, good lipids in 1/21.   - Continue ranolazine.  - If severe AS confirmed, he  will need RHC/LHC prior to AVR.  3. CKD: Stage 3. Recent BMET stable.  4. Aortic stenosis: Mild to moderate on echo in 5/20.  On 10/20 echo, the valve was not well visualized by TTE but  certainly appearance could be consistent with severe AS.  AVA 0.78 cm^2 with mean gradient only 15 mmHg.  ?Low gradient severe AS.  Low dose DSE was done, this was suggestive of moderate but nearing severe AS.  He now has had marked worsening of dyspnea in absence of volume overload.  He has not been significantly anemic.  Radiation for prostate cancer likely plays a role in this symptomatology, but I am concerned that symptoms come in part from AS.  - I will arrange for TEE to more closely assess aortic valve.  We discussed risks and benefits, he agrees to procedure.  - If AS looks severe, will arrange for RHC/LHC and refer for TAVR.    5. Suspected cerebellar CVA: In 5/20.  Dizziness/?vertigo with dysmetria and diplopia on exam.  Seen by neurology and felt to have cerebellar stroke, repeat CT head with no acute changes (old small vessel infarcts). Carotid u/s 1-39% bilaterally. Vertebrals ok.  No MRI due to PPM.  Had CTA head/neck without large vessel occlusion or stenosis.  - He was started on apixaban and stopped ASA/Plavix with low EF.   6. Sick sinus syndrome: MDT dual chamber pacemaker, RV pacing appears rare.  - Arrange pacemaker followup.   7. Prostate cancer: He has just completed radiation therapy.   Loralie Champagne 05/07/2020

## 2020-05-12 ENCOUNTER — Other Ambulatory Visit: Payer: Self-pay

## 2020-05-12 ENCOUNTER — Other Ambulatory Visit (HOSPITAL_COMMUNITY): Payer: Self-pay

## 2020-05-12 ENCOUNTER — Other Ambulatory Visit
Admission: RE | Admit: 2020-05-12 | Discharge: 2020-05-12 | Disposition: A | Discharge status: Home / Self Care | Payer: Medicare Other | Source: Ambulatory Visit | Attending: Cardiology | Admitting: Cardiology

## 2020-05-12 DIAGNOSIS — Z20822 Contact with and (suspected) exposure to covid-19: Secondary | ICD-10-CM | POA: Insufficient documentation

## 2020-05-12 DIAGNOSIS — Z01812 Encounter for preprocedural laboratory examination: Secondary | ICD-10-CM | POA: Insufficient documentation

## 2020-05-12 DIAGNOSIS — I350 Nonrheumatic aortic (valve) stenosis: Secondary | ICD-10-CM

## 2020-05-13 ENCOUNTER — Encounter (HOSPITAL_COMMUNITY): Payer: Self-pay

## 2020-05-13 LAB — SARS CORONAVIRUS 2 (TAT 6-24 HRS): SARS Coronavirus 2: NEGATIVE

## 2020-05-14 ENCOUNTER — Encounter (HOSPITAL_COMMUNITY): Payer: Self-pay | Admitting: Cardiology

## 2020-05-14 ENCOUNTER — Ambulatory Visit (HOSPITAL_COMMUNITY): Payer: Medicare Other | Admitting: Anesthesiology

## 2020-05-14 ENCOUNTER — Ambulatory Visit (HOSPITAL_BASED_OUTPATIENT_CLINIC_OR_DEPARTMENT_OTHER): Payer: Medicare Other

## 2020-05-14 ENCOUNTER — Ambulatory Visit (HOSPITAL_COMMUNITY)
Admission: RE | Admit: 2020-05-14 | Discharge: 2020-05-14 | Disposition: A | Discharge status: Home / Self Care | Payer: Medicare Other | Attending: Cardiology | Admitting: Cardiology

## 2020-05-14 ENCOUNTER — Encounter (HOSPITAL_COMMUNITY): Payer: Self-pay

## 2020-05-14 ENCOUNTER — Telehealth (HOSPITAL_COMMUNITY): Payer: Self-pay

## 2020-05-14 ENCOUNTER — Encounter (HOSPITAL_COMMUNITY)
Admission: RE | Disposition: A | Discharge status: Home / Self Care | Payer: Self-pay | Source: Home / Self Care | Attending: Cardiology

## 2020-05-14 DIAGNOSIS — I5022 Chronic systolic (congestive) heart failure: Secondary | ICD-10-CM | POA: Insufficient documentation

## 2020-05-14 DIAGNOSIS — I420 Dilated cardiomyopathy: Secondary | ICD-10-CM | POA: Diagnosis not present

## 2020-05-14 DIAGNOSIS — C61 Malignant neoplasm of prostate: Secondary | ICD-10-CM | POA: Insufficient documentation

## 2020-05-14 DIAGNOSIS — J449 Chronic obstructive pulmonary disease, unspecified: Secondary | ICD-10-CM | POA: Diagnosis not present

## 2020-05-14 DIAGNOSIS — Z950 Presence of cardiac pacemaker: Secondary | ICD-10-CM | POA: Insufficient documentation

## 2020-05-14 DIAGNOSIS — Z7901 Long term (current) use of anticoagulants: Secondary | ICD-10-CM | POA: Insufficient documentation

## 2020-05-14 DIAGNOSIS — E785 Hyperlipidemia, unspecified: Secondary | ICD-10-CM | POA: Insufficient documentation

## 2020-05-14 DIAGNOSIS — I252 Old myocardial infarction: Secondary | ICD-10-CM | POA: Diagnosis not present

## 2020-05-14 DIAGNOSIS — I255 Ischemic cardiomyopathy: Secondary | ICD-10-CM | POA: Insufficient documentation

## 2020-05-14 DIAGNOSIS — F329 Major depressive disorder, single episode, unspecified: Secondary | ICD-10-CM | POA: Diagnosis not present

## 2020-05-14 DIAGNOSIS — Z79899 Other long term (current) drug therapy: Secondary | ICD-10-CM | POA: Diagnosis not present

## 2020-05-14 DIAGNOSIS — I2510 Atherosclerotic heart disease of native coronary artery without angina pectoris: Secondary | ICD-10-CM | POA: Diagnosis not present

## 2020-05-14 DIAGNOSIS — I130 Hypertensive heart and chronic kidney disease with heart failure and stage 1 through stage 4 chronic kidney disease, or unspecified chronic kidney disease: Secondary | ICD-10-CM | POA: Diagnosis not present

## 2020-05-14 DIAGNOSIS — I350 Nonrheumatic aortic (valve) stenosis: Secondary | ICD-10-CM | POA: Diagnosis not present

## 2020-05-14 DIAGNOSIS — Z8673 Personal history of transient ischemic attack (TIA), and cerebral infarction without residual deficits: Secondary | ICD-10-CM | POA: Diagnosis not present

## 2020-05-14 DIAGNOSIS — I495 Sick sinus syndrome: Secondary | ICD-10-CM | POA: Insufficient documentation

## 2020-05-14 DIAGNOSIS — Z7902 Long term (current) use of antithrombotics/antiplatelets: Secondary | ICD-10-CM | POA: Diagnosis not present

## 2020-05-14 DIAGNOSIS — Z87891 Personal history of nicotine dependence: Secondary | ICD-10-CM | POA: Diagnosis not present

## 2020-05-14 DIAGNOSIS — N183 Chronic kidney disease, stage 3 unspecified: Secondary | ICD-10-CM | POA: Insufficient documentation

## 2020-05-14 HISTORY — PX: TEE WITHOUT CARDIOVERSION: SHX5443

## 2020-05-14 SURGERY — ECHOCARDIOGRAM, TRANSESOPHAGEAL
Anesthesia: Monitor Anesthesia Care

## 2020-05-14 MED ORDER — EPHEDRINE SULFATE-NACL 50-0.9 MG/10ML-% IV SOSY
PREFILLED_SYRINGE | INTRAVENOUS | Status: DC | PRN
  Administered 2020-05-14 (×5): 10 mg via INTRAVENOUS

## 2020-05-14 MED ORDER — LIDOCAINE HCL (CARDIAC) PF 100 MG/5ML IV SOSY
PREFILLED_SYRINGE | INTRAVENOUS | Status: DC | PRN
  Administered 2020-05-14: 40 mg via INTRAVENOUS

## 2020-05-14 MED ORDER — LACTATED RINGERS IV SOLN: INTRAVENOUS | Status: DC

## 2020-05-14 MED ORDER — SODIUM CHLORIDE 0.9 % IV SOLN: INTRAVENOUS | Status: DC

## 2020-05-14 MED ORDER — PROPOFOL 500 MG/50ML IV EMUL
INTRAVENOUS | Status: DC | PRN
  Administered 2020-05-14: 100 ug/kg/min via INTRAVENOUS

## 2020-05-14 MED ORDER — PROPOFOL 10 MG/ML IV BOLUS
INTRAVENOUS | Status: DC | PRN
  Administered 2020-05-14: 10 mg via INTRAVENOUS
  Administered 2020-05-14 (×3): 20 mg via INTRAVENOUS

## 2020-05-14 NOTE — Progress Notes (Signed)
  Echocardiogram Echocardiogram Transesophageal has been performed.  Mychele Seyller A Graydon Fofana 05/14/2020, 12:53 PM

## 2020-05-14 NOTE — Transfer of Care (Signed)
Immediate Anesthesia Transfer of Care Note  Patient: Kristopher Fernandez  Procedure(s) Performed: TRANSESOPHAGEAL ECHOCARDIOGRAM (TEE) (N/A )  Patient Location: Endoscopy Unit  Anesthesia Type:MAC  Level of Consciousness: awake, alert , oriented and patient cooperative  Airway & Oxygen Therapy: Patient Spontanous Breathing and Patient connected to face mask oxygen  Post-op Assessment: Report given to RN, Post -op Vital signs reviewed and stable and patient A&O despite low BP  Post vital signs: Reviewed and stable  Last Vitals:  Vitals Value Taken Time  BP 89/35 05/14/20 1037  Temp 36.5 C 05/14/20 1035  Pulse 42 05/14/20 1038  Resp 18 05/14/20 1038  SpO2 100 % 05/14/20 1038  Vitals shown include unvalidated device data.  Last Pain:  Vitals:   05/14/20 1035  TempSrc: Temporal  PainSc: 0-No pain         Complications: No apparent anesthesia complications

## 2020-05-14 NOTE — Interval H&P Note (Signed)
History and Physical Interval Note:  05/14/2020 10:05 AM  Kristopher Fernandez  has presented today for surgery, with the diagnosis of AORTIC STENOSIS.  The various methods of treatment have been discussed with the patient and family. After consideration of risks, benefits and other options for treatment, the patient has consented to  Procedure(s): TRANSESOPHAGEAL ECHOCARDIOGRAM (TEE) (N/A) as a surgical intervention.  The patient's history has been reviewed, patient examined, no change in status, stable for surgery.  I have reviewed the patient's chart and labs.  Questions were answered to the patient's satisfaction.     Dal[place] [employer]

## 2020-05-14 NOTE — CV Procedure (Signed)
Procedure: TEE  Indication: Assess severity of aortic stenosis.    Sedation: Per anesthesiology.   Findings: Please see echo section for full report.  Suspect low gradient severe AS, will refer to structural heart clinic for evaluation.  He will need right/left heart cath.   Kristopher Fernandez 05/14/2020 10:27 AM

## 2020-05-14 NOTE — Discharge Instructions (Signed)

## 2020-05-14 NOTE — Telephone Encounter (Signed)
Patient scheduled for Left and Heart Cath for 6/3 with covid screen and labs on 6/1.  Spoke with daughter Kristopher Fernandez and made her aware of same. Instructions reviewed. Voiced understanding     [facility] [place] [id number] [place] [address] Dept: [phone] Loc: [phone]  Kristopher Fernandez  05/14/2020  You are scheduled for a Cardiac Catheterization on Thursday, June 3 with Dr. Loralie Champagne.  1. Please arrive at the [facility] (Main Entrance A) at [facility]: [address] [place], [place] [address] at 7:00 AM (This time is two hours before your procedure to ensure your preparation). Free valet parking service is available.   Special note: Every effort is made to have your procedure done on time. Please understand that emergencies sometimes delay scheduled procedures.  2. Diet: Do not eat or drink after midnight  3. Labs: at [facility] lab on 05/20/2020  You will need a pre procedure COVID test    WHEN:  Tuesday June 1st, 2021 at 8:05AM WHERE: [facility] labs  This is a drive thru testing site, you will remain in your car. Be sure to get in the line FOR PROCEDURES Once you have been swabbed you will need to remain home in quarantine until you return for your procedure.   4. Medication instructions in preparation for your procedure:   Contrast Allergy: No  HOLD Eliquis on Tuesday 6/1, Wednesday 6/2 and day the of procedure  HOLD ALL MEDICATIONS ON THE MORNING OF THE PROCEDURE.  5. Plan for one night stay--bring personal belongings. 6. Bring a current list of your medications and current insurance cards. 7. You MUST have a responsible person to drive you home. 8. Someone MUST be with you the first 24 hours after you arrive home or your discharge will be delayed. 9. Please wear clothes that are easy to get on and off and wear slip-on shoes.  Thank you for allowing [place] to care for you!   --  [facility]

## 2020-05-14 NOTE — Anesthesia Preprocedure Evaluation (Signed)
Anesthesia Evaluation  Patient identified by MRN, date of birth, ID band Patient awake    Reviewed: Allergy & Precautions, NPO status , Patient's Chart, lab work & pertinent test results  Airway Mallampati: II  TM Distance: >3 FB     Dental   Pulmonary former smoker,    breath sounds clear to auscultation       Cardiovascular hypertension, + angina + Past MI and +CHF  + pacemaker  Rhythm:Regular Rate:Normal     Neuro/Psych    GI/Hepatic negative GI ROS, Neg liver ROS,   Endo/Other    Renal/GU Renal disease     Musculoskeletal   Abdominal   Peds  Hematology  (+) anemia ,   Anesthesia Other Findings   Reproductive/Obstetrics                             Anesthesia Physical Anesthesia Plan  ASA: III  Anesthesia Plan: General   Post-op Pain Management:    Induction: Intravenous  PONV Risk Score and Plan: 2 and Treatment may vary due to age or medical condition  Airway Management Planned: Simple Face Mask  Additional Equipment:   Intra-op Plan:   Post-operative Plan:   Informed Consent: I have reviewed the patients History and Physical, chart, labs and discussed the procedure including the risks, benefits and alternatives for the proposed anesthesia with the patient or authorized representative who has indicated his/her understanding and acceptance.     Dental advisory given  Plan Discussed with: CRNA and Anesthesiologist  Anesthesia Plan Comments:         Anesthesia Quick Evaluation

## 2020-05-14 NOTE — Anesthesia Postprocedure Evaluation (Signed)
Anesthesia Post Note  Patient: Kristopher Fernandez  Procedure(s) Performed: TRANSESOPHAGEAL ECHOCARDIOGRAM (TEE) (N/A )     Patient location during evaluation: Endoscopy Anesthesia Type: General Level of consciousness: awake Pain management: pain level controlled Vital Signs Assessment: post-procedure vital signs reviewed and stable Respiratory status: spontaneous breathing Cardiovascular status: stable Postop Assessment: no apparent nausea or vomiting Anesthetic complications: no    Last Vitals:  Vitals:   05/14/20 1055 05/14/20 1100  BP: (!) 115/40 (!) 106/52  Pulse: (!) 35 (!) 35  Resp: 15 17  Temp:    SpO2: 98% 97%    Last Pain:  Vitals:   05/14/20 1100  TempSrc:   PainSc: 0-No pain                 Kiersten Coss

## 2020-05-14 NOTE — Telephone Encounter (Signed)
-----   Message from Larey Dresser, MD sent at 05/14/2020 10:34 AM EDT ----- Needs left and right heart cath for aortic stenosis pre-TAVR.  Will need BMET, CBC, INR drawn prior.  Would arrange for me for next week if possible.  Thanks.

## 2020-05-15 ENCOUNTER — Other Ambulatory Visit (HOSPITAL_COMMUNITY): Payer: Self-pay

## 2020-05-15 DIAGNOSIS — I5022 Chronic systolic (congestive) heart failure: Secondary | ICD-10-CM

## 2020-05-15 DIAGNOSIS — I350 Nonrheumatic aortic (valve) stenosis: Secondary | ICD-10-CM

## 2020-05-15 MED ORDER — SODIUM CHLORIDE 0.9% FLUSH: 3.0000 mL | Freq: Two times a day (BID) | INTRAVENOUS | Status: DC

## 2020-05-16 NOTE — Telephone Encounter (Signed)
Left message to call back  

## 2020-05-20 ENCOUNTER — Other Ambulatory Visit
Admission: RE | Admit: 2020-05-20 | Discharge: 2020-05-20 | Disposition: A | Discharge status: Home / Self Care | Payer: Medicare Other | Source: Ambulatory Visit | Attending: Cardiology | Admitting: Cardiology

## 2020-05-20 ENCOUNTER — Other Ambulatory Visit: Payer: Self-pay

## 2020-05-20 DIAGNOSIS — Z01812 Encounter for preprocedural laboratory examination: Secondary | ICD-10-CM | POA: Insufficient documentation

## 2020-05-20 DIAGNOSIS — Z20822 Contact with and (suspected) exposure to covid-19: Secondary | ICD-10-CM | POA: Diagnosis not present

## 2020-05-20 NOTE — Telephone Encounter (Signed)
Called and spoke with [place]. Rescheduled the patient to 6/9 with Dr. Angelena Form. She was grateful for assistance.

## 2020-05-21 ENCOUNTER — Telehealth: Payer: Self-pay | Admitting: Cardiovascular Disease

## 2020-05-21 ENCOUNTER — Other Ambulatory Visit
Admission: RE | Admit: 2020-05-21 | Discharge: 2020-05-21 | Disposition: A | Discharge status: Home / Self Care | Payer: Medicare Other | Attending: Cardiology | Admitting: Cardiology

## 2020-05-21 ENCOUNTER — Other Ambulatory Visit: Payer: Self-pay

## 2020-05-21 ENCOUNTER — Telehealth: Payer: Self-pay

## 2020-05-21 ENCOUNTER — Other Ambulatory Visit (HOSPITAL_COMMUNITY): Payer: Self-pay

## 2020-05-21 DIAGNOSIS — I350 Nonrheumatic aortic (valve) stenosis: Secondary | ICD-10-CM

## 2020-05-21 DIAGNOSIS — I5022 Chronic systolic (congestive) heart failure: Secondary | ICD-10-CM | POA: Insufficient documentation

## 2020-05-21 LAB — BASIC METABOLIC PANEL
Anion gap: 8 (ref 5–15)
BUN: 24 mg/dL — ABNORMAL HIGH (ref 8–23)
CO2: 27 mmol/L (ref 22–32)
Calcium: 9.2 mg/dL (ref 8.9–10.3)
Chloride: 104 mmol/L (ref 98–111)
Creatinine, Ser: 1.3 mg/dL — ABNORMAL HIGH (ref 0.61–1.24)
GFR calc Af Amer: 58 mL/min — ABNORMAL LOW (ref >60)
GFR calc non Af Amer: 50 mL/min — ABNORMAL LOW (ref >60)
Glucose, Bld: 99 mg/dL (ref 70–99)
Potassium: 4.7 mmol/L (ref 3.5–5.1)
Sodium: 139 mmol/L (ref 135–145)

## 2020-05-21 LAB — CBC
HCT: 32.2 % — ABNORMAL LOW (ref 39.0–52.0)
Hemoglobin: 11.3 g/dL — ABNORMAL LOW (ref 13.0–17.0)
MCH: 31.9 pg (ref 26.0–34.0)
MCHC: 35.1 g/dL (ref 30.0–36.0)
MCV: 91 fL (ref 80.0–100.0)
Platelets: 123 10*3/uL — ABNORMAL LOW (ref 150–400)
RBC: 3.54 MIL/uL — ABNORMAL LOW (ref 4.22–5.81)
RDW: 14.2 % (ref 11.5–15.5)
WBC: 4.7 10*3/uL (ref 4.0–10.5)
nRBC: 0 % (ref 0.0–0.2)

## 2020-05-21 LAB — PROTIME-INR
INR: 1.1 (ref 0.8–1.2)
Prothrombin Time: 14.1 seconds (ref 11.4–15.2)

## 2020-05-21 LAB — SARS CORONAVIRUS 2 (TAT 6-24 HRS): SARS Coronavirus 2: NEGATIVE

## 2020-05-21 NOTE — Telephone Encounter (Signed)
Pt contacted pre-catheterization scheduled at [facility] for: 05/22/20 Verified arrival time and place: [facility]) at: 7 am.   Pt has not had his labs drawn due 05/20/20 so he will go this morning and have drawn ASAP.   No solid food after midnight prior to cath, clear liquids until 5 AM day of procedure. Contrast allergy: NO  HOLD ALL AM MEDS PER MD. ONLY AM med to be taken pre-cath with sip of water: ASA 81 mg  Confirmed patient has responsible adult to drive home post procedure and observe 24 hours after arriving home:   You are allowed ONE visitor in the waiting room during your procedure. Both you and your visitor must wear masks.      COVID-19 Pre-Screening Questions:   In the past 7 to 10 days have you had a cough,  shortness of breath, headache, congestion, fever (100 or greater) body aches, chills, sore throat, or sudden loss of taste or sense of smell? NO  Have you been around anyone with known Covid 19 in the past 7 to 10 days? NO  Have you been around anyone who is awaiting Covid 19 test results in the past 7 to 10 days? NO  Have you been around anyone who has mentioned symptoms of Covid 19 within the past 7 to 10 days? NO

## 2020-05-21 NOTE — Addendum Note (Signed)
Addended by: Santiago Bur on: 05/21/2020 11:56 AM   Modules accepted: Orders

## 2020-05-21 NOTE — Addendum Note (Signed)
Addended by: Santiago Bur on: 05/21/2020 11:55 AM   Modules accepted: Orders

## 2020-05-21 NOTE — Telephone Encounter (Signed)
Called patient to let him know to have labs drawn as soon as possible as they were not done yesterday.  Pt will go this morning to have them done. Daughter [name] made aware of same and will f/u with patient. Orders placed as STAT

## 2020-05-21 NOTE — Telephone Encounter (Signed)
Patient calling stating he was returning a call from Dr. Algernon Huxley office. I informed him we do not have a Dr. Marina Gravel in our office. Patient verbalized understanding.

## 2020-05-22 ENCOUNTER — Encounter (HOSPITAL_COMMUNITY)
Admission: RE | Disposition: A | Discharge status: Home / Self Care | Payer: Self-pay | Source: Home / Self Care | Attending: Cardiology

## 2020-05-22 ENCOUNTER — Other Ambulatory Visit: Payer: Self-pay

## 2020-05-22 ENCOUNTER — Ambulatory Visit (HOSPITAL_COMMUNITY)
Admission: RE | Admit: 2020-05-22 | Discharge: 2020-05-22 | Disposition: A | Discharge status: Home / Self Care | Payer: Medicare Other | Attending: Cardiology | Admitting: Cardiology

## 2020-05-22 DIAGNOSIS — F329 Major depressive disorder, single episode, unspecified: Secondary | ICD-10-CM | POA: Insufficient documentation

## 2020-05-22 DIAGNOSIS — Z950 Presence of cardiac pacemaker: Secondary | ICD-10-CM | POA: Insufficient documentation

## 2020-05-22 DIAGNOSIS — I130 Hypertensive heart and chronic kidney disease with heart failure and stage 1 through stage 4 chronic kidney disease, or unspecified chronic kidney disease: Secondary | ICD-10-CM | POA: Diagnosis not present

## 2020-05-22 DIAGNOSIS — I255 Ischemic cardiomyopathy: Secondary | ICD-10-CM | POA: Insufficient documentation

## 2020-05-22 DIAGNOSIS — I495 Sick sinus syndrome: Secondary | ICD-10-CM | POA: Insufficient documentation

## 2020-05-22 DIAGNOSIS — Z955 Presence of coronary angioplasty implant and graft: Secondary | ICD-10-CM | POA: Insufficient documentation

## 2020-05-22 DIAGNOSIS — E785 Hyperlipidemia, unspecified: Secondary | ICD-10-CM | POA: Insufficient documentation

## 2020-05-22 DIAGNOSIS — C61 Malignant neoplasm of prostate: Secondary | ICD-10-CM | POA: Diagnosis not present

## 2020-05-22 DIAGNOSIS — I350 Nonrheumatic aortic (valve) stenosis: Secondary | ICD-10-CM

## 2020-05-22 DIAGNOSIS — Z79899 Other long term (current) drug therapy: Secondary | ICD-10-CM | POA: Diagnosis not present

## 2020-05-22 DIAGNOSIS — I272 Pulmonary hypertension, unspecified: Secondary | ICD-10-CM | POA: Diagnosis not present

## 2020-05-22 DIAGNOSIS — Z87891 Personal history of nicotine dependence: Secondary | ICD-10-CM | POA: Insufficient documentation

## 2020-05-22 DIAGNOSIS — Z7901 Long term (current) use of anticoagulants: Secondary | ICD-10-CM | POA: Diagnosis not present

## 2020-05-22 DIAGNOSIS — I5022 Chronic systolic (congestive) heart failure: Secondary | ICD-10-CM | POA: Diagnosis not present

## 2020-05-22 DIAGNOSIS — Z923 Personal history of irradiation: Secondary | ICD-10-CM | POA: Insufficient documentation

## 2020-05-22 DIAGNOSIS — I252 Old myocardial infarction: Secondary | ICD-10-CM | POA: Insufficient documentation

## 2020-05-22 DIAGNOSIS — N183 Chronic kidney disease, stage 3 unspecified: Secondary | ICD-10-CM | POA: Diagnosis not present

## 2020-05-22 DIAGNOSIS — J449 Chronic obstructive pulmonary disease, unspecified: Secondary | ICD-10-CM | POA: Diagnosis not present

## 2020-05-22 DIAGNOSIS — I2510 Atherosclerotic heart disease of native coronary artery without angina pectoris: Secondary | ICD-10-CM | POA: Insufficient documentation

## 2020-05-22 HISTORY — PX: RIGHT HEART CATH AND CORONARY ANGIOGRAPHY: CATH118264

## 2020-05-22 LAB — POCT I-STAT EG7
Acid-base deficit: 3 mmol/L — ABNORMAL HIGH (ref 0.0–2.0)
Acid-base deficit: 1 mmol/L (ref 0.0–2.0)
Bicarbonate: 23 mmol/L (ref 20.0–28.0)
Bicarbonate: 24.7 mmol/L (ref 20.0–28.0)
Calcium, Ion: 1.18 mmol/L (ref 1.15–1.40)
Calcium, Ion: 1.25 mmol/L (ref 1.15–1.40)
HCT: 28 % — ABNORMAL LOW (ref 39.0–52.0)
HCT: 27 % — ABNORMAL LOW (ref 39.0–52.0)
Hemoglobin: 9.5 g/dL — ABNORMAL LOW (ref 13.0–17.0)
Hemoglobin: 9.2 g/dL — ABNORMAL LOW (ref 13.0–17.0)
O2 Saturation: 63 %
O2 Saturation: 64 %
Potassium: 3.8 mmol/L (ref 3.5–5.1)
Potassium: 4 mmol/L (ref 3.5–5.1)
Sodium: 140 mmol/L (ref 135–145)
Sodium: 139 mmol/L (ref 135–145)
TCO2: 24 mmol/L (ref 22–32)
TCO2: 26 mmol/L (ref 22–32)
pCO2, Ven: 45.6 mmHg (ref 44.0–60.0)
pCO2, Ven: 46.9 mmHg (ref 44.0–60.0)
pH, Ven: 7.311 (ref 7.250–7.430)
pH, Ven: 7.329 (ref 7.250–7.430)
pO2, Ven: 36 mmHg (ref 32.0–45.0)
pO2, Ven: 36 mmHg (ref 32.0–45.0)

## 2020-05-22 SURGERY — RIGHT HEART CATH AND CORONARY ANGIOGRAPHY
Anesthesia: LOCAL

## 2020-05-22 MED ORDER — SODIUM CHLORIDE 0.9% FLUSH: 3.0000 mL | Freq: Two times a day (BID) | INTRAVENOUS | Status: DC

## 2020-05-22 MED ORDER — LABETALOL HCL 5 MG/ML IV SOLN: 10.0000 mg | INTRAVENOUS | Status: DC | PRN

## 2020-05-22 MED ORDER — IOHEXOL 350 MG/ML SOLN
INTRAVENOUS | Status: DC | PRN
  Administered 2020-05-22: 90 mL

## 2020-05-22 MED ORDER — ONDANSETRON HCL 4 MG/2ML IJ SOLN: 4.0000 mg | Freq: Four times a day (QID) | INTRAMUSCULAR | Status: DC | PRN

## 2020-05-22 MED ORDER — SODIUM CHLORIDE 0.9 % IV SOLN: INTRAVENOUS | Status: DC

## 2020-05-22 MED ORDER — FENTANYL CITRATE (PF) 100 MCG/2ML IJ SOLN
INTRAMUSCULAR | Status: AC
  Filled 2020-05-22: qty 2

## 2020-05-22 MED ORDER — VERAPAMIL HCL 2.5 MG/ML IV SOLN
INTRAVENOUS | Status: DC | PRN
  Administered 2020-05-22: 10 mL via INTRA_ARTERIAL

## 2020-05-22 MED ORDER — SODIUM CHLORIDE 0.9% FLUSH: 3.0000 mL | INTRAVENOUS | Status: DC | PRN

## 2020-05-22 MED ORDER — HEPARIN (PORCINE) IN NACL 1000-0.9 UT/500ML-% IV SOLN
INTRAVENOUS | Status: DC | PRN
  Administered 2020-05-22 (×2): 500 mL

## 2020-05-22 MED ORDER — LIDOCAINE HCL (PF) 1 % IJ SOLN
INTRAMUSCULAR | Status: AC
  Filled 2020-05-22: qty 30

## 2020-05-22 MED ORDER — FENTANYL CITRATE (PF) 100 MCG/2ML IJ SOLN
INTRAMUSCULAR | Status: DC | PRN
  Administered 2020-05-22: 25 ug via INTRAVENOUS

## 2020-05-22 MED ORDER — HEPARIN (PORCINE) IN NACL 1000-0.9 UT/500ML-% IV SOLN
INTRAVENOUS | Status: AC
  Filled 2020-05-22: qty 1000

## 2020-05-22 MED ORDER — SODIUM CHLORIDE 0.9 % IV SOLN: 250.0000 mL | INTRAVENOUS | Status: DC | PRN

## 2020-05-22 MED ORDER — ASPIRIN 81 MG PO CHEW: 81.0000 mg | CHEWABLE_TABLET | ORAL | Status: DC

## 2020-05-22 MED ORDER — MIDAZOLAM HCL 2 MG/2ML IJ SOLN
INTRAMUSCULAR | Status: DC | PRN
  Administered 2020-05-22: 1 mg via INTRAVENOUS

## 2020-05-22 MED ORDER — HEPARIN SODIUM (PORCINE) 1000 UNIT/ML IJ SOLN
INTRAMUSCULAR | Status: AC
  Filled 2020-05-22: qty 1

## 2020-05-22 MED ORDER — ASPIRIN 81 MG PO CHEW
81.0000 mg | CHEWABLE_TABLET | ORAL | Status: AC
  Administered 2020-05-22: 81 mg via ORAL
  Filled 2020-05-22: qty 1

## 2020-05-22 MED ORDER — HEPARIN SODIUM (PORCINE) 1000 UNIT/ML IJ SOLN
INTRAMUSCULAR | Status: DC | PRN
  Administered 2020-05-22: 4000 [IU] via INTRAVENOUS

## 2020-05-22 MED ORDER — LIDOCAINE HCL (PF) 1 % IJ SOLN
INTRAMUSCULAR | Status: DC | PRN
  Administered 2020-05-22 (×2): 2 mL

## 2020-05-22 MED ORDER — MIDAZOLAM HCL 2 MG/2ML IJ SOLN
INTRAMUSCULAR | Status: AC
  Filled 2020-05-22: qty 2

## 2020-05-22 MED ORDER — HYDRALAZINE HCL 20 MG/ML IJ SOLN: 10.0000 mg | INTRAMUSCULAR | Status: DC | PRN

## 2020-05-22 MED ORDER — VERAPAMIL HCL 2.5 MG/ML IV SOLN
INTRAVENOUS | Status: AC
  Filled 2020-05-22: qty 2

## 2020-05-22 MED ORDER — ACETAMINOPHEN 325 MG PO TABS: 650.0000 mg | ORAL_TABLET | ORAL | Status: DC | PRN

## 2020-05-22 SURGICAL SUPPLY — 11 items
CATH 5FR JL3.5 JR4 ANG PIG MP (CATHETERS) ×1 IMPLANT
CATH BALLN WEDGE 5F 110CM (CATHETERS) ×1 IMPLANT
DEVICE RAD COMP TR BAND LRG (VASCULAR PRODUCTS) ×1 IMPLANT
GLIDESHEATH SLEND SS 6F .021 (SHEATH) ×1 IMPLANT
GUIDEWIRE .025 260CM (WIRE) ×1 IMPLANT
GUIDEWIRE INQWIRE 1.5J.035X260 (WIRE) IMPLANT
INQWIRE 1.5J .035X260CM (WIRE) ×2
KIT HEART LEFT (KITS) ×2 IMPLANT
PACK CARDIAC CATHETERIZATION (CUSTOM PROCEDURE TRAY) ×2 IMPLANT
SHEATH GLIDE SLENDER 4/5FR (SHEATH) ×2 IMPLANT
TRANSDUCER W/STOPCOCK (MISCELLANEOUS) ×2 IMPLANT

## 2020-05-22 NOTE — Discharge Instructions (Signed)
Radial Site Care  This sheet gives you information about how to care for yourself after your procedure. Your health care provider may also give you more specific instructions. If you have problems or questions, contact your health care provider. What can I expect after the procedure? After the procedure, it is common to have:  Bruising and tenderness at the catheter insertion area. Follow these instructions at home: Medicines  Take over-the-counter and prescription medicines only as told by your health care provider. Insertion site care  Follow instructions from your health care provider about how to take care of your insertion site. Make sure you: ? Wash your hands with soap and water before you change your bandage (dressing). If soap and water are not available, use hand sanitizer. ? Change your dressing as told by your health care provider. ? Leave stitches (sutures), skin glue, or adhesive strips in place. These skin closures may need to stay in place for 2 weeks or longer. If adhesive strip edges start to loosen and curl up, you may trim the loose edges. Do not remove adhesive strips completely unless your health care provider tells you to do that.  Check your insertion site every day for signs of infection. Check for: ? Redness, swelling, or pain. ? Fluid or blood. ? Pus or a bad smell. ? Warmth.  Do not take baths, swim, or use a hot tub until your health care provider approves.  You may shower 24-48 hours after the procedure, or as directed by your health care provider. ? Remove the dressing and gently wash the site with plain soap and water. ? Pat the area dry with a clean towel. ? Do not rub the site. That could cause bleeding.  Do not apply powder or lotion to the site. Activity   For 24 hours after the procedure, or as directed by your health care provider: ? Do not flex or bend the affected arm. ? Do not push or pull heavy objects with the affected arm. ? Do not  drive yourself home from the hospital or clinic. You may drive 24 hours after the procedure unless your health care provider tells you not to. ? Do not operate machinery or power tools.  Do not lift anything that is heavier than 10 lb (4.5 kg), or the limit that you are told, until your health care provider says that it is safe.  Ask your health care provider when it is okay to: ? Return to work or school. ? Resume usual physical activities or sports. ? Resume sexual activity. General instructions  If the catheter site starts to bleed, raise your arm and put firm pressure on the site. If the bleeding does not stop, get help right away. This is a medical emergency.  If you went home on the same day as your procedure, a responsible adult should be with you for the first 24 hours after you arrive home.  Keep all follow-up visits as told by your health care provider. This is important. Contact a health care provider if:  You have a fever.  You have redness, swelling, or yellow drainage around your insertion site. Get help right away if:  You have unusual pain at the radial site.  The catheter insertion area swells very fast.  The insertion area is bleeding, and the bleeding does not stop when you hold steady pressure on the area.  Your arm or hand becomes pale, cool, tingly, or numb. These symptoms may represent a serious problem   that is an emergency. Do not wait to see if the symptoms will go away. Get medical help right away. Call your local emergency services (911 in the [place] Do not drive yourself to the hospital. Summary  After the procedure, it is common to have bruising and tenderness at the site.  Follow instructions from your health care provider about how to take care of your radial site wound. Check the wound every day for signs of infection.  Do not lift anything that is heavier than 10 lb (4.5 kg), or the limit that you are told, until your health care provider says  that it is safe. This information is not intended to replace advice given to you by your health care provider. Make sure you discuss any questions you have with your health care provider. Document Revised: 01/11/2018 Document Reviewed: 01/11/2018 Elsevier Patient Education  2020 [employer] Inc[employer]  

## 2020-05-22 NOTE — Interval H&P Note (Signed)
History and Physical Interval Note:  05/22/2020 8:51 AM  Kristopher Fernandez  has presented today for surgery, with the diagnosis of aortic stenosis.  The various methods of treatment have been discussed with the patient and family. After consideration of risks, benefits and other options for treatment, the patient has consented to  Procedure(s): RIGHT/LEFT HEART CATH AND CORONARY ANGIOGRAPHY (N/A) as a surgical intervention.  The patient's history has been reviewed, patient examined, no change in status, stable for surgery.  I have reviewed the patient's chart and labs.  Questions were answered to the patient's satisfaction.     Zunairah Devers [employer]

## 2020-05-23 NOTE — Progress Notes (Signed)
[place]  [phone] Fax:(336) [phone]  ID: Beverely Low OB: 1932/06/21  MR#: [id number]  NTZ#:[id number]  Patient Care Team: Tracie Harrier, MD as PCP - General (Internal Medicine) Larey Dresser, MD as Consulting Physician (Cardiology)  CHIEF COMPLAINT: Pancreatic mass, prostate cancer.  INTERVAL HISTORY: Patient returns to clinic today for repeat laboratory work and further evaluation.  He complains of mildly increased fatigue, but otherwise feels well.  He continues to remain active.  He has no neurologic complaints.  He denies any recent fevers or illnesses.  He has a good appetite and denies weight loss.  He does not complain of pain.  He has no chest pain, shortness of breath, cough, or hemoptysis.  He denies any nausea, vomiting, constipation, or diarrhea.  He has no urinary complaints.  Patient offers no further specific complaints today.  REVIEW OF SYSTEMS:   Review of Systems  Constitutional: Positive for malaise/fatigue. Negative for fever and weight loss.  Respiratory: Negative.  Negative for cough, hemoptysis and shortness of breath.   Cardiovascular: Negative.  Negative for chest pain and leg swelling.  Gastrointestinal: Negative.  Negative for abdominal pain.  Genitourinary: Negative.  Negative for dysuria.  Musculoskeletal: Negative.  Negative for back pain.  Skin: Negative.  Negative for rash.  Neurological: Negative.  Negative for dizziness, focal weakness, weakness and headaches.  Psychiatric/Behavioral: Negative.  The patient is not nervous/anxious.     As per HPI. Otherwise, a complete review of systems is negative.  PAST MEDICAL HISTORY: Past Medical History:  Diagnosis Date  . Anginal pain ([place])   . Aortic stenosis 07/03/2014   Overview:  Mild with calculated aortic valve area of 1.05cm2  . Basal cell carcinoma, ear 03/31/2015  . Bradycardia 07/03/2014  . CHF (congestive heart failure) ([place])   . DDD (degenerative disc  disease), lumbar 08/06/2015  . H/O cardiac catheterization 07/03/2014   Overview:  Cypher stent proximal and distal RCA 07/18/05 and TAXUS stent mid LAD 07/20/05 at [facility]  . HTN (hypertension) 07/03/2014  . Hyperlipidemia 07/03/2014  . Lumbosacral radiculopathy at S1 08/22/2015  . MI (myocardial infarction) ([place]) 07/03/2014   Overview:  Mi 07/17/05  . Myocardial infarction ([place])   . Normocytic normochromic anemia 09/14/2015  . Pacemaker 07/03/2014   Overview:  Dual chamber pacemaker 08/11/11  . Shortness of breath dyspnea   . SOB (shortness of breath) on exertion 05/13/2016    PAST SURGICAL HISTORY: Past Surgical History:  Procedure Laterality Date  . CARDIAC CATHETERIZATION Bilateral 07/14/2016   Procedure: Right/Left Heart Cath and Coronary Angiography;  Surgeon: Isaias Cowman, MD;  Location: [place] [facility];  Service: Cardiovascular;  Laterality: Bilateral;  . CORONARY ANGIOPLASTY    . HERNIA REPAIR    . LEFT HEART CATH AND CORONARY ANGIOGRAPHY N/A 05/09/2019   Procedure: LEFT HEART CATH AND CORONARY ANGIOGRAPHY;  Surgeon: Corey Skains, MD;  Location: [place] [facility];  Service: Cardiovascular;  Laterality: N/A;  . PROSTATE BIOPSY N/A 12/11/2019   Procedure: PROSTATE BIOPSY;  Surgeon: Abbie Sons, MD;  Location: [facility] ORS;  Service: Urology;  Laterality: N/A;  . RIGHT HEART CATH N/A 05/17/2019   Procedure: RIGHT HEART CATH;  Surgeon: Larey Dresser, MD;  Location: [place] [facility];  Service: Cardiovascular;  Laterality: N/A;  . RIGHT HEART CATH AND CORONARY ANGIOGRAPHY N/A 05/22/2020   Procedure: RIGHT HEART CATH AND CORONARY ANGIOGRAPHY;  Surgeon: Larey Dresser, MD;  Location: [place] [facility];  Service: Cardiovascular;  Laterality:  N/A;  . TEE WITHOUT CARDIOVERSION N/A 05/14/2020   Procedure: TRANSESOPHAGEAL ECHOCARDIOGRAM (TEE);  Surgeon: Larey Dresser, MD;  Location: [facility] ENDOSCOPY;  Service: Cardiovascular;  Laterality: N/A;  . TRANSRECTAL ULTRASOUND N/A  12/11/2019   Procedure: TRANSRECTAL ULTRASOUND;  Surgeon: Abbie Sons, MD;  Location: [facility] ORS;  Service: Urology;  Laterality: N/A;    FAMILY HISTORY: Family History  Problem Relation Age of Onset  . Bladder Cancer Neg Hx   . Kidney cancer Neg Hx   . Prostate cancer Neg Hx     ADVANCED DIRECTIVES (Y/N):  N  HEALTH MAINTENANCE: Social History   Tobacco Use  . Smoking status: Former Smoker    Years: 10.00    Types: Cigarettes    Quit date: 1972    Years since quitting: 49.4  . Smokeless tobacco: Former Systems developer    Types: Chew  Substance Use Topics  . Alcohol use: No  . Drug use: No     Colonoscopy:  PAP:  Bone density:  Lipid panel:  No Known Allergies  Current Outpatient Medications  Medication Sig Dispense Refill  . acetaminophen (TYLENOL) 500 MG tablet Take 1,000 mg by mouth every 6 (six) hours as needed for mild pain or moderate pain.     Marland Kitchen albuterol (VENTOLIN HFA) 108 (90 Base) MCG/ACT inhaler Inhale 1-2 puffs into the lungs every 6 (six) hours as needed for wheezing or shortness of breath.    Marland Kitchen apixaban (ELIQUIS) 5 MG TABS tablet Take 1 tablet (5 mg total) by mouth 2 (two) times daily. 60 tablet 6  . atorvastatin (LIPITOR) 80 MG tablet Take 80 mg by mouth daily at 6 PM.     . carvedilol (COREG) 6.25 MG tablet Take 1 tablet (6.25 mg total) by mouth 2 (two) times daily with a meal. 60 tablet 5  . furosemide (LASIX) 20 MG tablet Take 1 tablet (20 mg total) by mouth as needed (weight. Take one tablet if weight increases by 3 pounds overnight or 5 pounds in one week.). (Patient taking differently: Take 20 mg by mouth daily as needed for fluid (weight. Take one tablet if weight increases by 3 pounds overnight or 5 pounds in one week.). ) 30 tablet 0  . ipratropium-albuterol (DUONEB) 0.5-2.5 (3) MG/3ML SOLN Take 3 mLs by nebulization every 6 (six) hours as needed. (Patient taking differently: Take 3 mLs by nebulization every 6 (six) hours as needed (wheezing/shortness of  breath.). ) 360 mL 1  . ranolazine (RANEXA) 500 MG 12 hr tablet Take 500 mg by mouth 2 (two) times daily.    Marland Kitchen spironolactone (ALDACTONE) 25 MG tablet Take 0.5 tablets (12.5 mg total) by mouth at bedtime. (Patient taking differently: Take 12.5 mg by mouth daily after breakfast. ) 45 tablet 3  . tamsulosin (FLOMAX) 0.4 MG CAPS capsule Take 1 capsule (0.4 mg total) by mouth daily after supper. 30 capsule 6  . venlafaxine XR (EFFEXOR-XR) 75 MG 24 hr capsule Take 1 capsule (75 mg total) by mouth daily with breakfast. 30 capsule 0   No current facility-administered medications for this visit.    OBJECTIVE: Vitals:   05/27/20 1025  BP: 104/85  Pulse: 79  Temp: 97.6 F (36.4 C)  SpO2: 99%     Body mass index is 25.21 kg/m.    ECOG FS:0 - Asymptomatic  General: Well-developed, well-nourished, no acute distress. Eyes: Pink conjunctiva, anicteric sclera. HEENT: Normocephalic, moist mucous membranes. Lungs: No audible wheezing or coughing. Heart: Regular rate and rhythm. Abdomen: Soft,  nontender, no obvious distention. Musculoskeletal: No edema, cyanosis, or clubbing. Neuro: Alert, answering all questions appropriately. Cranial nerves grossly intact. Skin: No rashes or petechiae noted. Psych: Normal affect.   LAB RESULTS:  Lab Results  Component Value Date   NA 136 05/27/2020   K 4.2 05/27/2020   CL 102 05/27/2020   CO2 27 05/27/2020   GLUCOSE 108 (H) 05/27/2020   BUN 25 (H) 05/27/2020   CREATININE 1.13 05/27/2020   CALCIUM 8.8 (L) 05/27/2020   PROT 7.5 02/04/2020   ALBUMIN 3.8 02/04/2020   AST 22 02/04/2020   ALT 19 02/04/2020   ALKPHOS 108 02/04/2020   BILITOT 0.7 02/04/2020   GFRNONAA 59 (L) 05/27/2020   GFRAA >60 05/27/2020    Lab Results  Component Value Date   WBC 4.2 05/27/2020   NEUTROABS 3.0 05/27/2020   HGB 10.7 (L) 05/27/2020   HCT 31.3 (L) 05/27/2020   MCV 91.8 05/27/2020   PLT 105 (L) 05/27/2020     STUDIES: CARDIAC CATHETERIZATION  Result Date:  05/22/2020 1. Preserved cardiac output. 2. Low filling pressures. 3. Mild pulmonary hypertension. 4. 80% stenosis mid PLV branch off RCA (known from prior cath). 5. Known occlusion of LCx after OM1. 6. Moderate disease in LAD, 50% proximal and 50-60% distal.  7. Known severe AS from echo and TEE, valve not crossed. Referred for TAVR evaluation.   ECHO TEE  Result Date: 05/14/2020    TRANSESOPHOGEAL ECHO REPORT   Patient Name:   Kristopher Fernandez Date of Exam: 05/14/2020 Medical Rec #:  [id number]    Height:       72.0 in Accession #:    [id number]   Weight:       186.6 lb Date of Birth:  Mar 25, 1932    BSA:          2.069 m Patient Age:    86 years     BP:           120/80 mmHg Patient Gender: M            HR:           70 bpm. Exam Location:  Inpatient Procedure: Transesophageal Echo Indications:    Aortic stenosis  History:        Patient has prior history of Echocardiogram examinations.  Sonographer:    Vikki Ports T[place]ine Referring Phys: [place]: The transesophogeal probe was passed without difficulty through the esophogus of the patient. Sedation performed by different physician. The patient developed no complications during the procedure. IMPRESSIONS  1. Left ventricular ejection fraction, by estimation, is 20 to 25%. The left ventricle has severely decreased function. The left ventricle demonstrates global hypokinesis. The left ventricular internal cavity size was moderately dilated.  2. Right ventricular systolic function is mildly reduced. The right ventricular size is mildly enlarged.  3. Left atrial size was moderately dilated. No left atrial/left atrial appendage thrombus was detected.  4. Right atrial size was mildly dilated. No ASD or PFO by color doppler.  5. The mitral valve is normal in structure. Trivial mitral valve regurgitation. No evidence of mitral stenosis.  6. The aortic valve is tricuspid. Aortic valve regurgitation is trivial. Severe aortic valve stenosis. Aortic valve  area, by VTI measures 0.56 cm. Aortic valve mean gradient measures 27.0 mmHg. Dimensionless index 0.24.  7. Grade 3 plaque in the descending thoracic aorta and arch. FINDINGS  Left Ventricle: Left ventricular ejection fraction, by estimation, is 20 to 25%. The left ventricle  has severely decreased function. The left ventricle demonstrates global hypokinesis. The left ventricular internal cavity size was moderately dilated. There is no left ventricular hypertrophy. Right Ventricle: The right ventricular size is mildly enlarged. No increase in right ventricular wall thickness. Right ventricular systolic function is mildly reduced. Left Atrium: Left atrial size was moderately dilated. No left atrial/left atrial appendage thrombus was detected. Right Atrium: Right atrial size was mildly dilated. Pericardium: There is no evidence of pericardial effusion. Mitral Valve: The mitral valve is normal in structure. Mild mitral annular calcification. Trivial mitral valve regurgitation. No evidence of mitral valve stenosis. Tricuspid Valve: The tricuspid valve is normal in structure. Tricuspid valve regurgitation is mild. Aortic Valve: The aortic valve is tricuspid. Aortic valve regurgitation is trivial. Severe aortic stenosis is present. Aortic valve mean gradient measures 27.0 mmHg. Aortic valve peak gradient measures 40.7 mmHg. Aortic valve area, by VTI measures 0.56 cm. Pulmonic Valve: The pulmonic valve was normal in structure. Pulmonic valve regurgitation is trivial. Aorta: Grade 3 plaque in the descending thoracic aorta and arch. The aortic root is normal in size and structure. IAS/Shunts: No atrial level shunt detected by color flow Doppler. Additional Comments: A pacer wire is visualized in the right ventricle.  LEFT VENTRICLE PLAX 2D LVIDd:         7.20 cm LVOT diam:     1.90 cm LV SV:         43 LV SV Index:   21 LVOT Area:     2.84 cm  AORTIC VALVE AV Area (Vmax):    0.69 cm AV Area (Vmean):   0.58 cm AV Area  (VTI):     0.56 cm AV Vmax:           319.00 cm/s AV Vmean:          252.000 cm/s AV VTI:            0.771 m AV Peak Grad:      40.7 mmHg AV Mean Grad:      27.0 mmHg LVOT Vmax:         77.20 cm/s LVOT Vmean:        51.200 cm/s LVOT VTI:          0.151 m LVOT/AV VTI ratio: 0.20 TRICUSPID VALVE TR Peak grad:   27.2 mmHg TR Vmax:        261.00 cm/s  SHUNTS Systemic VTI:  0.15 m Systemic Diam: 1.90 cm Loralie Champagne MD Electronically signed by Loralie Champagne MD Signature Date/Time: 05/14/2020/10:48:39 AM    Final     ASSESSMENT: Pancreatic mass, prostate cancer.  PLAN:    1.  Prostate cancer: Pathology revealed multiple cores of Gleason 3+4 and 4+4 adenocarcinoma. PET scan on February 21, 2020 reviewed independently and reported as above with 2 enlarged hypermetabolic left iliac lymph nodes likely nodal metastasis.  Patient has now completed XRT for local control disease.  His PSA has decreased from 192-3.11. Given patient's advanced age, would not recommend chemotherapy but he may tolerate Zytiga or Xtandi if necessary.  No further interventions are needed.  Return to clinic in 3 months for further evaluation and continuation of Eligard.  2.  Pancreatic mass: PET scan results as above with no hypermetabolism in pancreatic lesion.  Despite this, patient CA 19-9 has increased from 96-290.  Will consider repeat imaging in 3 months.  3.  Anemia: Mild, monitor.  Hemoglobin is 10.7. 4.  Thrombocytopenia: Mild, monitor.  Platelet count is 105.   Patient  expressed understanding and was in agreement with this plan. He also understands that He can call clinic at any time with any questions, concerns, or complaints.   Cancer Staging No matching staging information was found for the patient.  Lloyd Huger, MD   05/28/2020 6:01 AM

## 2020-05-26 ENCOUNTER — Other Ambulatory Visit: Payer: Self-pay | Admitting: *Deleted

## 2020-05-26 ENCOUNTER — Ambulatory Visit: Payer: Medicare Other | Admitting: Cardiovascular Disease

## 2020-05-26 DIAGNOSIS — C61 Malignant neoplasm of prostate: Secondary | ICD-10-CM

## 2020-05-27 ENCOUNTER — Ambulatory Visit
Admission: RE | Admit: 2020-05-27 | Discharge: 2020-05-27 | Disposition: A | Discharge status: Home / Self Care | Payer: Medicare Other | Source: Ambulatory Visit | Attending: Radiation Oncology | Admitting: Radiation Oncology

## 2020-05-27 ENCOUNTER — Encounter: Payer: Self-pay | Admitting: Oncology

## 2020-05-27 ENCOUNTER — Inpatient Hospital Stay: Payer: Medicare Other | Attending: Oncology

## 2020-05-27 ENCOUNTER — Other Ambulatory Visit: Payer: Self-pay

## 2020-05-27 ENCOUNTER — Inpatient Hospital Stay (HOSPITAL_BASED_OUTPATIENT_CLINIC_OR_DEPARTMENT_OTHER): Payer: Medicare Other | Admitting: Oncology

## 2020-05-27 VITALS — BP 104/85 | HR 79 | Temp 97.6°F | Wt 185.9 lb

## 2020-05-27 DIAGNOSIS — Z87891 Personal history of nicotine dependence: Secondary | ICD-10-CM | POA: Insufficient documentation

## 2020-05-27 DIAGNOSIS — Z79899 Other long term (current) drug therapy: Secondary | ICD-10-CM | POA: Insufficient documentation

## 2020-05-27 DIAGNOSIS — D649 Anemia, unspecified: Secondary | ICD-10-CM | POA: Insufficient documentation

## 2020-05-27 DIAGNOSIS — Z7901 Long term (current) use of anticoagulants: Secondary | ICD-10-CM | POA: Diagnosis not present

## 2020-05-27 DIAGNOSIS — Z923 Personal history of irradiation: Secondary | ICD-10-CM | POA: Insufficient documentation

## 2020-05-27 DIAGNOSIS — M5136 Other intervertebral disc degeneration, lumbar region: Secondary | ICD-10-CM | POA: Insufficient documentation

## 2020-05-27 DIAGNOSIS — Z85828 Personal history of other malignant neoplasm of skin: Secondary | ICD-10-CM | POA: Insufficient documentation

## 2020-05-27 DIAGNOSIS — E785 Hyperlipidemia, unspecified: Secondary | ICD-10-CM | POA: Diagnosis not present

## 2020-05-27 DIAGNOSIS — R1902 Left upper quadrant abdominal swelling, mass and lump: Secondary | ICD-10-CM | POA: Insufficient documentation

## 2020-05-27 DIAGNOSIS — R978 Other abnormal tumor markers: Secondary | ICD-10-CM | POA: Insufficient documentation

## 2020-05-27 DIAGNOSIS — I252 Old myocardial infarction: Secondary | ICD-10-CM | POA: Diagnosis not present

## 2020-05-27 DIAGNOSIS — I350 Nonrheumatic aortic (valve) stenosis: Secondary | ICD-10-CM | POA: Diagnosis not present

## 2020-05-27 DIAGNOSIS — C61 Malignant neoplasm of prostate: Secondary | ICD-10-CM | POA: Insufficient documentation

## 2020-05-27 DIAGNOSIS — I509 Heart failure, unspecified: Secondary | ICD-10-CM | POA: Insufficient documentation

## 2020-05-27 DIAGNOSIS — D696 Thrombocytopenia, unspecified: Secondary | ICD-10-CM | POA: Insufficient documentation

## 2020-05-27 DIAGNOSIS — I10 Essential (primary) hypertension: Secondary | ICD-10-CM | POA: Insufficient documentation

## 2020-05-27 LAB — BASIC METABOLIC PANEL
Anion gap: 7 (ref 5–15)
BUN: 25 mg/dL — ABNORMAL HIGH (ref 8–23)
CO2: 27 mmol/L (ref 22–32)
Calcium: 8.8 mg/dL — ABNORMAL LOW (ref 8.9–10.3)
Chloride: 102 mmol/L (ref 98–111)
Creatinine, Ser: 1.13 mg/dL (ref 0.61–1.24)
GFR calc Af Amer: >60 mL/min (ref >60)
GFR calc non Af Amer: 59 mL/min — ABNORMAL LOW (ref >60)
Glucose, Bld: 108 mg/dL — ABNORMAL HIGH (ref 70–99)
Potassium: 4.2 mmol/L (ref 3.5–5.1)
Sodium: 136 mmol/L (ref 135–145)

## 2020-05-27 LAB — CBC WITH DIFFERENTIAL/PLATELET
Abs Immature Granulocytes: 0.03 10*3/uL (ref 0.00–0.07)
Basophils Absolute: 0 10*3/uL (ref 0.0–0.1)
Basophils Relative: 0 %
Eosinophils Absolute: 0.1 10*3/uL (ref 0.0–0.5)
Eosinophils Relative: 3 %
HCT: 31.3 % — ABNORMAL LOW (ref 39.0–52.0)
Hemoglobin: 10.7 g/dL — ABNORMAL LOW (ref 13.0–17.0)
Immature Granulocytes: 1 %
Lymphocytes Relative: 12 %
Lymphs Abs: 0.5 10*3/uL — ABNORMAL LOW (ref 0.7–4.0)
MCH: 31.4 pg (ref 26.0–34.0)
MCHC: 34.2 g/dL (ref 30.0–36.0)
MCV: 91.8 fL (ref 80.0–100.0)
Monocytes Absolute: 0.5 10*3/uL (ref 0.1–1.0)
Monocytes Relative: 12 %
Neutro Abs: 3 10*3/uL (ref 1.7–7.7)
Neutrophils Relative %: 72 %
Platelets: 105 10*3/uL — ABNORMAL LOW (ref 150–400)
RBC: 3.41 MIL/uL — ABNORMAL LOW (ref 4.22–5.81)
RDW: 14.2 % (ref 11.5–15.5)
WBC: 4.2 10*3/uL (ref 4.0–10.5)
nRBC: 0 % (ref 0.0–0.2)

## 2020-05-27 LAB — PSA: Prostatic Specific Antigen: 3.11 ng/mL (ref 0.00–4.00)

## 2020-05-27 NOTE — Progress Notes (Signed)
Radiation Oncology Follow up Note  Name: Kristopher Fernandez   Date:   05/27/2020 MRN:  [id number] DOB: Jan 29, 1932    This 86 y.o. 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  Seen today in routine follow-up 1 month out from external beam radiation therapy to his prostate and pelvic nodes.  He is doing fairly well except for fatigue.  He specifically [place] significant increased lower urinary tract symptoms or diarrhea.  He is having no pain at this time.  He is also seeing Dr. Grayland Ormond today and is on Eligard.  COMPLICATIONS OF TREATMENT: none  FOLLOW UP COMPLIANCE: keeps appointments   PHYSICAL EXAM:  There were no vitals taken for this visit. Well-developed well-nourished patient in NAD. HEENT reveals PERLA, EOMI, discs not visualized.  Oral cavity is clear. No oral mucosal lesions are identified. Neck is clear without evidence of cervical or supraclavicular adenopathy. Lungs are clear to A&P. Cardiac examination is essentially unremarkable with regular rate and rhythm without murmur rub or thrill. Abdomen is benign with no organomegaly or masses noted. Motor sensory and DTR levels are equal and symmetric in the upper and lower extremities. Cranial nerves II through XII are grossly intact. Proprioception is intact. No peripheral adenopathy or edema is identified. No motor or sensory levels are noted. Crude visual fields are within normal range.  RADIOLOGY RESULTS: No current films to review  PLAN: Present time I suggested to patient to exercise somewhat I believe his advanced age is causing some of his fatigue.  I have asked to see him back after his next PSA reading which  is in September.  We will see Dr. Grayland Ormond the same day.  Otherwise the side effect profile is good he continues on androgen deprivation therapy.  Patient knows to call with any concerns.  I would like to take this opportunity to thank you for allowing me to participate in the care of your patient.Noreene Filbert, MD

## 2020-05-27 NOTE — Progress Notes (Signed)
[facility]ural Heart Clinic Consult Note  Chief Complaint  Patient presents with  . Follow-up    Severe aortic stenosis    History of Present Illness: 86 yo male with history of CAD s/p multiple PCIs, ischemic cardiomyopathy, sick sinus syndrome s/p pacemaker, CKD stage 3, prostate cancer, pancreatic mass, HTN, hyperlipidemia and severe aortic stenosis who is referred to the valve clinic today by Dr. Aundra Dubin for further discussion regarding his severe aortic stenosis and possible TAVR. He has been followed in the advanced heart failure clinic and has been known to have moderate aortic stenosis. Most recent echo May 2921 with LVEF=20-25%. The aortic valve leaflets are thickened with limited leaflet mobility. The mean gradient is 27 mmHg, peak gradient 40.95mmHg, AVA 0.56 cm2, dimensionless index 0.24. HE is known to have CAD with chronic occlusion of the mid Circumflex). Most recent cardiac cath 05/22/20 with stable CAD. Moderate disease in the LAD, CTO of the Circumflex and severe right posterolateral artery stenosis. He has been treated for prostate cancer with radiation therapy.  His oncologist is following a pancreatic mass. He has a history of symptomatic bradycardia and had a pacemaker implanted in 2012. He had a suspected CVA in June 2020. He has been on Eliquis since then. He has recently had trouble tolerating his cardiac medications due to hypotension and orthostasis.   He tells me today that he has been having progressive dyspnea and fatigue. He has no chest pain, dizziness or lower extremity edema. He has a partial denture. His other teeth have no issues. He lives in [place] alone. His wife passed away 14 months ago from a stroke. 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   Primary Care Physician: Tracie Harrier, MD Primary Cardiologist: Aundra Dubin Referring Cardiologist: Aundra Dubin  Past Medical History:  Diagnosis Date  . Anginal pain ([place])   . Aortic stenosis 07/03/2014   Overview:   Mild with calculated aortic valve area of 1.05cm2  . Basal cell carcinoma, ear 03/31/2015  . Bradycardia 07/03/2014  . CHF (congestive heart failure) ([place])   . DDD (degenerative disc disease), lumbar 08/06/2015  . H/O cardiac catheterization 07/03/2014   Overview:  Cypher stent proximal and distal RCA 07/18/05 and TAXUS stent mid LAD 07/20/05 at [facility]  . HTN (hypertension) 07/03/2014  . Hyperlipidemia 07/03/2014  . Lumbosacral radiculopathy at S1 08/22/2015  . MI (myocardial infarction) ([place]) 07/03/2014   Overview:  Mi 07/17/05  . Myocardial infarction ([place])   . Normocytic normochromic anemia 09/14/2015  . Pacemaker 07/03/2014   Overview:  Dual chamber pacemaker 08/11/11  . Shortness of breath dyspnea   . SOB (shortness of breath) on exertion 05/13/2016    Past Surgical History:  Procedure Laterality Date  . CARDIAC CATHETERIZATION Bilateral 07/14/2016   Procedure: Right/Left Heart Cath and Coronary Angiography;  Surgeon: Isaias Cowman, MD;  Location: [place] [facility];  Service: Cardiovascular;  Laterality: Bilateral;  . CORONARY ANGIOPLASTY    . HERNIA REPAIR    . LEFT HEART CATH AND CORONARY ANGIOGRAPHY N/A 05/09/2019   Procedure: LEFT HEART CATH AND CORONARY ANGIOGRAPHY;  Surgeon: Corey Skains, MD;  Location: [place] [facility];  Service: Cardiovascular;  Laterality: N/A;  . PROSTATE BIOPSY N/A 12/11/2019   Procedure: PROSTATE BIOPSY;  Surgeon: Abbie Sons, MD;  Location: [facility] ORS;  Service: Urology;  Laterality: N/A;  . RIGHT HEART CATH N/A 05/17/2019   Procedure: RIGHT HEART CATH;  Surgeon: Larey Dresser, MD;  Location: [place] [facility];  Service:  Cardiovascular;  Laterality: N/A;  . RIGHT HEART CATH AND CORONARY ANGIOGRAPHY N/A 05/22/2020   Procedure: RIGHT HEART CATH AND CORONARY ANGIOGRAPHY;  Surgeon: Larey Dresser, MD;  Location: [place] [facility];  Service: Cardiovascular;  Laterality: N/A;  . TEE WITHOUT CARDIOVERSION N/A 05/14/2020   Procedure:  TRANSESOPHAGEAL ECHOCARDIOGRAM (TEE);  Surgeon: Larey Dresser, MD;  Location: [facility] ENDOSCOPY;  Service: Cardiovascular;  Laterality: N/A;  . TRANSRECTAL ULTRASOUND N/A 12/11/2019   Procedure: TRANSRECTAL ULTRASOUND;  Surgeon: Abbie Sons, MD;  Location: [facility] ORS;  Service: Urology;  Laterality: N/A;    Current Outpatient Medications  Medication Sig Dispense Refill  . acetaminophen (TYLENOL) 500 MG tablet Take 1,000 mg by mouth every 6 (six) hours as needed for mild pain or moderate pain.     Marland Kitchen albuterol (VENTOLIN HFA) 108 (90 Base) MCG/ACT inhaler Inhale 1-2 puffs into the lungs every 6 (six) hours as needed for wheezing or shortness of breath.    Marland Kitchen apixaban (ELIQUIS) 5 MG TABS tablet Take 1 tablet (5 mg total) by mouth 2 (two) times daily. 60 tablet 6  . atorvastatin (LIPITOR) 80 MG tablet Take 80 mg by mouth daily at 6 PM.     . carvedilol (COREG) 6.25 MG tablet Take 1 tablet (6.25 mg total) by mouth 2 (two) times daily with a meal. 60 tablet 5  . furosemide (LASIX) 20 MG tablet Take 1 tablet (20 mg total) by mouth as needed (weight. Take one tablet if weight increases by 3 pounds overnight or 5 pounds in one week.). (Patient taking differently: Take 20 mg by mouth daily as needed for fluid (weight. Take one tablet if weight increases by 3 pounds overnight or 5 pounds in one week.). ) 30 tablet 0  . ipratropium-albuterol (DUONEB) 0.5-2.5 (3) MG/3ML SOLN Take 3 mLs by nebulization every 6 (six) hours as needed. (Patient taking differently: Take 3 mLs by nebulization every 6 (six) hours as needed (wheezing/shortness of breath.). ) 360 mL 1  . ranolazine (RANEXA) 500 MG 12 hr tablet Take 500 mg by mouth 2 (two) times daily.    Marland Kitchen spironolactone (ALDACTONE) 25 MG tablet Take 0.5 tablets (12.5 mg total) by mouth at bedtime. (Patient taking differently: Take 12.5 mg by mouth daily after breakfast. ) 45 tablet 3  . tamsulosin (FLOMAX) 0.4 MG CAPS capsule Take 1 capsule (0.4 mg total) by mouth daily  after supper. 30 capsule 6  . venlafaxine XR (EFFEXOR-XR) 75 MG 24 hr capsule Take 1 capsule (75 mg total) by mouth daily with breakfast. 30 capsule 0   No current facility-administered medications for this visit.    No Known Allergies  Social History   Socioeconomic History  . Marital status: Widowed    Spouse name: Not on file  . Number of children: 1  . Years of education: Not on file  . Highest education level: Not on file  Occupational History  . Occupation: retired-farmer  Tobacco Use  . Smoking status: Former Smoker    Years: 10.00    Types: Cigarettes    Quit date: 1972    Years since quitting: 49.4  . Smokeless tobacco: Former Systems developer    Types: Chew  Substance and Sexual Activity  . Alcohol use: No  . Drug use: No  . Sexual activity: Not on file  Other Topics Concern  . Not on file  Social History Narrative  . Not on file   Social Determinants of Health   Financial Resource Strain:   .  Difficulty of Paying Living Expenses:   Food Insecurity:   . Worried About Charity fundraiser in the Last Year:   . Arboriculturist in the Last Year:   Transportation Needs:   . Film/video editor (Medical):   Marland Kitchen Lack of Transportation (Non-Medical):   Physical Activity:   . Days of Exercise per Week:   . Minutes of Exercise per Session:   Stress:   . Feeling of Stress :   Social Connections:   . Frequency of Communication with Friends and Family:   . Frequency of Social Gatherings with Friends and Family:   . Attends Religious Services:   . Active Member of Clubs or Organizations:   . Attends Archivist Meetings:   Marland Kitchen Marital Status:   Intimate Partner Violence:   . Fear of Current or Ex-Partner:   . Emotionally Abused:   Marland Kitchen Physically Abused:   . Sexually Abused:     Family History  Problem Relation Age of Onset  . Heart attack Father   . Bladder Cancer Neg Hx   . Kidney cancer Neg Hx   . Prostate cancer Neg Hx     Review of Systems:  As stated  in the HPI and otherwise negative.   BP 100/60   Pulse (!) 57   Ht 6' (1.829 m)   Wt 185 lb (83.9 kg)   SpO2 98%   BMI 25.09 kg/m   Physical Examination: General: Well developed, well nourished, NAD  HEENT: OP clear, mucus membranes moist  SKIN: warm, dry. No rashes. Neuro: No focal deficits  Musculoskeletal: Muscle strength 5/5 all ext  Psychiatric: Mood and affect normal  Neck: No JVD, no carotid bruits, no thyromegaly, no lymphadenopathy.  Lungs:Clear bilaterally, no wheezes, rhonci, crackles Cardiovascular: Regular rate and rhythm.Soft systolic murmur.   Abdomen:Soft. Bowel sounds present. Non-tender.  Extremities: No lower extremity edema. Pulses are 2 + in the bilateral DP/PT.  EKG:  EKG is not ordered today. The ekg ordered today demonstrates   Echo 05/14/20:  1. Left ventricular ejection fraction, by estimation, is 20 to 25%. The  left ventricle has severely decreased function. The left ventricle  demonstrates global hypokinesis. The left ventricular internal cavity size  was moderately dilated.  2. Right ventricular systolic function is mildly reduced. The right  ventricular size is mildly enlarged.  3. Left atrial size was moderately dilated. No left atrial/left atrial  appendage thrombus was detected.  4. Right atrial size was mildly dilated. No ASD or PFO by color doppler.  5. The mitral valve is normal in structure. Trivial mitral valve  regurgitation. No evidence of mitral stenosis.  6. The aortic valve is tricuspid. Aortic valve regurgitation is trivial.  Severe aortic valve stenosis. Aortic valve area, by VTI measures 0.56 cm.  Aortic valve mean gradient measures 27.0 mmHg. Dimensionless index 0.24.  7. Grade 3 plaque in the descending thoracic aorta and arch.   Left Ventricle: Left ventricular ejection fraction, by estimation, is 20  to 25%. The left ventricle has severely decreased function. The left  ventricle demonstrates global hypokinesis.  The left ventricular internal  cavity size was moderately dilated.  There is no left ventricular hypertrophy.   Right Ventricle: The right ventricular size is mildly enlarged. No  increase in right ventricular wall thickness. Right ventricular systolic  function is mildly reduced.   Left Atrium: Left atrial size was moderately dilated. No left atrial/left  atrial appendage thrombus was detected.   Right  Atrium: Right atrial size was mildly dilated.   Pericardium: There is no evidence of pericardial effusion.   Mitral Valve: The mitral valve is normal in structure. Mild mitral annular  calcification. Trivial mitral valve regurgitation. No evidence of mitral  valve stenosis.   Tricuspid Valve: The tricuspid valve is normal in structure. Tricuspid  valve regurgitation is mild.   Aortic Valve: The aortic valve is tricuspid. Aortic valve regurgitation is  trivial. Severe aortic stenosis is present. Aortic valve mean gradient  measures 27.0 mmHg. Aortic valve peak gradient measures 40.7 mmHg. Aortic  valve area, by VTI measures 0.56  cm.   Pulmonic Valve: The pulmonic valve was normal in structure. Pulmonic valve  regurgitation is trivial.   Aorta: Grade 3 plaque in the descending thoracic aorta and arch. The  aortic root is normal in size and structure.   IAS/Shunts: No atrial level shunt detected by color flow Doppler.   Additional Comments: A pacer wire is visualized in the right ventricle.     LEFT VENTRICLE  PLAX 2D  LVIDd:     7.20 cm  LVOT diam:   1.90 cm  LV SV:     43  LV SV Index:  21  LVOT Area:   2.84 cm     AORTIC VALVE  AV Area (Vmax):  0.69 cm  AV Area (Vmean):  0.58 cm  AV Area (VTI):   0.56 cm  AV Vmax:      319.00 cm/s  AV Vmean:     252.000 cm/s  AV VTI:      0.771 m  AV Peak Grad:   40.7 mmHg  AV Mean Grad:   27.0 mmHg  LVOT Vmax:     77.20 cm/s  LVOT Vmean:    51.200 cm/s  LVOT VTI:      0.151 m  LVOT/AV VTI ratio: 0.20   TRICUSPID VALVE  TR Peak grad:  27.2 mmHg  TR Vmax:    261.00 cm/s    SHUNTS  Systemic VTI: 0.15 m  Systemic Diam: 1.90 cm   Cardiac cath 05/22/20: 1. Preserved cardiac output.  2. Low filling pressures.  3. Mild pulmonary hypertension.  4. 80% stenosis mid PLV branch off RCA (known from prior cath).  5. Known occlusion of LCx after OM1.  6. Moderate disease in LAD, 50% proximal and 50-60% distal.   7. Known severe AS from echo and TEE, valve not crossed.   Pressures RHC Procedural Findings: Hemodynamics (mmHg) RA mean 4 RV 47/4 PA 43/9, mean 21 PCWP mean 9 AO 126/52  Oxygen saturations: PA 62% AO 99%  Cardiac Output (Fick) 4.8  Cardiac Index (Fick) 2.34 PVR 2.5 WU     Recent Labs: 02/04/2020: ALT 19 05/27/2020: BUN 25; Creatinine, Ser 1.13; Hemoglobin 10.7; Platelets 105; Potassium 4.2; Sodium 136   Lipid Panel    Component Value Date/Time   CHOL 123 01/08/2020 1424   TRIG 86 01/08/2020 1424   HDL 57 01/08/2020 1424   CHOLHDL 2.2 01/08/2020 1424   VLDL 17 01/08/2020 1424   LDLCALC 49 01/08/2020 1424     Wt Readings from Last 3 Encounters:  05/28/20 185 lb (83.9 kg)  05/27/20 185 lb 14.4 oz (84.3 kg)  05/22/20 182 lb (82.6 kg)     Other studies Reviewed: Additional studies/ records that were reviewed today include: echo images, cath images, office notes. Review of the above records demonstrates: severe AS   Assessment and Plan:   1. Severe Aortic Valve Stenosis:  He has severe, stage D aortic valve stenosis. I have personally reviewed the echo images. The aortic valve is thickened, calcified with limited leaflet mobility. I think he would benefit from AVR. Given advanced age, he is not a good candidate for conventional AVR by surgical approach. I think he may be a good candidate for TAVR.   STS Risk Score: Risk of Mortality: 2.887% Renal Failure: 2.594% Permanent Stroke: 2.577% Prolonged  Ventilation: 15.873% DSW Infection: 0.106% Reoperation: 5.875% Morbidity or Mortality: 21.431% Short Length of Stay: 15.890% Long Length of Stay: 12.676%   I have reviewed the natural history of aortic stenosis with the patient and their family members  who are present today. We have discussed the limitations of medical therapy and the poor prognosis associated with symptomatic aortic stenosis. We have reviewed potential treatment options, including palliative medical therapy, conventional surgical aortic valve replacement, and transcatheter aortic valve replacement. We discussed treatment options in the context of the patient's specific comorbid medical conditions.   He would like to proceed with planning for TAVR. Risks and benefits of the valve procedure are reviewed with the patient. He has had his cath and has moderate non-obstructive CAD that I think we can treat medically. He had essentially normal carotid arteries last year. Will plan a cardiac CT, CTA of the chest/abdomen and pelvis, PT assessment and he will then be referred to see one of the CT surgeons on our TAVR team.      Current medicines are reviewed at length with the patient today.  The patient does not have concerns regarding medicines.  The following changes have been made:  no change  Labs/ tests ordered today include:  No orders of the defined types were placed in this encounter.    Disposition:   FU with the valve team.    Signed, Lauree Chandler, MD 05/28/2020 10:04 AM    [facility] [address], [place], [place]  [address] Phone: [phone]; Fax: [phone]

## 2020-05-28 ENCOUNTER — Other Ambulatory Visit: Payer: Self-pay

## 2020-05-28 ENCOUNTER — Encounter: Payer: Self-pay | Admitting: Cardiovascular Disease

## 2020-05-28 ENCOUNTER — Ambulatory Visit (INDEPENDENT_AMBULATORY_CARE_PROVIDER_SITE_OTHER): Payer: Medicare Other | Admitting: Cardiovascular Disease

## 2020-05-28 VITALS — BP 100/60 | HR 57 | Ht 72.0 in | Wt 185.0 lb

## 2020-05-28 DIAGNOSIS — I350 Nonrheumatic aortic (valve) stenosis: Secondary | ICD-10-CM

## 2020-05-28 LAB — CANCER ANTIGEN 19-9: CA 19-9: 290 U/mL — ABNORMAL HIGH (ref 0–35)

## 2020-05-28 NOTE — Patient Instructions (Signed)
Medication Instructions:  No changes *If you need a refill on your cardiac medications before your next appointment, please call your pharmacy*   Lab Work: none  Testing/Procedures: Theodosia Quay, RN Structural Nurse Navigator will be in contact with you regarding further testing.    Other Instructions

## 2020-05-30 ENCOUNTER — Ambulatory Visit (HOSPITAL_COMMUNITY)
Admission: RE | Admit: 2020-05-30 | Discharge: 2020-05-30 | Disposition: A | Discharge status: Home / Self Care | Payer: Medicare Other | Source: Ambulatory Visit | Attending: Cardiovascular Disease | Admitting: Cardiovascular Disease

## 2020-05-30 ENCOUNTER — Ambulatory Visit: Payer: Medicare Other | Admitting: Physical Therapy

## 2020-05-30 DIAGNOSIS — I350 Nonrheumatic aortic (valve) stenosis: Secondary | ICD-10-CM

## 2020-05-30 MED ORDER — IOHEXOL 350 MG/ML SOLN
100.0000 mL | Freq: Once | INTRAVENOUS | Status: AC | PRN
  Administered 2020-05-30: 100 mL via INTRAVENOUS

## 2020-06-05 ENCOUNTER — Other Ambulatory Visit: Payer: Medicare Other

## 2020-06-05 NOTE — Progress Notes (Signed)
Tumor Board Documentation  Kristopher Fernandez was presented by Dr Grayland Ormond at our Tumor Board on 06/05/2020, which included representatives from medical oncology, radiation oncology, surgical oncology, internal medicine, navigation, pathology, radiology, surgical, pharmacy, genetics, research, pulmonology.  Kristopher Fernandez currently presents as a current patient, for discussion with history of the following treatments: active survellience.  Additionally, we reviewed previous medical and familial history, history of present illness, and recent lab results along with all available histopathologic and imaging studies. The tumor board considered available treatment options and made the following recommendations: Active surveillance (Repeat scan in 3 months) If patient requirdes a biopsy, it will need to be done by GI Endoscopy  The following procedures/referrals were also placed: No orders of the defined types were placed in this encounter.   Clinical Trial Status: not discussed   Staging used: Not Applicable  National site-specific guidelines   were discussed with respect to the case.  Tumor board is a meeting of clinicians from various specialty areas who evaluate and discuss patients for whom a multidisciplinary approach is being considered. Final determinations in the plan of care are those of the provider(s). The responsibility for follow up of recommendations given during tumor board is that of the provider.   Today's extended care, comprehensive team conference, Kristopher Fernandez was not present for the discussion and was not examined.   Multidisciplinary Tumor Board is a multidisciplinary case peer review process.  Decisions discussed in the Multidisciplinary Tumor Board reflect the opinions of the specialists present at the conference without having examined the patient.  Ultimately, treatment and diagnostic decisions rest with the primary provider(s) and the patient.

## 2020-06-09 ENCOUNTER — Other Ambulatory Visit: Payer: Self-pay

## 2020-06-09 ENCOUNTER — Encounter: Payer: Self-pay | Admitting: Physical Therapy

## 2020-06-09 ENCOUNTER — Institutional Professional Consult (permissible substitution) (INDEPENDENT_AMBULATORY_CARE_PROVIDER_SITE_OTHER): Payer: Medicare Other | Admitting: Thoracic Surgery (Cardiothoracic Vascular Surgery)

## 2020-06-09 ENCOUNTER — Encounter: Payer: Self-pay | Admitting: Thoracic Surgery (Cardiothoracic Vascular Surgery)

## 2020-06-09 ENCOUNTER — Other Ambulatory Visit: Payer: Self-pay | Admitting: Emergency Medicine

## 2020-06-09 ENCOUNTER — Ambulatory Visit: Payer: Medicare Other | Attending: Cardiovascular Disease | Admitting: Physical Therapy

## 2020-06-09 VITALS — BP 90/60 | HR 80 | Temp 97.7°F | Resp 20 | Ht 72.0 in | Wt 185.0 lb

## 2020-06-09 DIAGNOSIS — K8689 Other specified diseases of pancreas: Secondary | ICD-10-CM

## 2020-06-09 DIAGNOSIS — R2689 Other abnormalities of gait and mobility: Secondary | ICD-10-CM | POA: Insufficient documentation

## 2020-06-09 DIAGNOSIS — I350 Nonrheumatic aortic (valve) stenosis: Secondary | ICD-10-CM

## 2020-06-09 NOTE — H&P (View-Only) (Signed)
[facility]  CARDIOTHORACIC SURGERY CONSULTATION REPORT  Referring Provider is Larey Dresser, MD PCP is Tracie Harrier, MD  Chief Complaint  Patient presents with  . Aortic Stenosis    Surgical eval for TAVR/ review all testing/studies    HPI:  Patient is an 86 year old male with history of coronary artery disease status post acute myocardial infarction 2006 with ischemic cardiomyopathy and chronic systolic congestive heart failure, aortic stenosis, sick sinus syndrome status post permanent pacemaker for symptomatic bradycardia in 2012, and questionable history of cerebellar stroke on long-term anticoagulation using Eliquis who has been referred for surgical consultation to discuss treatment options for management of severe symptomatic aortic stenosis.  Patient's cardiac history dates back to 2006 when he suffered an acute myocardial infarction.  He was treated with multivessel stenting.  He had been followed by Dr. Saralyn Pilar in [place] for some time and underwent permanent pacemaker placement for symptomatic bradycardia in 2012.  In May 2020 the patient was hospitalized with acute shortness of breath, dizziness, atypical chest pain.  Troponin was minimally elevated.  Catheterization revealed stable coronary artery disease with chronic occlusion of the mid left circumflex coronary artery and 85% ostial stenosis of the posterior descending coronary artery.  Echocardiogram revealed what was felt to be moderate aortic stenosis and severe left ventricular systolic dysfunction with ejection fraction estimated only 20 to 25%.  Symptoms of dizziness and questionable cerebellar signs prompted head CT which was negative for stroke.  MRI was not performed because of the pacemaker.  The patient was transferred to [facility] where he was seen by neurology who thought symptoms might be related to cerebellar stroke and recommended  anticoagulation using Eliquis.  The patient's acute heart failure was managed by Dr. Aundra Dubin who has been following him carefully ever since.  Echocardiogram performed October 2020 revealed severe left ventricular systolic dysfunction with ejection fraction estimated 20 to 25%.  The aortic valve appeared trileaflet with restricted leaflet mobility.  Peak velocity across aortic valve measured 2.5 m/s corresponding to mean transvalvular gradient estimated at 14 mmHg.  The DVI was notably quite Fernandez at 0.21 with aortic valve area calculated only 0.78 cm by VTI.  Dobutamine stress echocardiogram was performed January 21, 2020.  Ejection fraction was estimated 25 to 30%.  Resting gradient across the aortic valve measured 13 mm at baseline and increased to 34 mm at peak stress with aortic valve area calculated 1.1 cm.  TEE confirmed the presence of severe Fernandez-flow Fernandez gradient aortic stenosis with mean transvalvular gradient 27 mmHg, DVI 0.24, and stroke-volume index only 21 with aortic valve area calculated 0.56 cm by VTI.  Diagnostic cardiac catheterization revealed stable coronary artery anatomy with known chronic occlusion of left circumflex coronary artery and 80% proximal stenosis of posterior lateral branch off the distal right coronary artery.  There was moderate nonobstructive disease in the left anterior descending coronary artery territory and mild pulmonary hypertension.  Cardiac output was preserved.  The patient was referred to the multidisciplinary heart valve clinic and has been evaluated previously by Dr. Angelena Form.  CT angiography was performed and the patient referred for surgical consultation.  Patient is a widower and lives alone [place] the country in [place].  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  Although theoretically he has been retired, he still remains active and works on his farm.  He complains that over the past year or 2 he has experienced steady decline  in his exercise tolerance with  worsening fatigue and severe exertional shortness of breath.  Symptoms have progressed substantially since he underwent radiation therapy recently for prostate cancer.  He states that he now gets short of breath with moderate and sometimes Fernandez-level activity and he frequently has to take breaks.  He denies any history of resting shortness of breath, orthopnea, or lower extremity edema.  He reports occasional mild tightness with episodes of exertional shortness of breath.  He has not recently had any dizzy spells.  Appetite is marginal.  He has not lost any weight.  His gait has become slightly unstable and he now uses a cane for stability.  He has had several mechanical falls from losing his balance.    Past Medical History:  Diagnosis Date  . Anginal pain ([place])   . Aortic stenosis   . Basal cell carcinoma, ear 03/31/2015  . Bradycardia 07/03/2014  . CHF (congestive heart failure) ([place])   . DDD (degenerative disc disease), lumbar 08/06/2015  . H/O cardiac catheterization 07/03/2014   Overview:  Cypher stent proximal and distal RCA 07/18/05 and TAXUS stent mid LAD 07/20/05 at [facility]  . HTN (hypertension) 07/03/2014  . Hyperlipidemia 07/03/2014  . Lumbosacral radiculopathy at S1 08/22/2015  . MI (myocardial infarction) ([place]) 07/03/2014   Overview:  Mi 07/17/05  . Myocardial infarction ([place])   . Normocytic normochromic anemia 09/14/2015  . Pacemaker 07/03/2014   Overview:  Dual chamber pacemaker 08/11/11  . Pancreatic mass    not hypermetabolic on PET-CT imaging  . Prostate cancer ([place])    metastatic  . Shortness of breath dyspnea   . SOB (shortness of breath) on exertion 05/13/2016    Past Surgical History:  Procedure Laterality Date  . CARDIAC CATHETERIZATION Bilateral 07/14/2016   Procedure: Right/Left Heart Cath and Coronary Angiography;  Surgeon: Isaias Cowman, MD;  Location: [facility];  Service: Cardiovascular;  Laterality: Bilateral;  . CORONARY ANGIOPLASTY    . HERNIA  REPAIR    . LEFT HEART CATH AND CORONARY ANGIOGRAPHY N/A 05/09/2019   Procedure: LEFT HEART CATH AND CORONARY ANGIOGRAPHY;  Surgeon: Corey Skains, MD;  Location: [facility];  Service: Cardiovascular;  Laterality: N/A;  . PROSTATE BIOPSY N/A 12/11/2019   Procedure: PROSTATE BIOPSY;  Surgeon: Abbie Sons, MD;  Location: [facility];  Service: Urology;  Laterality: N/A;  . RIGHT HEART CATH N/A 05/17/2019   Procedure: RIGHT HEART CATH;  Surgeon: Larey Dresser, MD;  Location: [facility];  Service: Cardiovascular;  Laterality: N/A;  . RIGHT HEART CATH AND CORONARY ANGIOGRAPHY N/A 05/22/2020   Procedure: RIGHT HEART CATH AND CORONARY ANGIOGRAPHY;  Surgeon: Larey Dresser, MD;  Location: [facility];  Service: Cardiovascular;  Laterality: N/A;  . TEE WITHOUT CARDIOVERSION N/A 05/14/2020   Procedure: TRANSESOPHAGEAL ECHOCARDIOGRAM (TEE);  Surgeon: Larey Dresser, MD;  Location: [facility];  Service: Cardiovascular;  Laterality: N/A;  . TRANSRECTAL ULTRASOUND N/A 12/11/2019   Procedure: TRANSRECTAL ULTRASOUND;  Surgeon: Abbie Sons, MD;  Location: [facility];  Service: Urology;  Laterality: N/A;    Family History  Problem Relation Age of Onset  . Heart attack Father   . Bladder Cancer Neg Hx   . Kidney cancer Neg Hx   . Prostate cancer Neg Hx     Social History   Socioeconomic History  . Marital status: Widowed    Spouse name: Not on file  . Number of children: 1  . Years of  education: Not on file  . Highest education level: Not on file  Occupational History  . Occupation: retired-farmer  Tobacco Use  . Smoking status: Former Smoker    Years: 10.00    Types: Cigarettes    Quit date: 1972    Years since quitting: 49.5  . Smokeless tobacco: Former Systems developer    Types: Secondary school teacher  . Vaping Use: Never used  Substance and Sexual Activity  . Alcohol use: No  . Drug use: No  . Sexual activity: Not on file  Other Topics Concern  . Not on file    Social History Narrative  . Not on file   Social Determinants of Health   Financial Resource Strain:   . Difficulty of Paying Living Expenses:   Food Insecurity:   . Worried About Charity fundraiser in the Last Year:   . Arboriculturist in the Last Year:   Transportation Needs:   . Film/video editor (Medical):   Marland Kitchen Lack of Transportation (Non-Medical):   Physical Activity:   . Days of Exercise per Week:   . Minutes of Exercise per Session:   Stress:   . Feeling of Stress :   Social Connections:   . Frequency of Communication with Friends and Family:   . Frequency of Social Gatherings with Friends and Family:   . Attends Religious Services:   . Active Member of Clubs or Organizations:   . Attends Archivist Meetings:   Marland Kitchen Marital Status:   Intimate Partner Violence:   . Fear of Current or Ex-Partner:   . Emotionally Abused:   Marland Kitchen Physically Abused:   . Sexually Abused:     Current Outpatient Medications  Medication Sig Dispense Refill  . acetaminophen (TYLENOL) 500 MG tablet Take 1,000 mg by mouth every 6 (six) hours as needed for mild pain or moderate pain.     Marland Kitchen albuterol (VENTOLIN HFA) 108 (90 Base) MCG/ACT inhaler Inhale 1-2 puffs into the lungs every 6 (six) hours as needed for wheezing or shortness of breath.    Marland Kitchen apixaban (ELIQUIS) 5 MG TABS tablet Take 1 tablet (5 mg total) by mouth 2 (two) times daily. 60 tablet 6  . atorvastatin (LIPITOR) 80 MG tablet Take 80 mg by mouth daily at 6 PM.     . carvedilol (COREG) 6.25 MG tablet Take 1 tablet (6.25 mg total) by mouth 2 (two) times daily with a meal. 60 tablet 5  . furosemide (LASIX) 20 MG tablet Take 1 tablet (20 mg total) by mouth as needed (weight. Take one tablet if weight increases by 3 pounds overnight or 5 pounds in one week.). (Patient taking differently: Take 20 mg by mouth daily as needed for fluid (weight. Take one tablet if weight increases by 3 pounds overnight or 5 pounds in one week.). ) 30  tablet 0  . ipratropium-albuterol (DUONEB) 0.5-2.5 (3) MG/3ML SOLN Take 3 mLs by nebulization every 6 (six) hours as needed. (Patient taking differently: Take 3 mLs by nebulization every 6 (six) hours as needed (wheezing/shortness of breath.). ) 360 mL 1  . ranolazine (RANEXA) 500 MG 12 hr tablet Take 500 mg by mouth 2 (two) times daily.    Marland Kitchen spironolactone (ALDACTONE) 25 MG tablet Take 0.5 tablets (12.5 mg total) by mouth at bedtime. (Patient taking differently: Take 12.5 mg by mouth daily after breakfast. ) 45 tablet 3  . tamsulosin (FLOMAX) 0.4 MG CAPS capsule Take 1 capsule (0.4 mg total)  by mouth daily after supper. 30 capsule 6  . venlafaxine XR (EFFEXOR-XR) 75 MG 24 hr capsule Take 1 capsule (75 mg total) by mouth daily with breakfast. 30 capsule 0   No current facility-administered medications for this visit.    No Known Allergies    Review of Systems:   General:  decreased appetite, decreased energy, no weight gain, no weight loss, no fever  Cardiac:  + chest pain with exertion, no chest pain at rest, +SOB with exertion, no resting SOB, no PND, no orthopnea, no palpitations, no arrhythmia, no atrial fibrillation, no LE edema, no dizzy spells, no syncope  Respiratory:  + exertional shortness of breath, + home oxygen, no productive cough, no dry cough, no bronchitis, no wheezing, no hemoptysis, no asthma, no pain with inspiration or cough, no sleep apnea, no CPAP at night  GI:   no difficulty swallowing, no reflux, no frequent heartburn, no hiatal hernia, no abdominal pain, no constipation, no diarrhea, no hematochezia, no hematemesis, no melena  GU:   no dysuria,  + frequency, no urinary tract infection, no hematuria, no enlarged prostate, no kidney stones, no kidney disease  Vascular:  + pain suggestive of claudication, no pain in feet, no leg cramps, no varicose veins, no DVT, no non-healing foot ulcer  Neuro:   Questionable h/o stroke, no TIA's, no seizures, no headaches, no  temporary blindness one eye,  no slurred speech, no peripheral neuropathy, no chronic pain, mild instability of gait, no memory/cognitive dysfunction  Musculoskeletal: no arthritis, no joint swelling, no myalgias, mild difficulty walking, somewhat decreased mobility   Skin:   no rash, no itching, no skin infections, no pressure sores or ulcerations  Psych:   no anxiety, no depression, no nervousness, no unusual recent stress  Eyes:   + blurry vision, no floaters, no recent vision changes, no wears glasses or contacts  ENT:   no hearing loss, no loose or painful teeth, partial dentures, last saw dentist < 6 months ago  Hematologic:  no easy bruising, no abnormal bleeding, no clotting disorder, no frequent epistaxis  Endocrine:  no diabetes, does not check CBG's at home           Physical Exam:   Ht 6' (1.829 m)   Wt 185 lb (83.9 kg)   BMI 25.09 kg/m   General:  Elderly,  well-appearing  HEENT:  Unremarkable   Neck:   no JVD, no bruits, no adenopathy   Chest:   clear to auscultation, symmetrical breath sounds, no wheezes, no rhonchi   CV:   RRR, grade II/VI crescendo/decrescendo murmur heard best at LLSB,  no diastolic murmur  Abdomen:  soft, non-tender, no masses   Extremities:  warm, well-perfused, pulses diminished but palpable, no LE edema  Rectal/GU  Deferred  Neuro:   Grossly non-focal and symmetrical throughout  Skin:   Clean and dry, no rashes, no breakdown   Diagnostic Tests:  ECHOCARDIOGRAM REPORT       Patient Name:  Kristopher Fernandez Date of Exam: 10/15/2019  Medical Rec #: [id number]   Height:    72.0 in  Accession #:  [id number]  Weight:    181.0 lb  Date of Birth: 11-21-1932   BSA:     2.04 m  Patient Age:  68 years   BP:      104/42 mmHg  Patient Gender: M       HR:      89 bpm.  Exam Location: [facility]  Procedure: 2D Echo, Cardiac Doppler and Color Doppler   Indications:  I63.00    History:     Patient has prior history of Echocardiogram examinations,  most         recent 05/10/2019. Previous Myocardial Infarction,  Pacemaker,         Stroke; AS Risk Factors:Hypertension, Dyslipidemia and  Former         Smoker.    Sonographer:  Coralyn Helling RDCS  Referring Phys: [id number] PRAMOD S SETHI     Sonographer Comments: Technically difficult study due to poor echo  windows.  IMPRESSIONS    1. Left ventricular ejection fraction, by visual estimation, is 20 to  25%. The left ventricle has severely decreased function. Mildly increased  left ventricular size. There is no left ventricular hypertrophy.  2. Left ventricular diastolic Doppler parameters are consistent with  impaired relaxation pattern of LV diastolic filling.  3. Global right ventricle has normal systolic function.The right  ventricular size is normal.  4. Left atrial size was mildly dilated.  5. Right atrial size was normal.  6. Mild mitral annular calcification.  7. The mitral valve is normal in structure. No evidence of mitral valve  regurgitation. No evidence of mitral stenosis.  8. The tricuspid valve is normal in structure. Tricuspid valve  regurgitation is trivial.  9. The aortic valve has an indeterminant number of cusps Aortic valve  regurgitation is trivial by color flow Doppler. Severe aortic valve  stenosis.  10. The pulmonic valve was normal in structure. Pulmonic valve  regurgitation is trivial by color flow Doppler.  11. Aortic dilatation noted.  12. There is mild dilatation of the ascending aorta measuring 39 mm.  13. A pacer wire is visualized.  14. The inferior vena cava is normal in size with greater than 50%  respiratory variability, suggesting right atrial pressure of 3 mmHg.  15. Severe global reduction in LV systolic function; grade 1 diastolic  dysfunction; mild LVE; mildly dilated ascending aorta; severe AS (AVA .8  cm2; DI .22); trace AI; mild LAE.     FINDINGS  Left Ventricle: Left ventricular ejection fraction, by visual estimation,  is 20 to 25%. The left ventricle has severely decreased function. There is  no left ventricular hypertrophy. Mildly increased left ventricular size.  Spectral Doppler shows Left  ventricular diastolic Doppler parameters are consistent with impaired  relaxation pattern of LV diastolic filling.   Right Ventricle: The right ventricular size is normal. Global RV systolic  function is has normal systolic function. The tricuspid regurgitant  velocity is 2.08 m/s, and with an assumed right atrial pressure of 10  mmHg, the estimated right ventricular  systolic pressure is normal at 27.4 mmHg.   Left Atrium: Left atrial size was mildly dilated.   Right Atrium: Right atrial size was normal in size   Pericardium: There is no evidence of pericardial effusion.   Mitral Valve: The mitral valve is normal in structure. Mild mitral annular  calcification. No evidence of mitral valve stenosis by observation. No  evidence of mitral valve regurgitation.   Tricuspid Valve: The tricuspid valve is normal in structure. Tricuspid  valve regurgitation is trivial by color flow Doppler.   Aortic Valve: The aortic valve has an indeterminant number of cusps.  Aortic valve regurgitation is trivial by color flow Doppler. Aortic  regurgitation PHT measures 490 msec. Severe aortic stenosis is present.  Aortic valve mean gradient measures 14.2  mmHg. Aortic valve peak gradient measures 25.7 mmHg. Aortic valve  area, by  VTI measures 0.78 cm.   Pulmonic Valve: The pulmonic valve was normal in structure. Pulmonic valve  regurgitation is trivial by color flow Doppler.   Aorta: Aortic dilatation noted. There is mild dilatation of the ascending  aorta measuring 39 mm.   Venous: The inferior vena cava is normal in size with greater than 50%  respiratory variability, suggesting right atrial pressure of 3 mmHg.   IAS/Shunts: No  atrial level shunt detected by color flow Doppler.   Additional Comments: A pacer wire is visualized.    LEFT VENTRICLE  PLAX 2D  LVIDd:     5.50 cm  LVIDs:     4.90 cm  LV PW:     1.10 cm  LV IVS:    1.00 cm  LVOT diam:   2.20 cm  LV SV:     35 ml  LV SV Index:  16.90  LVOT Area:   3.80 cm     RIGHT VENTRICLE      IVC  RVSP:      20.4 mmHg IVC diam: 1.50 cm   LEFT ATRIUM       Index    RIGHT ATRIUM      Index  LA diam:    3.50 cm 1.71 cm/m RA Pressure: 3.00 mmHg  LA Vol (A2C):  85.4 ml 41.82 ml/m RA Area:   16.90 cm  LA Vol (A4C):  61.3 ml 30.02 ml/m RA Volume:  47.80 ml 23.41 ml/m  LA Biplane Vol: 72.6 ml 35.55 ml/m  AORTIC VALVE  AV Area (Vmax):  0.76 cm  AV Area (Vmean):  0.75 cm  AV Area (VTI):   0.78 cm  AV Vmax:      253.40 cm/s  AV Vmean:     175.000 cm/s  AV VTI:      0.566 m  AV Peak Grad:   25.7 mmHg  AV Mean Grad:   14.2 mmHg  LVOT Vmax:     50.38 cm/s  LVOT Vmean:    34.640 cm/s  LVOT VTI:     0.117 m  LVOT/AV VTI ratio: 0.21  AI PHT:      490 msec    AORTA  Ao Root diam: 3.50 cm  Ao Asc diam: 3.90 cm   MITRAL VALVE             TRICUSPID VALVE  MV Area (PHT):            TR Peak grad:  17.4 mmHg                    TR Vmax:    209.00 cm/s  MV Decel Time: 408 msec       Estimated RAP: 3.00 mmHg  MV E velocity: 53.76 cm/s 103 cm/s RVSP:      20.4 mmHg  MV A velocity: 117.60 cm/s 70.3 cm/s  MV E/A ratio: 0.46    1.5    SHUNTS                    Systemic VTI: 0.12 m                    Systemic Diam: 2.20 cm     Kirk Ruths MD  Electronically signed by Kirk Ruths MD  Signature Date/Time: 10/15/2019/4:02:14 PM        ECHOCARDIOGRAM REPORT       Patient Name:  Kristopher Fernandez Date of Exam: 01/21/2020  Medical  Rec #: [id number]  Height:    72.0 in  Accession #:  [id number]  Weight:    184.0 lb  Date of Birth: March 22, 1932  BSA:     2.06 m  Patient Age:  75 years   BP:      125/71 mmHg  Patient Gender: M      HR:      65 bpm.  Exam Location: Outpatient   Procedure: Dobutamine Stress   Indications:  Aortic Stenosis    History:    Patient has prior history of Echocardiogram examinations,  most         recent 10/15/2019. CAD and Previous Myocardial Infarction,         Pacemaker, COPD and Stroke; Risk Factors:Hypertension and         Dyslipidemia. Ischemic Cardiomyoapthy. Chronic kidney  disease.    Sonographer:  Darlina Sicilian RDCS  Referring Phys: [place] Comments: Dobutamine stress test for Fernandez flow stenosis  IMPRESSIONS    1. Left ventricular ejection fraction, by visual estimation, is 25 to  30%. The left ventricle has moderate to severely decreased function. There  is no left ventricular hypertrophy.  2. Moderately dilated left ventricular internal cavity size.  3. The left ventricle demonstrates regional wall motion abnormalities.  4. Diffuse hypokinesis with mid and inferior wall akinesis.  5. Global right ventricle has mildly reduced systolic function.The right  ventricular size is mildly enlarged. No increase in right ventricular wall  thickness.  6. Left atrial size was moderately dilated.  7. Right atrial size was normal.  8. Mild mitral annular calcification.  9. The mitral valve is degenerative. Mild mitral valve regurgitation.  10. The tricuspid valve is not well visualized.  11. The tricuspid valve is not well visualized. Tricuspid valve  regurgitation Not interogated.  12. The aortic valve is tricuspid. Aortic valve regurgitation is trivial.  Moderate to severe aortic valve stenosis.  13. Dobutamine echo done from 5-20 ug/kg/min Resting gradient was mean  13  mmHg peak 25 mmHg with DVI 0.31 and AVA 1.1 cm2 Highest gradients had high  dose dobutamine showed peak velocity of 4.2 m/sec mean gradient 34 mmHg  peak 72 mmHg with DVI 0.28 and  AVA of 1 cm2 Overall findings consistent with moderate to severe aortic  stenosis.  14. Pulmonic regurgitation is mild.  15. The pulmonic valve was not well visualized. Pulmonic valve  regurgitation is mild.  16. The aortic root was not well visualized.  17. The interatrial septum was not assessed.   FINDINGS  Left Ventricle: Left ventricular ejection fraction, by visual estimation,  is 25 to 30%. The left ventricle has moderate to severely decreased  function. The left ventricle demonstrates regional wall motion  abnormalities. The left ventricular internal  cavity size was moderately dilated left ventricle. There is no left  ventricular hypertrophy. Diffuse hypokinesis with mid and inferior wall  akinesis.   Right Ventricle: The right ventricular size is mildly enlarged. No  increase in right ventricular wall thickness. Global RV systolic function  is has mildly reduced systolic function.   Left Atrium: Left atrial size was moderately dilated.   Right Atrium: Right atrial size was normal in size   Pericardium: There is no evidence of pericardial effusion.   Mitral Valve: The mitral valve is degenerative in appearance. There is  mild thickening of the mitral valve leaflet(s). There is mild  calcification of the mitral valve leaflet(s). Mild mitral annular  calcification. Mild mitral valve regurgitation.   Tricuspid Valve: The tricuspid valve is not well visualized. Tricuspid  valve regurgitation Not interogated.   Aortic Valve: The aortic valve is tricuspid. Aortic valve regurgitation is  trivial. Moderate to severe aortic stenosis is present. Aortic valve mean  gradient measures 20.0 mmHg. Aortic valve peak gradient measures 39.7  mmHg. Aortic valve area, by VTI  measures 1.03 cm.  Dobutamine echo done from 5-20 ug/kg/min Resting  gradient was mean 13 mmHg peak 25 mmHg with DVI 0.31 and AVA 1.1 cm2  Highest gradients had high dose dobutamine showed peak velocity of 4.2  m/sec mean gradient 34 mmHg peak 72 mmHg with  DVI 0.28 and AVA of 1 cm2 Overall findings consistent with moderate to  severe aortic stenosis.   Pulmonic Valve: The pulmonic valve was not well visualized. Pulmonic valve  regurgitation is mild. Pulmonic regurgitation is mild.   Aorta: The aortic root was not well visualized.   IAS/Shunts: The interatrial septum was not assessed.     LEFT VENTRICLE  PLAX 2D  LVOT diam:   2.15 cm  LVOT Area:   3.63 cm     AORTIC VALVE  AV Area (Vmax):  0.95 cm  AV Area (Vmean):  0.97 cm  AV Area (VTI):   1.03 cm  AV Vmax:      315.00 cm/s  AV Vmean:     210.000 cm/s  AV VTI:      0.633 m  AV Peak Grad:   39.7 mmHg  AV Mean Grad:   20.0 mmHg  LVOT Vmax:     82.60 cm/s  LVOT Vmean:    56.300 cm/s  LVOT VTI:     0.179 m  LVOT/AV VTI ratio: 0.28     SHUNTS  Systemic VTI: 0.18 m  Systemic Diam: 2.15 cm     Jenkins Rouge MD  Electronically signed by Jenkins Rouge MD  Signature Date/Time: 01/21/2020/1:39:54 PM       TRANSESOPHOGEAL ECHO REPORT       Patient Name:  Kristopher Fernandez Date of Exam: 05/14/2020  Medical Rec #: [id number]  Height:    72.0 in  Accession #:  [id number]  Weight:    186.6 lb  Date of Birth: Apr 04, 1932  BSA:     2.069 m  Patient Age:  28 years   BP:      120/80 mmHg  Patient Gender: M      HR:      70 bpm.  Exam Location: Inpatient   Procedure: Transesophageal Echo   Indications:  Aortic stenosis    History:    Patient has prior history of Echocardiogram examinations.    Sonographer:  Vikki Ports Turrentine  Referring Phys: [place]: The transesophogeal probe was passed without difficulty through    the esophogus of the patient. Sedation performed by different physician.  The patient developed no complications during the procedure.   IMPRESSIONS    1. Left ventricular ejection fraction, by estimation, is 20 to 25%. The  left ventricle has severely decreased function. The left ventricle  demonstrates global hypokinesis. The left ventricular internal cavity size  was moderately dilated.  2. Right ventricular systolic function is mildly reduced. The right  ventricular size is mildly enlarged.  3. Left atrial size was moderately dilated. No left atrial/left atrial  appendage thrombus was detected.  4. Right atrial size was mildly dilated. No ASD or PFO by color doppler.  5. The mitral  valve is normal in structure. Trivial mitral valve  regurgitation. No evidence of mitral stenosis.  6. The aortic valve is tricuspid. Aortic valve regurgitation is trivial.  Severe aortic valve stenosis. Aortic valve area, by VTI measures 0.56 cm.  Aortic valve mean gradient measures 27.0 mmHg. Dimensionless index 0.24.  7. Grade 3 plaque in the descending thoracic aorta and arch.   FINDINGS  Left Ventricle: Left ventricular ejection fraction, by estimation, is 20  to 25%. The left ventricle has severely decreased function. The left  ventricle demonstrates global hypokinesis. The left ventricular internal  cavity size was moderately dilated.  There is no left ventricular hypertrophy.   Right Ventricle: The right ventricular size is mildly enlarged. No  increase in right ventricular wall thickness. Right ventricular systolic  function is mildly reduced.   Left Atrium: Left atrial size was moderately dilated. No left atrial/left  atrial appendage thrombus was detected.   Right Atrium: Right atrial size was mildly dilated.   Pericardium: There is no evidence of pericardial effusion.   Mitral Valve: The mitral valve is normal in structure. Mild mitral annular  calcification. Trivial  mitral valve regurgitation. No evidence of mitral  valve stenosis.   Tricuspid Valve: The tricuspid valve is normal in structure. Tricuspid  valve regurgitation is mild.   Aortic Valve: The aortic valve is tricuspid. Aortic valve regurgitation is  trivial. Severe aortic stenosis is present. Aortic valve mean gradient  measures 27.0 mmHg. Aortic valve peak gradient measures 40.7 mmHg. Aortic  valve area, by VTI measures 0.56  cm.   Pulmonic Valve: The pulmonic valve was normal in structure. Pulmonic valve  regurgitation is trivial.   Aorta: Grade 3 plaque in the descending thoracic aorta and arch. The  aortic root is normal in size and structure.   IAS/Shunts: No atrial level shunt detected by color flow Doppler.   Additional Comments: A pacer wire is visualized in the right ventricle.     LEFT VENTRICLE  PLAX 2D  LVIDd:     7.20 cm  LVOT diam:   1.90 cm  LV SV:     43  LV SV Index:  21  LVOT Area:   2.84 cm     AORTIC VALVE  AV Area (Vmax):  0.69 cm  AV Area (Vmean):  0.58 cm  AV Area (VTI):   0.56 cm  AV Vmax:      319.00 cm/s  AV Vmean:     252.000 cm/s  AV VTI:      0.771 m  AV Peak Grad:   40.7 mmHg  AV Mean Grad:   27.0 mmHg  LVOT Vmax:     77.20 cm/s  LVOT Vmean:    51.200 cm/s  LVOT VTI:     0.151 m  LVOT/AV VTI ratio: 0.20   TRICUSPID VALVE  TR Peak grad:  27.2 mmHg  TR Vmax:    261.00 cm/s    SHUNTS  Systemic VTI: 0.15 m  Systemic Diam: 1.90 cm   Loralie Champagne MD  Electronically signed by Loralie Champagne MD  Signature Date/Time: 05/14/2020/10:48:39 AM       RIGHT HEART CATH AND CORONARY ANGIOGRAPHY  Conclusion  1. Preserved cardiac output.  2. Fernandez filling pressures.  3. Mild pulmonary hypertension.  4. 80% stenosis mid PLV branch off RCA (known from prior cath).  5. Known occlusion of LCx after OM1.  6. Moderate disease in LAD, 50% proximal and 50-60% distal.   7. Known  severe AS from  echo and TEE, valve not crossed.   Referred for TAVR evaluation.  Surgeon Notes    05/14/2020 10:27 AM CV Procedure signed by Larey Dresser, MD  Procedural Details  Technical Details Procedure: Right Heart Cath, Selective Coronary Angiography  Indication: Aortic stenosis.    Procedural Details: The right brachial and radial areas were prepped, draped, and anesthetized with 1% lidocaine. There was a pre-existing peripheral IV that was replaced with a 27F venous sheath. A Swan-Ganz catheter was used for the right heart catheterization. Standard protocol was followed for recording of right heart pressures and sampling of oxygen saturations. Fick cardiac output was calculated. The right radial artery was entered using modified Seldinger technique and a 81F sheath was placed. Standard Judkins catheters were used for selective coronary angiography. There were no immediate procedural complications. The patient was transferred to the post catheterization recovery area for further monitoring.   Estimated blood loss <50 mL.   During this procedure medications were administered to achieve and maintain moderate conscious sedation while the patient's heart rate, blood pressure, and oxygen saturation were continuously monitored and I was present face-to-face 100% of this time.  Medications (Filter: Administrations occurring from 0820 to 1010 on 05/22/20) (important) Continuous medications are totaled by the amount administered until 05/22/20 1010.  Heparin (Porcine) in NaCl 1000-0.9 UT/500ML-% SOLN (mL) Total volume:  1,000 mL Date/Time  Rate/Dose/Volume Action  05/22/20 0840  500 mL Given  0840  500 mL Given    fentaNYL (SUBLIMAZE) injection (mcg) Total dose:  25 mcg Date/Time  Rate/Dose/Volume Action  05/22/20 0854  25 mcg Given    midazolam (VERSED) injection (mg) Total dose:  1 mg Date/Time  Rate/Dose/Volume Action  05/22/20 0855  1 mg Given    lidocaine (PF) (XYLOCAINE)  1 % injection (mL) Total volume:  4 mL Date/Time  Rate/Dose/Volume Action  05/22/20 0923  2 mL Given  0937  2 mL Given    Radial Cocktail/Verapamil only (mL) Total volume:  10 mL Date/Time  Rate/Dose/Volume Action  05/22/20 0939  10 mL Given    heparin sodium (porcine) injection (Units) Total dose:  4,000 Units Date/Time  Rate/Dose/Volume Action  05/22/20 0940  4,000 Units Given    iohexol (OMNIPAQUE) 350 MG/ML injection (mL) Total volume:  90 mL Date/Time  Rate/Dose/Volume Action  05/22/20 1000  90 mL Given    Sedation Time  Sedation Time Physician-1: 1 hour 2 minutes 49 seconds  Radiation/Fluoro  Fluoro time: 19.6 (min) DAP: 38466 (mGycm2) Cumulative Air Kerma: 333 (mGy)  Coronary Findings  Diagnostic Dominance: Right Left Main  30% distal left main stenosis.  Left Anterior Descending  50% proximal LAD stenosis at D1. Moderate D1 with 80% ostial stenosis. 50-60% distal LAD stenosis.  Left Circumflex  Totally occluded after OM1 (chronic).  Right Coronary Artery  40-50% ostial RCA stenosis. Patent proximal RCA stent. Diffuse 30-40% disease mid-distal RCA. 90% stenosis mid-PLV.  Intervention  No interventions have been documented. Right Heart  Right Heart Pressures RHC Procedural Findings: Hemodynamics (mmHg) RA mean 4 RV 47/4 PA 43/9, mean 21 PCWP mean 9 AO 126/52  Oxygen saturations: PA 62% AO 99%  Cardiac Output (Fick) 4.8  Cardiac Index (Fick) 2.34 PVR 2.5 WU  Implants   No implant documentation for this case.  Syngo Images  Show images for CARDIAC CATHETERIZATION Images on Long Term Storage  Show images for [place], [place] to Procedure Log  Procedure Log    Hemo Data   Most Recent Value  Fick Cardiac Output 4.8 L/min  Fick Cardiac Output Index 2.34 (L/min)/BSA  RA A Wave 6 mmHg  RA V Wave 3 mmHg  RA Mean 4 mmHg  RV Systolic Pressure 47 mmHg  RV Diastolic Pressure 0 mmHg  RV EDP 4 mmHg  PA Systolic Pressure 43 mmHg  PA  Diastolic Pressure 9 mmHg  PA Mean 21 mmHg  PW A Wave 10 mmHg  PW V Wave 13 mmHg  PW Mean 9 mmHg  AO Systolic Pressure 950 mmHg  AO Diastolic Pressure 52 mmHg  AO Mean 80 mmHg  QP/QS 1  TPVR Index 8.97 HRUI  TSVR Index 34.19 HRUI  PVR SVR Ratio 0.15  TPVR/TSVR Ratio 0.26      Cardiac TAVR CT  TECHNIQUE: The patient was scanned on a Siemens Force 932 slice scanner. A 120 kV retrospective scan was triggered in the descending thoracic aorta at 111 HU's. Gantry rotation speed was 270 msecs and collimation was .9 mm. No beta blockade or nitro were given. The 3D data set was reconstructed in 5% intervals of the R-R cycle. Systolic and diastolic phases were analyzed on a dedicated work station using MPR, MIP and VRT modes. The patient received 129mL OMNIPAQUE IOHEXOL 350 MG/ML SOLN of contrast.  FINDINGS: Significant motion artifact impacts diagnostic accuracy of this exam.  Aortic Valve: Tricuspid aortic valve. Severely reduced cusp separation. Moderately thickened, severely calcified aortic valve cusps.  AV calcium score: 2742. Quantitation impacted by metallic artifact from device leads and coronary stent.  To mitigate effects of motion artifact, measurements made at 40% R-R interval.  Virtual Basal Annulus Measurements:  Maximum/Minimum Diameter: 30 x 23.2 mm  Perimeter: 82.8 mm  Area: 523 mm2  No significant LVOT calcifications.  Based on these measurements, the annulus would be suitable for a 26 mm Sapien 3 valve.  Sinus of Valsalva Measurements:  Non-coronary:  34 mm  Right - coronary:  33 mm  Left - coronary:  34 mm  Sinus of Valsalva Height:  Left: 21 mm  Right: 23.6 mm  Aorta: Severe mixed atherosclerotic plaque in the entire thoracic aorta, most prominently in the ascending aorta.  Sinotubular Junction:  30 mm  Ascending Thoracic Aorta: 37 mm at the mid level, 39 mm at the distal ascending aorta.  Aortic Arch:  33  mm  Descending Thoracic Aorta:  35 mm  Coronary Artery Height above Annulus:  Left Main: 15 mm  Right Coronary: 16.5 mm to the orifice of the ostial RCA stent which protrudes into the aorta.  Coronary Arteries: 3 vessel coronary artery disease with coronary stent in RCA, crossing the ostium into the aorta.  Optimum Fluoroscopic Angle for Delivery: LAO 24, CAU 22  Dual chamber permanent pacemaker with left chest wall generator.  IMPRESSION: Significant motion artifact at the annulus may impact diagnostic accuracy of measurements for TAVR.  1. Tricuspid aortic valve. Severely reduced cusp separation. Moderately thickened, severely calcified aortic valve cusps.  2. AV calcium score: 2742.  3. Annulus area 523 mm, suitable for a 26 mm Sapien 3 valve.  4. Adequate coronary artery heights from annulus, however RCA stent crosses ostium and protrudes into aorta.  5. No left atrial appendage thrombus.  6. Optimum Fluoroscopic Angle for Delivery: LAO 24, CAU 22  7. Severe mixed atherosclerotic plaque in the thoracic aorta.   Electronically Signed   By: Cherlynn Kaiser   On: 06/01/2020 21:44    CT ANGIOGRAPHY CHEST, ABDOMEN AND PELVIS  TECHNIQUE: Non-contrast CT of the  chest was initially obtained.  Multidetector CT imaging through the chest, abdomen and pelvis was performed using the standard protocol during bolus administration of intravenous contrast. Multiplanar reconstructed images and MIPs were obtained and reviewed to evaluate the vascular anatomy.  CONTRAST:  133mL OMNIPAQUE IOHEXOL 350 MG/ML SOLN  COMPARISON:  PET-CT 02/21/2020. CT the abdomen and pelvis 01/09/2020. Chest CT 12/21/2017. Chest CTA 10/08/2016.  FINDINGS: CTA CHEST FINDINGS  Cardiovascular: Heart size is mildly enlarged. There is no significant pericardial fluid, thickening or pericardial calcification. There is aortic atherosclerosis, as well as atherosclerosis of  the great vessels of the mediastinum and the coronary arteries, including calcified atherosclerotic plaque in the left main, left anterior descending, left circumflex and right coronary arteries. Severe thickening calcification of the aortic valve. Aneurysmal dilatation of the ascending thoracic aorta (4.5 cm in diameter).  Mediastinum/Lymph Nodes: No pathologically enlarged mediastinal or hilar lymph nodes. Esophagus is unremarkable in appearance. No axillary lymphadenopathy.  Lungs/Pleura: No suspicious appearing pulmonary nodules or masses are noted. A few scattered tiny calcified granulomas are incidentally noted. No acute consolidative airspace disease. No pleural effusions.  Musculoskeletal/Soft Tissues: There are no aggressive appearing lytic or blastic lesions noted in the visualized portions of the skeleton.  CTA ABDOMEN AND PELVIS FINDINGS  Hepatobiliary: No suspicious cystic or solid hepatic lesions. No intra or extrahepatic biliary ductal dilatation. Large calcified gallstone measuring 2.1 cm in diameter. Gallbladder is otherwise unremarkable in appearance.  Pancreas: In the head of the pancreas there is a well-defined 2.3 x 1.5 x 1.9 cm intermediate attenuation (40 HU) lesion (axial image 111 of series 18 and coronal image 91 of series 20), similar to the prior examination from 01/09/2020. No other new pancreatic mass. No pancreatic ductal dilatation. No peripancreatic fluid collections or inflammatory changes.  Spleen: Unremarkable.  Adrenals/Urinary Tract: Fernandez-attenuation nonenhancing lesions in both kidneys compatible with simple cysts, largest of which is in the medial aspect of the upper pole the left kidney measuring 4.1 cm in diameter. Other subcentimeter Fernandez-attenuation lesions in both kidneys, too small to characterize, but statistically likely to represent cysts. In addition, in the lower pole of the right kidney there is a 2.7 cm  Fernandez-attenuation lesion which has a small focus of calcification along the superior/posterior wall (axial image 111 of series 18), stable compared to the prior study (Bosniak class 2 cysts). 9 mm calcification in the distal third of the right ureter shortly before the right ureterovesicular junction (axial image 181 of series 18). No hydroureteronephrosis. Urinary bladder is normal in appearance. Bilateral adrenal glands are normal in appearance.  Stomach/Bowel: The appearance of the stomach is normal. No pathologic dilatation of small bowel or colon. Numerous colonic diverticulae are noted, without surrounding inflammatory changes to suggest an acute diverticulitis at this time. Normal appendix.  Vascular/Lymphatic: Aortic atherosclerosis, without evidence of aneurysm or dissection in the abdominal or pelvic vasculature. No lymphadenopathy noted in the abdomen or pelvis.  Reproductive: Prostate gland and seminal vesicles are grossly unremarkable in appearance.  Other: No significant volume of ascites.  No pneumoperitoneum.  Musculoskeletal: There are no aggressive appearing lytic or blastic lesions noted in the visualized portions of the skeleton.  VASCULAR MEASUREMENTS PERTINENT TO TAVR:  AORTA:  Minimal Aortic Diameter-13 x 11 mm  Severity of Aortic Calcification-moderate  RIGHT PELVIS:  Right Common Iliac Artery -  Minimal Diameter-9.8 x 8.7 mm  Tortuosity-mild  Calcification-mild  Right External Iliac Artery -  Minimal Diameter-6.4 x 6.4 mm  Tortuosity-moderate  Calcification-minimal  Right Common  Femoral Artery -  Minimal Diameter-6.2 x 6.6 mm  Tortuosity-mild  Calcification-mild  LEFT PELVIS:  Left Common Iliac Artery -  Minimal Diameter-7.2 x 5.9 mm  Tortuosity-mild  Calcification-mild  Left External Iliac Artery -  Minimal Diameter-6.8 x 6.0 mm  Tortuosity-moderate  Calcification-mild  Left Common  Femoral Artery -  Minimal Diameter-7.4 x 5.0 mm  Tortuosity-mild  Calcification-mild  Review of the MIP images confirms the above findings.  IMPRESSION: 1. Vascular findings and measurements pertinent to potential TAVR procedure, as detailed above. 2. Severe thickening calcification of the aortic valve, compatible with the reported clinical history of severe aortic stenosis. 3. Aortic atherosclerosis, in addition to left main and 3 vessel coronary artery disease. In addition, there is aneurysmal dilatation of the ascending thoracic aorta (4.5 cm in diameter). Ascending thoracic aortic aneurysm. Recommend semi-annual imaging followup by CTA or MRA and referral to cardiothoracic surgery if not already obtained. This recommendation follows 2010 ACCF/AHA/AATS/ACR/ASA/SCA/SCAI/SIR/STS/SVM Guidelines for the Diagnosis and Management of Patients With Thoracic Aortic Disease. Circulation. 2010; 121: Y101-B510. Aortic aneurysm NOS (ICD10-I71.9). 4. Mild cardiomegaly. 5. Cholelithiasis without evidence of acute cholecystitis at this time. 6. Stable Fernandez-attenuation lesion in the head of the pancreas which remains incompletely characterized on today's examination. Given the lack of hypermetabolism on the prior PET-CT, this may represent a benign lesion, but further characterization with nonemergent abdominal MRI with and without IV gadolinium with MRCP is suggested to definitively characterize this lesion. 7. Colonic diverticulosis without evidence of acute diverticulitis at this time. 8. Additional incidental findings, as above.   Electronically Signed   By: Vinnie Langton M.D.   On: 05/30/2020 14:46    EKG: Atrial paced rhythm (05/07/2020)   Impression:  Patient has stage D2 severe symptomatic Fernandez-flow Fernandez gradient aortic stenosis.  He describes a steady decline in exercise tolerance with worsening symptoms of exertional shortness of breath and fatigue consistent with  chronic systolic congestive heart failure, [place] Heart Association functional class III.  Occasionally with symptoms of shortness of breath he also has mild tightness across his chest.  I have personally reviewed the patient's recent echocardiograms, diagnostic cardiac catheterization, and CT angiograms.  Echocardiograms demonstrate classical findings of Fernandez-flow Fernandez gradient aortic stenosis.  The patient has severe global left ventricular systolic dysfunction with ejection fraction estimated between 20 and 30%.  The aortic valve is trileaflet with moderately severe restricted leaflet mobility involving all 3 leaflets.  Although peak velocity across aortic valve remains less than 4 m/s with mean transvalvular gradients estimated less than 20 mmHg at rest, the DVI is quite Fernandez and stroke-volume index measured only 21 with aortic valve area calculated only 0.56 cm on recent TEE.  Diagnostic cardiac catheterization reveals stable multivessel coronary artery disease with chronic occlusion of left circumflex coronary artery and moderate nonobstructive disease in the left anterior descending coronary artery and 80% ostial stenosis of posterolateral branch of the terminal portion of the right coronary artery.    I agree the patient would benefit from aortic valve replacement.  Risks associated with conventional surgery would be at least moderately elevated because of the patient's advanced age and numerous comorbid medical problems.  Cardiac-gated CTA of the heart reveals anatomical characteristics consistent with aortic stenosis suitable for treatment by transcatheter aortic valve replacement without any significant complicating features and CTA of the aorta and iliac vessels demonstrate what appears to be adequate pelvic vascular access to facilitate a transfemoral approach.  Patient already has a permanent pacemaker in place.  Plan:  The patient and his daughter were counseled at length regarding  treatment alternatives for management of severe symptomatic aortic stenosis. Alternative approaches such as conventional aortic valve replacement, transcatheter aortic valve replacement, and continued medical therapy without intervention were compared and contrasted at length.  The risks associated with conventional surgical aortic valve replacement were discussed in detail, as were expectations for post-operative convalescence, and why I would be reluctant to consider this patient a candidate for conventional surgery.  Issues specific to transcatheter aortic valve replacement were discussed including questions about long term valve durability, the potential for paravalvular leak, possible increased risk of need for permanent pacemaker placement, and other technical complications related to the procedure itself.  Long-term prognosis with medical therapy was discussed. This discussion was placed in the context of the patient's own specific clinical presentation and past medical history.  All of their questions have been addressed.  The patient desires to proceed with transcatheter aortic valve replacement as soon as possible.  We tentatively plan to proceed with surgery on June 17, 2020.  The patient has been instructed to stop taking Eliquis at least 5 days prior to surgery.  Following the decision to proceed with transcatheter aortic valve replacement, a discussion has been held regarding what types of management strategies would be attempted intraoperatively in the event of life-threatening complications, including whether or not the patient would be considered a candidate for the use of cardiopulmonary bypass and/or conversion to open sternotomy for attempted surgical intervention.  The patient specifically requests that should a potentially life-threatening complication develop we would attempt emergency median sternotomy and/or other aggressive surgical procedures if the patient had a technical complication  that was felt to be correctable.  The patient has been advised of a variety of complications that might develop including but not limited to risks of death, stroke, paravalvular leak, aortic dissection or other major vascular complications, aortic annulus rupture, device embolization, cardiac rupture or perforation, mitral regurgitation, acute myocardial infarction, arrhythmia, heart block or bradycardia requiring permanent pacemaker placement, congestive heart failure, respiratory failure, renal failure, pneumonia, infection, other late complications related to structural valve deterioration or migration, or other complications that might ultimately cause a temporary or permanent loss of functional independence or other long term morbidity.  The patient provides full informed consent for the procedure as described and all questions were answered.      I spent in excess of 90 minutes during the conduct of this office consultation and >50% of this time involved direct face-to-face encounter with the patient for counseling and/or coordination of their care.      Valentina Gu. Roxy Manns, MD 06/09/2020 9:33 AM

## 2020-06-09 NOTE — Patient Instructions (Signed)
Stop taking Eliquis 5 days prior to surgery  Continue taking all other medications without change through the day before surgery.  Make sure to bring all of your medications with you when you come for your Pre-Admission Testing appointment at [facility] Short-Stay Department.  Have nothing to eat or drink after midnight the night before surgery.  On the morning of surgery do not take any medications.  You may use your inhaler if desired.  At your appointment for Pre-Admission Testing at the [facility] Short-Stay Department you will be asked to sign permission forms for your upcoming surgery.  By definition your signature on these forms implies that you and/or your designee provide full informed consent for your planned surgical procedure(s), that alternative treatment options have been discussed, that you understand and accept any and all potential risks, and that you have some understanding of what to expect for your post-operative convalescence.  For any major cardiac surgical procedure potential operative risks include but are not limited to at least some risk of death, stroke or other neurologic complication, myocardial infarction, congestive heart failure, respiratory failure, renal failure, bleeding requiring blood transfusion and/or reexploration, irregular heart rhythm, heart block or bradycardia requiring permanent pacemaker, pneumonia, pericardial effusion, pleural effusion, wound infection, pulmonary embolus or other thromboembolic complication, chronic pain, or other complications related to the specific procedure(s) performed.  For transcatheter aortic valve replacement additional risks include but are not limited to risk of paravalvular leak, valve embolization, valve thrombosis, aortic dissection, aortic rupture, ventricular septal defect or perforation, pericardial tamponade, injury of the abdominal aorta or its branches, and/or injury or occlusion of  the arteries going to your arms or legs.  Please call to schedule a follow-up appointment in our office prior to surgery if you have any unresolved questions about your planned surgical procedure, the associated risks, alternative treatment options, and/or expectations for your post-operative recovery.

## 2020-06-09 NOTE — Therapy (Signed)
[place], [place], [address] Phone: [phone]   Fax:  [phone]  Physical Therapy Evaluation  Patient Details  Name: Kristopher Fernandez MRN: [id number] Date of Birth: 1932-06-02 Referring Provider (PT): Lauree Chandler, MD   Encounter Date: 06/09/2020   PT End of Session - 06/09/20 1140    Visit Number 1    Number of Visits 1    Date for PT Re-Evaluation --   NA   Authorization Type [employer] Medicare    PT Start Time 7517    PT Stop Time 1129    PT Time Calculation (min) 44 min    Equipment Utilized During Treatment Gait belt    Activity Tolerance Patient limited by fatigue    Behavior During Therapy [facility] for tasks assessed/performed           Past Medical History:  Diagnosis Date  . Anginal pain ([place])   . Aortic stenosis   . Basal cell carcinoma, ear 03/31/2015  . Bradycardia 07/03/2014  . CHF (congestive heart failure) ([place])   . DDD (degenerative disc disease), lumbar 08/06/2015  . H/O cardiac catheterization 07/03/2014   Overview:  Cypher stent proximal and distal RCA 07/18/05 and TAXUS stent mid LAD 07/20/05 at [facility]  . HTN (hypertension) 07/03/2014  . Hyperlipidemia 07/03/2014  . Lumbosacral radiculopathy at S1 08/22/2015  . MI (myocardial infarction) ([place]) 07/03/2014   Overview:  Mi 07/17/05  . Myocardial infarction ([place])   . Normocytic normochromic anemia 09/14/2015  . Pacemaker 07/03/2014   Overview:  Dual chamber pacemaker 08/11/11  . Pancreatic mass    not hypermetabolic on PET-CT imaging  . Prostate cancer ([place])    metastatic  . Shortness of breath dyspnea   . SOB (shortness of breath) on exertion 05/13/2016    Past Surgical History:  Procedure Laterality Date  . CARDIAC CATHETERIZATION Bilateral 07/14/2016   Procedure: Right/Left Heart Cath and Coronary Angiography;  Surgeon: Isaias Cowman, MD;  Location: [place] CV LAB;  Service: Cardiovascular;  Laterality: Bilateral;  .  CORONARY ANGIOPLASTY    . HERNIA REPAIR    . LEFT HEART CATH AND CORONARY ANGIOGRAPHY N/A 05/09/2019   Procedure: LEFT HEART CATH AND CORONARY ANGIOGRAPHY;  Surgeon: Corey Skains, MD;  Location: [place] [facility];  Service: Cardiovascular;  Laterality: N/A;  . PROSTATE BIOPSY N/A 12/11/2019   Procedure: PROSTATE BIOPSY;  Surgeon: Abbie Sons, MD;  Location: [facility] ORS;  Service: Urology;  Laterality: N/A;  . RIGHT HEART CATH N/A 05/17/2019   Procedure: RIGHT HEART CATH;  Surgeon: Larey Dresser, MD;  Location: [place] CV [facility];  Service: Cardiovascular;  Laterality: N/A;  . RIGHT HEART CATH AND CORONARY ANGIOGRAPHY N/A 05/22/2020   Procedure: RIGHT HEART CATH AND CORONARY ANGIOGRAPHY;  Surgeon: Larey Dresser, MD;  Location: [place] [facility];  Service: Cardiovascular;  Laterality: N/A;  . TEE WITHOUT CARDIOVERSION N/A 05/14/2020   Procedure: TRANSESOPHAGEAL ECHOCARDIOGRAM (TEE);  Surgeon: Larey Dresser, MD;  Location: [facility] ENDOSCOPY;  Service: Cardiovascular;  Laterality: N/A;  . TRANSRECTAL ULTRASOUND N/A 12/11/2019   Procedure: TRANSRECTAL ULTRASOUND;  Surgeon: Abbie Sons, MD;  Location: [facility] ORS;  Service: Urology;  Laterality: N/A;    There were no vitals filed for this visit.    Subjective Assessment - 06/09/20 1133    Subjective Pt. is a 86 y/o male referred to PT for pre-TVAR procedure evaluation. He reports approximately 6 month history of worsening shortness of breath worse after  receiving radiation treatment for prostate CA.    Patient is accompained by: Family member   daughter   Pertinent History MI, CHF, Pacemaker, prostate CA s/p radiation    Limitations Standing;Walking    Patient Stated Goals Get heart better    Currently in Pain? No/denies              [facility] PT Assessment - 06/09/20 0001      Assessment   Medical Diagnosis Severe aortic stenosis    Referring Provider (PT) Lauree Chandler, MD    Onset Date/Surgical Date 12/10/19   per pt.  report of 6 month history of symptoms   Hand Dominance Right      Precautions   Precautions None      Restrictions   Weight Bearing Restrictions No      Balance Screen   Has the patient fallen in the past 6 months Yes    How many times? 2      [place] residence    Living Arrangements Alone    Type of [place] to enter    Entrance Stairs-Number of Steps [place] --   1 story home with basement   [place] - single point;Shower seat      Prior Function   Level of Independence Independent with community mobility with device   reports SPC use since late April 2020-see gait below     Cognition   Overall Cognitive Status Within Functional Limits for tasks assessed      Observation/Other Assessments   Focus on Therapeutic Outcomes (FOTO)  --   not assessed     Posture/Postural Control   Posture/Postural Control Postural limitations    Postural Limitations Rounded Shoulders;Increased thoracic kyphosis      ROM / Strength   AROM / PROM / Strength AROM;Strength      AROM   Overall AROM Comments Bilateral shoulder flexion AROM to 100 deg and ER reach limited to suboccipital region otherwise bilat. UE/LE AROM grossly [facility]      Strength   Overall Strength Comments Right grip 37 lbs., left grip 50 lbs.    Strength Assessment Site Shoulder;Elbow;Hip;Knee;Ankle    Right/Left Shoulder Right;Left    Right Shoulder Flexion 4+/5    Right Shoulder ABduction 4/5    Right Shoulder Internal Rotation 5/5    Right Shoulder External Rotation 5/5    Left Shoulder Flexion 4+/5    Left Shoulder ABduction 4/5    Left Shoulder Internal Rotation 5/5    Left Shoulder External Rotation 5/5    Right/Left Elbow Right;Left    Right Elbow Flexion 4+/5    Right Elbow Extension 5/5    Left Elbow Flexion 5/5    Left Elbow Extension 5/5    Right/Left Hip Right;Left    Right Hip Flexion 4/5      Right Hip External Rotation  5/5    Right Hip Internal Rotation 5/5    Left Hip Flexion 4/5    Left Hip External Rotation 4+/5    Left Hip Internal Rotation 5/5    Right/Left Knee Right;Left    Right Knee Flexion 5/5    Right Knee Extension 4+/5    Left Knee Flexion 5/5    Left Knee Extension 4+/5    Right/Left Ankle Right;Left    Right Ankle Dorsiflexion 5/5    Right Ankle Inversion  5/5    Right Ankle Eversion 5/5    Left Ankle Dorsiflexion 5/5    Left Ankle Inversion 5/5    Left Ankle Eversion 5/5      Ambulation/Gait   Gait Comments Pt. ambulates mod I with SPC-he reports use of cane since late April and was previously independent with ambulation without AD prior to this, decreased cadence more pronounced when fatigued otherwise no overt gait deviations noted            [facility] Pre-Surgical Assessment - 06/09/20 0001    5 Meter Walk Test- trial 1 9 sec    5 Meter Walk Test- trial 2 9 sec.     5 Meter Walk Test- trial 3 10 sec.    5 meter walk test average 9.33 sec    4 Stage Balance Test tolerated for:  5 sec.    4 Stage Balance Test Position 3    Sit To Stand Test- trial 1 31 sec.    Comment 31 seconds    ADL/IADL Independent with: Bathing;Dressing;Meal prep    ADL/IADL Needs Assistance with: Finances;Yard work    6 Minute Walk- Baseline yes    BP (mmHg) 90/60    HR (bpm) 66    02 Sat (%RA) 98 %    Modified Borg Scale for Dyspnea 1- Very mild shortness of breath    Perceived Rate of Exertion (Borg) 7- Very, very light    6 Minute Walk Post Test yes    BP (mmHg) 110/64    HR (bpm) 83    02 Sat (%RA) 98 %    Modified Borg Scale for Dyspnea 5- Strong or hard breathing    Perceived Rate of Exertion (Borg) 17- Very hard    Aerobic Endurance Distance Walked 645    Endurance additional comments Pt. ambulated with SPC, distance as noted with no rest breaks                    Objective measurements completed on examination: See above findings.                PT Education - 06/09/20 1140    Education Details POC    Person(s) Educated Patient;Child(ren)    Methods Explanation    Comprehension Verbalized understanding                       Plan - 06/09/20 1141    Clinical Impression Statement See assessment in note    Personal Factors and Comorbidities Age;Comorbidity 3+    Comorbidities see PMH    Examination-Activity Limitations Locomotion Level;Stand;Transfers    Examination-Participation Restrictions Community Activity    Stability/Clinical Decision Making Stable/Uncomplicated    Clinical Decision Making Low    Rehab Potential Good    PT Frequency One time visit    PT Duration --   NA   PT Treatment/Interventions --   1 time visit for pre-TAVR assessment   PT Next Visit Plan NA    PT Home Exercise Plan NA    Consulted and Agree with Plan of Care Patient           Patient demonstrated the following deficits and impairments:  Difficulty walking, Decreased activity tolerance, Decreased strength, Postural dysfunction, Decreased range of motion, Decreased endurance, Cardiopulmonary status limiting activity  Visit Diagnosis: Other abnormalities of gait and mobility        Clinical Impression Statement: Pt is a 86 y/o male presenting to OP PT  for evaluation prior to possible TAVR surgery due to severe aortic stenosis. Pt reports onset of shortness of breath approximately 6 months ago. Symptoms are limiting his walking and activity tolerance. Pt presents with decreased shoulder ROM and decreased UE/LE strength, decreased balance and is at high fall risk 4 stage balance test, decreased walking speed and impaired aerobic endurance per 6 minute walk test. Pt ambulated a total of 645 feet in 6 minute walk without rest breaks using SPC to ambulate. Shortness of breath and RPE/fatigue level increased significantly with 6 minute walk test. Based on the Short Physical Performance Battery, patient has a  frailty rating of 7/12 with </= 5/12 considered frail and with a disability rating of 52.85%.           Problem List Patient Active Problem List   Diagnosis Date Noted  . Pancreatic mass 02/02/2020  . Prostate cancer ([place]) 02/02/2020  . Generalized weakness 12/13/2019  . Chills without fever 12/11/2019  . Rectal pain 12/11/2019  . Thrombocytopenia ([place]) 12/11/2019  . Cerebral embolism with cerebral infarction 05/20/2019  . CHF (congestive heart failure) ([place]) 05/17/2019  . Palliative care by specialist   . Goals of care, counseling/discussion   . Acute on chronic systolic congestive heart failure ([place])   . Grief reaction 05/15/2019  . Chest pain 12/26/2018  . Ascending aortic aneurysm ([place]) 02/09/2018  . CKD (chronic kidney disease) stage 3, GFR 30-59 ml/min 02/03/2017  . Status post cardiac catheterization 07/14/2016  . Shortness of breath 05/13/2016  . 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 Chronic anemia 09/14/2015  . Lumbosacral radiculopathy at S1 08/22/2015  . DDD (degenerative disc disease), lumbar 08/06/2015  . Basal cell carcinoma, ear 03/31/2015  . History of nonmelanoma skin cancer 11/18/2014  . Aortic stenosis 07/03/2014  . Aortic valve stenosis 07/03/2014  . Bradycardia 07/03/2014  . H/O cardiac catheterization 07/03/2014  . HTN (hypertension) 07/03/2014  . Hyperlipidemia 07/03/2014  . Hypertension 07/03/2014  . ST elevation myocardial infarction involving right coronary artery ([place]) 07/03/2014  . Pacemaker 07/03/2014  . Shoulder pain 11/20/2013  . Pain in joint involving pelvic region and thigh 11/27/2012  . Trochanteric bursitis 11/27/2012    Beaulah Dinning, PT, DPT 06/09/20 11:48 AM  [facility] [address] [place], [place], [address] Phone: [phone]   Fax:  [phone]  Name: Kristopher Fernandez MRN: [id number] Date of Birth: 1932/08/21

## 2020-06-09 NOTE — Progress Notes (Signed)
[facility]  CARDIOTHORACIC SURGERY CONSULTATION REPORT  Referring Provider is Larey Dresser, MD PCP is Tracie Harrier, MD  Chief Complaint  Patient presents with  . Aortic Stenosis    Surgical eval for TAVR/ review all testing/studies    HPI:  Patient is an 86 year old male with history of coronary artery disease status post acute myocardial infarction 2006 with ischemic cardiomyopathy and chronic systolic congestive heart failure, aortic stenosis, sick sinus syndrome status post permanent pacemaker for symptomatic bradycardia in 2012, and questionable history of cerebellar stroke on long-term anticoagulation using Eliquis who has been referred for surgical consultation to discuss treatment options for management of severe symptomatic aortic stenosis.  Patient's cardiac history dates back to 2006 when he suffered an acute myocardial infarction.  He was treated with multivessel stenting.  He had been followed by Dr. Saralyn Pilar in [place] for some time and underwent permanent pacemaker placement for symptomatic bradycardia in 2012.  In May 2020 the patient was hospitalized with acute shortness of breath, dizziness, atypical chest pain.  Troponin was minimally elevated.  Catheterization revealed stable coronary artery disease with chronic occlusion of the mid left circumflex coronary artery and 85% ostial stenosis of the posterior descending coronary artery.  Echocardiogram revealed what was felt to be moderate aortic stenosis and severe left ventricular systolic dysfunction with ejection fraction estimated only 20 to 25%.  Symptoms of dizziness and questionable cerebellar signs prompted head CT which was negative for stroke.  MRI was not performed because of the pacemaker.  The patient was transferred to [facility] where he was seen by neurology who thought symptoms might be related to cerebellar stroke and recommended  anticoagulation using Eliquis.  The patient's acute heart failure was managed by Dr. Aundra Dubin who has been following him carefully ever since.  Echocardiogram performed October 2020 revealed severe left ventricular systolic dysfunction with ejection fraction estimated 20 to 25%.  The aortic valve appeared trileaflet with restricted leaflet mobility.  Peak velocity across aortic valve measured 2.5 m/s corresponding to mean transvalvular gradient estimated at 14 mmHg.  The DVI was notably quite Fernandez at 0.21 with aortic valve area calculated only 0.78 cm by VTI.  Dobutamine stress echocardiogram was performed January 21, 2020.  Ejection fraction was estimated 25 to 30%.  Resting gradient across the aortic valve measured 13 mm at baseline and increased to 34 mm at peak stress with aortic valve area calculated 1.1 cm.  TEE confirmed the presence of severe Fernandez-flow Fernandez gradient aortic stenosis with mean transvalvular gradient 27 mmHg, DVI 0.24, and stroke-volume index only 21 with aortic valve area calculated 0.56 cm by VTI.  Diagnostic cardiac catheterization revealed stable coronary artery anatomy with known chronic occlusion of left circumflex coronary artery and 80% proximal stenosis of posterior lateral branch off the distal right coronary artery.  There was moderate nonobstructive disease in the left anterior descending coronary artery territory and mild pulmonary hypertension.  Cardiac output was preserved.  The patient was referred to the multidisciplinary heart valve clinic and has been evaluated previously by Dr. Angelena Form.  CT angiography was performed and the patient referred for surgical consultation.  Patient is a widower and lives alone [place] the country in [place].  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  Although theoretically he has been retired, he still remains active and works on his farm.  He complains that over the past year or 2 he has experienced steady decline  in his exercise tolerance with  worsening fatigue and severe exertional shortness of breath.  Symptoms have progressed substantially since he underwent radiation therapy recently for prostate cancer.  He states that he now gets short of breath with moderate and sometimes Fernandez-level activity and he frequently has to take breaks.  He denies any history of resting shortness of breath, orthopnea, or lower extremity edema.  He reports occasional mild tightness with episodes of exertional shortness of breath.  He has not recently had any dizzy spells.  Appetite is marginal.  He has not lost any weight.  His gait has become slightly unstable and he now uses a cane for stability.  He has had several mechanical falls from losing his balance.    Past Medical History:  Diagnosis Date  . Anginal pain ([place])   . Aortic stenosis   . Basal cell carcinoma, ear 03/31/2015  . Bradycardia 07/03/2014  . CHF (congestive heart failure) ([place])   . DDD (degenerative disc disease), lumbar 08/06/2015  . H/O cardiac catheterization 07/03/2014   Overview:  Cypher stent proximal and distal RCA 07/18/05 and TAXUS stent mid LAD 07/20/05 at [facility]  . HTN (hypertension) 07/03/2014  . Hyperlipidemia 07/03/2014  . Lumbosacral radiculopathy at S1 08/22/2015  . MI (myocardial infarction) ([place]) 07/03/2014   Overview:  Mi 07/17/05  . Myocardial infarction ([place])   . Normocytic normochromic anemia 09/14/2015  . Pacemaker 07/03/2014   Overview:  Dual chamber pacemaker 08/11/11  . Pancreatic mass    not hypermetabolic on PET-CT imaging  . Prostate cancer ([place])    metastatic  . Shortness of breath dyspnea   . SOB (shortness of breath) on exertion 05/13/2016    Past Surgical History:  Procedure Laterality Date  . CARDIAC CATHETERIZATION Bilateral 07/14/2016   Procedure: Right/Left Heart Cath and Coronary Angiography;  Surgeon: Isaias Cowman, MD;  Location: [facility];  Service: Cardiovascular;  Laterality: Bilateral;  . CORONARY ANGIOPLASTY    . HERNIA  REPAIR    . LEFT HEART CATH AND CORONARY ANGIOGRAPHY N/A 05/09/2019   Procedure: LEFT HEART CATH AND CORONARY ANGIOGRAPHY;  Surgeon: Corey Skains, MD;  Location: [facility];  Service: Cardiovascular;  Laterality: N/A;  . PROSTATE BIOPSY N/A 12/11/2019   Procedure: PROSTATE BIOPSY;  Surgeon: Abbie Sons, MD;  Location: [facility] ORS;  Service: Urology;  Laterality: N/A;  . RIGHT HEART CATH N/A 05/17/2019   Procedure: RIGHT HEART CATH;  Surgeon: Larey Dresser, MD;  Location: [facility];  Service: Cardiovascular;  Laterality: N/A;  . RIGHT HEART CATH AND CORONARY ANGIOGRAPHY N/A 05/22/2020   Procedure: RIGHT HEART CATH AND CORONARY ANGIOGRAPHY;  Surgeon: Larey Dresser, MD;  Location: [facility];  Service: Cardiovascular;  Laterality: N/A;  . TEE WITHOUT CARDIOVERSION N/A 05/14/2020   Procedure: TRANSESOPHAGEAL ECHOCARDIOGRAM (TEE);  Surgeon: Larey Dresser, MD;  Location: [facility];  Service: Cardiovascular;  Laterality: N/A;  . TRANSRECTAL ULTRASOUND N/A 12/11/2019   Procedure: TRANSRECTAL ULTRASOUND;  Surgeon: Abbie Sons, MD;  Location: [facility] ORS;  Service: Urology;  Laterality: N/A;    Family History  Problem Relation Age of Onset  . Heart attack Father   . Bladder Cancer Neg Hx   . Kidney cancer Neg Hx   . Prostate cancer Neg Hx     Social History   Socioeconomic History  . Marital status: Widowed    Spouse name: Not on file  . Number of children: 1  . Years of  education: Not on file  . Highest education level: Not on file  Occupational History  . Occupation: retired-farmer  Tobacco Use  . Smoking status: Former Smoker    Years: 10.00    Types: Cigarettes    Quit date: 1972    Years since quitting: 49.5  . Smokeless tobacco: Former Systems developer    Types: Secondary school teacher  . Vaping Use: Never used  Substance and Sexual Activity  . Alcohol use: No  . Drug use: No  . Sexual activity: Not on file  Other Topics Concern  . Not on file    Social History Narrative  . Not on file   Social Determinants of Health   Financial Resource Strain:   . Difficulty of Paying Living Expenses:   Food Insecurity:   . Worried About Charity fundraiser in the Last Year:   . Arboriculturist in the Last Year:   Transportation Needs:   . Film/video editor (Medical):   Marland Kitchen Lack of Transportation (Non-Medical):   Physical Activity:   . Days of Exercise per Week:   . Minutes of Exercise per Session:   Stress:   . Feeling of Stress :   Social Connections:   . Frequency of Communication with Friends and Family:   . Frequency of Social Gatherings with Friends and Family:   . Attends Religious Services:   . Active Member of Clubs or Organizations:   . Attends Archivist Meetings:   Marland Kitchen Marital Status:   Intimate Partner Violence:   . Fear of Current or Ex-Partner:   . Emotionally Abused:   Marland Kitchen Physically Abused:   . Sexually Abused:     Current Outpatient Medications  Medication Sig Dispense Refill  . acetaminophen (TYLENOL) 500 MG tablet Take 1,000 mg by mouth every 6 (six) hours as needed for mild pain or moderate pain.     Marland Kitchen albuterol (VENTOLIN HFA) 108 (90 Base) MCG/ACT inhaler Inhale 1-2 puffs into the lungs every 6 (six) hours as needed for wheezing or shortness of breath.    Marland Kitchen apixaban (ELIQUIS) 5 MG TABS tablet Take 1 tablet (5 mg total) by mouth 2 (two) times daily. 60 tablet 6  . atorvastatin (LIPITOR) 80 MG tablet Take 80 mg by mouth daily at 6 PM.     . carvedilol (COREG) 6.25 MG tablet Take 1 tablet (6.25 mg total) by mouth 2 (two) times daily with a meal. 60 tablet 5  . furosemide (LASIX) 20 MG tablet Take 1 tablet (20 mg total) by mouth as needed (weight. Take one tablet if weight increases by 3 pounds overnight or 5 pounds in one week.). (Patient taking differently: Take 20 mg by mouth daily as needed for fluid (weight. Take one tablet if weight increases by 3 pounds overnight or 5 pounds in one week.). ) 30  tablet 0  . ipratropium-albuterol (DUONEB) 0.5-2.5 (3) MG/3ML SOLN Take 3 mLs by nebulization every 6 (six) hours as needed. (Patient taking differently: Take 3 mLs by nebulization every 6 (six) hours as needed (wheezing/shortness of breath.). ) 360 mL 1  . ranolazine (RANEXA) 500 MG 12 hr tablet Take 500 mg by mouth 2 (two) times daily.    Marland Kitchen spironolactone (ALDACTONE) 25 MG tablet Take 0.5 tablets (12.5 mg total) by mouth at bedtime. (Patient taking differently: Take 12.5 mg by mouth daily after breakfast. ) 45 tablet 3  . tamsulosin (FLOMAX) 0.4 MG CAPS capsule Take 1 capsule (0.4 mg total)  by mouth daily after supper. 30 capsule 6  . venlafaxine XR (EFFEXOR-XR) 75 MG 24 hr capsule Take 1 capsule (75 mg total) by mouth daily with breakfast. 30 capsule 0   No current facility-administered medications for this visit.    No Known Allergies    Review of Systems:   General:  decreased appetite, decreased energy, no weight gain, no weight loss, no fever  Cardiac:  + chest pain with exertion, no chest pain at rest, +SOB with exertion, no resting SOB, no PND, no orthopnea, no palpitations, no arrhythmia, no atrial fibrillation, no LE edema, no dizzy spells, no syncope  Respiratory:  + exertional shortness of breath, + home oxygen, no productive cough, no dry cough, no bronchitis, no wheezing, no hemoptysis, no asthma, no pain with inspiration or cough, no sleep apnea, no CPAP at night  GI:   no difficulty swallowing, no reflux, no frequent heartburn, no hiatal hernia, no abdominal pain, no constipation, no diarrhea, no hematochezia, no hematemesis, no melena  GU:   no dysuria,  + frequency, no urinary tract infection, no hematuria, no enlarged prostate, no kidney stones, no kidney disease  Vascular:  + pain suggestive of claudication, no pain in feet, no leg cramps, no varicose veins, no DVT, no non-healing foot ulcer  Neuro:   Questionable h/o stroke, no TIA's, no seizures, no headaches, no  temporary blindness one eye,  no slurred speech, no peripheral neuropathy, no chronic pain, mild instability of gait, no memory/cognitive dysfunction  Musculoskeletal: no arthritis, no joint swelling, no myalgias, mild difficulty walking, somewhat decreased mobility   Skin:   no rash, no itching, no skin infections, no pressure sores or ulcerations  Psych:   no anxiety, no depression, no nervousness, no unusual recent stress  Eyes:   + blurry vision, no floaters, no recent vision changes, no wears glasses or contacts  ENT:   no hearing loss, no loose or painful teeth, partial dentures, last saw dentist < 6 months ago  Hematologic:  no easy bruising, no abnormal bleeding, no clotting disorder, no frequent epistaxis  Endocrine:  no diabetes, does not check CBG's at home           Physical Exam:   Ht 6' (1.829 m)   Wt 185 lb (83.9 kg)   BMI 25.09 kg/m   General:  Elderly,  well-appearing  HEENT:  Unremarkable   Neck:   no JVD, no bruits, no adenopathy   Chest:   clear to auscultation, symmetrical breath sounds, no wheezes, no rhonchi   CV:   RRR, grade II/VI crescendo/decrescendo murmur heard best at LLSB,  no diastolic murmur  Abdomen:  soft, non-tender, no masses   Extremities:  warm, well-perfused, pulses diminished but palpable, no LE edema  Rectal/GU  Deferred  Neuro:   Grossly non-focal and symmetrical throughout  Skin:   Clean and dry, no rashes, no breakdown   Diagnostic Tests:  ECHOCARDIOGRAM REPORT       Patient Name:  Kristopher Fernandez Date of Exam: 10/15/2019  Medical Rec #: [id number]   Height:    72.0 in  Accession #:  [id number]  Weight:    181.0 lb  Date of Birth: 1932/11/11   BSA:     2.04 m  Patient Age:  89 years   BP:      104/42 mmHg  Patient Gender: M       HR:      89 bpm.  Exam Location: [facility]  Procedure: 2D Echo, Cardiac Doppler and Color Doppler   Indications:  I63.00    History:     Patient has prior history of Echocardiogram examinations,  most         recent 05/10/2019. Previous Myocardial Infarction,  Pacemaker,         Stroke; AS Risk Factors:Hypertension, Dyslipidemia and  Former         Smoker.    Sonographer:  Coralyn Helling RDCS  Referring Phys: [id number] PRAMOD S SETHI     Sonographer Comments: Technically difficult study due to poor echo  windows.  IMPRESSIONS    1. Left ventricular ejection fraction, by visual estimation, is 20 to  25%. The left ventricle has severely decreased function. Mildly increased  left ventricular size. There is no left ventricular hypertrophy.  2. Left ventricular diastolic Doppler parameters are consistent with  impaired relaxation pattern of LV diastolic filling.  3. Global right ventricle has normal systolic function.The right  ventricular size is normal.  4. Left atrial size was mildly dilated.  5. Right atrial size was normal.  6. Mild mitral annular calcification.  7. The mitral valve is normal in structure. No evidence of mitral valve  regurgitation. No evidence of mitral stenosis.  8. The tricuspid valve is normal in structure. Tricuspid valve  regurgitation is trivial.  9. The aortic valve has an indeterminant number of cusps Aortic valve  regurgitation is trivial by color flow Doppler. Severe aortic valve  stenosis.  10. The pulmonic valve was normal in structure. Pulmonic valve  regurgitation is trivial by color flow Doppler.  11. Aortic dilatation noted.  12. There is mild dilatation of the ascending aorta measuring 39 mm.  13. A pacer wire is visualized.  14. The inferior vena cava is normal in size with greater than 50%  respiratory variability, suggesting right atrial pressure of 3 mmHg.  15. Severe global reduction in LV systolic function; grade 1 diastolic  dysfunction; mild LVE; mildly dilated ascending aorta; severe AS (AVA .8  cm2; DI .22); trace AI; mild LAE.     FINDINGS  Left Ventricle: Left ventricular ejection fraction, by visual estimation,  is 20 to 25%. The left ventricle has severely decreased function. There is  no left ventricular hypertrophy. Mildly increased left ventricular size.  Spectral Doppler shows Left  ventricular diastolic Doppler parameters are consistent with impaired  relaxation pattern of LV diastolic filling.   Right Ventricle: The right ventricular size is normal. Global RV systolic  function is has normal systolic function. The tricuspid regurgitant  velocity is 2.08 m/s, and with an assumed right atrial pressure of 10  mmHg, the estimated right ventricular  systolic pressure is normal at 27.4 mmHg.   Left Atrium: Left atrial size was mildly dilated.   Right Atrium: Right atrial size was normal in size   Pericardium: There is no evidence of pericardial effusion.   Mitral Valve: The mitral valve is normal in structure. Mild mitral annular  calcification. No evidence of mitral valve stenosis by observation. No  evidence of mitral valve regurgitation.   Tricuspid Valve: The tricuspid valve is normal in structure. Tricuspid  valve regurgitation is trivial by color flow Doppler.   Aortic Valve: The aortic valve has an indeterminant number of cusps.  Aortic valve regurgitation is trivial by color flow Doppler. Aortic  regurgitation PHT measures 490 msec. Severe aortic stenosis is present.  Aortic valve mean gradient measures 14.2  mmHg. Aortic valve peak gradient measures 25.7 mmHg. Aortic valve  area, by  VTI measures 0.78 cm.   Pulmonic Valve: The pulmonic valve was normal in structure. Pulmonic valve  regurgitation is trivial by color flow Doppler.   Aorta: Aortic dilatation noted. There is mild dilatation of the ascending  aorta measuring 39 mm.   Venous: The inferior vena cava is normal in size with greater than 50%  respiratory variability, suggesting right atrial pressure of 3 mmHg.   IAS/Shunts: No  atrial level shunt detected by color flow Doppler.   Additional Comments: A pacer wire is visualized.    LEFT VENTRICLE  PLAX 2D  LVIDd:     5.50 cm  LVIDs:     4.90 cm  LV PW:     1.10 cm  LV IVS:    1.00 cm  LVOT diam:   2.20 cm  LV SV:     35 ml  LV SV Index:  16.90  LVOT Area:   3.80 cm     RIGHT VENTRICLE      IVC  RVSP:      20.4 mmHg IVC diam: 1.50 cm   LEFT ATRIUM       Index    RIGHT ATRIUM      Index  LA diam:    3.50 cm 1.71 cm/m RA Pressure: 3.00 mmHg  LA Vol (A2C):  85.4 ml 41.82 ml/m RA Area:   16.90 cm  LA Vol (A4C):  61.3 ml 30.02 ml/m RA Volume:  47.80 ml 23.41 ml/m  LA Biplane Vol: 72.6 ml 35.55 ml/m  AORTIC VALVE  AV Area (Vmax):  0.76 cm  AV Area (Vmean):  0.75 cm  AV Area (VTI):   0.78 cm  AV Vmax:      253.40 cm/s  AV Vmean:     175.000 cm/s  AV VTI:      0.566 m  AV Peak Grad:   25.7 mmHg  AV Mean Grad:   14.2 mmHg  LVOT Vmax:     50.38 cm/s  LVOT Vmean:    34.640 cm/s  LVOT VTI:     0.117 m  LVOT/AV VTI ratio: 0.21  AI PHT:      490 msec    AORTA  Ao Root diam: 3.50 cm  Ao Asc diam: 3.90 cm   MITRAL VALVE             TRICUSPID VALVE  MV Area (PHT):            TR Peak grad:  17.4 mmHg                    TR Vmax:    209.00 cm/s  MV Decel Time: 408 msec       Estimated RAP: 3.00 mmHg  MV E velocity: 53.76 cm/s 103 cm/s RVSP:      20.4 mmHg  MV A velocity: 117.60 cm/s 70.3 cm/s  MV E/A ratio: 0.46    1.5    SHUNTS                    Systemic VTI: 0.12 m                    Systemic Diam: 2.20 cm     Kirk Ruths MD  Electronically signed by Kirk Ruths MD  Signature Date/Time: 10/15/2019/4:02:14 PM        ECHOCARDIOGRAM REPORT       Patient Name:  Kristopher Fernandez Date of Exam: 01/21/2020  Medical  Rec #: [id number]  Height:    72.0 in  Accession #:  [id number]  Weight:    184.0 lb  Date of Birth: 08/21/1932  BSA:     2.06 m  Patient Age:  32 years   BP:      125/71 mmHg  Patient Gender: M      HR:      65 bpm.  Exam Location: Outpatient   Procedure: Dobutamine Stress   Indications:  Aortic Stenosis    History:    Patient has prior history of Echocardiogram examinations,  most         recent 10/15/2019. CAD and Previous Myocardial Infarction,         Pacemaker, COPD and Stroke; Risk Factors:Hypertension and         Dyslipidemia. Ischemic Cardiomyoapthy. Chronic kidney  disease.    Sonographer:  Darlina Sicilian RDCS  Referring Phys: [place] Comments: Dobutamine stress test for Fernandez flow stenosis  IMPRESSIONS    1. Left ventricular ejection fraction, by visual estimation, is 25 to  30%. The left ventricle has moderate to severely decreased function. There  is no left ventricular hypertrophy.  2. Moderately dilated left ventricular internal cavity size.  3. The left ventricle demonstrates regional wall motion abnormalities.  4. Diffuse hypokinesis with mid and inferior wall akinesis.  5. Global right ventricle has mildly reduced systolic function.The right  ventricular size is mildly enlarged. No increase in right ventricular wall  thickness.  6. Left atrial size was moderately dilated.  7. Right atrial size was normal.  8. Mild mitral annular calcification.  9. The mitral valve is degenerative. Mild mitral valve regurgitation.  10. The tricuspid valve is not well visualized.  11. The tricuspid valve is not well visualized. Tricuspid valve  regurgitation Not interogated.  12. The aortic valve is tricuspid. Aortic valve regurgitation is trivial.  Moderate to severe aortic valve stenosis.  13. Dobutamine echo done from 5-20 ug/kg/min Resting gradient was mean  13  mmHg peak 25 mmHg with DVI 0.31 and AVA 1.1 cm2 Highest gradients had high  dose dobutamine showed peak velocity of 4.2 m/sec mean gradient 34 mmHg  peak 72 mmHg with DVI 0.28 and  AVA of 1 cm2 Overall findings consistent with moderate to severe aortic  stenosis.  14. Pulmonic regurgitation is mild.  15. The pulmonic valve was not well visualized. Pulmonic valve  regurgitation is mild.  16. The aortic root was not well visualized.  17. The interatrial septum was not assessed.   FINDINGS  Left Ventricle: Left ventricular ejection fraction, by visual estimation,  is 25 to 30%. The left ventricle has moderate to severely decreased  function. The left ventricle demonstrates regional wall motion  abnormalities. The left ventricular internal  cavity size was moderately dilated left ventricle. There is no left  ventricular hypertrophy. Diffuse hypokinesis with mid and inferior wall  akinesis.   Right Ventricle: The right ventricular size is mildly enlarged. No  increase in right ventricular wall thickness. Global RV systolic function  is has mildly reduced systolic function.   Left Atrium: Left atrial size was moderately dilated.   Right Atrium: Right atrial size was normal in size   Pericardium: There is no evidence of pericardial effusion.   Mitral Valve: The mitral valve is degenerative in appearance. There is  mild thickening of the mitral valve leaflet(s). There is mild  calcification of the mitral valve leaflet(s). Mild mitral annular  calcification. Mild mitral valve regurgitation.   Tricuspid Valve: The tricuspid valve is not well visualized. Tricuspid  valve regurgitation Not interogated.   Aortic Valve: The aortic valve is tricuspid. Aortic valve regurgitation is  trivial. Moderate to severe aortic stenosis is present. Aortic valve mean  gradient measures 20.0 mmHg. Aortic valve peak gradient measures 39.7  mmHg. Aortic valve area, by VTI  measures 1.03 cm.  Dobutamine echo done from 5-20 ug/kg/min Resting  gradient was mean 13 mmHg peak 25 mmHg with DVI 0.31 and AVA 1.1 cm2  Highest gradients had high dose dobutamine showed peak velocity of 4.2  m/sec mean gradient 34 mmHg peak 72 mmHg with  DVI 0.28 and AVA of 1 cm2 Overall findings consistent with moderate to  severe aortic stenosis.   Pulmonic Valve: The pulmonic valve was not well visualized. Pulmonic valve  regurgitation is mild. Pulmonic regurgitation is mild.   Aorta: The aortic root was not well visualized.   IAS/Shunts: The interatrial septum was not assessed.     LEFT VENTRICLE  PLAX 2D  LVOT diam:   2.15 cm  LVOT Area:   3.63 cm     AORTIC VALVE  AV Area (Vmax):  0.95 cm  AV Area (Vmean):  0.97 cm  AV Area (VTI):   1.03 cm  AV Vmax:      315.00 cm/s  AV Vmean:     210.000 cm/s  AV VTI:      0.633 m  AV Peak Grad:   39.7 mmHg  AV Mean Grad:   20.0 mmHg  LVOT Vmax:     82.60 cm/s  LVOT Vmean:    56.300 cm/s  LVOT VTI:     0.179 m  LVOT/AV VTI ratio: 0.28     SHUNTS  Systemic VTI: 0.18 m  Systemic Diam: 2.15 cm     Jenkins Rouge MD  Electronically signed by Jenkins Rouge MD  Signature Date/Time: 01/21/2020/1:39:54 PM       TRANSESOPHOGEAL ECHO REPORT       Patient Name:  Kristopher Fernandez Date of Exam: 05/14/2020  Medical Rec #: [id number]  Height:    72.0 in  Accession #:  [id number]  Weight:    186.6 lb  Date of Birth: [birth year] 1932  BSA:     2.069 m  Patient Age:  28 years   BP:      120/80 mmHg  Patient Gender: M      HR:      70 bpm.  Exam Location: Inpatient   Procedure: Transesophageal Echo   Indications:  Aortic stenosis    History:    Patient has prior history of Echocardiogram examinations.    Sonographer:  Vikki Ports Turrentine  Referring Phys: Sheridan: The transesophogeal probe was passed without difficulty through    the esophogus of the patient. Sedation performed by different physician.  The patient developed no complications during the procedure.   IMPRESSIONS    1. Left ventricular ejection fraction, by estimation, is 20 to 25%. The  left ventricle has severely decreased function. The left ventricle  demonstrates global hypokinesis. The left ventricular internal cavity size  was moderately dilated.  2. Right ventricular systolic function is mildly reduced. The right  ventricular size is mildly enlarged.  3. Left atrial size was moderately dilated. No left atrial/left atrial  appendage thrombus was detected.  4. Right atrial size was mildly dilated. No ASD or PFO by color doppler.  5. The mitral  valve is normal in structure. Trivial mitral valve  regurgitation. No evidence of mitral stenosis.  6. The aortic valve is tricuspid. Aortic valve regurgitation is trivial.  Severe aortic valve stenosis. Aortic valve area, by VTI measures 0.56 cm.  Aortic valve mean gradient measures 27.0 mmHg. Dimensionless index 0.24.  7. Grade 3 plaque in the descending thoracic aorta and arch.   FINDINGS  Left Ventricle: Left ventricular ejection fraction, by estimation, is 20  to 25%. The left ventricle has severely decreased function. The left  ventricle demonstrates global hypokinesis. The left ventricular internal  cavity size was moderately dilated.  There is no left ventricular hypertrophy.   Right Ventricle: The right ventricular size is mildly enlarged. No  increase in right ventricular wall thickness. Right ventricular systolic  function is mildly reduced.   Left Atrium: Left atrial size was moderately dilated. No left atrial/left  atrial appendage thrombus was detected.   Right Atrium: Right atrial size was mildly dilated.   Pericardium: There is no evidence of pericardial effusion.   Mitral Valve: The mitral valve is normal in structure. Mild mitral annular  calcification. Trivial  mitral valve regurgitation. No evidence of mitral  valve stenosis.   Tricuspid Valve: The tricuspid valve is normal in structure. Tricuspid  valve regurgitation is mild.   Aortic Valve: The aortic valve is tricuspid. Aortic valve regurgitation is  trivial. Severe aortic stenosis is present. Aortic valve mean gradient  measures 27.0 mmHg. Aortic valve peak gradient measures 40.7 mmHg. Aortic  valve area, by VTI measures 0.56  cm.   Pulmonic Valve: The pulmonic valve was normal in structure. Pulmonic valve  regurgitation is trivial.   Aorta: Grade 3 plaque in the descending thoracic aorta and arch. The  aortic root is normal in size and structure.   IAS/Shunts: No atrial level shunt detected by color flow Doppler.   Additional Comments: A pacer wire is visualized in the right ventricle.     LEFT VENTRICLE  PLAX 2D  LVIDd:     7.20 cm  LVOT diam:   1.90 cm  LV SV:     43  LV SV Index:  21  LVOT Area:   2.84 cm     AORTIC VALVE  AV Area (Vmax):  0.69 cm  AV Area (Vmean):  0.58 cm  AV Area (VTI):   0.56 cm  AV Vmax:      319.00 cm/s  AV Vmean:     252.000 cm/s  AV VTI:      0.771 m  AV Peak Grad:   40.7 mmHg  AV Mean Grad:   27.0 mmHg  LVOT Vmax:     77.20 cm/s  LVOT Vmean:    51.200 cm/s  LVOT VTI:     0.151 m  LVOT/AV VTI ratio: 0.20   TRICUSPID VALVE  TR Peak grad:  27.2 mmHg  TR Vmax:    261.00 cm/s    SHUNTS  Systemic VTI: 0.15 m  Systemic Diam: 1.90 cm   Loralie Champagne MD  Electronically signed by Loralie Champagne MD  Signature Date/Time: 05/14/2020/10:48:39 AM       RIGHT HEART CATH AND CORONARY ANGIOGRAPHY  Conclusion  1. Preserved cardiac output.  2. Fernandez filling pressures.  3. Mild pulmonary hypertension.  4. 80% stenosis mid PLV branch off RCA (known from prior cath).  5. Known occlusion of LCx after OM1.  6. Moderate disease in LAD, 50% proximal and 50-60% distal.   7. Known  severe AS from  echo and TEE, valve not crossed.   Referred for TAVR evaluation.  Surgeon Notes    05/14/2020 10:27 AM CV Procedure signed by Larey Dresser, MD  Procedural Details  Technical Details Procedure: Right Heart Cath, Selective Coronary Angiography  Indication: Aortic stenosis.    Procedural Details: The right brachial and radial areas were prepped, draped, and anesthetized with 1% lidocaine. There was a pre-existing peripheral IV that was replaced with a 90F venous sheath. A Swan-Ganz catheter was used for the right heart catheterization. Standard protocol was followed for recording of right heart pressures and sampling of oxygen saturations. Fick cardiac output was calculated. The right radial artery was entered using modified Seldinger technique and a 82F sheath was placed. Standard Judkins catheters were used for selective coronary angiography. There were no immediate procedural complications. The patient was transferred to the post catheterization recovery area for further monitoring.   Estimated blood loss <50 mL.   During this procedure medications were administered to achieve and maintain moderate conscious sedation while the patient's heart rate, blood pressure, and oxygen saturation were continuously monitored and I was present face-to-face 100% of this time.  Medications (Filter: Administrations occurring from 0820 to 1010 on 05/22/20) (important) Continuous medications are totaled by the amount administered until 05/22/20 1010.  Heparin (Porcine) in NaCl 1000-0.9 UT/500ML-% SOLN (mL) Total volume:  1,000 mL Date/Time  Rate/Dose/Volume Action  05/22/20 0840  500 mL Given  0840  500 mL Given    fentaNYL (SUBLIMAZE) injection (mcg) Total dose:  25 mcg Date/Time  Rate/Dose/Volume Action  05/22/20 0854  25 mcg Given    midazolam (VERSED) injection (mg) Total dose:  1 mg Date/Time  Rate/Dose/Volume Action  05/22/20 0855  1 mg Given    lidocaine (PF) (XYLOCAINE)  1 % injection (mL) Total volume:  4 mL Date/Time  Rate/Dose/Volume Action  05/22/20 0923  2 mL Given  0937  2 mL Given    Radial Cocktail/Verapamil only (mL) Total volume:  10 mL Date/Time  Rate/Dose/Volume Action  05/22/20 0939  10 mL Given    heparin sodium (porcine) injection (Units) Total dose:  4,000 Units Date/Time  Rate/Dose/Volume Action  05/22/20 0940  4,000 Units Given    iohexol (OMNIPAQUE) 350 MG/ML injection (mL) Total volume:  90 mL Date/Time  Rate/Dose/Volume Action  05/22/20 1000  90 mL Given    Sedation Time  Sedation Time Physician-1: 1 hour 2 minutes 49 seconds  Radiation/Fluoro  Fluoro time: 19.6 (min) DAP: 77412 (mGycm2) Cumulative Air Kerma: 333 (mGy)  Coronary Findings  Diagnostic Dominance: Right Left Main  30% distal left main stenosis.  Left Anterior Descending  50% proximal LAD stenosis at D1. Moderate D1 with 80% ostial stenosis. 50-60% distal LAD stenosis.  Left Circumflex  Totally occluded after OM1 (chronic).  Right Coronary Artery  40-50% ostial RCA stenosis. Patent proximal RCA stent. Diffuse 30-40% disease mid-distal RCA. 90% stenosis mid-PLV.  Intervention  No interventions have been documented. Right Heart  Right Heart Pressures RHC Procedural Findings: Hemodynamics (mmHg) RA mean 4 RV 47/4 PA 43/9, mean 21 PCWP mean 9 AO 126/52  Oxygen saturations: PA 62% AO 99%  Cardiac Output (Fick) 4.8  Cardiac Index (Fick) 2.34 PVR 2.5 WU  Implants   No implant documentation for this case.  Syngo Images  Show images for CARDIAC CATHETERIZATION Images on Long Term Storage  Show images for Loki, [place] to Procedure Log  Procedure Log    Hemo Data   Most Recent Value  Fick Cardiac Output 4.8 L/min  Fick Cardiac Output Index 2.34 (L/min)/BSA  RA A Wave 6 mmHg  RA V Wave 3 mmHg  RA Mean 4 mmHg  RV Systolic Pressure 47 mmHg  RV Diastolic Pressure 0 mmHg  RV EDP 4 mmHg  PA Systolic Pressure 43 mmHg  PA  Diastolic Pressure 9 mmHg  PA Mean 21 mmHg  PW A Wave 10 mmHg  PW V Wave 13 mmHg  PW Mean 9 mmHg  AO Systolic Pressure 115 mmHg  AO Diastolic Pressure 52 mmHg  AO Mean 80 mmHg  QP/QS 1  TPVR Index 8.97 HRUI  TSVR Index 34.19 HRUI  PVR SVR Ratio 0.15  TPVR/TSVR Ratio 0.26      Cardiac TAVR CT  TECHNIQUE: The patient was scanned on a Siemens Force 726 slice scanner. A 120 kV retrospective scan was triggered in the descending thoracic aorta at 111 HU's. Gantry rotation speed was 270 msecs and collimation was .9 mm. No beta blockade or nitro were given. The 3D data set was reconstructed in 5% intervals of the R-R cycle. Systolic and diastolic phases were analyzed on a dedicated work station using MPR, MIP and VRT modes. The patient received 179mL OMNIPAQUE IOHEXOL 350 MG/ML SOLN of contrast.  FINDINGS: Significant motion artifact impacts diagnostic accuracy of this exam.  Aortic Valve: Tricuspid aortic valve. Severely reduced cusp separation. Moderately thickened, severely calcified aortic valve cusps.  AV calcium score: 2742. Quantitation impacted by metallic artifact from device leads and coronary stent.  To mitigate effects of motion artifact, measurements made at 40% R-R interval.  Virtual Basal Annulus Measurements:  Maximum/Minimum Diameter: 30 x 23.2 mm  Perimeter: 82.8 mm  Area: 523 mm2  No significant LVOT calcifications.  Based on these measurements, the annulus would be suitable for a 26 mm Sapien 3 valve.  Sinus of Valsalva Measurements:  Non-coronary:  34 mm  Right - coronary:  33 mm  Left - coronary:  34 mm  Sinus of Valsalva Height:  Left: 21 mm  Right: 23.6 mm  Aorta: Severe mixed atherosclerotic plaque in the entire thoracic aorta, most prominently in the ascending aorta.  Sinotubular Junction:  30 mm  Ascending Thoracic Aorta: 37 mm at the mid level, 39 mm at the distal ascending aorta.  Aortic Arch:  33  mm  Descending Thoracic Aorta:  35 mm  Coronary Artery Height above Annulus:  Left Main: 15 mm  Right Coronary: 16.5 mm to the orifice of the ostial RCA stent which protrudes into the aorta.  Coronary Arteries: 3 vessel coronary artery disease with coronary stent in RCA, crossing the ostium into the aorta.  Optimum Fluoroscopic Angle for Delivery: LAO 24, CAU 22  Dual chamber permanent pacemaker with left chest wall generator.  IMPRESSION: Significant motion artifact at the annulus may impact diagnostic accuracy of measurements for TAVR.  1. Tricuspid aortic valve. Severely reduced cusp separation. Moderately thickened, severely calcified aortic valve cusps.  2. AV calcium score: 2742.  3. Annulus area 523 mm, suitable for a 26 mm Sapien 3 valve.  4. Adequate coronary artery heights from annulus, however RCA stent crosses ostium and protrudes into aorta.  5. No left atrial appendage thrombus.  6. Optimum Fluoroscopic Angle for Delivery: LAO 24, CAU 22  7. Severe mixed atherosclerotic plaque in the thoracic aorta.   Electronically Signed   By: Cherlynn Kaiser   On: 06/01/2020 21:44    CT ANGIOGRAPHY CHEST, ABDOMEN AND PELVIS  TECHNIQUE: Non-contrast CT of the  chest was initially obtained.  Multidetector CT imaging through the chest, abdomen and pelvis was performed using the standard protocol during bolus administration of intravenous contrast. Multiplanar reconstructed images and MIPs were obtained and reviewed to evaluate the vascular anatomy.  CONTRAST:  139mL OMNIPAQUE IOHEXOL 350 MG/ML SOLN  COMPARISON:  PET-CT 02/21/2020. CT the abdomen and pelvis 01/09/2020. Chest CT 12/21/2017. Chest CTA 10/08/2016.  FINDINGS: CTA CHEST FINDINGS  Cardiovascular: Heart size is mildly enlarged. There is no significant pericardial fluid, thickening or pericardial calcification. There is aortic atherosclerosis, as well as atherosclerosis of  the great vessels of the mediastinum and the coronary arteries, including calcified atherosclerotic plaque in the left main, left anterior descending, left circumflex and right coronary arteries. Severe thickening calcification of the aortic valve. Aneurysmal dilatation of the ascending thoracic aorta (4.5 cm in diameter).  Mediastinum/Lymph Nodes: No pathologically enlarged mediastinal or hilar lymph nodes. Esophagus is unremarkable in appearance. No axillary lymphadenopathy.  Lungs/Pleura: No suspicious appearing pulmonary nodules or masses are noted. A few scattered tiny calcified granulomas are incidentally noted. No acute consolidative airspace disease. No pleural effusions.  Musculoskeletal/Soft Tissues: There are no aggressive appearing lytic or blastic lesions noted in the visualized portions of the skeleton.  CTA ABDOMEN AND PELVIS FINDINGS  Hepatobiliary: No suspicious cystic or solid hepatic lesions. No intra or extrahepatic biliary ductal dilatation. Large calcified gallstone measuring 2.1 cm in diameter. Gallbladder is otherwise unremarkable in appearance.  Pancreas: In the head of the pancreas there is a well-defined 2.3 x 1.5 x 1.9 cm intermediate attenuation (40 HU) lesion (axial image 111 of series 18 and coronal image 91 of series 20), similar to the prior examination from 01/09/2020. No other new pancreatic mass. No pancreatic ductal dilatation. No peripancreatic fluid collections or inflammatory changes.  Spleen: Unremarkable.  Adrenals/Urinary Tract: Fernandez-attenuation nonenhancing lesions in both kidneys compatible with simple cysts, largest of which is in the medial aspect of the upper pole the left kidney measuring 4.1 cm in diameter. Other subcentimeter Fernandez-attenuation lesions in both kidneys, too small to characterize, but statistically likely to represent cysts. In addition, in the lower pole of the right kidney there is a 2.7 cm  Fernandez-attenuation lesion which has a small focus of calcification along the superior/posterior wall (axial image 111 of series 18), stable compared to the prior study (Bosniak class 2 cysts). 9 mm calcification in the distal third of the right ureter shortly before the right ureterovesicular junction (axial image 181 of series 18). No hydroureteronephrosis. Urinary bladder is normal in appearance. Bilateral adrenal glands are normal in appearance.  Stomach/Bowel: The appearance of the stomach is normal. No pathologic dilatation of small bowel or colon. Numerous colonic diverticulae are noted, without surrounding inflammatory changes to suggest an acute diverticulitis at this time. Normal appendix.  Vascular/Lymphatic: Aortic atherosclerosis, without evidence of aneurysm or dissection in the abdominal or pelvic vasculature. No lymphadenopathy noted in the abdomen or pelvis.  Reproductive: Prostate gland and seminal vesicles are grossly unremarkable in appearance.  Other: No significant volume of ascites.  No pneumoperitoneum.  Musculoskeletal: There are no aggressive appearing lytic or blastic lesions noted in the visualized portions of the skeleton.  VASCULAR MEASUREMENTS PERTINENT TO TAVR:  AORTA:  Minimal Aortic Diameter-13 x 11 mm  Severity of Aortic Calcification-moderate  RIGHT PELVIS:  Right Common Iliac Artery -  Minimal Diameter-9.8 x 8.7 mm  Tortuosity-mild  Calcification-mild  Right External Iliac Artery -  Minimal Diameter-6.4 x 6.4 mm  Tortuosity-moderate  Calcification-minimal  Right Common  Femoral Artery -  Minimal Diameter-6.2 x 6.6 mm  Tortuosity-mild  Calcification-mild  LEFT PELVIS:  Left Common Iliac Artery -  Minimal Diameter-7.2 x 5.9 mm  Tortuosity-mild  Calcification-mild  Left External Iliac Artery -  Minimal Diameter-6.8 x 6.0 mm  Tortuosity-moderate  Calcification-mild  Left Common  Femoral Artery -  Minimal Diameter-7.4 x 5.0 mm  Tortuosity-mild  Calcification-mild  Review of the MIP images confirms the above findings.  IMPRESSION: 1. Vascular findings and measurements pertinent to potential TAVR procedure, as detailed above. 2. Severe thickening calcification of the aortic valve, compatible with the reported clinical history of severe aortic stenosis. 3. Aortic atherosclerosis, in addition to left main and 3 vessel coronary artery disease. In addition, there is aneurysmal dilatation of the ascending thoracic aorta (4.5 cm in diameter). Ascending thoracic aortic aneurysm. Recommend semi-annual imaging followup by CTA or MRA and referral to cardiothoracic surgery if not already obtained. This recommendation follows 2010 ACCF/AHA/AATS/ACR/ASA/SCA/SCAI/SIR/STS/SVM Guidelines for the Diagnosis and Management of Patients With Thoracic Aortic Disease. Circulation. 2010; 121: M196-Q229. Aortic aneurysm NOS (ICD10-I71.9). 4. Mild cardiomegaly. 5. Cholelithiasis without evidence of acute cholecystitis at this time. 6. Stable Fernandez-attenuation lesion in the head of the pancreas which remains incompletely characterized on today's examination. Given the lack of hypermetabolism on the prior PET-CT, this may represent a benign lesion, but further characterization with nonemergent abdominal MRI with and without IV gadolinium with MRCP is suggested to definitively characterize this lesion. 7. Colonic diverticulosis without evidence of acute diverticulitis at this time. 8. Additional incidental findings, as above.   Electronically Signed   By: Vinnie Langton M.D.   On: 05/30/2020 14:46    EKG: Atrial paced rhythm (05/07/2020)   Impression:  Patient has stage D2 severe symptomatic Fernandez-flow Fernandez gradient aortic stenosis.  He describes a steady decline in exercise tolerance with worsening symptoms of exertional shortness of breath and fatigue consistent with  chronic systolic congestive heart failure, [place] Heart Association functional class III.  Occasionally with symptoms of shortness of breath he also has mild tightness across his chest.  I have personally reviewed the patient's recent echocardiograms, diagnostic cardiac catheterization, and CT angiograms.  Echocardiograms demonstrate classical findings of Fernandez-flow Fernandez gradient aortic stenosis.  The patient has severe global left ventricular systolic dysfunction with ejection fraction estimated between 20 and 30%.  The aortic valve is trileaflet with moderately severe restricted leaflet mobility involving all 3 leaflets.  Although peak velocity across aortic valve remains less than 4 m/s with mean transvalvular gradients estimated less than 20 mmHg at rest, the DVI is quite Fernandez and stroke-volume index measured only 21 with aortic valve area calculated only 0.56 cm on recent TEE.  Diagnostic cardiac catheterization reveals stable multivessel coronary artery disease with chronic occlusion of left circumflex coronary artery and moderate nonobstructive disease in the left anterior descending coronary artery and 80% ostial stenosis of posterolateral branch of the terminal portion of the right coronary artery.    I agree the patient would benefit from aortic valve replacement.  Risks associated with conventional surgery would be at least moderately elevated because of the patient's advanced age and numerous comorbid medical problems.  Cardiac-gated CTA of the heart reveals anatomical characteristics consistent with aortic stenosis suitable for treatment by transcatheter aortic valve replacement without any significant complicating features and CTA of the aorta and iliac vessels demonstrate what appears to be adequate pelvic vascular access to facilitate a transfemoral approach.  Patient already has a permanent pacemaker in place.  Plan:  The patient and his daughter were counseled at length regarding  treatment alternatives for management of severe symptomatic aortic stenosis. Alternative approaches such as conventional aortic valve replacement, transcatheter aortic valve replacement, and continued medical therapy without intervention were compared and contrasted at length.  The risks associated with conventional surgical aortic valve replacement were discussed in detail, as were expectations for post-operative convalescence, and why I would be reluctant to consider this patient a candidate for conventional surgery.  Issues specific to transcatheter aortic valve replacement were discussed including questions about long term valve durability, the potential for paravalvular leak, possible increased risk of need for permanent pacemaker placement, and other technical complications related to the procedure itself.  Long-term prognosis with medical therapy was discussed. This discussion was placed in the context of the patient's own specific clinical presentation and past medical history.  All of their questions have been addressed.  The patient desires to proceed with transcatheter aortic valve replacement as soon as possible.  We tentatively plan to proceed with surgery on June 17, 2020.  The patient has been instructed to stop taking Eliquis at least 5 days prior to surgery.  Following the decision to proceed with transcatheter aortic valve replacement, a discussion has been held regarding what types of management strategies would be attempted intraoperatively in the event of life-threatening complications, including whether or not the patient would be considered a candidate for the use of cardiopulmonary bypass and/or conversion to open sternotomy for attempted surgical intervention.  The patient specifically requests that should a potentially life-threatening complication develop we would attempt emergency median sternotomy and/or other aggressive surgical procedures if the patient had a technical complication  that was felt to be correctable.  The patient has been advised of a variety of complications that might develop including but not limited to risks of death, stroke, paravalvular leak, aortic dissection or other major vascular complications, aortic annulus rupture, device embolization, cardiac rupture or perforation, mitral regurgitation, acute myocardial infarction, arrhythmia, heart block or bradycardia requiring permanent pacemaker placement, congestive heart failure, respiratory failure, renal failure, pneumonia, infection, other late complications related to structural valve deterioration or migration, or other complications that might ultimately cause a temporary or permanent loss of functional independence or other long term morbidity.  The patient provides full informed consent for the procedure as described and all questions were answered.      I spent in excess of 90 minutes during the conduct of this office consultation and >50% of this time involved direct face-to-face encounter with the patient for counseling and/or coordination of their care.      Valentina Gu. Roxy Manns, MD 06/09/2020 9:33 AM

## 2020-06-10 ENCOUNTER — Other Ambulatory Visit: Payer: Self-pay

## 2020-06-10 DIAGNOSIS — I350 Nonrheumatic aortic (valve) stenosis: Secondary | ICD-10-CM

## 2020-06-11 ENCOUNTER — Ambulatory Visit: Payer: Medicare Other | Admitting: Cardiovascular Disease

## 2020-06-12 NOTE — Progress Notes (Signed)
Emailed device clinic requesting device orders for upcoming procedure 06/17/20. Emailed [employer] Rep, Dannial Monarch notifying of upcoming surgery 06/17/20; Jovita Kussmaul, RN and Harden Mo cc'd.

## 2020-06-12 NOTE — Pre-Procedure Instructions (Signed)
MEDICAL [address] [place], [place] - [place] [address] Phone: [phone] Fax: [phone]     Your procedure is scheduled on Tuesday, June 29 from 07:30 AM- 09:28 AM.  Report to [facility] Main Entrance "A" at 05:30 A.M., and check in at the Admitting office.  Call this number if you have problems the morning of surgery:  [phone]  Call [phone] if you have any questions prior to your surgery date Monday-Friday 8am-4pm.    Remember:  Do not eat or drink after midnight the night before your surgery.    *Stop taking Eliquis on 6/24. You will take your last dose on 6/23 (Wednesday).  Continue taking all other medications without change through the day before surgery.  On the morning of surgery do not take any medications. You can use inhalers if needed.           The Morning of Surgery:            Do not wear jewelry.            Do not wear lotions, powders, colognes, or deodorant.            Men may shave face and neck.            Do not bring valuables to the hospital.            [facility] is not responsible for any belongings or valuables.  Do NOT Smoke (Tobacco/Vapping) or drink Alcohol 24 hours prior to your procedure.  If you use a CPAP at night, you may bring all equipment for your overnight stay.   Contacts, glasses, dentures or bridgework may not be worn into surgery.      For patients admitted to the hospital, discharge time will be determined by your treatment team.   Patients discharged the day of surgery will not be allowed to drive home, and someone needs to stay with them for 24 hours.    Special instructions:   [place]- Preparing For Surgery  Before surgery, you can play an important role. Because skin is not sterile, your skin needs to be as free of germs as possible. You can reduce the number of germs on your skin by washing with CHG (chlorahexidine gluconate) Soap before surgery.  CHG is an  antiseptic cleaner which kills germs and bonds with the skin to continue killing germs even after washing.    Oral Hygiene is also important to reduce your risk of infection.  Remember - BRUSH YOUR TEETH THE MORNING OF SURGERY WITH YOUR REGULAR TOOTHPASTE  Please do not use if you have an allergy to CHG or antibacterial soaps. If your skin becomes reddened/irritated stop using the CHG.  Do not shave (including legs and underarms) for at least 48 hours prior to first CHG shower. It is OK to shave your face.  Please follow these instructions carefully.   1. Shower the NIGHT BEFORE SURGERY and the MORNING OF SURGERY with CHG Soap.   2. If you chose to wash your hair, wash your hair first as usual with your normal shampoo.  3. After you shampoo, rinse your hair and body thoroughly to remove the shampoo.  4. Use CHG as you would any other liquid soap. You can apply CHG directly to the skin and wash gently with a scrungie or a clean washcloth.   5. Apply the CHG Soap to your body ONLY FROM THE NECK DOWN.  Do not  use on open wounds or open sores. Avoid contact with your eyes, ears, mouth and genitals (private parts). Wash Face and genitals (private parts)  with your normal soap.   6. Wash thoroughly, paying special attention to the area where your surgery will be performed.  7. Thoroughly rinse your body with warm water from the neck down.  8. DO NOT shower/wash with your normal soap after using and rinsing off the CHG Soap.  9. Pat yourself dry with a CLEAN TOWEL.  10. Wear CLEAN PAJAMAS to bed the night before surgery, wear comfortable clothes the morning of surgery  11. Place CLEAN SHEETS on your bed the night of your first shower and DO NOT SLEEP WITH PETS.   Day of Surgery: Shower with CHG Soap.   Do not apply any deodorants/lotions.  Please wear clean clothes to the hospital/surgery center.   Remember to brush your teeth WITH YOUR REGULAR TOOTHPASTE.   Please read over the  following fact sheets that you were given.

## 2020-06-13 ENCOUNTER — Ambulatory Visit (HOSPITAL_COMMUNITY)
Admission: RE | Admit: 2020-06-13 | Discharge: 2020-06-13 | Disposition: A | Discharge status: Home / Self Care | Payer: Medicare Other | Source: Ambulatory Visit | Attending: Cardiovascular Disease | Admitting: Cardiovascular Disease

## 2020-06-13 ENCOUNTER — Other Ambulatory Visit: Payer: Self-pay

## 2020-06-13 ENCOUNTER — Other Ambulatory Visit (HOSPITAL_COMMUNITY)
Admission: RE | Admit: 2020-06-13 | Discharge: 2020-06-13 | Disposition: A | Discharge status: Home / Self Care | Payer: Medicare Other | Source: Ambulatory Visit | Attending: Cardiovascular Disease | Admitting: Cardiovascular Disease

## 2020-06-13 ENCOUNTER — Encounter (HOSPITAL_COMMUNITY)
Admission: RE | Admit: 2020-06-13 | Discharge: 2020-06-13 | Disposition: A | Discharge status: Home / Self Care | Payer: Medicare Other | Source: Ambulatory Visit | Attending: Cardiovascular Disease | Admitting: Cardiovascular Disease

## 2020-06-13 ENCOUNTER — Encounter (HOSPITAL_COMMUNITY): Payer: Self-pay

## 2020-06-13 DIAGNOSIS — I350 Nonrheumatic aortic (valve) stenosis: Secondary | ICD-10-CM | POA: Diagnosis not present

## 2020-06-13 DIAGNOSIS — Z01818 Encounter for other preprocedural examination: Secondary | ICD-10-CM | POA: Insufficient documentation

## 2020-06-13 DIAGNOSIS — Z20822 Contact with and (suspected) exposure to covid-19: Secondary | ICD-10-CM | POA: Diagnosis not present

## 2020-06-13 HISTORY — DX: Ischemic cardiomyopathy: I25.5

## 2020-06-13 LAB — TYPE AND SCREEN
ABO/RH(D): A POS
Antibody Screen: NEGATIVE

## 2020-06-13 LAB — HEMOGLOBIN A1C
Hgb A1c MFr Bld: 5.6 % (ref 4.8–5.6)
Mean Plasma Glucose: 114.02 mg/dL

## 2020-06-13 LAB — ABO/RH: ABO/RH(D): A POS

## 2020-06-13 LAB — COMPREHENSIVE METABOLIC PANEL
ALT: 15 U/L (ref 0–44)
AST: 18 U/L (ref 15–41)
Albumin: 2.8 g/dL — ABNORMAL LOW (ref 3.5–5.0)
Alkaline Phosphatase: 80 U/L (ref 38–126)
Anion gap: 9 (ref 5–15)
BUN: 19 mg/dL (ref 8–23)
CO2: 25 mmol/L (ref 22–32)
Calcium: 8.9 mg/dL (ref 8.9–10.3)
Chloride: 103 mmol/L (ref 98–111)
Creatinine, Ser: 1.27 mg/dL — ABNORMAL HIGH (ref 0.61–1.24)
GFR calc Af Amer: 59 mL/min — ABNORMAL LOW (ref >60)
GFR calc non Af Amer: 51 mL/min — ABNORMAL LOW (ref >60)
Glucose, Bld: 130 mg/dL — ABNORMAL HIGH (ref 70–99)
Potassium: 3.9 mmol/L (ref 3.5–5.1)
Sodium: 137 mmol/L (ref 135–145)
Total Bilirubin: 0.6 mg/dL (ref 0.3–1.2)
Total Protein: 5.9 g/dL — ABNORMAL LOW (ref 6.5–8.1)

## 2020-06-13 LAB — BLOOD GAS, ARTERIAL
Acid-Base Excess: 0.4 mmol/L (ref 0.0–2.0)
Bicarbonate: 24.3 mmol/L (ref 20.0–28.0)
Drawn by: 50144
FIO2: 21
O2 Saturation: 94.3 %
Patient temperature: 37
pCO2 arterial: 37.7 mmHg (ref 32.0–48.0)
pH, Arterial: 7.425 (ref 7.350–7.450)
pO2, Arterial: 69.4 mmHg — ABNORMAL LOW (ref 83.0–108.0)

## 2020-06-13 LAB — URINALYSIS, ROUTINE W REFLEX MICROSCOPIC
Bacteria, UA: NONE SEEN
Bilirubin Urine: NEGATIVE
Glucose, UA: NEGATIVE mg/dL
Hgb urine dipstick: NEGATIVE
Ketones, ur: NEGATIVE mg/dL
Nitrite: NEGATIVE
Protein, ur: NEGATIVE mg/dL
Specific Gravity, Urine: 1.017 (ref 1.005–1.030)
pH: 5 (ref 5.0–8.0)

## 2020-06-13 LAB — CBC
HCT: 30.3 % — ABNORMAL LOW (ref 39.0–52.0)
Hemoglobin: 9.7 g/dL — ABNORMAL LOW (ref 13.0–17.0)
MCH: 31.1 pg (ref 26.0–34.0)
MCHC: 32 g/dL (ref 30.0–36.0)
MCV: 97.1 fL (ref 80.0–100.0)
Platelets: 123 10*3/uL — ABNORMAL LOW (ref 150–400)
RBC: 3.12 MIL/uL — ABNORMAL LOW (ref 4.22–5.81)
RDW: 13.8 % (ref 11.5–15.5)
WBC: 3.8 10*3/uL — ABNORMAL LOW (ref 4.0–10.5)
nRBC: 0 % (ref 0.0–0.2)

## 2020-06-13 LAB — SARS CORONAVIRUS 2 (TAT 6-24 HRS): SARS Coronavirus 2: NEGATIVE

## 2020-06-13 LAB — BRAIN NATRIURETIC PEPTIDE: B Natriuretic Peptide: 751 pg/mL — ABNORMAL HIGH (ref 0.0–100.0)

## 2020-06-13 LAB — PROTIME-INR
INR: 1.2 (ref 0.8–1.2)
Prothrombin Time: 14.9 seconds (ref 11.4–15.2)

## 2020-06-13 LAB — APTT: aPTT: 41 seconds — ABNORMAL HIGH (ref 24–36)

## 2020-06-13 LAB — SURGICAL PCR SCREEN
MRSA, PCR: NEGATIVE
Staphylococcus aureus: NEGATIVE

## 2020-06-13 NOTE — Progress Notes (Signed)
PCP: Dr. Ginette Pitman Cardiologist: Dr. Aundra Dubin Pacemaker: Contacted the Device clinic for orders, the following message was sent: "This patient has not been seen in our device clinic. It appears he is still managed by the following: Paraschos, Alexander, MD."  Release of information sent, Device orders faxed to [facility] Cardiology clinic.  EKG: today CXR: today ECHO: 05/14/20 Stress Test:01/21/20 Cardiac Cath:05/22/20  Going for Covid testing today, aware to self quarantine afterwards  [employer] emailed and aware of surgery date/time. Dual chamber pacemaker  Patient denies shortness of breath, fever, cough, and chest pain at PAT appointment.  Patient verbalized understanding of instructions provided today at the PAT appointment.  Patient asked to review instructions at home and day of surgery.

## 2020-06-16 ENCOUNTER — Encounter (HOSPITAL_COMMUNITY): Payer: Self-pay

## 2020-06-16 MED ORDER — SODIUM CHLORIDE 0.9 % IV SOLN
1.5000 g | INTRAVENOUS | Status: AC
  Administered 2020-06-17: 1.5 g via INTRAVENOUS
  Filled 2020-06-16: qty 1.5

## 2020-06-16 MED ORDER — SODIUM CHLORIDE 0.9 % IV SOLN
INTRAVENOUS | Status: DC
  Filled 2020-06-16: qty 30

## 2020-06-16 MED ORDER — DEXMEDETOMIDINE HCL IN NACL 400 MCG/100ML IV SOLN
0.1000 ug/kg/h | INTRAVENOUS | Status: AC
  Administered 2020-06-17: .7 ug/kg/h via INTRAVENOUS
  Administered 2020-06-17: 42.16 ug via INTRAVENOUS
  Filled 2020-06-16: qty 100

## 2020-06-16 MED ORDER — VANCOMYCIN HCL 1500 MG/300ML IV SOLN
1500.0000 mg | INTRAVENOUS | Status: AC
  Administered 2020-06-17: 1500 mg via INTRAVENOUS
  Filled 2020-06-16 (×2): qty 300

## 2020-06-16 MED ORDER — MAGNESIUM SULFATE 50 % IJ SOLN
40.0000 meq | INTRAMUSCULAR | Status: DC
  Filled 2020-06-16: qty 9.85

## 2020-06-16 MED ORDER — POTASSIUM CHLORIDE 2 MEQ/ML IV SOLN
80.0000 meq | INTRAVENOUS | Status: DC
  Filled 2020-06-16: qty 40

## 2020-06-16 MED ORDER — NOREPINEPHRINE 4 MG/250ML-% IV SOLN
0.0000 ug/min | INTRAVENOUS | Status: AC
  Administered 2020-06-17: 2 ug/min via INTRAVENOUS
  Filled 2020-06-16: qty 250

## 2020-06-16 NOTE — Anesthesia Preprocedure Evaluation (Addendum)
Anesthesia Evaluation  Patient identified by MRN, date of birth, ID band Patient awake    Reviewed: Allergy & Precautions, NPO status , Patient's Chart, lab work & pertinent test results, reviewed documented beta blocker date and time   History of Anesthesia Complications Negative for: history of anesthetic complications  Airway Mallampati: II  TM Distance: >3 FB Neck ROM: Full    Dental  (+) Dental Advisory Given, Partial Lower, Missing   Pulmonary former smoker,    Pulmonary exam normal        Cardiovascular hypertension, Pt. on home beta blockers and Pt. on medications + Past MI and +CHF  + pacemaker + Valvular Problems/Murmurs AS  Rhythm:Regular Rate:Normal + Systolic murmurs  '21 Cath - Preserved cardiac output.  2. Low filling pressures.  3. Mild pulmonary hypertension.  4. 80% stenosis mid PLV branch off RCA (known from prior cath).  5. Known occlusion of LCx after OM1.  6. Moderate disease in LAD, 50% proximal and 50-60% distal.   7. Known severe AS from echo and TEE, valve not crossed.   '21 TEE - EF 20 to 25%. The left ventricle demonstrates global hypokinesis. The left ventricular internal cavity size was moderately dilated. Right ventricular systolic function is mildly reduced. RV mildly enlarged. Left atrial size was moderately dilated. Right atrial size was mildly dilated.Trivial MR and AI. Severe AS. Aortic valve area, by VTI measures 0.56 cm. Aortic valve mean gradient measures 27.0 mmHg. Dimensionless index 0.24. Grade 3 plaque in the descending thoracic aorta and arch.     Neuro/Psych  Lumbar radiculopathy  CVA, No Residual Symptoms negative psych ROS   GI/Hepatic negative GI ROS, Neg liver ROS,   Endo/Other  negative endocrine ROS  Renal/GU CRFRenal disease    Prostate cancer     Musculoskeletal  (+) Arthritis ,   Abdominal   Peds  Hematology  (+) anemia ,  On Elqiuis Thrombocytopenia     Anesthesia Other Findings Covid test negative   Reproductive/Obstetrics                          Anesthesia Physical Anesthesia Plan  ASA: IV  Anesthesia Plan: MAC   Post-op Pain Management:    Induction: Intravenous  PONV Risk Score and Plan: 1 and Propofol infusion and Treatment may vary due to age or medical condition  Airway Management Planned: Natural Airway and Simple Face Mask  Additional Equipment: Arterial line  Intra-op Plan:   Post-operative Plan:   Informed Consent: I have reviewed the patients History and Physical, chart, labs and discussed the procedure including the risks, benefits and alternatives for the proposed anesthesia with the patient or authorized representative who has indicated his/her understanding and acceptance.       Plan Discussed with: CRNA and Anesthesiologist  Anesthesia Plan Comments:       Anesthesia Quick Evaluation

## 2020-06-16 NOTE — Progress Notes (Signed)
Anesthesia Chart Review:  Case: [id number] Date/Time: 06/17/20 0715   Procedures:      TRANSCATHETER AORTIC VALVE REPLACEMENT, TRANSFEMORAL (N/A Chest)     TRANSESOPHAGEAL ECHOCARDIOGRAM (TEE) (N/A )   Anesthesia type: General   Pre-op diagnosis: Severe Aortic Stenosis   Location: [facility] OR ROOM 16 / [place] OR   Surgeons: Burnell Blanks, MD    CT Surgeon: Darylene Price, MD   DISCUSSION: Patient is an 86 year old male scheduled for the above procedure.  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 4.5 cm ascending TAA on 05/30/20 CTA chest.  Last Eliquis 06/11/2020.  [employer] Pacemaker is followed at [facility] cardiology. Perioperative device form is still pending, but PAT RN did send notification to [employer] Rep of surgery plans.   06/13/2020 presurgical COVID-19 test negative.  Anesthesia team to evaluate on the day of surgery.  VS: BP 115/81   Pulse (!) 113   Temp 36.5 C (Oral)   Resp 20   Ht 6' (1.829 m)   Wt 84.3 kg   SpO2 100%   BMI 25.21 kg/m  HR 74 bpm on 06/13/20 1404 EKG.   PROVIDERS: Tracie Harrier, MD is PCP - Loralie Champagne, MD is HF cardiologist - Isaias Cowman, MD is primary cardiologist [facility] Everywhere) - Antony Contras, MD is neurologist. Seen on 07/02/19 for transient episode of diplopia, dizziness, and LUE dysmetria 04/2019 in the setting of acute CHF. No acute  infarct on head CT (no MRI due to PPM). Question of TIA or small CVA not visible on CT. He was continued on Eliquis and statin. Last visit 04/07/20 with Frann Rider, NP. John Giovanni, MD is urologist - Delight Hoh, MD is HEM-ONC. Last visit 05/27/20.  Wallene Huh, MD is pulmonologist ([place])   LABS: Preoperative labs noted. Overall, H/H and Creatinine appear within his recent baseline. PTT 41, PT/INR WNL. Last Eliquis 06/11/20.  (all labs ordered are listed, but only abnormal results are displayed)  Labs Reviewed  URINALYSIS, ROUTINE W REFLEX MICROSCOPIC - Abnormal; Notable for the following components:      Result Value   Leukocytes,Ua SMALL (*)    All other components within normal limits  APTT - Abnormal; Notable for the following components:   aPTT 41 (*)    All other components within normal limits  BLOOD GAS, ARTERIAL - Abnormal; Notable for the following components:   pO2, Arterial 69.4 (*)    All other components within normal limits  BRAIN NATRIURETIC PEPTIDE - Abnormal; Notable for the following components:   B Natriuretic Peptide 751.0 (*)    All other components within normal limits  CBC - Abnormal; Notable for the following components:   WBC 3.8 (*)    RBC 3.12 (*)    Hemoglobin 9.7 (*)    HCT 30.3 (*)    Platelets 123 (*)    All other components within normal limits  COMPREHENSIVE METABOLIC PANEL - Abnormal; Notable for the following components:  Glucose, Bld 130 (*)    Creatinine, Ser 1.27 (*)    Total Protein 5.9 (*)    Albumin 2.8 (*)    GFR calc non Af Amer 51 (*)    GFR calc Af Amer 59 (*)    All other components within normal limits  SURGICAL PCR SCREEN  HEMOGLOBIN A1C  PROTIME-INR  TYPE AND SCREEN   Lab Results  Component Value Date   CREATININE 1.27 (H) 06/13/2020   CREATININE 1.13 05/27/2020   CREATININE 1.30 (H) 05/21/2020   CBC Latest Ref Rng & Units 06/13/2020 05/27/2020 05/22/2020  WBC 4.0 - 10.5 K/uL 3.8(L) 4.2 -   Hemoglobin 13.0 - 17.0 g/dL 9.7(L) 10.7(L) 9.2(L)  Hematocrit 39 - 52 % 30.3(L) 31.3(L) 27.0(L)  Platelets 150 - 400 K/uL 123(L) 105(L) -    IMAGES: CXR 06/13/20: FINDINGS: Left pacer remains in place, unchanged. Lungs are clear. Heart is normal size. No effusions or acute bony abnormality. IMPRESSION: No acute cardiopulmonary disease.   EKG: 06/13/20: Atrial-paced rhythm with prolonged AV conduction with frequent ventricular-paced complexes in a pattern of bigeminy ST & T wave abnormality, consider lateral ischemia Abnormal ECG Confirmed by Cherlynn Kaiser [phone]) on 06/16/2020 1:34:39 AM   CV: CT Coronary 05/30/20: IMPRESSION: Significant motion artifact at the annulus may impact diagnostic accuracy of measurements for TAVR. 1. Tricuspid aortic valve. Severely reduced cusp separation. Moderately thickened, severely calcified aortic valve cusps. 2. AV calcium score: 2742. 3. Annulus area 523 mm, suitable for a 26 mm Sapien 3 valve. 4. Adequate coronary artery heights from annulus, however RCA stent crosses ostium and protrudes into aorta. 5. No left atrial appendage thrombus. 6. Optimum Fluoroscopic Angle for Delivery: LAO 24, CAU 22 7. Severe mixed atherosclerotic plaque in the thoracic aorta.   RHC/LHC 05/22/20: 1. Preserved cardiac output.  2. Low filling pressures.  3. Mild pulmonary hypertension.  4. 80% stenosis mid PLV branch off RCA (known from prior cath).  5. Known occlusion of LCx after OM1.  6. Moderate disease in LAD, 50% proximal and 50-60% distal.   7. Known severe AS from echo and TEE, valve not crossed.  Referred for TAVR evaluation.    TEE 05/14/20: IMPRESSIONS  1. Left ventricular ejection fraction, by estimation, is 20 to 25%. The  left ventricle has severely decreased function. The left ventricle  demonstrates global hypokinesis. The left ventricular internal cavity size  was moderately dilated.  2. Right ventricular systolic function is  mildly reduced. The right  ventricular size is mildly enlarged.  3. Left atrial size was moderately dilated. No left atrial/left atrial  appendage thrombus was detected.  4. Right atrial size was mildly dilated. No ASD or PFO by color doppler.  5. The mitral valve is normal in structure. Trivial mitral valve  regurgitation. No evidence of mitral stenosis.  6. The aortic valve is tricuspid. Aortic valve regurgitation is trivial.  Severe aortic valve stenosis. Aortic valve area, by VTI measures 0.56 cm.  Aortic valve mean gradient measures 27.0 mmHg. Dimensionless index 0.24.  7. Grade 3 plaque in the descending thoracic aorta and arch.    Carotid US 05/20/19: Summary:  Right Carotid: Velocities in the right ICA are consistent with a 1-39%  stenosis.  Left Carotid: Velocities in the left ICA are consistent with a 1-39%  stenosis.  Vertebrals: Bilateral vertebral arteries demonstrate antegrade flow.  Subclavians: Normal flow hemodynamics were seen in bilateral subclavian        arteries.    Past Medical History:  Diagnosis Date  . Anginal pain ([place])   . Aortic stenosis   . Basal cell carcinoma, ear 03/31/2015  . Bradycardia 07/03/2014  . CHF (congestive heart failure) ([place])   . DDD (degenerative disc disease), lumbar 08/06/2015  . H/O cardiac catheterization 07/03/2014   Overview:  Cypher stent proximal and distal RCA 07/18/05 and TAXUS stent mid LAD 07/20/05 at [facility]  . HTN (hypertension) 07/03/2014  . Hyperlipidemia 07/03/2014  . Lumbosacral radiculopathy at S1 08/22/2015  . MI (myocardial infarction) ([place]) 07/03/2014   Overview:  Mi 07/17/05  . Myocardial infarction ([place])   . Normocytic normochromic anemia 09/14/2015  . Pacemaker 07/03/2014   Overview:  Dual chamber pacemaker 08/11/11  . Pancreatic mass    not hypermetabolic on PET-CT imaging  . Prostate cancer ([place])    metastatic  . Shortness of breath dyspnea   . SOB (shortness of breath) on exertion 05/13/2016     Past Surgical History:  Procedure Laterality Date  . CARDIAC CATHETERIZATION Bilateral 07/14/2016   Procedure: Right/Left Heart Cath and Coronary Angiography;  Surgeon: Isaias Cowman, MD;  Location: [place] CV LAB;  Service: Cardiovascular;  Laterality: Bilateral;  . CORONARY ANGIOPLASTY    . HERNIA REPAIR    . LEFT HEART CATH AND CORONARY ANGIOGRAPHY N/A 05/09/2019   Procedure: LEFT HEART CATH AND CORONARY ANGIOGRAPHY;  Surgeon: Corey Skains, MD;  Location: [place] CV LAB;  Service: Cardiovascular;  Laterality: N/A;  . PROSTATE BIOPSY N/A 12/11/2019   Procedure: PROSTATE BIOPSY;  Surgeon: Abbie Sons, MD;  Location: [facility] ORS;  Service: Urology;  Laterality: N/A;  . RIGHT HEART CATH N/A 05/17/2019   Procedure: RIGHT HEART CATH;  Surgeon: Larey Dresser, MD;  Location: [place] CV LAB;  Service: Cardiovascular;  Laterality: N/A;  . RIGHT HEART CATH AND CORONARY ANGIOGRAPHY N/A 05/22/2020   Procedure: RIGHT HEART CATH AND CORONARY ANGIOGRAPHY;  Surgeon: Larey Dresser, MD;  Location: [place] CV LAB;  Service: Cardiovascular;  Laterality: N/A;  . TEE WITHOUT CARDIOVERSION N/A 05/14/2020   Procedure: TRANSESOPHAGEAL ECHOCARDIOGRAM (TEE);  Surgeon: Larey Dresser, MD;  Location: [facility] ENDOSCOPY;  Service: Cardiovascular;  Laterality: N/A;  . TRANSRECTAL ULTRASOUND N/A 12/11/2019   Procedure: TRANSRECTAL ULTRASOUND;  Surgeon: Abbie Sons, MD;  Location: [facility] ORS;  Service: Urology;  Laterality: N/A;    MEDICATIONS: . acetaminophen (TYLENOL) 500 MG tablet  . albuterol (VENTOLIN HFA) 108 (90 Base) MCG/ACT inhaler  . apixaban (ELIQUIS) 5 MG TABS tablet  . atorvastatin (LIPITOR) 80 MG tablet  . carvedilol (COREG) 6.25 MG tablet  . furosemide (LASIX) 20 MG tablet  . ipratropium-albuterol (DUONEB) 0.5-2.5 (3) MG/3ML SOLN  . ranolazine (RANEXA) 500 MG 12 hr tablet  . spironolactone (ALDACTONE) 25 MG tablet  . tamsulosin (FLOMAX) 0.4 MG CAPS capsule  .  venlafaxine XR (EFFEXOR-XR) 75 MG 24 hr capsule   No current facility-administered medications for this encounter.    Myra Gianotti, PA-C Surgical Short Stay/Anesthesiology [facility] Phone [phone] [facility] Phone [phone] 06/16/2020 10:30 AM

## 2020-06-17 ENCOUNTER — Inpatient Hospital Stay (HOSPITAL_COMMUNITY): Payer: Medicare Other

## 2020-06-17 ENCOUNTER — Inpatient Hospital Stay (HOSPITAL_COMMUNITY): Payer: Medicare Other | Admitting: Vascular Surgery

## 2020-06-17 ENCOUNTER — Encounter (HOSPITAL_COMMUNITY)
Admission: RE | Disposition: A | Discharge status: Home / Self Care | Payer: Self-pay | Source: Home / Self Care | Attending: Thoracic Surgery (Cardiothoracic Vascular Surgery)

## 2020-06-17 ENCOUNTER — Encounter (HOSPITAL_COMMUNITY): Payer: Self-pay | Admitting: Cardiovascular Disease

## 2020-06-17 ENCOUNTER — Other Ambulatory Visit: Payer: Self-pay

## 2020-06-17 ENCOUNTER — Inpatient Hospital Stay (HOSPITAL_COMMUNITY)
Admission: RE | Admit: 2020-06-17 | Discharge: 2020-06-18 | DRG: 266 | Disposition: A | Discharge status: Home / Self Care | Payer: Medicare Other | Attending: Thoracic Surgery (Cardiothoracic Vascular Surgery) | Admitting: Thoracic Surgery (Cardiothoracic Vascular Surgery)

## 2020-06-17 ENCOUNTER — Inpatient Hospital Stay (HOSPITAL_COMMUNITY): Payer: Medicare Other | Admitting: Anesthesiology

## 2020-06-17 DIAGNOSIS — Z8546 Personal history of malignant neoplasm of prostate: Secondary | ICD-10-CM | POA: Diagnosis not present

## 2020-06-17 DIAGNOSIS — I350 Nonrheumatic aortic (valve) stenosis: Secondary | ICD-10-CM

## 2020-06-17 DIAGNOSIS — Z85828 Personal history of other malignant neoplasm of skin: Secondary | ICD-10-CM

## 2020-06-17 DIAGNOSIS — Z923 Personal history of irradiation: Secondary | ICD-10-CM

## 2020-06-17 DIAGNOSIS — Z006 Encounter for examination for normal comparison and control in clinical research program: Secondary | ICD-10-CM

## 2020-06-17 DIAGNOSIS — I130 Hypertensive heart and chronic kidney disease with heart failure and stage 1 through stage 4 chronic kidney disease, or unspecified chronic kidney disease: Secondary | ICD-10-CM | POA: Diagnosis present

## 2020-06-17 DIAGNOSIS — I272 Pulmonary hypertension, unspecified: Secondary | ICD-10-CM | POA: Diagnosis present

## 2020-06-17 DIAGNOSIS — I252 Old myocardial infarction: Secondary | ICD-10-CM

## 2020-06-17 DIAGNOSIS — Z8673 Personal history of transient ischemic attack (TIA), and cerebral infarction without residual deficits: Secondary | ICD-10-CM | POA: Diagnosis not present

## 2020-06-17 DIAGNOSIS — D649 Anemia, unspecified: Secondary | ICD-10-CM | POA: Diagnosis present

## 2020-06-17 DIAGNOSIS — I2510 Atherosclerotic heart disease of native coronary artery without angina pectoris: Secondary | ICD-10-CM | POA: Diagnosis present

## 2020-06-17 DIAGNOSIS — Z950 Presence of cardiac pacemaker: Secondary | ICD-10-CM | POA: Diagnosis present

## 2020-06-17 DIAGNOSIS — I495 Sick sinus syndrome: Secondary | ICD-10-CM | POA: Diagnosis present

## 2020-06-17 DIAGNOSIS — E785 Hyperlipidemia, unspecified: Secondary | ICD-10-CM | POA: Diagnosis present

## 2020-06-17 DIAGNOSIS — Z87891 Personal history of nicotine dependence: Secondary | ICD-10-CM

## 2020-06-17 DIAGNOSIS — Z952 Presence of prosthetic heart valve: Secondary | ICD-10-CM

## 2020-06-17 DIAGNOSIS — D696 Thrombocytopenia, unspecified: Secondary | ICD-10-CM | POA: Diagnosis present

## 2020-06-17 DIAGNOSIS — M5136 Other intervertebral disc degeneration, lumbar region: Secondary | ICD-10-CM | POA: Diagnosis present

## 2020-06-17 DIAGNOSIS — I255 Ischemic cardiomyopathy: Secondary | ICD-10-CM | POA: Diagnosis present

## 2020-06-17 DIAGNOSIS — I10 Essential (primary) hypertension: Secondary | ICD-10-CM | POA: Diagnosis present

## 2020-06-17 DIAGNOSIS — M5417 Radiculopathy, lumbosacral region: Secondary | ICD-10-CM | POA: Diagnosis present

## 2020-06-17 DIAGNOSIS — I5023 Acute on chronic systolic (congestive) heart failure: Secondary | ICD-10-CM | POA: Diagnosis present

## 2020-06-17 DIAGNOSIS — K869 Disease of pancreas, unspecified: Secondary | ICD-10-CM | POA: Diagnosis present

## 2020-06-17 DIAGNOSIS — D631 Anemia in chronic kidney disease: Secondary | ICD-10-CM | POA: Diagnosis present

## 2020-06-17 DIAGNOSIS — Z888 Allergy status to other drugs, medicaments and biological substances status: Secondary | ICD-10-CM

## 2020-06-17 DIAGNOSIS — N183 Chronic kidney disease, stage 3 unspecified: Secondary | ICD-10-CM | POA: Diagnosis present

## 2020-06-17 DIAGNOSIS — Z955 Presence of coronary angioplasty implant and graft: Secondary | ICD-10-CM

## 2020-06-17 DIAGNOSIS — K8689 Other specified diseases of pancreas: Secondary | ICD-10-CM | POA: Diagnosis present

## 2020-06-17 DIAGNOSIS — C61 Malignant neoplasm of prostate: Secondary | ICD-10-CM | POA: Diagnosis present

## 2020-06-17 DIAGNOSIS — I712 Thoracic aortic aneurysm, without rupture, unspecified: Secondary | ICD-10-CM

## 2020-06-17 HISTORY — PX: TRANSCATHETER AORTIC VALVE REPLACEMENT, TRANSFEMORAL: SHX6400

## 2020-06-17 HISTORY — DX: Presence of prosthetic heart valve: Z95.2

## 2020-06-17 HISTORY — PX: TEE WITHOUT CARDIOVERSION: SHX5443

## 2020-06-17 LAB — POCT I-STAT 7, (LYTES, BLD GAS, ICA,H+H)
Acid-Base Excess: 1 mmol/L (ref 0.0–2.0)
Bicarbonate: 27.6 mmol/L (ref 20.0–28.0)
Calcium, Ion: 1.34 mmol/L (ref 1.15–1.40)
HCT: 28 % — ABNORMAL LOW (ref 39.0–52.0)
Hemoglobin: 9.5 g/dL — ABNORMAL LOW (ref 13.0–17.0)
O2 Saturation: 99 %
Potassium: 4 mmol/L (ref 3.5–5.1)
Sodium: 138 mmol/L (ref 135–145)
TCO2: 29 mmol/L (ref 22–32)
pCO2 arterial: 53.1 mmHg — ABNORMAL HIGH (ref 32.0–48.0)
pH, Arterial: 7.324 — ABNORMAL LOW (ref 7.350–7.450)
pO2, Arterial: 161 mmHg — ABNORMAL HIGH (ref 83.0–108.0)

## 2020-06-17 LAB — POCT I-STAT, CHEM 8
BUN: 17 mg/dL (ref 8–23)
BUN: 14 mg/dL (ref 8–23)
Calcium, Ion: 1.31 mmol/L (ref 1.15–1.40)
Calcium, Ion: 1.21 mmol/L (ref 1.15–1.40)
Chloride: 101 mmol/L (ref 98–111)
Chloride: 105 mmol/L (ref 98–111)
Creatinine, Ser: 1.1 mg/dL (ref 0.61–1.24)
Creatinine, Ser: 0.9 mg/dL (ref 0.61–1.24)
Glucose, Bld: 125 mg/dL — ABNORMAL HIGH (ref 70–99)
Glucose, Bld: 104 mg/dL — ABNORMAL HIGH (ref 70–99)
HCT: 26 % — ABNORMAL LOW (ref 39.0–52.0)
HCT: 22 % — ABNORMAL LOW (ref 39.0–52.0)
Hemoglobin: 8.8 g/dL — ABNORMAL LOW (ref 13.0–17.0)
Hemoglobin: 7.5 g/dL — ABNORMAL LOW (ref 13.0–17.0)
Potassium: 4 mmol/L (ref 3.5–5.1)
Potassium: 3.5 mmol/L (ref 3.5–5.1)
Sodium: 137 mmol/L (ref 135–145)
Sodium: 143 mmol/L (ref 135–145)
TCO2: 28 mmol/L (ref 22–32)
TCO2: 22 mmol/L (ref 22–32)

## 2020-06-17 SURGERY — IMPLANTATION, AORTIC VALVE, TRANSCATHETER, FEMORAL APPROACH
Anesthesia: Monitor Anesthesia Care | Site: Chest

## 2020-06-17 MED ORDER — SODIUM CHLORIDE 0.9% FLUSH
3.0000 mL | Freq: Two times a day (BID) | INTRAVENOUS | Status: DC
  Administered 2020-06-18: 3 mL via INTRAVENOUS

## 2020-06-17 MED ORDER — ACETAMINOPHEN 650 MG RE SUPP: 650.0000 mg | Freq: Four times a day (QID) | RECTAL | Status: DC | PRN

## 2020-06-17 MED ORDER — SODIUM CHLORIDE 0.9 % IV SOLN
INTRAVENOUS | Status: DC | PRN
  Administered 2020-06-17: 1500 mL

## 2020-06-17 MED ORDER — CHLORHEXIDINE GLUCONATE 0.12 % MT SOLN
15.0000 mL | Freq: Once | OROMUCOSAL | Status: AC
  Administered 2020-06-17: 15 mL via OROMUCOSAL
  Filled 2020-06-17: qty 15

## 2020-06-17 MED ORDER — SODIUM CHLORIDE 0.9 % IV SOLN: INTRAVENOUS | Status: DC | PRN

## 2020-06-17 MED ORDER — SODIUM CHLORIDE 0.9 % IV SOLN: INTRAVENOUS | Status: DC

## 2020-06-17 MED ORDER — FENTANYL CITRATE (PF) 100 MCG/2ML IJ SOLN: 25.0000 ug | INTRAMUSCULAR | Status: DC | PRN

## 2020-06-17 MED ORDER — VENLAFAXINE HCL ER 75 MG PO CP24: 75.0000 mg | ORAL_CAPSULE | Freq: Every day | ORAL | Status: DC | PRN

## 2020-06-17 MED ORDER — PROPOFOL 500 MG/50ML IV EMUL
INTRAVENOUS | Status: DC | PRN
Start: 2020-06-17 — End: 2020-06-17
  Administered 2020-06-17: 25 ug/kg/min via INTRAVENOUS

## 2020-06-17 MED ORDER — PHENYLEPHRINE HCL-NACL 20-0.9 MG/250ML-% IV SOLN: 0.0000 ug/min | INTRAVENOUS | Status: DC

## 2020-06-17 MED ORDER — RANOLAZINE ER 500 MG PO TB12
500.0000 mg | ORAL_TABLET | Freq: Two times a day (BID) | ORAL | Status: DC
  Administered 2020-06-17 – 2020-06-18 (×3): 500 mg via ORAL
  Filled 2020-06-17 (×3): qty 1

## 2020-06-17 MED ORDER — CHLORHEXIDINE GLUCONATE 4 % EX LIQD: 60.0000 mL | Freq: Once | CUTANEOUS | Status: DC

## 2020-06-17 MED ORDER — ONDANSETRON HCL 4 MG/2ML IJ SOLN: 4.0000 mg | Freq: Four times a day (QID) | INTRAMUSCULAR | Status: DC | PRN

## 2020-06-17 MED ORDER — PROPOFOL 10 MG/ML IV BOLUS
INTRAVENOUS | Status: DC | PRN
  Administered 2020-06-17: 20 mg via INTRAVENOUS
  Administered 2020-06-17 (×3): 10 mg via INTRAVENOUS

## 2020-06-17 MED ORDER — MORPHINE SULFATE (PF) 4 MG/ML IV SOLN: 1.0000 mg | INTRAVENOUS | Status: DC | PRN

## 2020-06-17 MED ORDER — ACETAMINOPHEN 325 MG PO TABS: 650.0000 mg | ORAL_TABLET | Freq: Four times a day (QID) | ORAL | Status: DC | PRN

## 2020-06-17 MED ORDER — LIDOCAINE HCL (PF) 1 % IJ SOLN
INTRAMUSCULAR | Status: AC
  Filled 2020-06-17: qty 30

## 2020-06-17 MED ORDER — VASOPRESSIN 20 UNIT/ML IV SOLN
INTRAVENOUS | Status: AC
  Filled 2020-06-17: qty 1

## 2020-06-17 MED ORDER — SODIUM CHLORIDE 0.9% FLUSH: 3.0000 mL | INTRAVENOUS | Status: DC | PRN

## 2020-06-17 MED ORDER — OXYCODONE HCL 5 MG PO TABS: 5.0000 mg | ORAL_TABLET | ORAL | Status: DC | PRN

## 2020-06-17 MED ORDER — CHLORHEXIDINE GLUCONATE 4 % EX LIQD: 30.0000 mL | CUTANEOUS | Status: DC

## 2020-06-17 MED ORDER — IODIXANOL 320 MG/ML IV SOLN
INTRAVENOUS | Status: DC | PRN
  Administered 2020-06-17: 60 mL

## 2020-06-17 MED ORDER — HEPARIN SODIUM (PORCINE) 1000 UNIT/ML IJ SOLN
INTRAMUSCULAR | Status: DC | PRN
Start: 2020-06-17 — End: 2020-06-17
  Administered 2020-06-17: 13000 [IU] via INTRAVENOUS

## 2020-06-17 MED ORDER — IPRATROPIUM-ALBUTEROL 0.5-2.5 (3) MG/3ML IN SOLN
3.0000 mL | Freq: Four times a day (QID) | RESPIRATORY_TRACT | Status: DC | PRN
  Administered 2020-06-17: 3 mL via RESPIRATORY_TRACT
  Filled 2020-06-17: qty 3

## 2020-06-17 MED ORDER — SODIUM CHLORIDE 0.9 % IV SOLN
1.5000 g | Freq: Two times a day (BID) | INTRAVENOUS | Status: DC
  Administered 2020-06-17 – 2020-06-18 (×2): 1.5 g via INTRAVENOUS
  Filled 2020-06-17 (×4): qty 1.5

## 2020-06-17 MED ORDER — ONDANSETRON HCL 4 MG/2ML IJ SOLN: 4.0000 mg | Freq: Once | INTRAMUSCULAR | Status: DC | PRN

## 2020-06-17 MED ORDER — OXYCODONE HCL 5 MG PO TABS: 5.0000 mg | ORAL_TABLET | Freq: Once | ORAL | Status: DC | PRN

## 2020-06-17 MED ORDER — PROPOFOL 10 MG/ML IV BOLUS
INTRAVENOUS | Status: AC
  Filled 2020-06-17: qty 20

## 2020-06-17 MED ORDER — OXYCODONE HCL 5 MG/5ML PO SOLN: 5.0000 mg | Freq: Once | ORAL | Status: DC | PRN

## 2020-06-17 MED ORDER — VANCOMYCIN HCL IN DEXTROSE 750-5 MG/150ML-% IV SOLN
750.0000 mg | Freq: Once | INTRAVENOUS | Status: AC
  Administered 2020-06-18: 750 mg via INTRAVENOUS
  Filled 2020-06-17: qty 150

## 2020-06-17 MED ORDER — TRAMADOL HCL 50 MG PO TABS: 50.0000 mg | ORAL_TABLET | ORAL | Status: DC | PRN

## 2020-06-17 MED ORDER — ASPIRIN 81 MG PO CHEW
81.0000 mg | CHEWABLE_TABLET | Freq: Every day | ORAL | Status: DC
  Administered 2020-06-18: 81 mg via ORAL
  Filled 2020-06-17: qty 1

## 2020-06-17 MED ORDER — 0.9 % SODIUM CHLORIDE (POUR BTL) OPTIME
TOPICAL | Status: DC | PRN
  Administered 2020-06-17: 1000 mL

## 2020-06-17 MED ORDER — SODIUM CHLORIDE 0.9 % IV SOLN: 250.0000 mL | INTRAVENOUS | Status: DC | PRN

## 2020-06-17 MED ORDER — TAMSULOSIN HCL 0.4 MG PO CAPS: 0.4000 mg | ORAL_CAPSULE | Freq: Every day | ORAL | Status: DC

## 2020-06-17 MED ORDER — SODIUM CHLORIDE 0.9 % IV SOLN
INTRAVENOUS | Status: AC
  Filled 2020-06-17 (×3): qty 1.2

## 2020-06-17 MED ORDER — PROTAMINE SULFATE 10 MG/ML IV SOLN
INTRAVENOUS | Status: DC | PRN
  Administered 2020-06-17: 130 mg via INTRAVENOUS

## 2020-06-17 MED ORDER — SODIUM CHLORIDE 0.9 % IV SOLN: INTRAVENOUS | Status: AC

## 2020-06-17 MED ORDER — LIDOCAINE HCL 1 % IJ SOLN
INTRAMUSCULAR | Status: DC | PRN
  Administered 2020-06-17: 20 mL

## 2020-06-17 MED ORDER — ATORVASTATIN CALCIUM 80 MG PO TABS
80.0000 mg | ORAL_TABLET | Freq: Every day | ORAL | Status: DC
  Administered 2020-06-17: 80 mg via ORAL
  Filled 2020-06-17: qty 1

## 2020-06-17 MED ORDER — NITROGLYCERIN IN D5W 200-5 MCG/ML-% IV SOLN: 0.0000 ug/min | INTRAVENOUS | Status: DC

## 2020-06-17 SURGICAL SUPPLY — 80 items
BAG DECANTER FOR FLEXI CONT (MISCELLANEOUS) ×4 IMPLANT
BAG SNAP BAND KOVER 36X36 (MISCELLANEOUS) ×4 IMPLANT
BLADE CLIPPER SURG (BLADE) ×4 IMPLANT
BLADE OSCILLATING /SAGITTAL (BLADE) IMPLANT
BLADE STERNUM SYSTEM 6 (BLADE) IMPLANT
BLADE SURG 10 STRL SS (BLADE) IMPLANT
CABLE ADAPT CONN TEMP 6FT (ADAPTER) ×4 IMPLANT
CATH DIAG EXPO 6F AL1 (CATHETERS) ×4 IMPLANT
CATH DIAG EXPO 6F VENT PIG 145 (CATHETERS) ×8 IMPLANT
CATH EXTERNAL FEMALE PUREWICK (CATHETERS) IMPLANT
CATH INFINITI 6F AL2 (CATHETERS) ×4 IMPLANT
CATH S G BIP PACING (CATHETERS) ×4 IMPLANT
CHLORAPREP W/TINT 26 (MISCELLANEOUS) ×4 IMPLANT
CLIP VESOCCLUDE MED 24/CT (CLIP) IMPLANT
CLIP VESOCCLUDE SM WIDE 24/CT (CLIP) IMPLANT
CNTNR URN SCR LID CUP LEK RST (MISCELLANEOUS) ×4 IMPLANT
CONT SPEC 4OZ STRL OR WHT (MISCELLANEOUS) ×4
COVER BACK TABLE 80X110 HD (DRAPES) ×4 IMPLANT
COVER WAND RF STERILE (DRAPES) IMPLANT
DECANTER SPIKE VIAL GLASS SM (MISCELLANEOUS) ×4 IMPLANT
DERMABOND ADVANCED (GAUZE/BANDAGES/DRESSINGS) ×2
DERMABOND ADVANCED .7 DNX12 (GAUZE/BANDAGES/DRESSINGS) ×2 IMPLANT
DEVICE CLOSURE PERCLS PRGLD 6F (VASCULAR PRODUCTS) ×4 IMPLANT
DRAPE INCISE IOBAN 66X45 STRL (DRAPES) IMPLANT
DRSG TEGADERM 4X4.75 (GAUZE/BANDAGES/DRESSINGS) ×8 IMPLANT
ELECT CAUTERY BLADE 6.4 (BLADE) IMPLANT
ELECT REM PT RETURN 9FT ADLT (ELECTROSURGICAL) ×4
ELECTRODE REM PT RTRN 9FT ADLT (ELECTROSURGICAL) ×2 IMPLANT
FELT TEFLON 6X6 (MISCELLANEOUS) IMPLANT
GAUZE SPONGE 4X4 12PLY STRL (GAUZE/BANDAGES/DRESSINGS) ×4 IMPLANT
GLOVE BIO SURGEON STRL SZ7.5 (GLOVE) ×4 IMPLANT
GLOVE BIO SURGEON STRL SZ8 (GLOVE) IMPLANT
GLOVE EUDERMIC 7 POWDERFREE (GLOVE) IMPLANT
GLOVE ORTHO TXT STRL SZ7.5 (GLOVE) ×4 IMPLANT
GOWN STRL REUS W/ TWL LRG LVL3 (GOWN DISPOSABLE) ×2 IMPLANT
GOWN STRL REUS W/ TWL XL LVL3 (GOWN DISPOSABLE) ×8 IMPLANT
GOWN STRL REUS W/TWL LRG LVL3 (GOWN DISPOSABLE) ×2
GOWN STRL REUS W/TWL XL LVL3 (GOWN DISPOSABLE) ×8
GUIDEWIRE SAFE TJ AMPLATZ EXST (WIRE) ×4 IMPLANT
INSERT FOGARTY SM (MISCELLANEOUS) IMPLANT
KIT BASIN OR (CUSTOM PROCEDURE TRAY) ×4 IMPLANT
KIT HEART LEFT (KITS) ×4 IMPLANT
KIT SUCTION CATH 14FR (SUCTIONS) IMPLANT
KIT TURNOVER KIT B (KITS) ×4 IMPLANT
LOOP VESSEL MAXI BLUE (MISCELLANEOUS) IMPLANT
LOOP VESSEL MINI RED (MISCELLANEOUS) IMPLANT
NS IRRIG 1000ML POUR BTL (IV SOLUTION) ×4 IMPLANT
PACK ENDO MINOR (CUSTOM PROCEDURE TRAY) ×4 IMPLANT
PAD ARMBOARD 7.5X6 YLW CONV (MISCELLANEOUS) ×8 IMPLANT
PAD ELECT DEFIB RADIOL ZOLL (MISCELLANEOUS) ×4 IMPLANT
PENCIL BUTTON HOLSTER BLD 10FT (ELECTRODE) IMPLANT
PERCLOSE PROGLIDE 6F (VASCULAR PRODUCTS) ×8
POSITIONER HEAD DONUT 9IN (MISCELLANEOUS) ×4 IMPLANT
SET MICROPUNCTURE 5F STIFF (MISCELLANEOUS) ×4 IMPLANT
SHEATH BRITE TIP 7FR 35CM (SHEATH) ×4 IMPLANT
SHEATH PINNACLE 6F 10CM (SHEATH) ×4 IMPLANT
SHEATH PINNACLE 8F 10CM (SHEATH) ×4 IMPLANT
SLEEVE REPOSITIONING LENGTH 30 (MISCELLANEOUS) ×4 IMPLANT
STOPCOCK MORSE 400PSI 3WAY (MISCELLANEOUS) ×8 IMPLANT
SUT ETHIBOND X763 2 0 SH 1 (SUTURE) IMPLANT
SUT GORETEX CV 4 TH 22 36 (SUTURE) IMPLANT
SUT GORETEX CV4 TH-18 (SUTURE) IMPLANT
SUT MNCRL AB 3-0 PS2 18 (SUTURE) IMPLANT
SUT PROLENE 5 0 C 1 36 (SUTURE) IMPLANT
SUT PROLENE 6 0 C 1 30 (SUTURE) IMPLANT
SUT SILK  1 MH (SUTURE) ×2
SUT SILK 1 MH (SUTURE) ×2 IMPLANT
SUT VIC AB 2-0 CT1 27 (SUTURE)
SUT VIC AB 2-0 CT1 TAPERPNT 27 (SUTURE) IMPLANT
SUT VIC AB 2-0 CTX 36 (SUTURE) IMPLANT
SUT VIC AB 3-0 SH 8-18 (SUTURE) IMPLANT
SYR 50ML LL SCALE MARK (SYRINGE) ×4 IMPLANT
SYR BULB IRRIG 60ML STRL (SYRINGE) IMPLANT
SYR MEDRAD MARK V 150ML (SYRINGE) ×4 IMPLANT
TOWEL GREEN STERILE (TOWEL DISPOSABLE) ×8 IMPLANT
TRANSDUCER W/STOPCOCK (MISCELLANEOUS) ×8 IMPLANT
TRAY FOLEY SLVR 16FR TEMP STAT (SET/KITS/TRAYS/PACK) IMPLANT
VALVE 26 ULTRA SAPIEN KIT (Valve) ×4 IMPLANT
WIRE EMERALD 3MM-J .035X150CM (WIRE) ×4 IMPLANT
WIRE EMERALD 3MM-J .035X260CM (WIRE) ×4 IMPLANT

## 2020-06-17 NOTE — Transfer of Care (Signed)
Immediate Anesthesia Transfer of Care Note  Patient: Kristopher Fernandez  Procedure(s) Performed: TRANSCATHETER AORTIC VALVE REPLACEMENT, TRANSFEMORAL (N/A Chest) TRANSESOPHAGEAL ECHOCARDIOGRAM (TEE) (N/A )  Patient Location: PACU and Cath Lab  Anesthesia Type:MAC  Level of Consciousness: awake, alert  and patient cooperative  Airway & Oxygen Therapy: Patient Spontanous Breathing and Patient connected to nasal cannula oxygen  Post-op Assessment: Report given to RN and Post -op Vital signs reviewed and stable  Post vital signs: Reviewed and stable  Last Vitals:  Vitals Value Taken Time  BP    Temp    Pulse    Resp    SpO2      Last Pain:  Vitals:   06/17/20 0632  TempSrc: Temporal  PainSc:       Patients Stated Pain Goal: 3 (32/35/57 3220)  Complications: No complications documented.

## 2020-06-17 NOTE — Op Note (Signed)
HEART AND VASCULAR CENTER   MULTIDISCIPLINARY HEART VALVE TEAM   TAVR OPERATIVE NOTE   Date of Procedure:  06/17/2020  Preoperative Diagnosis: Severe Low-Flow, Low-Gradient Aortic Stenosis   Postoperative Diagnosis: Same   Procedure:    Transcatheter Aortic Valve Replacement - Percutaneous Right Transfemoral Approach  Edwards Sapien 3 Ultra THV (size 26 mm, model # 9750TFX, serial # [id number])   Co-Surgeons:  Valentina Gu. Roxy Manns, MD and Lauree Chandler, MD  Anesthesiologist:  Rogue Jury, MD  Echocardiographer:  Sanda Klein, MD  Pre-operative Echo Findings:  Severe aortic stenosis  Severe left ventricular systolic dysfunction  Post-operative Echo Findings:  Mild paravalvular leak  Unchanged left ventricular systolic function   BRIEF CLINICAL NOTE AND INDICATIONS FOR SURGERY  Patient is an 86 year old male with history of coronary artery disease status post acute myocardial infarction 2006 with ischemic cardiomyopathy and chronic systolic congestive heart failure, aortic stenosis, sick sinus syndrome status post permanent pacemaker for symptomatic bradycardia in 2012, and questionable history of cerebellar stroke on long-term anticoagulation using Eliquis who has been referred for surgical consultation to discuss treatment options for management of severe symptomatic aortic stenosis.  Patient's cardiac history dates back to 2006 when he suffered an acute myocardial infarction.  He was treated with multivessel stenting.  He had been followed by Dr. Saralyn Pilar in [place] for some time and underwent permanent pacemaker placement for symptomatic bradycardia in 2012.  In May 2020 the patient was hospitalized with acute shortness of breath, dizziness, atypical chest pain.  Troponin was minimally elevated.  Catheterization revealed stable coronary artery disease with chronic occlusion of the mid left circumflex coronary artery and 85% ostial stenosis of the posterior  descending coronary artery.  Echocardiogram revealed what was felt to be moderate aortic stenosis and severe left ventricular systolic dysfunction with ejection fraction estimated only 20 to 25%.  Symptoms of dizziness and questionable cerebellar signs prompted head CT which was negative for stroke.  MRI was not performed because of the pacemaker.  The patient was transferred to [facility] where he was seen by neurology who thought symptoms might be related to cerebellar stroke and recommended anticoagulation using Eliquis.  The patient's acute heart failure was managed by Dr. Aundra Dubin who has been following him carefully ever since.  Echocardiogram performed October 2020 revealed severe left ventricular systolic dysfunction with ejection fraction estimated 20 to 25%.  The aortic valve appeared trileaflet with restricted leaflet mobility.  Peak velocity across aortic valve measured 2.5 m/s corresponding to mean transvalvular gradient estimated at 14 mmHg.  The DVI was notably quite low at 0.21 with aortic valve area calculated only 0.78 cm by VTI.  Dobutamine stress echocardiogram was performed January 21, 2020.  Ejection fraction was estimated 25 to 30%.  Resting gradient across the aortic valve measured 13 mm at baseline and increased to 34 mm at peak stress with aortic valve area calculated 1.1 cm.  TEE confirmed the presence of severe low-flow low gradient aortic stenosis with mean transvalvular gradient 27 mmHg, DVI 0.24, and stroke-volume index only 21 with aortic valve area calculated 0.56 cm by VTI.  Diagnostic cardiac catheterization revealed stable coronary artery anatomy with known chronic occlusion of left circumflex coronary artery and 80% proximal stenosis of posterior lateral branch off the distal right coronary artery.  There was moderate nonobstructive disease in the left anterior descending coronary artery territory and mild pulmonary hypertension.  Cardiac output was preserved.   The patient was referred to the multidisciplinary heart valve clinic  and has been evaluated previously by Dr. Angelena Form.  CT angiography was performed and the patient referred for surgical consultation.  During the course of the patient's preoperative work up they have been evaluated comprehensively by a multidisciplinary team of specialists coordinated through the [facility] the [facility].  They have been demonstrated to suffer from symptomatic severe aortic stenosis as noted above. The patient has been counseled extensively as to the relative risks and benefits of all options for the treatment of severe aortic stenosis including long term medical therapy, conventional surgery for aortic valve replacement, and transcatheter aortic valve replacement.  All questions have been answered, and the patient provides full informed consent for the operation as described.   DETAILS OF THE OPERATIVE PROCEDURE  PREPARATION:    The patient is brought to the operating room on the above mentioned date and appropriate monitoring was established by the anesthesia team. The patient is placed in the supine position on the operating table.  Intravenous antibiotics are administered. The patient is monitored closely throughout the procedure under conscious sedation.  Baseline transthoracic echocardiogram was performed. The patient's chest, abdomen, both groins, and both lower extremities are prepared and draped in a sterile manner. A time out procedure is performed.   PERIPHERAL ACCESS:    Using the modified Seldinger technique, femoral arterial and venous access was obtained with placement of 6 Fr sheaths on the left side.  A pigtail diagnostic catheter was passed through the left arterial sheath under fluoroscopic guidance into the aortic root.  A temporary transvenous pacemaker catheter was passed through the left femoral venous sheath under fluoroscopic guidance  into the right ventricle.  The pacemaker was tested to ensure stable lead placement and pacemaker capture. Aortic root angiography was performed in order to determine the optimal angiographic angle for valve deployment.   TRANSFEMORAL ACCESS:   Percutaneous transfemoral access and sheath placement was performed using ultrasound guidance.  The right common femoral artery was cannulated using a micropuncture needle and appropriate location was verified using hand injection angiogram.  A pair of Abbott Perclose percutaneous closure devices were placed and a 6 French sheath replaced into the femoral artery.  The patient was heparinized systemically and ACT verified > 250 seconds.    A 14 Fr transfemoral E-sheath was introduced into the right common femoral artery after progressively dilating over an Amplatz superstiff wire. An AL-1 catheter was used to direct a straight-tip exchange length wire across the native aortic valve into the left ventricle. This was exchanged out for a pigtail catheter and position was confirmed in the LV apex. Simultaneous LV and Ao pressures were recorded.  The pigtail catheter was exchanged for an Amplatz Extra-stiff wire in the LV apex.  Echocardiography was utilized to confirm appropriate wire position and no sign of entanglement in the mitral subvalvular apparatus.   TRANSCATHETER HEART VALVE DEPLOYMENT:   An Edwards Sapien 3 Ultra transcatheter heart valve (size 26 mm, model #[id number], serial #[id number]) was prepared and crimped per manufacturer's guidelines, and the proper orientation of the valve is confirmed on the [employer] delivery system. The valve was advanced through the introducer sheath using normal technique until in an appropriate position in the abdominal aorta beyond the sheath tip. The balloon was then retracted and using the fine-tuning wheel was centered on the valve. The valve was then advanced across the aortic arch using appropriate flexion of the  catheter. The valve was carefully positioned across the aortic  valve annulus. The Commander catheter was retracted using normal technique. Once final position of the valve has been confirmed by angiographic assessment, the valve is deployed while temporarily holding ventilation and during rapid ventricular pacing to maintain systolic blood pressure < 50 mmHg and pulse pressure < 10 mmHg. The balloon inflation is held for >3 seconds after reaching full deployment volume. Once the balloon has fully deflated the balloon is retracted into the ascending aorta and valve function is assessed using echocardiography. There is felt to be mild paravalvular leak and no central aortic insufficiency.  The patient's hemodynamic recovery following valve deployment is good.  The deployment balloon and guidewire are both removed.    PROCEDURE COMPLETION:   The sheath was removed and femoral artery closure performed.  Protamine was administered once femoral arterial repair was complete. The temporary pacemaker, pigtail catheters and femoral sheaths were removed with manual pressure used for hemostasis.  A Mynx femoral closure device was utilized following removal of the diagnostic sheath in the left femoral artery.  The patient tolerated the procedure well and is transported to the surgical intensive care in stable condition. There were no immediate intraoperative complications. All sponge instrument and needle counts are verified correct at completion of the operation.   No blood products were administered during the operation.  The patient received a total of 60 mL of intravenous contrast during the procedure.   Rexene Alberts, MD 06/17/2020 9:40 AM

## 2020-06-17 NOTE — Anesthesia Procedure Notes (Signed)
Procedure Name: MAC Date/Time: 06/17/2020 7:40 AM Performed by: Janace Litten, CRNA Pre-anesthesia Checklist: Patient identified, Emergency Drugs available, Suction available and Patient being monitored Patient Re-evaluated:Patient Re-evaluated prior to induction Oxygen Delivery Method: Simple face mask

## 2020-06-17 NOTE — Anesthesia Procedure Notes (Signed)
Arterial Line Insertion Start/End6/29/2021 7:00 AM Performed by: Janace Litten, CRNA, CRNA  Patient location: Pre-op. Preanesthetic checklist: patient identified, IV checked, risks and benefits discussed, surgical consent, monitors and equipment checked and pre-op evaluation Lidocaine 1% used for infiltration Right, radial was placed Catheter size: 20 G Hand hygiene performed  and maximum sterile barriers used  Allen's test indicative of satisfactory collateral circulation Attempts: 1 Procedure performed without using ultrasound guided technique. Following insertion, dressing applied and Biopatch. Post procedure assessment: normal  Patient tolerated the procedure well with no immediate complications. Additional procedure comments: Arterial line placed by Marzetta Merino.

## 2020-06-17 NOTE — Interval H&P Note (Signed)
History and Physical Interval Note:  06/17/2020 7:14 AM  Kristopher Fernandez  has presented today for surgery, with the diagnosis of Severe Aortic Stenosis.  The various methods of treatment have been discussed with the patient and family. After consideration of risks, benefits and other options for treatment, the patient has consented to  Procedure(s): TRANSCATHETER AORTIC VALVE REPLACEMENT, TRANSFEMORAL (N/A) TRANSESOPHAGEAL ECHOCARDIOGRAM (TEE) (N/A) as a surgical intervention.  The patient's history has been reviewed, patient examined, no change in status, stable for surgery.  I have reviewed the patient's chart and labs.  Questions were answered to the patient's satisfaction.     Rexene Alberts

## 2020-06-17 NOTE — Progress Notes (Signed)
  Echocardiogram 2D Echocardiogram has been performed.  Jennette Dubin 06/17/2020, 9:18 AM

## 2020-06-17 NOTE — Progress Notes (Signed)
Levo off

## 2020-06-17 NOTE — Progress Notes (Signed)
Pt arrived to 4e from cath lab. Bilateral groin sites level 0. Vitals obtained. Telemetry box applied and CCMD notified x2 verifiers. SCDs placed. Pt denies needs.

## 2020-06-17 NOTE — CV Procedure (Signed)
HEART AND VASCULAR CENTER  TAVR OPERATIVE NOTE   Date of Procedure:  06/17/2020  Preoperative Diagnosis: Severe Aortic Stenosis   Postoperative Diagnosis: Same   Procedure:    Transcatheter Aortic Valve Replacement - Transfemoral Approach  Edwards Sapien 3 THV (size 25m, model # [id number] serial # [id number]   Co-Surgeons:  CLauree Chandler MD and CValentina Gu ORoxy Manns MD   Anesthesiologist: BFransisco Beau Echocardiographer:     Croitoru  Pre-operative Echo Findings:  Severe aortic stenosis  Severe left ventricular systolic dysfunction  Post-operative Echo Findings:  Trivial paravalvular leak  Moderate left ventricular systolic  dysfunction  BRIEF CLINICAL NOTE AND INDICATIONS FOR SURGERY  86yo male with history of CAD s/p multiple PCIs, ischemic cardiomyopathy, sick sinus syndrome s/p pacemaker, CKD stage 3, prostate cancer, pancreatic mass, HTN, hyperlipidemia and severe aortic stenosis who is here today for TAVR. He has been followed in the advanced heart failure clinic and has been known to have moderate aortic stenosis. Most recent echo May 2921 with LVEF=20-25%. The aortic valve leaflets are thickened with limited leaflet mobility. The mean gradient is 27 mmHg, peak gradient 40.774mg, AVA 0.56 cm2, dimensionless index 0.24. He is known to have CAD with chronic occlusion of the mid Circumflex). Most recent cardiac cath 05/22/20 with stable CAD. Moderate disease in the LAD, CTO of the Circumflex and severe right posterolateral artery stenosis. He has been treated for prostate cancer with radiation therapy. He has recently had trouble tolerating his cardiac medications due to hypotension and orthostasis.   During the course of the patient's preoperative work up they have been evaluated comprehensively by a multidisciplinary team of specialists coordinated through [facility].  They have been demonstrated to  suffer from symptomatic severe aortic stenosis as noted above. The patient has been counseled extensively as to the relative risks and benefits of all options for the treatment of severe aortic stenosis including long term medical therapy, conventional surgery for aortic valve replacement, and transcatheter aortic valve replacement.  The patient has been independently evaluated by Dr. OwRoxy Mannsith CT surgery and they are felt to be at high risk for conventional surgical aortic valve replacement. The surgeon indicated the patient would be a poor candidate for conventional surgery. Based upon review of all of the patient's preoperative diagnostic tests they are felt to be candidate for transcatheter aortic valve replacement using the transfemoral approach as an alternative to high risk conventional surgery.    Following the decision to proceed with transcatheter aortic valve replacement, a discussion has been held regarding what types of management strategies would be attempted intraoperatively in the event of life-threatening complications, including whether or not the patient would be considered a candidate for the use of cardiopulmonary bypass and/or conversion to open sternotomy for attempted surgical intervention.  The patient has been advised of a variety of complications that might develop peculiar to this approach including but not limited to risks of death, stroke, paravalvular leak, aortic dissection or other major vascular complications, aortic annulus rupture, device embolization, cardiac rupture or perforation, acute myocardial infarction, arrhythmia, heart block or bradycardia requiring permanent pacemaker placement, congestive heart failure, respiratory failure, renal failure, pneumonia, infection, other late complications related to structural valve deterioration or migration, or other complications that might ultimately cause a temporary or permanent loss of functional independence or other long term  morbidity.  The patient provides full informed consent for the procedure as described and all questions were  answered preoperatively.    DETAILS OF THE OPERATIVE PROCEDURE  PREPARATION:   The patient is brought to the operating room on the above mentioned date and central monitoring was established by the anesthesia team including placement of a radial arterial line. The patient is placed in the supine position on the operating table.  Intravenous antibiotics are administered. Conscious sedation is used.   Baseline transthoracic echocardiogram was performed. The patient's chest, abdomen, both groins, and both lower extremities are prepared and draped in a sterile manner. A time out procedure is performed.   PERIPHERAL ACCESS:   Using the modified Seldinger technique, femoral arterial and venous access were obtained with placement of 6 Fr sheaths on the left side using u/s guidance.  A pigtail diagnostic catheter was passed through the femoral arterial sheath under fluoroscopic guidance into the aortic root.  A temporary transvenous pacemaker catheter was passed through the femoral venous sheath under fluoroscopic guidance into the right ventricle.  The pacemaker was tested to ensure stable lead placement and pacemaker capture. Aortic root angiography was performed in order to determine the optimal angiographic angle for valve deployment.  TRANSFEMORAL ACCESS:  A micropuncture kit was used to gain access to the right femoral artery. Position confirmed with angiography. Pre-closure with double ProGlide closure devices. The patient was heparinized systemically and ACT verified > 250 seconds.    A 14 Fr transfemoral E-sheath was introduced into the right femoral artery using u/s guidance after progressively dilating over an Amplatz superstiff wire. The pigtail catheter was placed in the aortic root with a J wire and then advanced into the LV. Simultaneous LV and Ao pressures were recorded.  The pigtail  catheter was then exchanged for an Amplatz Extra-stiff wire in the LV apex.   TRANSCATHETER HEART VALVE DEPLOYMENT:  An Edwards Sapien 3 THV (size 26 mm) was prepared and crimped per manufacturer's guidelines, and the proper orientation of the valve is confirmed on the [employer] delivery system. The valve was advanced through the introducer sheath using normal technique until in an appropriate position in the abdominal aorta beyond the sheath tip. The balloon was then retracted and using the fine-tuning wheel was centered on the valve. The valve was then advanced across the aortic arch using appropriate flexion of the catheter. The valve was carefully positioned across the aortic valve annulus. The Commander catheter was retracted using normal technique. Once final position of the valve has been confirmed by angiographic assessment, the valve is deployed while temporarily holding ventilation and during rapid ventricular pacing to maintain systolic blood pressure < 50 mmHg and pulse pressure < 10 mmHg. The balloon inflation is held for >3 seconds after reaching full deployment volume. Once the balloon has fully deflated the balloon is retracted into the ascending aorta and valve function is assessed using TTE. There is felt to be  paravalvular leak and trivial central aortic insufficiency.  The patient's hemodynamic recovery following valve deployment is good.  The deployment balloon and guidewire are both removed. Echo demostrated acceptable post-procedural gradients, stable mitral valve function, and trivial AI.   PROCEDURE COMPLETION:  The sheath was then removed and closure devices were completed. Protamine was administered once femoral arterial repair was complete. The temporary pacemaker, pigtail catheters and femoral sheaths were removed with placement of a Mynx closure device in the right femoral artery and manual pressure was used for venous hemostasis.    The patient tolerated the procedure  well and is transported to the surgical intensive care in  stable condition. There were no immediate intraoperative complications. All sponge instrument and needle counts are verified correct at completion of the operation.   No blood products were administered during the operation.  The patient received a total of 60 mL of intravenous contrast during the procedure.  Lauree Chandler MD 06/17/2020 9:47 AM

## 2020-06-17 NOTE — Progress Notes (Signed)
Mobility Specialist - Progress Note   06/17/20 1637  Mobility  Activity Ambulated in hall  Level of Assistance Modified independent, requires aide device or extra time  Assistive Device Front wheel walker  Distance Ambulated (ft) 300 ft  Mobility Response Tolerated well  Mobility performed by Mobility specialist  $Mobility charge 1 Mobility    Pre-mobility:   Laying: 76 HR, 123/73 BP, 96% SpO2  Sitting: 75 HR, 118/56 BP  Standing: 80 HR, 111/86 BP Post-mobility: 87 HR, 106/92 BP, 98% SpO2   Sapphira Harjo Transport planner Phone: [phone]

## 2020-06-17 NOTE — Anesthesia Postprocedure Evaluation (Signed)
Anesthesia Post Note  Patient: Kristopher Fernandez  Procedure(s) Performed: TRANSCATHETER AORTIC VALVE REPLACEMENT, TRANSFEMORAL (N/A Chest) TRANSESOPHAGEAL ECHOCARDIOGRAM (TEE) (N/A )     Patient location during evaluation: PACU Anesthesia Type: MAC Level of consciousness: awake and alert Pain management: pain level controlled Vital Signs Assessment: post-procedure vital signs reviewed and stable Respiratory status: spontaneous breathing, nonlabored ventilation and respiratory function stable Cardiovascular status: stable and blood pressure returned to baseline Anesthetic complications: no   No complications documented.  Last Vitals:  Vitals:   06/17/20 1025 06/17/20 1035  BP: (!) 100/44 (!) 109/49  Pulse: 60 60  Resp: 15 15  Temp:    SpO2: 99% 98%    Last Pain:  Vitals:   06/17/20 1025  TempSrc:   PainSc: 0-No pain                 Audry Pili

## 2020-06-17 NOTE — Progress Notes (Signed)
  [facility] VALVE TEAM  Patient doing well s/p TAVR. He is hemodynamically stable, but on low dose levophed. Groin sites stable. ECG with paced rhythm. When stable will DC arterial line and transfer to 4E. Plan for early ambulation after bedrest completed and hopeful discharge over the next 24-48 hours.   Angelena Form PA-C  [facility]  Pager [phone]

## 2020-06-17 NOTE — Progress Notes (Signed)
PHARMACY NOTE:  ANTIMICROBIAL RENAL DOSAGE ADJUSTMENT  Current antimicrobial regimen includes a mismatch between antimicrobial dosage and estimated renal function.  As per policy approved by the Pharmacy & Therapeutics and Medical Executive Committees, the antimicrobial dosage will be adjusted accordingly.  Current antimicrobial dosage:  Vancomycin 1000mg  x1  Indication: TAVR ppx  Renal Function:  Estimated Creatinine Clearance: 45.8 mL/min (A) (by C-G formula based on SCr of 1.27 mg/dL (H)).     Antimicrobial dosage has been changed to:  Vancomycin 750mg  24 hours from pre-op dose due to renal function     Thank you for allowing pharmacy to be a part of this patient's care.  Benetta Spar, PharmD, BCPS, BCCP Clinical Pharmacist  Please check AMION for all [place] phone numbers After 10:00 PM, call [place] [phone]

## 2020-06-18 ENCOUNTER — Inpatient Hospital Stay (HOSPITAL_COMMUNITY): Payer: Medicare Other

## 2020-06-18 ENCOUNTER — Encounter (HOSPITAL_COMMUNITY): Payer: Self-pay | Admitting: Cardiovascular Disease

## 2020-06-18 DIAGNOSIS — Z952 Presence of prosthetic heart valve: Secondary | ICD-10-CM

## 2020-06-18 LAB — BASIC METABOLIC PANEL
Anion gap: 8 (ref 5–15)
BUN: 19 mg/dL (ref 8–23)
CO2: 24 mmol/L (ref 22–32)
Calcium: 9.1 mg/dL (ref 8.9–10.3)
Chloride: 103 mmol/L (ref 98–111)
Creatinine, Ser: 1.26 mg/dL — ABNORMAL HIGH (ref 0.61–1.24)
GFR calc Af Amer: 59 mL/min — ABNORMAL LOW (ref >60)
GFR calc non Af Amer: 51 mL/min — ABNORMAL LOW (ref >60)
Glucose, Bld: 128 mg/dL — ABNORMAL HIGH (ref 70–99)
Potassium: 4.4 mmol/L (ref 3.5–5.1)
Sodium: 135 mmol/L (ref 135–145)

## 2020-06-18 LAB — CBC
HCT: 32 % — ABNORMAL LOW (ref 39.0–52.0)
Hemoglobin: 10.6 g/dL — ABNORMAL LOW (ref 13.0–17.0)
MCH: 31.6 pg (ref 26.0–34.0)
MCHC: 33.1 g/dL (ref 30.0–36.0)
MCV: 95.5 fL (ref 80.0–100.0)
Platelets: 93 10*3/uL — ABNORMAL LOW (ref 150–400)
RBC: 3.35 MIL/uL — ABNORMAL LOW (ref 4.22–5.81)
RDW: 13.3 % (ref 11.5–15.5)
WBC: 6.1 10*3/uL (ref 4.0–10.5)
nRBC: 0 % (ref 0.0–0.2)

## 2020-06-18 LAB — ECHOCARDIOGRAM COMPLETE
Height: 72 in
Weight: 2723.12 oz

## 2020-06-18 LAB — MAGNESIUM: Magnesium: 1.7 mg/dL (ref 1.7–2.4)

## 2020-06-18 MED ORDER — SPIRONOLACTONE 12.5 MG HALF TABLET
12.5000 mg | ORAL_TABLET | Freq: Every day | ORAL | Status: DC
  Administered 2020-06-18: 12.5 mg via ORAL
  Filled 2020-06-18: qty 1

## 2020-06-18 MED ORDER — CLOPIDOGREL BISULFATE 75 MG PO TABS: 75.0000 mg | ORAL_TABLET | Freq: Every day | ORAL | 1 refills | Status: AC

## 2020-06-18 MED ORDER — ASPIRIN 81 MG PO CHEW: 81.0000 mg | CHEWABLE_TABLET | Freq: Every day | ORAL | Status: AC

## 2020-06-18 MED ORDER — CLOPIDOGREL BISULFATE 75 MG PO TABS
75.0000 mg | ORAL_TABLET | Freq: Every day | ORAL | Status: DC
  Administered 2020-06-18: 75 mg via ORAL
  Filled 2020-06-18: qty 1

## 2020-06-18 MED FILL — Magnesium Sulfate Inj 50%: INTRAMUSCULAR | Qty: 10 | Status: AC

## 2020-06-18 MED FILL — Potassium Chloride Inj 2 mEq/ML: INTRAVENOUS | Qty: 40 | Status: AC

## 2020-06-18 MED FILL — Heparin Sodium (Porcine) Inj 1000 Unit/ML: INTRAMUSCULAR | Qty: 30 | Status: AC

## 2020-06-18 NOTE — Progress Notes (Signed)
  Echocardiogram 2D Echocardiogram has been performed.  Michiel Cowboy 06/18/2020, 8:47 AM

## 2020-06-18 NOTE — Progress Notes (Addendum)
[facility] TE[facility]  Patient Name: Kristopher Fernandez Date of Encounter: 06/18/2020  Primary Cardiologist: Dr. Aundra Dubin / Dr. Angelena Form & Roxy Manns (TAVR)  Hospital Problem List     Principal Problem:   S/P TAVR (transcatheter aortic valve replacement) Active Problems:   Severe aortic stenosis   HTN (hypertension)   Hyperlipidemia   Chronic anemia   Pacemaker   Thoracic aortic aneurysm (HCC)   CKD (chronic kidney disease) stage 3, GFR 30-59 ml/min   Acute on chronic systolic congestive heart failure (HCC)   Thrombocytopenia (HCC)   Pancreatic mass   Prostate cancer (HCC)   Subjective   Breathing much better. No complaints. Sitting up in chair   Inpatient Medications    Scheduled Meds:  aspirin  81 mg Oral Daily   atorvastatin  80 mg Oral q1800   ranolazine  500 mg Oral BID   sodium chloride flush  3 mL Intravenous Q12H   spironolactone  12.5 mg Oral QPC breakfast   tamsulosin  0.4 mg Oral QPC supper   Continuous Infusions:  sodium chloride     cefUROXime (ZINACEF)  IV Stopped (06/17/20 2049)   nitroGLYCERIN     Vancomycin     PRN Meds: sodium chloride, acetaminophen **OR** acetaminophen, ipratropium-albuterol, morphine injection, ondansetron (ZOFRAN) IV, oxyCODONE, sodium chloride flush, traMADol, venlafaxine XR   Vital Signs    Vitals:   06/17/20 2300 06/17/20 2350 06/18/20 0416 06/18/20 0500  BP: (!) 118/57 118/70 112/69   Pulse: 61 61 75   Resp: 20 17 16    Temp:  98.7 F (37.1 C) 98.1 F (36.7 C)   TempSrc:  Oral Oral   SpO2: 92% 93% 93%   Weight:    77.2 kg  Height:        Intake/Output Summary (Last 24 hours) at 06/18/2020 0756 Last data filed at 06/18/2020 0450 Gross per 24 hour  Intake 1525.22 ml  Output 1675 ml  Net -149.78 ml   Filed Weights   06/17/20 0632 06/18/20 0500  Weight: 84.3 kg 77.2 kg    Physical Exam    GEN: Well nourished, well developed, in no acute distress.  HEENT: Grossly normal.    Neck: Supple, no JVD, carotid bruits, or masses. Cardiac: RRR, no murmurs, rubs, or gallops. No clubbing, cyanosis, edema.   Respiratory:  Respirations regular and unlabored, clear to auscultation bilaterally. GI: Soft, nontender, nondistended, BS + x 4. MS: no deformity or atrophy. Skin: warm and dry, no rash.  Groin sites clear without hematoma or ecchymosis  Neuro:  Strength and sensation are intact. Psych: AAOx3.  Normal affect.  Labs    CBC Recent Labs    06/17/20 0811 06/17/20 0950  HGB 8.8* 7.5*  HCT 26.0* 69.6*   Basic Metabolic Panel Recent Labs    06/17/20 0950 06/18/20 0700  NA 143 135  K 3.5 4.4  CL 105 103  CO2  --  24  GLUCOSE 104* 128*  BUN 14 19  CREATININE 0.90 1.26*  CALCIUM  --  9.1  MG  --  1.7   Liver Function Tests No results for input(s): AST, ALT, ALKPHOS, BILITOT, PROT, ALBUMIN in the last 72 hours. No results for input(s): LIPASE, AMYLASE in the last 72 hours. Cardiac Enzymes No results for input(s): CKTOTAL, CKMB, CKMBINDEX, TROPONINI in the last 72 hours. BNP Invalid input(s): POCBNP D-Dimer No results for input(s): DDIMER in the last 72 hours. Hemoglobin A1C No results for input(s): HGBA1C in  the last 72 hours. Fasting Lipid Panel No results for input(s): CHOL, HDL, LDLCALC, TRIG, CHOLHDL, LDLDIRECT in the last 72 hours. Thyroid Function Tests No results for input(s): TSH, T4TOTAL, T3FREE, THYROIDAB in the last 72 hours.  Invalid input(s): FREET3  Telemetry    paced - Personally Reviewed  ECG    Atrial-paced rhythm with prolonged AV conduction, PVCs - Personally Reviewed  Radiology    ECHOCARDIOGRAM LIMITED  Result Date: 06/17/2020    ECHOCARDIOGRAM LIMITED REPORT   Patient Name:   Kristopher Fernandez Date of Exam: 06/17/2020 Medical Rec #:  [id number]    Height:       72.0 in Accession #:    [id number]   Weight:       185.8 lb Date of Birth:  1932-02-17    BSA:          2.065 m Patient Age:    86 years     BP:           124/56  mmHg Patient Gender: M            HR:           86 bpm. Exam Location:  Inpatient Procedure: Limited Echo, Limited Color Doppler, Cardiac Doppler and Echo            Assisted Procedure Indications:     Aortic Stenosis; TAVR Procedure  History:         Patient has prior history of Echocardiogram examinations, most                  recent 05/14/2020. CHF, Previous Myocardial Infarction,                  Pacemaker, Aortic Valve Disease; Risk Factors:Hypertension and                  Dyslipidemia.                  Aortic Valve: 26 mm Edwards Ultra Sapien, stented (TAVR) valve                  is present in the aortic position. Procedure Date: 06/17/2020.  Sonographer:     Mikki Santee RDCS (AE) Referring Phys:  [place] Diagnosing Phys: Sanda Klein MD                   PRE-PROCEDURAL FINDINGS:                   Severely reduced left ventricular systolic function. Estimated                  LVEF 25-30%.                  Global LV hypokinesis, maybe slightly worse in lateral wall                  (limited views obtained).                  Severe calcific aortic stenosis. Probably trileaflet aortic                  valve.                  Peak aortic valve gradient 32 mm Hg, mean gradient 16 mm Hg.                  Dimensionless obstructive index 0.25, calculated aortic  valve                  area is 0.8 cm.                  Mild aortic insufficiency.                  No mitral insufficiency.                  No pericardial effusion.                   POST-PROCEDURAL FINDINGS:                   There is slight improvement in left ventricular systolic                  function. Estimated LVEF 30-35%.                  There are new no regional wall motion abnormalities.                  Well-seated TAVR stent-valve.                  Peak aortic valve gradient 9 mm Hg, mean gradient 4 mm Hg.                  Dimensionless obstructive index 0.45, calculated aortic valve                  area is  1.86cm.                  There is no aortic insufficiency. There is a trivial-to-mild                  perivalvular leak at the anteromedial aspect of the ring.                  No mitral insufficiency.                  No pericardial effusion. IMPRESSIONS  1. There is a 26 mm Edwards Ultra, stented (TAVR) valve present in the aortic position. Procedure Date: 06/17/2020. FINDINGS  Aortic Valve: Aortic regurgitation PHT measures 677 msec. Aortic valve mean gradient measures 4.0 mmHg. Aortic valve peak gradient measures 9.0 mmHg. Aortic valve area, by VTI measures 1.64 cm. There is a 26 mm Edwards Ultra, stented (TAVR) valve present in the aortic position. Procedure Date: 06/17/2020.  LEFT VENTRICLE PLAX 2D LVOT diam:     2.00 cm LV SV:         52 LV SV Index:   25 LVOT Area:     3.14 cm  AORTIC VALVE AV Area (Vmax):    1.41 cm AV Area (Vmean):   1.51 cm AV Area (VTI):     1.64 cm AV Vmax:           150.00 cm/s AV Vmean:          90.600 cm/s AV VTI:            0.314 m AV Peak Grad:      9.0 mmHg AV Mean Grad:      4.0 mmHg LVOT Vmax:         67.20 cm/s LVOT Vmean:        43.500 cm/s LVOT VTI:          0.164 m LVOT/AV VTI ratio: 0.52 AI PHT:  677 msec  SHUNTS Systemic VTI:  0.16 m Systemic Diam: 2.00 cm Sanda Klein MD Electronically signed by Sanda Klein MD Signature Date/Time: 06/17/2020/1:32:41 PM    Final    Structural Heart Procedure  Result Date: 06/17/2020 See surgical note for result.   Cardiac Studies   TAVR OPERATIVE NOTE     Date of Procedure:                06/17/2020   Preoperative Diagnosis:      Severe Low-Flow, Low-Gradient Aortic Stenosis    Postoperative Diagnosis:    Same    Procedure:        Transcatheter Aortic Valve Replacement - Percutaneous Right Transfemoral Approach             Edwards Sapien 3 Ultra THV (size 26 mm, model # 9750TFX, serial # [id number])              Co-Surgeons:                        Valentina Gu. Roxy Manns, MD and Lauree Chandler, MD    Anesthesiologist:                  Rogue Jury, MD   Echocardiographer:              Sanda Klein, MD   Pre-operative Echo Findings: Severe aortic stenosis Severe left ventricular systolic dysfunction   Post-operative Echo Findings: Mild paravalvular leak Unchanged left ventricular systolic function  _______________  Echo 06/18/20: complete but pending formal read at the time of discharge   Patient Profile     Kristopher Fernandez is a 86 y.o. male with a history of CAD s/p multiple PCIs, ischemic cardiomyopathy, SSS s/p PPM, CKD stage III, prostate cancer, pancreatic mass followed by oncology, chronic systolic CHF, questionable CVA on Eliquis, HTN, HLD and severe LFLG AS who presented to [facility] on 06/17/20 for planned TAVR.   Assessment & Plan    Severe AS: s/p successful TAVR with a 29 mm Edwards Sapien 3 THV via the TF approach on 06/17/20. Post operative echo pending. Groin sites are stable. ECG with paced rhythm. Started on Asprin and plavix. Discontinue home Eliquis (see below). Will follow labs and echo. If stable, potential DC home later today.   Hx of questionable CVA: pt was admitted to in 04/2019 for dizziness. He was seen by neurology who thought symptoms might be related to cerebellar stroke and recommended anticoagulation using Eliquis. MRI was never ordered given PPM. The patient has no hx of afib. Will plan to stop Eliquis at this time and start DAPT with aspirin and plavix.   CAD: pre TAVR cath showed 80% stenosis mid PLV branch off RCA (known from prior cath), known occlusion of LCx after OM1, moderate disease in LAD, 50% proximal and 50-60% distal LAD. Continue medical therapy.   Acute on chronic combined S/D CHF: as evidenced by an elevated BNP on pre admission lab work and progressive symptoms of dyspnea. This has been treated with TAVR. Resume home spiro and PRN lasix  Acute on chronic anemia: istat H/H from yesterday showed Hg down to 7.5. Labs pending from today.   TAA:  pre TAVR CT showed aneurysmal dilatation of the ascending thoracic aorta (4.5 cm in diameter).  Recommend semi-annual imaging followup by CTA or MRA.  SSS s/p PPM: reviewed device interroation done from this AM. No atrial arrhythmias detected. Followed by Dr. Josefa Half. He would like to  switch his care to our practice. I will have him set up with one of our doctors so that we can see all his transmissions   HTN: BP has been well controlled   Signed, Angelena Form, PA-C  06/18/2020, 7:56 AM  Pager [phone]

## 2020-06-18 NOTE — Progress Notes (Signed)
CARDIAC REHAB PHASE I   PRE:  Rate/Rhythm: 76 paced  BP:  Sitting: 133/65      SaO2: 96 RA  MODE:  Ambulation: 400 ft   POST:  Rate/Rhythm: 97 paced  BP:  Sitting: 153/78    SaO2: 96 RA  Pt ambulated 474ft in hallway standby assist with front wheel walker. Pt able to walk and talk throughout the walk. Pt returned to recliner. Pt educated on site care and restrictions. Encouraged continued ambulation with emphasis on safety. Pt anxious for d/c. Declining CRP II at this time.  [id number] Rufina Falco, RN BSN 06/18/2020 9:42 AM

## 2020-06-18 NOTE — Discharge Summary (Addendum)
[place] [facility] VALVE TEAM  Discharge Summary    Patient ID: Kristopher Fernandez MRN: [id number]; DOB: [birth year]  Admit date: 06/17/2020 Discharge date: 06/18/2020  Primary Care Provider: Tracie Harrier, MD  Primary Cardiologist: Dr. Aundra Dubin / Dr. Angelena Form & Dr. Roxy Manns (TAVR)  Discharge Diagnoses    Principal Problem:   S/P TAVR (transcatheter aortic valve replacement) Active Problems:   Severe aortic stenosis   HTN (hypertension)   Hyperlipidemia   Chronic anemia   Pacemaker   Thoracic aortic aneurysm (HCC)   CKD (chronic kidney disease) stage 3, GFR 30-59 ml/min   Acute on chronic systolic congestive heart failure (HCC)   Thrombocytopenia (HCC)   Pancreatic mass   Prostate cancer (HCC)   Allergies No Known Allergies  Diagnostic Studies/Procedures    TAVR OPERATIVE NOTE   Date of Procedure:06/17/2020  Preoperative Diagnosis:SevereLow-Flow, Low-GradientAortic Stenosis   Postoperative Diagnosis:Same   Procedure:   Transcatheter Aortic Valve Replacement - PercutaneousRightTransfemoral Approach Edwards Sapien 3 Ultra THV (size 73mm, model # 9750TFX, serial # [id number])  Co-Surgeons:Clarence H. Roxy Manns, MD and Lauree Chandler, MD  Anesthesiologist:Kevin Fransisco Beau, MD  Echocardiographer:Mihai Croitoru, MD  Pre-operative Echo Findings: ? Severe aortic stenosis ? Severeleft ventricular systolic dysfunction  Post-operative Echo Findings: ? Mildparavalvular leak ? Unchangedleft ventricular systolic function  _______________  Echo 06/18/20: complete but pending formal read at the time of discharge   History of Present Illness     Kristopher Fernandez is a 86 y.o. male with a history of CAD s/p multiple PCIs, ischemic cardiomyopathy, SSS s/p PPM, CKD stage III, prostate cancer, pancreatic mass followed by  oncology, chronic systolic CHF, questionable CVA on Eliquis, HTN, HLD and severe LFLG AS who presented to [facility] on 06/17/20 for planned TAVR.   Patient's cardiac history dates back to 2006 when he suffered an acute myocardial infarction.  He was treated with multivessel stenting.  He had been followed by Dr. Saralyn Pilar in [place] for some time and underwent permanent pacemaker placement for symptomatic bradycardia in 2012.  In May 2020 the patient was hospitalized with acute shortness of breath, dizziness, atypical chest pain.  Troponin was minimally elevated.  Catheterization revealed stable coronary artery disease with chronic occlusion of the mid left circumflex coronary artery and 85% ostial stenosis of the posterior descending coronary artery.  Echocardiogram revealed what was felt to be moderate aortic stenosis and severe left ventricular systolic dysfunction with ejection fraction estimated only 20 to 25%.  Symptoms of dizziness and questionable cerebellar signs prompted head CT which was negative for stroke.  MRI was not performed because of the pacemaker. The patient was transferred to [facility] where he was seen by neurology who thought symptoms might be related to cerebellar stroke and recommended anticoagulation using Eliquis.  The patient's acute heart failure was managed by Dr. Aundra Dubin who has been following him carefully ever since. Echocardiogram performed October 2020 revealed severe left ventricular systolic dysfunction with ejection fraction estimated 20 to 25%.  The aortic valve appeared trileaflet with restricted leaflet mobility.  Peak velocity across aortic valve measured 2.5 m/s corresponding to mean transvalvular gradient estimated at 14 mmHg.  The DVI was notably quite low at 0.21 with aortic valve area calculated only 0.78 cm by VTI.  Dobutamine stress echocardiogram was performed January 21, 2020.  Ejection fraction was estimated 25 to 30%.  Resting gradient across the  aortic valve measured 13 mm at baseline and increased to 34 mm at peak stress with aortic  valve area calculated 1.1 cm.  TEE confirmed the presence of severe low-flow low gradient aortic stenosis with mean transvalvular gradient 27 mmHg, DVI 0.24, and stroke-volume index only 21 with aortic valve area calculated 0.56 cm by VTI.  Diagnostic cardiac catheterization revealed stable coronary artery anatomy with known chronic occlusion of left circumflex coronary artery and 80% proximal stenosis of posterior lateral branch off the distal right coronary artery.  There was moderate nonobstructive disease in the left anterior descending coronary artery territory and mild pulmonary hypertension.  Cardiac output was preserved.    The patient has been evaluated by the multidisciplinary valve team and felt to have severe, symptomatic aortic stenosis and to be a suitable candidate for TAVR, which was set up for 06/17/20.    Hospital Course     Consultants: none  Severe AS:s/p successful TAVR with a 29 mm Edwards Sapien 3 THV via the TF approach on 06/17/20. Post operative echo completed by pending formal read. Per my personal review TAVR looks to be functioning properly with mean gradient of 9 mm Hg and mild PVL. Groin sites are stable. ECG with paced rhythm. Started on Asprin and plavix. Discontinue home Eliquis (see below). Plan for DC home today with close follow up in the office next week.  Hx of questionable CVA: pt was admitted to in 04/2019 for dizziness. He was seen by neurology whothought symptoms might be related to cerebellar stroke and recommended anticoagulation using Eliquis. MRI was never ordered given PPM. The patient has no hx of afib. Will plan to stop Eliquis at this time and start DAPT with aspirin and plavix.   CAD: pre TAVR cath showed 80% stenosis mid PLV branch off RCA (known from prior cath), known occlusion of LCx after OM1, moderate disease in LAD, 50% proximal and 50-60% distal LAD.  Continue medical therapy.   Acute on chronic combined S/D CHF: as evidenced by an elevated BNP on pre admission lab work and progressive symptoms of dyspnea. This has been treated with TAVR. Resume home spiro and PRN lasix  Chronic anemia: hg has remained stable at 10.6 today  CKD stage III: creat has remained stable ~ 1.26  TAA: pre TAVR CT showed aneurysmal dilatation of the ascending thoracic aorta (4.5 cm in diameter).  Recommend semi-annual imaging followup by CTA or MRA.  SSS s/p PPM: reviewed device interroation done from this AM. No atrial arrhythmias detected. Followed by Dr. Josefa Half. He would like to switch his care to our practice. I will have him set up with one of our EP doctors so that we can see all his transmissions   HTN: BP has been well controlled  _____________  Discharge Vitals Blood pressure 112/69, pulse 75, temperature 98.1 F (36.7 C), temperature source Oral, resp. rate 16, height 6' (1.829 m), weight 77.2 kg, SpO2 93 %.  Filed Weights   06/17/20 0632 06/18/20 0500  Weight: 84.3 kg 77.2 kg    Labs & Radiologic Studies    CBC Recent Labs    06/17/20 0950 06/18/20 0700  WBC  --  6.1  HGB 7.5* 10.6*  HCT 22.0* 32.0*  MCV  --  95.5  PLT  --  93*   Basic Metabolic Panel Recent Labs    06/17/20 0950 06/18/20 0700  NA 143 135  K 3.5 4.4  CL 105 103  CO2  --  24  GLUCOSE 104* 128*  BUN 14 19  CREATININE 0.90 1.26*  CALCIUM  --  9.1  MG  --  1.7   Liver Function Tests No results for input(s): AST, ALT, ALKPHOS, BILITOT, PROT, ALBUMIN in the last 72 hours. No results for input(s): LIPASE, AMYLASE in the last 72 hours. Cardiac Enzymes No results for input(s): CKTOTAL, CKMB, CKMBINDEX, TROPONINI in the last 72 hours. BNP Invalid input(s): POCBNP D-Dimer No results for input(s): DDIMER in the last 72 hours. Hemoglobin A1C No results for input(s): HGBA1C in the last 72 hours. Fasting Lipid Panel No results for input(s): CHOL, HDL,  LDLCALC, TRIG, CHOLHDL, LDLDIRECT in the last 72 hours. Thyroid Function Tests No results for input(s): TSH, T4TOTAL, T3FREE, THYROIDAB in the last 72 hours.  Invalid input(s): FREET3 _____________  DG Chest 2 View  Result Date: 06/14/2020 CLINICAL DATA:  Preop aortic stenosis EXAM: CHEST - 2 VIEW COMPARISON:  12/11/2019 FINDINGS: Left pacer remains in place, unchanged. Lungs are clear. Heart is normal size. No effusions or acute bony abnormality. IMPRESSION: No acute cardiopulmonary disease. Electronically Signed   By: Rolm Baptise M.D.   On: 06/14/2020 20:58   CARDIAC CATHETERIZATION  Result Date: 05/22/2020 1. Preserved cardiac output. 2. Low filling pressures. 3. Mild pulmonary hypertension. 4. 80% stenosis mid PLV branch off RCA (known from prior cath). 5. Known occlusion of LCx after OM1. 6. Moderate disease in LAD, 50% proximal and 50-60% distal.  7. Known severe AS from echo and TEE, valve not crossed. Referred for TAVR evaluation.   CT CORONARY MORPH W/CTA COR W/SCORE W/CA W/CM &/OR WO/CM  Addendum Date: 06/01/2020   ADDENDUM REPORT: 06/01/2020 21:44 CLINICAL DATA:  Aortic stenosis EXAM: Cardiac TAVR CT TECHNIQUE: The patient was scanned on a Siemens Force 952 slice scanner. A 120 kV retrospective scan was triggered in the descending thoracic aorta at 111 HU's. Gantry rotation speed was 270 msecs and collimation was .9 mm. No beta blockade or nitro were given. The 3D data set was reconstructed in 5% intervals of the R-R cycle. Systolic and diastolic phases were analyzed on a dedicated work station using MPR, MIP and VRT modes. The patient received 164mL OMNIPAQUE IOHEXOL 350 MG/ML SOLN of contrast. FINDINGS: Significant motion artifact impacts diagnostic accuracy of this exam. Aortic Valve: Tricuspid aortic valve. Severely reduced cusp separation. Moderately thickened, severely calcified aortic valve cusps. AV calcium score: 2742. Quantitation impacted by metallic artifact from device leads  and coronary stent. To mitigate effects of motion artifact, measurements made at 40% R-R interval. Virtual Basal Annulus Measurements: Maximum/Minimum Diameter: 30 x 23.2 mm Perimeter: 82.8 mm Area: 523 mm2 No significant LVOT calcifications. Based on these measurements, the annulus would be suitable for a 26 mm Sapien 3 valve. Sinus of Valsalva Measurements: Non-coronary:  34 mm Right - coronary:  33 mm Left - coronary:  34 mm Sinus of Valsalva Height: Left: 21 mm Right: 23.6 mm Aorta: Severe mixed atherosclerotic plaque in the entire thoracic aorta, most prominently in the ascending aorta. Sinotubular Junction:  30 mm Ascending Thoracic Aorta: 37 mm at the mid level, 39 mm at the distal ascending aorta. Aortic Arch:  33 mm Descending Thoracic Aorta:  35 mm Coronary Artery Height above Annulus: Left Main: 15 mm Right Coronary: 16.5 mm to the orifice of the ostial RCA stent which protrudes into the aorta. Coronary Arteries: 3 vessel coronary artery disease with coronary stent in RCA, crossing the ostium into the aorta. Optimum Fluoroscopic Angle for Delivery: LAO 24, CAU 22 Dual chamber permanent pacemaker with left chest wall generator. IMPRESSION: Significant motion artifact at the annulus may impact diagnostic accuracy of  measurements for TAVR. 1. Tricuspid aortic valve. Severely reduced cusp separation. Moderately thickened, severely calcified aortic valve cusps. 2. AV calcium score: 2742. 3. Annulus area 523 mm, suitable for a 26 mm Sapien 3 valve. 4. Adequate coronary artery heights from annulus, however RCA stent crosses ostium and protrudes into aorta. 5. No left atrial appendage thrombus. 6. Optimum Fluoroscopic Angle for Delivery: LAO 24, CAU 22 7. Severe mixed atherosclerotic plaque in the thoracic aorta. Electronically Signed   By: Cherlynn Kaiser   On: 06/01/2020 21:44   Result Date: 06/01/2020 EXAM: OVER-READ INTERPRETATION  CT CHEST The following report is an over-read performed by radiologist Dr.  Vinnie Langton of [facility], [place] on 05/30/2020. This over-read does not include interpretation of cardiac or coronary anatomy or pathology. The coronary calcium score/coronary CTA interpretation by the cardiologist is attached. COMPARISON:  Chest CTA 12/27/2018. FINDINGS: Extracardiac findings will be described separately under dictation for contemporaneously obtained CTA chest, abdomen and pelvis. IMPRESSION: Please see separate dictation for contemporaneously obtained CTA chest, abdomen and pelvis dated 05/30/2020 for full description of relevant extracardiac findings. Electronically Signed: By: Vinnie Langton M.D. On: 05/30/2020 14:08   CT ANGIO CHEST AORTA W/CM & OR WO/CM  Result Date: 05/30/2020 CLINICAL DATA:  86 year old male with history of severe aortic stenosis. Preprocedural study prior to potential transcatheter aortic valve replacement (TAVR) procedure. EXAM: CT ANGIOGRAPHY CHEST, ABDOMEN AND PELVIS TECHNIQUE: Non-contrast CT of the chest was initially obtained. Multidetector CT imaging through the chest, abdomen and pelvis was performed using the standard protocol during bolus administration of intravenous contrast. Multiplanar reconstructed images and MIPs were obtained and reviewed to evaluate the vascular anatomy. CONTRAST:  170mL OMNIPAQUE IOHEXOL 350 MG/ML SOLN COMPARISON:  PET-CT 02/21/2020. CT the abdomen and pelvis 01/09/2020. Chest CT 12/21/2017. Chest CTA 10/08/2016. FINDINGS: CTA CHEST FINDINGS Cardiovascular: Heart size is mildly enlarged. There is no significant pericardial fluid, thickening or pericardial calcification. There is aortic atherosclerosis, as well as atherosclerosis of the great vessels of the mediastinum and the coronary arteries, including calcified atherosclerotic plaque in the left main, left anterior descending, left circumflex and right coronary arteries. Severe thickening calcification of the aortic valve. Aneurysmal dilatation of the ascending thoracic  aorta (4.5 cm in diameter). Mediastinum/Lymph Nodes: No pathologically enlarged mediastinal or hilar lymph nodes. Esophagus is unremarkable in appearance. No axillary lymphadenopathy. Lungs/Pleura: No suspicious appearing pulmonary nodules or masses are noted. A few scattered tiny calcified granulomas are incidentally noted. No acute consolidative airspace disease. No pleural effusions. Musculoskeletal/Soft Tissues: There are no aggressive appearing lytic or blastic lesions noted in the visualized portions of the skeleton. CTA ABDOMEN AND PELVIS FINDINGS Hepatobiliary: No suspicious cystic or solid hepatic lesions. No intra or extrahepatic biliary ductal dilatation. Large calcified gallstone measuring 2.1 cm in diameter. Gallbladder is otherwise unremarkable in appearance. Pancreas: In the head of the pancreas there is a well-defined 2.3 x 1.5 x 1.9 cm intermediate attenuation (40 HU) lesion (axial image 111 of series 18 and coronal image 91 of series 20), similar to the prior examination from 01/09/2020. No other new pancreatic mass. No pancreatic ductal dilatation. No peripancreatic fluid collections or inflammatory changes. Spleen: Unremarkable. Adrenals/Urinary Tract: Low-attenuation nonenhancing lesions in both kidneys compatible with simple cysts, largest of which is in the medial aspect of the upper pole the left kidney measuring 4.1 cm in diameter. Other subcentimeter low-attenuation lesions in both kidneys, too small to characterize, but statistically likely to represent cysts. In addition, in the lower pole of the right  kidney there is a 2.7 cm low-attenuation lesion which has a small focus of calcification along the superior/posterior wall (axial image 111 of series 18), stable compared to the prior study (Bosniak class 2 cysts). 9 mm calcification in the distal third of the right ureter shortly before the right ureterovesicular junction (axial image 181 of series 18). No hydroureteronephrosis. Urinary  bladder is normal in appearance. Bilateral adrenal glands are normal in appearance. Stomach/Bowel: The appearance of the stomach is normal. No pathologic dilatation of small bowel or colon. Numerous colonic diverticulae are noted, without surrounding inflammatory changes to suggest an acute diverticulitis at this time. Normal appendix. Vascular/Lymphatic: Aortic atherosclerosis, without evidence of aneurysm or dissection in the abdominal or pelvic vasculature. No lymphadenopathy noted in the abdomen or pelvis. Reproductive: Prostate gland and seminal vesicles are grossly unremarkable in appearance. Other: No significant volume of ascites.  No pneumoperitoneum. Musculoskeletal: There are no aggressive appearing lytic or blastic lesions noted in the visualized portions of the skeleton. VASCULAR MEASUREMENTS PERTINENT TO TAVR: AORTA: Minimal Aortic Diameter-13 x 11 mm Severity of Aortic Calcification-moderate RIGHT PELVIS: Right Common Iliac Artery - Minimal Diameter-9.8 x 8.7 mm Tortuosity-mild Calcification-mild Right External Iliac Artery - Minimal Diameter-6.4 x 6.4 mm Tortuosity-moderate Calcification-minimal Right Common Femoral Artery - Minimal Diameter-6.2 x 6.6 mm Tortuosity-mild Calcification-mild LEFT PELVIS: Left Common Iliac Artery - Minimal Diameter-7.2 x 5.9 mm Tortuosity-mild Calcification-mild Left External Iliac Artery - Minimal Diameter-6.8 x 6.0 mm Tortuosity-moderate Calcification-mild Left Common Femoral Artery - Minimal Diameter-7.4 x 5.0 mm Tortuosity-mild Calcification-mild Review of the MIP images confirms the above findings. IMPRESSION: 1. Vascular findings and measurements pertinent to potential TAVR procedure, as detailed above. 2. Severe thickening calcification of the aortic valve, compatible with the reported clinical history of severe aortic stenosis. 3. Aortic atherosclerosis, in addition to left main and 3 vessel coronary artery disease. In addition, there is aneurysmal dilatation of  the ascending thoracic aorta (4.5 cm in diameter). Ascending thoracic aortic aneurysm. Recommend semi-annual imaging followup by CTA or MRA and referral to cardiothoracic surgery if not already obtained. This recommendation follows 2010 ACCF/AHA/AATS/ACR/ASA/SCA/SCAI/SIR/STS/SVM Guidelines for the Diagnosis and Management of Patients With Thoracic Aortic Disease. Circulation. 2010; 121: C947-S962. Aortic aneurysm NOS (ICD10-I71.9). 4. Mild cardiomegaly. 5. Cholelithiasis without evidence of acute cholecystitis at this time. 6. Stable low-attenuation lesion in the head of the pancreas which remains incompletely characterized on today's examination. Given the lack of hypermetabolism on the prior PET-CT, this may represent a benign lesion, but further characterization with nonemergent abdominal MRI with and without IV gadolinium with MRCP is suggested to definitively characterize this lesion. 7. Colonic diverticulosis without evidence of acute diverticulitis at this time. 8. Additional incidental findings, as above. Electronically Signed   By: Vinnie Langton M.D.   On: 05/30/2020 14:46   ECHOCARDIOGRAM LIMITED  Result Date: 06/17/2020    ECHOCARDIOGRAM LIMITED REPORT   Patient Name:   Kristopher Fernandez Date of Exam: 06/17/2020 Medical Rec #:  [id number]    Height:       72.0 in Accession #:    [id number]   Weight:       185.8 lb Date of Birth:  08/19/1932    BSA:          2.065 m Patient Age:    86 years     BP:           124/56 mmHg Patient Gender: M            HR:  86 bpm. Exam Location:  Inpatient Procedure: Limited Echo, Limited Color Doppler, Cardiac Doppler and Echo            Assisted Procedure Indications:     Aortic Stenosis; TAVR Procedure  History:         Patient has prior history of Echocardiogram examinations, most                  recent 05/14/2020. CHF, Previous Myocardial Infarction,                  Pacemaker, Aortic Valve Disease; Risk Factors:Hypertension and                  Dyslipidemia.                   Aortic Valve: 26 mm Edwards Ultra Sapien, stented (TAVR) valve                  is present in the aortic position. Procedure Date: 06/17/2020.  Sonographer:     Mikki Santee RDCS (AE) Referring Phys:  [place] Diagnosing Phys: Sanda Klein MD                   PRE-PROCEDURAL FINDINGS:                   Severely reduced left ventricular systolic function. Estimated                  LVEF 25-30%.                  Global LV hypokinesis, maybe slightly worse in lateral wall                  (limited views obtained).                  Severe calcific aortic stenosis. Probably trileaflet aortic                  valve.                  Peak aortic valve gradient 32 mm Hg, mean gradient 16 mm Hg.                  Dimensionless obstructive index 0.25, calculated aortic valve                  area is 0.8 cm.                  Mild aortic insufficiency.                  No mitral insufficiency.                  No pericardial effusion.                   POST-PROCEDURAL FINDINGS:                   There is slight improvement in left ventricular systolic                  function. Estimated LVEF 30-35%.                  There are new no regional wall motion abnormalities.                  Well-seated TAVR stent-valve.  Peak aortic valve gradient 9 mm Hg, mean gradient 4 mm Hg.                  Dimensionless obstructive index 0.45, calculated aortic valve                  area is 1.86cm.                  There is no aortic insufficiency. There is a trivial-to-mild                  perivalvular leak at the anteromedial aspect of the ring.                  No mitral insufficiency.                  No pericardial effusion. IMPRESSIONS  1. There is a 26 mm Edwards Ultra, stented (TAVR) valve present in the aortic position. Procedure Date: 06/17/2020. FINDINGS  Aortic Valve: Aortic regurgitation PHT measures 677 msec. Aortic valve mean gradient measures 4.0 mmHg. Aortic valve peak  gradient measures 9.0 mmHg. Aortic valve area, by VTI measures 1.64 cm. There is a 26 mm Edwards Ultra, stented (TAVR) valve present in the aortic position. Procedure Date: 06/17/2020.  LEFT VENTRICLE PLAX 2D LVOT diam:     2.00 cm LV SV:         52 LV SV Index:   25 LVOT Area:     3.14 cm  AORTIC VALVE AV Area (Vmax):    1.41 cm AV Area (Vmean):   1.51 cm AV Area (VTI):     1.64 cm AV Vmax:           150.00 cm/s AV Vmean:          90.600 cm/s AV VTI:            0.314 m AV Peak Grad:      9.0 mmHg AV Mean Grad:      4.0 mmHg LVOT Vmax:         67.20 cm/s LVOT Vmean:        43.500 cm/s LVOT VTI:          0.164 m LVOT/AV VTI ratio: 0.52 AI PHT:            677 msec  SHUNTS Systemic VTI:  0.16 m Systemic Diam: 2.00 cm Sanda Klein MD Electronically signed by Sanda Klein MD Signature Date/Time: 06/17/2020/1:32:41 PM    Final    Structural Heart Procedure  Result Date: 06/17/2020 See surgical note for result.  CT Angio Abd/Pel w/ and/or w/o  Result Date: 05/30/2020 CLINICAL DATA:  86 year old male with history of severe aortic stenosis. Preprocedural study prior to potential transcatheter aortic valve replacement (TAVR) procedure. EXAM: CT ANGIOGRAPHY CHEST, ABDOMEN AND PELVIS TECHNIQUE: Non-contrast CT of the chest was initially obtained. Multidetector CT imaging through the chest, abdomen and pelvis was performed using the standard protocol during bolus administration of intravenous contrast. Multiplanar reconstructed images and MIPs were obtained and reviewed to evaluate the vascular anatomy. CONTRAST:  155mL OMNIPAQUE IOHEXOL 350 MG/ML SOLN COMPARISON:  PET-CT 02/21/2020. CT the abdomen and pelvis 01/09/2020. Chest CT 12/21/2017. Chest CTA 10/08/2016. FINDINGS: CTA CHEST FINDINGS Cardiovascular: Heart size is mildly enlarged. There is no significant pericardial fluid, thickening or pericardial calcification. There is aortic atherosclerosis, as well as atherosclerosis of the great vessels of the  mediastinum and the coronary arteries, including calcified atherosclerotic plaque in the left  main, left anterior descending, left circumflex and right coronary arteries. Severe thickening calcification of the aortic valve. Aneurysmal dilatation of the ascending thoracic aorta (4.5 cm in diameter). Mediastinum/Lymph Nodes: No pathologically enlarged mediastinal or hilar lymph nodes. Esophagus is unremarkable in appearance. No axillary lymphadenopathy. Lungs/Pleura: No suspicious appearing pulmonary nodules or masses are noted. A few scattered tiny calcified granulomas are incidentally noted. No acute consolidative airspace disease. No pleural effusions. Musculoskeletal/Soft Tissues: There are no aggressive appearing lytic or blastic lesions noted in the visualized portions of the skeleton. CTA ABDOMEN AND PELVIS FINDINGS Hepatobiliary: No suspicious cystic or solid hepatic lesions. No intra or extrahepatic biliary ductal dilatation. Large calcified gallstone measuring 2.1 cm in diameter. Gallbladder is otherwise unremarkable in appearance. Pancreas: In the head of the pancreas there is a well-defined 2.3 x 1.5 x 1.9 cm intermediate attenuation (40 HU) lesion (axial image 111 of series 18 and coronal image 91 of series 20), similar to the prior examination from 01/09/2020. No other new pancreatic mass. No pancreatic ductal dilatation. No peripancreatic fluid collections or inflammatory changes. Spleen: Unremarkable. Adrenals/Urinary Tract: Low-attenuation nonenhancing lesions in both kidneys compatible with simple cysts, largest of which is in the medial aspect of the upper pole the left kidney measuring 4.1 cm in diameter. Other subcentimeter low-attenuation lesions in both kidneys, too small to characterize, but statistically likely to represent cysts. In addition, in the lower pole of the right kidney there is a 2.7 cm low-attenuation lesion which has a small focus of calcification along the superior/posterior  wall (axial image 111 of series 18), stable compared to the prior study (Bosniak class 2 cysts). 9 mm calcification in the distal third of the right ureter shortly before the right ureterovesicular junction (axial image 181 of series 18). No hydroureteronephrosis. Urinary bladder is normal in appearance. Bilateral adrenal glands are normal in appearance. Stomach/Bowel: The appearance of the stomach is normal. No pathologic dilatation of small bowel or colon. Numerous colonic diverticulae are noted, without surrounding inflammatory changes to suggest an acute diverticulitis at this time. Normal appendix. Vascular/Lymphatic: Aortic atherosclerosis, without evidence of aneurysm or dissection in the abdominal or pelvic vasculature. No lymphadenopathy noted in the abdomen or pelvis. Reproductive: Prostate gland and seminal vesicles are grossly unremarkable in appearance. Other: No significant volume of ascites.  No pneumoperitoneum. Musculoskeletal: There are no aggressive appearing lytic or blastic lesions noted in the visualized portions of the skeleton. VASCULAR MEASUREMENTS PERTINENT TO TAVR: AORTA: Minimal Aortic Diameter-13 x 11 mm Severity of Aortic Calcification-moderate RIGHT PELVIS: Right Common Iliac Artery - Minimal Diameter-9.8 x 8.7 mm Tortuosity-mild Calcification-mild Right External Iliac Artery - Minimal Diameter-6.4 x 6.4 mm Tortuosity-moderate Calcification-minimal Right Common Femoral Artery - Minimal Diameter-6.2 x 6.6 mm Tortuosity-mild Calcification-mild LEFT PELVIS: Left Common Iliac Artery - Minimal Diameter-7.2 x 5.9 mm Tortuosity-mild Calcification-mild Left External Iliac Artery - Minimal Diameter-6.8 x 6.0 mm Tortuosity-moderate Calcification-mild Left Common Femoral Artery - Minimal Diameter-7.4 x 5.0 mm Tortuosity-mild Calcification-mild Review of the MIP images confirms the above findings. IMPRESSION: 1. Vascular findings and measurements pertinent to potential TAVR procedure, as detailed  above. 2. Severe thickening calcification of the aortic valve, compatible with the reported clinical history of severe aortic stenosis. 3. Aortic atherosclerosis, in addition to left main and 3 vessel coronary artery disease. In addition, there is aneurysmal dilatation of the ascending thoracic aorta (4.5 cm in diameter). Ascending thoracic aortic aneurysm. Recommend semi-annual imaging followup by CTA or MRA and referral to cardiothoracic surgery if not already obtained. This recommendation  follows 2010 ACCF/AHA/AATS/ACR/ASA/SCA/SCAI/SIR/STS/SVM Guidelines for the Diagnosis and Management of Patients With Thoracic Aortic Disease. Circulation. 2010; 121: S010-X323. Aortic aneurysm NOS (ICD10-I71.9). 4. Mild cardiomegaly. 5. Cholelithiasis without evidence of acute cholecystitis at this time. 6. Stable low-attenuation lesion in the head of the pancreas which remains incompletely characterized on today's examination. Given the lack of hypermetabolism on the prior PET-CT, this may represent a benign lesion, but further characterization with nonemergent abdominal MRI with and without IV gadolinium with MRCP is suggested to definitively characterize this lesion. 7. Colonic diverticulosis without evidence of acute diverticulitis at this time. 8. Additional incidental findings, as above. Electronically Signed   By: Vinnie Langton M.D.   On: 05/30/2020 14:46   Disposition   Pt is being discharged home today in good condition.  Follow-up Plans & Appointments     Follow-up Information    Eileen Stanford, PA-C. Go on 06/25/2020.   Specialties: Cardiology, Radiology Why: @ 2:30pm, please arrive at least 10 minutes early.  Contact information: [address] [place] [address] [phone]                Discharge Medications   Allergies as of 06/18/2020   No Known Allergies     Medication List    STOP taking these medications   apixaban 5 MG Tabs tablet Commonly known as:  ELIQUIS     TAKE these medications   acetaminophen 500 MG tablet Commonly known as: TYLENOL Take 1,000 mg by mouth every 6 (six) hours as needed for mild pain or moderate pain.   albuterol 108 (90 Base) MCG/ACT inhaler Commonly known as: VENTOLIN HFA Inhale 1-2 puffs into the lungs every 6 (six) hours as needed for wheezing or shortness of breath.   aspirin 81 MG chewable tablet Chew 1 tablet (81 mg total) by mouth daily. Start taking on: June 19, 2020   atorvastatin 80 MG tablet Commonly known as: LIPITOR Take 80 mg by mouth daily at 6 PM.   carvedilol 6.25 MG tablet Commonly known as: COREG Take 1 tablet (6.25 mg total) by mouth 2 (two) times daily with a meal.   clopidogrel 75 MG tablet Commonly known as: PLAVIX Take 1 tablet (75 mg total) by mouth daily. Start taking on: June 19, 2020   furosemide 20 MG tablet Commonly known as: Lasix Take 1 tablet (20 mg total) by mouth as needed (weight. Take one tablet if weight increases by 3 pounds overnight or 5 pounds in one week.). What changed:   when to take this  reasons to take this   ipratropium-albuterol 0.5-2.5 (3) MG/3ML Soln Commonly known as: DUONEB Take 3 mLs by nebulization every 6 (six) hours as needed. What changed: reasons to take this   ranolazine 500 MG 12 hr tablet Commonly known as: RANEXA Take 500 mg by mouth 2 (two) times daily.   spironolactone 25 MG tablet Commonly known as: ALDACTONE Take 0.5 tablets (12.5 mg total) by mouth at bedtime. What changed: when to take this   tamsulosin 0.4 MG Caps capsule Commonly known as: FLOMAX Take 1 capsule (0.4 mg total) by mouth daily after supper.   venlafaxine XR 75 MG 24 hr capsule Commonly known as: EFFEXOR-XR Take 1 capsule (75 mg total) by mouth daily with breakfast. What changed:   when to take this  reasons to take this           Outstanding Labs/Studies   none  Duration of Discharge Encounter   Greater than  30 minutes including  physician time.  Mable Fill, PA-C 06/18/2020, 10:37 AM [phone]

## 2020-06-18 NOTE — Discharge Instructions (Addendum)
STOP TAKING Apixaban (Eliquis). Start baby aspirin and clopidogrel (Plavix) instead -Ok to keep the Apixaban (Eliquis) bottle to the side for now in case it is needed later   ================================================================================ ACTIVITY AND EXERCISE . Daily activity and exercise are an important part of your recovery. People recover at different rates depending on their general health and type of valve procedure. . Most people recovering from TAVR feel better relatively quickly  . No lifting, pushing, pulling more than 10 pounds (examples to avoid: groceries, vacuuming, gardening, golfing):             - For one week with a procedure through the groin.             - For six weeks for procedures through the chest wall or neck. NOTE: You will typically see one of our providers 7-14 days after your procedure to discuss [place] the above activities.      DRIVING . Do not drive until you are seen for follow up and cleared by a provider. Generally, we ask patient to not drive for 1 week after their procedure. . If you have been told by your doctor in the past that you may not drive, you must talk with him/her before you begin driving again.   DRESSING . Groin site: you may leave the clear dressing over the site for up to one week or until it falls off.   HYGIENE . If you had a femoral (leg) procedure, you may take a shower when you return home. After the shower, pat the site dry. Do NOT use powder, oils or lotions in your groin area until the site has completely healed. . If you had a chest procedure, you may shower when you return home unless specifically instructed not to by your discharging practitioner.             - DO NOT scrub incision; pat dry with a towel.             - DO NOT apply any lotions, oils, powders to the incision.             - No tub baths / swimming for at least 2 weeks. . If you notice any fevers, chills, increased pain, swelling,  bleeding or pus, please contact your doctor.   ADDITIONAL INFORMATION . If you are going to have an upcoming dental procedure, please contact our office as you will require antibiotics ahead of time to prevent infection on your heart valve.    If you have any questions or concerns you can call the structural heart phone during normal business hours 8am-4pm. If you have an urgent need after hours or weekends please call [phone] to talk to the on call provider for general cardiology. If you have an emergency that requires immediate attention, please call 911.    After TAVR Checklist  Check  Test Description   Follow up appointment in 1-2 weeks  You will see our structural heart physician assistant, Nell Range. Your incision sites will be checked and you will be cleared to drive and resume all normal activities if you are doing well.     1 month echo and follow up  You will have an echo to check on your new heart valve and be seen back in the office by Nell Range. Many times the echo is not read by your appointment time, but Joellen Jersey will call you later that day or the following day to report your  results.   Follow up with your primary cardiologist You will need to be seen by your primary cardiologist in the following 3-6 months after your 1 month appointment in the valve clinic. Often times your Plavix or Aspirin will be discontinued during this time, but this is decided on a case by case basis.    1 year echo and follow up You will have another echo to check on your heart valve after 1 year and be seen back in the office by Nell Range. This your last structural heart visit.   Bacterial endocarditis prophylaxis  You will have to take antibiotics for the rest of your life before all dental procedures (even teeth cleanings) to protect your heart valve. Antibiotics are also required before some surgeries. Please check with your cardiologist before scheduling any surgeries. Also, please make  sure to tell us if you have a penicillin allergy as you will require an alternative antibiotic.

## 2020-06-19 ENCOUNTER — Telehealth: Payer: Self-pay

## 2020-06-19 NOTE — Telephone Encounter (Signed)
Daughter, [name], contacted regarding discharge from [facility] on 06/18/2020.  Patient understands to follow up with provider Nell Range PA-C on 06/25/2020 at 2:30 PM at [facility] office. Patient understands discharge instructions? yes Patient understands medications and regiment? yes Patient understands to bring all medications to this visit? yes

## 2020-06-25 ENCOUNTER — Ambulatory Visit: Payer: Medicare Other | Admitting: Physician Assistant

## 2020-07-04 ENCOUNTER — Other Ambulatory Visit (HOSPITAL_COMMUNITY): Payer: Medicare Other

## 2020-07-11 ENCOUNTER — Ambulatory Visit (HOSPITAL_COMMUNITY)
Admission: RE | Admit: 2020-07-11 | Discharge: 2020-07-11 | Disposition: A | Discharge status: Home / Self Care | Payer: Medicare Other | Source: Ambulatory Visit | Attending: Cardiology | Admitting: Cardiology

## 2020-07-11 ENCOUNTER — Other Ambulatory Visit: Payer: Self-pay

## 2020-07-11 ENCOUNTER — Ambulatory Visit (HOSPITAL_BASED_OUTPATIENT_CLINIC_OR_DEPARTMENT_OTHER)
Admit: 2020-07-11 | Discharge: 2020-07-11 | Disposition: A | Discharge status: Home / Self Care | Payer: Medicare Other | Attending: Cardiology | Admitting: Cardiology

## 2020-07-11 ENCOUNTER — Encounter (HOSPITAL_COMMUNITY): Payer: Self-pay | Admitting: Cardiology

## 2020-07-11 VITALS — BP 108/58 | HR 64 | Wt 182.0 lb

## 2020-07-11 DIAGNOSIS — I495 Sick sinus syndrome: Secondary | ICD-10-CM | POA: Diagnosis not present

## 2020-07-11 DIAGNOSIS — N183 Chronic kidney disease, stage 3 unspecified: Secondary | ICD-10-CM | POA: Insufficient documentation

## 2020-07-11 DIAGNOSIS — Z950 Presence of cardiac pacemaker: Secondary | ICD-10-CM | POA: Diagnosis not present

## 2020-07-11 DIAGNOSIS — Z7982 Long term (current) use of aspirin: Secondary | ICD-10-CM | POA: Diagnosis not present

## 2020-07-11 DIAGNOSIS — Z79899 Other long term (current) drug therapy: Secondary | ICD-10-CM | POA: Insufficient documentation

## 2020-07-11 DIAGNOSIS — Z953 Presence of xenogenic heart valve: Secondary | ICD-10-CM | POA: Diagnosis not present

## 2020-07-11 DIAGNOSIS — Z87891 Personal history of nicotine dependence: Secondary | ICD-10-CM | POA: Diagnosis not present

## 2020-07-11 DIAGNOSIS — Z923 Personal history of irradiation: Secondary | ICD-10-CM | POA: Insufficient documentation

## 2020-07-11 DIAGNOSIS — I252 Old myocardial infarction: Secondary | ICD-10-CM | POA: Insufficient documentation

## 2020-07-11 DIAGNOSIS — J449 Chronic obstructive pulmonary disease, unspecified: Secondary | ICD-10-CM | POA: Insufficient documentation

## 2020-07-11 DIAGNOSIS — F329 Major depressive disorder, single episode, unspecified: Secondary | ICD-10-CM | POA: Insufficient documentation

## 2020-07-11 DIAGNOSIS — E785 Hyperlipidemia, unspecified: Secondary | ICD-10-CM | POA: Diagnosis not present

## 2020-07-11 DIAGNOSIS — I2510 Atherosclerotic heart disease of native coronary artery without angina pectoris: Secondary | ICD-10-CM | POA: Insufficient documentation

## 2020-07-11 DIAGNOSIS — I5022 Chronic systolic (congestive) heart failure: Secondary | ICD-10-CM

## 2020-07-11 DIAGNOSIS — Z7902 Long term (current) use of antithrombotics/antiplatelets: Secondary | ICD-10-CM | POA: Insufficient documentation

## 2020-07-11 DIAGNOSIS — I350 Nonrheumatic aortic (valve) stenosis: Secondary | ICD-10-CM

## 2020-07-11 DIAGNOSIS — I255 Ischemic cardiomyopathy: Secondary | ICD-10-CM | POA: Diagnosis not present

## 2020-07-11 DIAGNOSIS — Z8249 Family history of ischemic heart disease and other diseases of the circulatory system: Secondary | ICD-10-CM | POA: Insufficient documentation

## 2020-07-11 DIAGNOSIS — R0602 Shortness of breath: Secondary | ICD-10-CM | POA: Diagnosis present

## 2020-07-11 DIAGNOSIS — I130 Hypertensive heart and chronic kidney disease with heart failure and stage 1 through stage 4 chronic kidney disease, or unspecified chronic kidney disease: Secondary | ICD-10-CM | POA: Insufficient documentation

## 2020-07-11 DIAGNOSIS — C61 Malignant neoplasm of prostate: Secondary | ICD-10-CM | POA: Diagnosis not present

## 2020-07-11 LAB — BASIC METABOLIC PANEL
Anion gap: 10 (ref 5–15)
BUN: 18 mg/dL (ref 8–23)
CO2: 24 mmol/L (ref 22–32)
Calcium: 9.4 mg/dL (ref 8.9–10.3)
Chloride: 101 mmol/L (ref 98–111)
Creatinine, Ser: 1.32 mg/dL — ABNORMAL HIGH (ref 0.61–1.24)
GFR calc Af Amer: 56 mL/min — ABNORMAL LOW (ref >60)
GFR calc non Af Amer: 49 mL/min — ABNORMAL LOW (ref >60)
Glucose, Bld: 95 mg/dL (ref 70–99)
Potassium: 4.1 mmol/L (ref 3.5–5.1)
Sodium: 135 mmol/L (ref 135–145)

## 2020-07-11 LAB — ECHOCARDIOGRAM COMPLETE
AR max vel: 1.95 cm2
AV Area VTI: 2.35 cm2
AV Area mean vel: 1.82 cm2
AV Mean grad: 8 mmHg
AV Peak grad: 13.5 mmHg
Ao pk vel: 1.84 m/s
Area-P 1/2: 3.42 cm2
Calc EF: 31 %
S' Lateral: 4.4 cm
Single Plane A2C EF: 33.2 %
Single Plane A4C EF: 24.2 %

## 2020-07-11 LAB — BRAIN NATRIURETIC PEPTIDE: B Natriuretic Peptide: 317.5 pg/mL — ABNORMAL HIGH (ref 0.0–100.0)

## 2020-07-11 MED ORDER — FUROSEMIDE 20 MG PO TABS: 20.0000 mg | ORAL_TABLET | ORAL | 0 refills | Status: AC | PRN

## 2020-07-11 MED ORDER — LOSARTAN POTASSIUM 25 MG PO TABS
12.5000 mg | ORAL_TABLET | Freq: Every day | ORAL | 3 refills | Status: DC
Start: 2020-07-11 — End: 2020-11-03

## 2020-07-11 MED ORDER — SPIRONOLACTONE 25 MG PO TABS: 12.5000 mg | ORAL_TABLET | Freq: Every day | ORAL | 3 refills | Status: DC

## 2020-07-11 MED ORDER — RANOLAZINE ER 500 MG PO TB12: 500.0000 mg | ORAL_TABLET | Freq: Two times a day (BID) | ORAL | 6 refills | Status: DC

## 2020-07-11 NOTE — Patient Instructions (Addendum)
Start Losartan 12.5 mg (1/2 tab) Daily  Labs done today, your results will be available in MyChart, we will contact you for abnormal readings.  Labs needed again in 1 week, we have provided you a prescription to have this done locally  Please follow up with our heart failure pharmacist in 3-4 weeks  You have been referred to Cardiac Rehab at [facility], they will call you to schedule this  Your physician recommends that you schedule a follow-up appointment in: 6 weeks  If you have any questions or concerns before your next appointment please send [place] a message through [place] or call our office at [phone].    TO LEAVE A MESSAGE FOR THE NURSE SELECT OPTION 2, PLEASE LEAVE A MESSAGE INCLUDING:  YOUR NAME  DATE OF BIRTH  CALL BACK NUMBER  REASON FOR CALL**this is important as we prioritize the call backs  YOU WILL RECEIVE A CALL BACK THE SAME DAY AS LONG AS YOU CALL BEFORE 4:00 PM  At the [facility], you and your health needs are our priority. As part of our continuing mission to provide you with exceptional heart care, we have created designated Provider Care Teams. These Care Teams include your primary Cardiologist (physician) and Advanced Practice Providers (APPs- Physician Assistants and Nurse Practitioners) who all work together to provide you with the care you need, when you need it.   You may see any of the following providers on your designated Care Team at your next follow up:  Dr Glori Bickers  Dr Haynes Kerns, NP  Lyda Jester, [place]  Audry Riles, PharmD   Please be sure to bring in all your medications bottles to every appointment.

## 2020-07-11 NOTE — Progress Notes (Signed)
Echocardiogram 2D Echocardiogram has been performed.  Oneal Deputy Athel Merriweather 07/11/2020, 10:51 AM

## 2020-07-13 NOTE — Progress Notes (Signed)
PCP: Tracie Harrier, MD HF Cardiology: Dr. Aundra Dubin  86 y.o. with history of CAD s/p multiple PCIs, ischemic cardiomyopathy, sick sinus syndrome s/p PPM, and CKD stage 3 returns for followup of CHF and lightheadedness.   Patient has a long history of CAD detailed in the [place] below.  He also has an ischemic cardiomyopathy, with echo in 1/20 showing EF 40-45%. He had a Medtronic pacemaker placed for bradycardia in 2012. He was admitted with somewhat atypical chest pain in 5/20, TnI was mildly elevated at 0.08, with serial troponins there was no trend (maximal 0.09).  He underwent left heart cath, showing stable disease with no intervention planned (chronic occlusion of mid LCx and 85% ostial PDA).  His echo showed that EF was lower than prior at 20-25%.  He was thought to be volume overloaded.  He was diuresed with IV Lasix, and Coreg + Entresto were started.  Patient's BP fell significantly with initiation of cardiac meds and he had orthostatic symptoms.  All his cardiac meds had to be stopped.  There was concern that he may have low output HF and may require inotropic support, so he was transferred to [facility].  RHC at [facility] showed low filling pressures and normal cardiac output.  He continued to have dizziness and also cerebellar signs.  He was seen by neurology and suspected to have a cerebellar CVA.  CTA head showed multiple old small vessel infarcts, no MRI done due to PPM.  Eliquis was started based on neurology recommendation given low EF and CVA.  He gradually improved and was discharged home.   Echo was done in 10/20, ordered by neurology.  EF remained 20-25%, but aortic stenosis was reported as severe. I had not seen this echo prior but reviewed today. Calculated AVA < 1 but highest mean gradient obtained was 16 mmHg.  Based on appearance of valve, this certainly could be low gradient severe AS but valve not seen well.    Low dose DSE was done in 1/21.  This was suggestive of moderate AS  approaching severe.   Patient was noted to have a pancreatic mass on CT but PET scan was not suggestive of pancreatic cancer.  He does have prostate cancer and was treated with radiation.   Given worsening dyspnea after prostate cancer radiation, he had a TEE which showed low flow/low gradient severe AS.  LHC/RHC was done in 6/21 showing stable coronary disease with normal filling pressures on the RHC.  He had TAVR in 6/21 with Edwards #26 Sapien valve.  Post-op echo in 6/21 showed EF 25-30%, stable TAVR valve.   Repeat echo was done today, showing EF 25-30%, mildly decreased RV function, bioprosthetic aortic valve s/p TAVR with mild AI, mean gradient 8 mmHg.   He returns today for followup of CHF and AS.   He has not felt much different since TAVR, still short of breath after walking about 100 feet.  This began when he had radiation for his prostate cancer.  No orthopnea/PND.  He had a chest CT peri-TAVR showing clear lung fields. No orthopnea/PND. No lightheadedness/dizziness.  No chest pain.   Labs (6/20): K 5.1 => 4.2, creatinine 1.46 => 1.33, LDL 75 Labs (12/20): K 4.2, creatinine 1.27 Labs (1/21):  Myeloma panel negative, urine IFXN negative, LDL 49 Labs (2/21): K 4.4, creatinine 1.54, hgb 13.6 Labs (4/21): hgb 12.4 Labs (5/21): K 5.2, creatinine 1.3  ECG (personally reviewed): AV dual pacing  Past Medical History: 1. Low back pain/degenerative disc disease.  2. Sick sinus syndrome: Dual chamber Medtronic PPM placed 8/12.  3. Aortic stenosis: Mild to moderate on 5/20 echo.  - Echo in 10/20 with ?low gradient severe AS with mean gradient only 15 mmHg but calculated AVA 0.78 cm^2.  - Low dose DSE in 1/21: Moderate AS approaching severe. Rest mean gradient 13 with DVI 0.31 and AVA 1.1 cm^2 => peak stress mean gradient 34 mmHg with DVI 0.28 and AVA 1.0 cm^2.  - TEE 5/21 with severe low flow/low gradient AS.  - TAVR 6/21 with #26 Oletta Lamas Sapien valve.  4. CAD: Extensive history.  - 7/06:  Inferior MI with Cypher DES to prox and distal RCA.  - 07/20/05: Taxus DES to mLAD.  - 1/11: Xience DES to OM1 - 4/15: Overlapping Xience DES x 2 to ostial and proximal RCA.  - LHC (5/20): Nonobstructive disease in LAD, occluded mid LCx (chronic), 85% ostial PDA.  No intervention.  - LHC (6/21): 80% mid PLV (chronic), totally occluded LCx after OM1 (chronic), 50% proximal and 50-60% distal LAD stenosis.  5. Chronic systolic CHF: Ischemic cardiomyopathy.   - Echo (1/20) with EF 40-45%.  - Echo (5/20) with EF 20-25%, normal RV, mild-moderate MR, mild-moderate AS with mean gradient 11 mmHg and AVA 1.47 cm^2.  - RHC (5/20): mean RA 3, PA 38/8 mean 18, mean PCWP 6, CI 2.68.  - Echo (10/20): EF 20-25%, possible low gradient severe AS.  - PYP scan negative in 2/21.  - Echo (7/21, post-TAVR): EF 25-30%, mildly decreased RV systolic function, normal IVC, bioprosthetic aortic valve s/p TAVR with mean gradient 8 and mild AI.  6. CKD: Stage 3.  7. COPD: Remote smoking. 8. HTN 9. Hyperlipidemia 10. Depression 11. CVA: Suspected cerebellar CVA in 5/20.  Eliquis started.  - CTA head (unable to do MRI with PPM): <50% carotid stenosis bilaterally.  Multiple old small vessel infarcts.  12. Prostate Cancer: Radiation therapy, ending 5/21.  13. Pancreatic mass: Elevated CA 19-9.  PET scan was not suggestive of cancer.  14. Ascending aortic aneurysm: 4.5 cm on 6/21 CT chest.   Social History   Socioeconomic History  . Marital status: Widowed    Spouse name: Not on file  . Number of children: 1  . Years of education: Not on file  . Highest education level: Not on file  Occupational History  . Occupation: retired-farmer  Tobacco Use  . Smoking status: Former Smoker    Years: 10.00    Types: Cigarettes    Quit date: 1972    Years since quitting: 49.5  . Smokeless tobacco: Former Systems developer    Types: Secondary school teacher  . Vaping Use: Never used  Substance and Sexual Activity  . Alcohol use: No  . Drug  use: No  . Sexual activity: Not on file  Other Topics Concern  . Not on file  Social History Narrative  . Not on file   Social Determinants of Health   Financial Resource Strain: Low Risk   . Difficulty of Paying Living Expenses: Not hard at all  Food Insecurity: No Food Insecurity  . Worried About Charity fundraiser in the Last Year: Never true  . Ran Out of Food in the Last Year: Never true  Transportation Needs: No Transportation Needs  . Lack of Transportation (Medical): No  . Lack of Transportation (Non-Medical): No  Physical Activity: Insufficiently Active  . Days of Exercise per Week: 4 days  . Minutes of Exercise per Session: 10 min  Stress: Stress Concern Present  . Feeling of Stress : Very much  Social Connections:   . Frequency of Communication with Friends and Family:   . Frequency of Social Gatherings with Friends and Family:   . Attends Religious Services:   . Active Member of Clubs or Organizations:   . Attends Archivist Meetings:   Marland Kitchen Marital Status:   Intimate Partner Violence:   . Fear of Current or Ex-Partner:   . Emotionally Abused:   Marland Kitchen Physically Abused:   . Sexually Abused:    Family History  Problem Relation Age of Onset  . Heart attack Father   . Bladder Cancer Neg Hx   . Kidney cancer Neg Hx   . Prostate cancer Neg Hx    ROS: All systems reviewed and negative except as per HPI.   Current Outpatient Medications  Medication Sig Dispense Refill  . albuterol (VENTOLIN HFA) 108 (90 Base) MCG/ACT inhaler Inhale 1-2 puffs into the lungs every 6 (six) hours as needed for wheezing or shortness of breath.    Marland Kitchen aspirin 81 MG chewable tablet Chew 1 tablet (81 mg total) by mouth daily.    Marland Kitchen atorvastatin (LIPITOR) 80 MG tablet Take 80 mg by mouth daily at 6 PM.     . carvedilol (COREG) 6.25 MG tablet Take 1 tablet (6.25 mg total) by mouth 2 (two) times daily with a meal. 60 tablet 5  . clopidogrel (PLAVIX) 75 MG tablet Take 1 tablet (75 mg  total) by mouth daily. 90 tablet 1  . furosemide (LASIX) 20 MG tablet Take 1 tablet (20 mg total) by mouth as needed (weight. Take one tablet if weight increases by 3 pounds overnight or 5 pounds in one week.). 30 tablet 0  . ipratropium-albuterol (DUONEB) 0.5-2.5 (3) MG/3ML SOLN Take 3 mLs by nebulization every 6 (six) hours as needed. (Patient taking differently: Take 3 mLs by nebulization every 6 (six) hours as needed (wheezing/shortness of breath.). ) 360 mL 1  . ranolazine (RANEXA) 500 MG 12 hr tablet Take 1 tablet (500 mg total) by mouth 2 (two) times daily. 60 tablet 6  . spironolactone (ALDACTONE) 25 MG tablet Take 0.5 tablets (12.5 mg total) by mouth at bedtime. 45 tablet 3  . venlafaxine XR (EFFEXOR-XR) 75 MG 24 hr capsule Take 1 capsule (75 mg total) by mouth daily with breakfast. (Patient taking differently: Take 75 mg by mouth daily as needed (depression). ) 30 capsule 0  . losartan (COZAAR) 25 MG tablet Take 0.5 tablets (12.5 mg total) by mouth daily. 15 tablet 3   No current facility-administered medications for this encounter.     BP (!) 108/58   Pulse 64   Wt 82.6 kg (182 lb)   SpO2 95%   BMI 24.68 kg/m  General: NAD Neck: No JVD, no thyromegaly or thyroid nodule.  Lungs: Clear to auscultation bilaterally with normal respiratory effort. CV: Nondisplaced PMI.  Heart regular S1/S2, no S3/S4, 1/6 SEM RUSB.  No peripheral edema.  No carotid bruit.  Normal pedal pulses.  Abdomen: Soft, nontender, no hepatosplenomegaly, no distention.  Skin: Intact without lesions or rashes.  Neurologic: Alert and oriented x 3.  Psych: Normal affect. Extremities: No clubbing or cyanosis.  HEENT: Normal.    Assessment/Plan: 1. Chronic systolic CHF: Ischemic cardiomyopathy. Echo in 1/20 with EF 40-45%, but now down to 20-25% on echo from 5/20. The cause of the fall in EF is uncertain, he had coronary angi[facility] at [facility] in 5/20 with stable  anatomy (no intervention).  He was been pacing his RV  only 5% of the time when device was interrogated.  RHC in 5/20 showed low filling pressures and good cardiac output.  Echo in 10/20 showed stable EF 20-25% range.  Echo was done today and reviewed, EF remains 25-30% with stable bioprosthetic aortic valve s/p TAVR.  BP has tended to run low with orthostatic hypotension when cardiac meds are added but doing well on current regimen. No evidence for amyloidosis with negative myeloma panel and urine IFXN as well as PYP scan negative.  Increased dyspnea in the last few month, NYHA class III symptoms, but he is not volume overloaded on exam and IVC was small in size on echo done today.  CT chest recently showed no definite lung pathology.  TAVR did not help symptoms appreciably.  - Continue Coreg 6.25 mg bid.     - He does not appear to need a diuretic.  - Start low dose losartan 12.5 mg daily with BMET today and again in 10 days.  If he tolerates this, could transition to Megargel.   - Continue spironolactone 12.5 daily.   - He is noted to be RV pacing today on ECG, this has been rare in the past => could increased RV pacing be worsening his symptoms? I will ask device clinic to check on this.  - Deconditioning could be playing a role in his increased dyspnea/fatigue.  Lack of activity during recent prostate cancer radiation treatment.  I will set him up with cardiac rehab at [facility].  2. CAD: Multiple interventions in the past.  Cath in 6/21 showed stable anatomy, no intervention. No chest pain.  - He is on ASA 81 + Plavix post-TAVR.  - Continue atorvastatin 80 mg daily, good lipids in 1/21.   - Continue ranolazine.  3. CKD: Stage 3.  - BMET today.  4. Aortic stenosis: Low flow, low gradient AS s/p TAVR with #26 Edwards Sapien bioprosthetic aortic valve in 6/21.  Echo was done today, valve stable with mild peri-valvular leak.  5. Suspected cerebellar CVA: In 5/20.  Dizziness/?vertigo with dysmetria and diplopia on exam.  Seen by neurology and felt to have  cerebellar stroke, repeat CT head with no acute changes (old small vessel infarcts). Carotid u/s 1-39% bilaterally. Vertebrals ok.  No MRI due to PPM.  Had CTA head/neck without large vessel occlusion or stenosis.  - Currently on ASA and Plavix.  6. Sick sinus syndrome: MDT dual chamber pacemaker, RV pacing appeared rare in the past but he is pacing today.  - Will have device clinic check his pacemaker => is he pacing more than in the past?  7. Prostate cancer: He has completed radiation therapy.   Followup 3 wks with HF pharmacist (?Entresto) and 6 wks with me.   Loralie Champagne 07/13/2020

## 2020-07-14 ENCOUNTER — Telehealth: Payer: Self-pay | Admitting: Physician Assistant

## 2020-07-14 NOTE — Telephone Encounter (Signed)
  [facility]   MULTIDISCIPLINARY HEART VALVE TEAM   Pt had pre TAVR CT on 05/30/20 showed   IMPRESSION: 1. Vascular findings and measurements pertinent to potential TAVR procedure, as detailed above. 2. Severe thickening calcification of the aortic valve, compatible with the reported clinical history of severe aortic stenosis. 3. Aortic atherosclerosis, in addition to left main and 3 vessel coronary artery disease. In addition, there is aneurysmal dilatation of the ascending thoracic aorta (4.5 cm in diameter). Ascending thoracic aortic aneurysm. Recommend semi-annual imaging followup by CTA or MRA and referral to cardiothoracic surgery if not already obtained. This recommendation follows 2010 ACCF/AHA/AATS/ACR/ASA/SCA/SCAI/SIR/STS/SVM Guidelines for the Diagnosis and Management of Patients With Thoracic Aortic Disease. Circulation. 2010; 121: Y111-N356. Aortic aneurysm NOS (ICD10-I71.9). 4. Mild cardiomegaly. 5. Cholelithiasis without evidence of acute cholecystitis at this time. 6. Stable low-attenuation lesion in the head of the pancreas which remains incompletely characterized on today's examination. Given the lack of hypermetabolism on the prior PET-CT, this may represent a benign lesion, but further characterization with nonemergent abdominal MRI with and without IV gadolinium with MRCP is suggested to definitively characterize this lesion.   7. Colonic diverticulosis without evidence of acute diverticulitis at this time. 8. Additional incidental findings, as above.  #3- pt aware and discussed with Dr. Aundra Dubin. We will follow this over time.   #6 - Dr. Grayland Ormond has ordered an abdominal CT to follow pancreatic mass which is scheduled for 08/2020.   Angelena Form PA-C  [facility]

## 2020-07-14 NOTE — Addendum Note (Signed)
Encounter addended by: Eileen Stanford, PA-C on: 07/14/2020 1:37 PM  Actions taken: Flowsheet accepted

## 2020-07-17 ENCOUNTER — Encounter: Payer: Medicare Other | Attending: Cardiology | Admitting: *Deleted

## 2020-07-17 ENCOUNTER — Encounter: Payer: Self-pay | Admitting: *Deleted

## 2020-07-17 ENCOUNTER — Other Ambulatory Visit: Payer: Self-pay

## 2020-07-17 DIAGNOSIS — Z952 Presence of prosthetic heart valve: Secondary | ICD-10-CM

## 2020-07-17 NOTE — Progress Notes (Signed)
Virtual orientation call completed today. he has an appointment on Date: 07/21/2020 for EP eval and gym Orientation.  Documentation of diagnosis can be found in [facility] Date: 06/17/2020.

## 2020-07-17 NOTE — Progress Notes (Signed)
Message fwd to device clinic.  

## 2020-07-21 ENCOUNTER — Encounter: Payer: Medicare Other | Attending: Internal Medicine

## 2020-07-21 ENCOUNTER — Other Ambulatory Visit: Payer: Self-pay

## 2020-07-21 DIAGNOSIS — Z7982 Long term (current) use of aspirin: Secondary | ICD-10-CM | POA: Diagnosis not present

## 2020-07-21 DIAGNOSIS — Z952 Presence of prosthetic heart valve: Secondary | ICD-10-CM | POA: Diagnosis present

## 2020-07-21 DIAGNOSIS — Z79899 Other long term (current) drug therapy: Secondary | ICD-10-CM | POA: Insufficient documentation

## 2020-07-21 DIAGNOSIS — Z95810 Presence of automatic (implantable) cardiac defibrillator: Secondary | ICD-10-CM | POA: Diagnosis not present

## 2020-07-21 DIAGNOSIS — I110 Hypertensive heart disease with heart failure: Secondary | ICD-10-CM | POA: Insufficient documentation

## 2020-07-21 DIAGNOSIS — I5022 Chronic systolic (congestive) heart failure: Secondary | ICD-10-CM | POA: Diagnosis not present

## 2020-07-21 DIAGNOSIS — Z87891 Personal history of nicotine dependence: Secondary | ICD-10-CM | POA: Diagnosis not present

## 2020-07-21 DIAGNOSIS — I252 Old myocardial infarction: Secondary | ICD-10-CM | POA: Diagnosis not present

## 2020-07-21 DIAGNOSIS — Z7902 Long term (current) use of antithrombotics/antiplatelets: Secondary | ICD-10-CM | POA: Insufficient documentation

## 2020-07-21 DIAGNOSIS — E785 Hyperlipidemia, unspecified: Secondary | ICD-10-CM | POA: Insufficient documentation

## 2020-07-21 NOTE — Progress Notes (Signed)
Cardiac Individual Treatment Plan  Patient Details  Name: Kristopher Fernandez MRN: [id number] Date of Birth: June 08, 1932 Referring Provider:     Cardiac Rehab from 07/21/2020 in [facility] Cardiac and Pulmonary Rehab  Referring Provider Aundra Dubin      Initial Encounter Date:    Cardiac Rehab from 07/21/2020 in [facility] Cardiac and Pulmonary Rehab  Date 07/21/20      Visit Diagnosis: S/P TAVR (transcatheter aortic valve replacement)  Patient's Home Medications on Admission:  Current Outpatient Medications:  .  albuterol (VENTOLIN HFA) 108 (90 Base) MCG/ACT inhaler, Inhale 1-2 puffs into the lungs every 6 (six) hours as needed for wheezing or shortness of breath., Disp: , Rfl:  .  aspirin 81 MG chewable tablet, Chew 1 tablet (81 mg total) by mouth daily., Disp: , Rfl:  .  atorvastatin (LIPITOR) 80 MG tablet, Take 80 mg by mouth daily at 6 PM. , Disp: , Rfl:  .  carvedilol (COREG) 6.25 MG tablet, Take 1 tablet (6.25 mg total) by mouth 2 (two) times daily with a meal., Disp: 60 tablet, Rfl: 5 .  clopidogrel (PLAVIX) 75 MG tablet, Take 1 tablet (75 mg total) by mouth daily., Disp: 90 tablet, Rfl: 1 .  furosemide (LASIX) 20 MG tablet, Take 1 tablet (20 mg total) by mouth as needed (weight. Take one tablet if weight increases by 3 pounds overnight or 5 pounds in one week.)., Disp: 30 tablet, Rfl: 0 .  ipratropium-albuterol (DUONEB) 0.5-2.5 (3) MG/3ML SOLN, Take 3 mLs by nebulization every 6 (six) hours as needed. (Patient taking differently: Take 3 mLs by nebulization every 6 (six) hours as needed (wheezing/shortness of breath.). ), Disp: 360 mL, Rfl: 1 .  losartan (COZAAR) 25 MG tablet, Take 0.5 tablets (12.5 mg total) by mouth daily., Disp: 15 tablet, Rfl: 3 .  ranolazine (RANEXA) 500 MG 12 hr tablet, Take 1 tablet (500 mg total) by mouth 2 (two) times daily., Disp: 60 tablet, Rfl: 6 .  spironolactone (ALDACTONE) 25 MG tablet, Take 0.5 tablets (12.5 mg total) by mouth at bedtime., Disp: 45 tablet, Rfl: 3 .   venlafaxine XR (EFFEXOR-XR) 75 MG 24 hr capsule, Take 1 capsule (75 mg total) by mouth daily with breakfast. (Patient taking differently: Take 75 mg by mouth daily as needed (depression). ), Disp: 30 capsule, Rfl: 0  Past Medical History: Past Medical History:  Diagnosis Date  . Anginal pain ([place])   . Aortic stenosis   . Aortic stenosis   . Basal cell carcinoma, ear 03/31/2015  . Bradycardia 07/03/2014  . CHF (congestive heart failure) ([place])   . DDD (degenerative disc disease), lumbar 08/06/2015  . H/O cardiac catheterization 07/03/2014   Overview:  Cypher stent proximal and distal RCA 07/18/05 and TAXUS stent mid LAD 07/20/05 at [facility]  . HTN (hypertension) 07/03/2014  . Hyperlipidemia 07/03/2014  . Ischemic cardiomyopathy   . Lumbosacral radiculopathy at S1 08/22/2015  . MI (myocardial infarction) ([place]) 07/03/2014   Overview:  Mi 07/17/05  . Myocardial infarction ([place])   . Normocytic normochromic anemia 09/14/2015  . Pacemaker 07/03/2014   Overview:  Dual chamber pacemaker 08/11/11  . Pancreatic mass    not hypermetabolic on PET-CT imaging  . Prostate cancer ([place])    metastatic  . 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 Shortness of breath dyspnea   . SOB (shortness of breath) on exertion 05/13/2016  Tobacco Use: Social History   Tobacco Use  Smoking Status Former Smoker  . Years: 10.00  . Types: Cigarettes  . Quit date: 9  . Years since quitting: 49.6  Smokeless Tobacco Former Systems developer  . Types: Chew  Tobacco Comment   Quit 1977    Labs: Recent Review Flowsheet Data    Labs for ITP Cardiac and Pulmonary Rehab Latest Ref Rng & Units 05/22/2020 06/13/2020 06/17/2020 06/17/2020 06/17/2020   Cholestrol 0 - 200 mg/dL - - - - -   LDLCALC 0 - 99 mg/dL - - - - -   HDL >40 mg/dL - - - - -   Trlycerides <150 mg/dL - - - - -   Hemoglobin A1c 4.8 - 5.6 % - 5.6 - - -   PHART 7.35  - 7.45 - 7.425 7.324(L) - -   PCO2ART 32 - 48 mmHg - 37.7 53.1(H) - -   HCO3 20.0 - 28.0 mmol/L 24.7 24.3 27.6 - -   TCO2 22 - 32 mmol/L 26 - _0 ACIDBASEDEF 0.0 - 2.0 mmol/L 1.0 - - - -   O2SAT % 64.0 94.3 99.0 - -       Exercise Target Goals: Exercise Program Goal: Individual exercise prescription set using results from initial 6 min walk test and THRR while considering  patient's activity barriers and safety.   Exercise Prescription Goal: Initial exercise prescription builds to 30-45 minutes a day of aerobic activity, 2-3 days per week.  Home exercise guidelines will be given to patient during program as part of exercise prescription that the participant will acknowledge.   Education: Aerobic Exercise & Resistance Training: - Gives group verbal and written instruction on the various components of exercise. Focuses on aerobic and resistive training programs and the benefits of this training and how to safely progress through these programs..   Education: Exercise & Equipment Safety: - Individual verbal instruction and demonstration of equipment use and safety with use of the equipment.   Cardiac Rehab from 07/21/2020 in [facility] Cardiac and Pulmonary Rehab  Date 07/21/20  Educator AS  Instruction Review Code 1- Verbalizes Understanding      Education: Exercise Physiology & General Exercise Guidelines: - Group verbal and written instruction with models to review the exercise physiology of the cardiovascular system and associated critical values. Provides general exercise guidelines with specific guidelines to those with heart or lung disease.    Education: Flexibility, Balance, Mind/Body Relaxation: Provides group verbal/written instruction on the benefits of flexibility and balance training, including mind/body exercise modes such as yoga, pilates and tai chi.  Demonstration and skill practice provided.   Activity Barriers & Risk Stratification:  Activity Barriers & Cardiac  Risk Stratification - 07/17/20 1506      Activity Barriers & Cardiac Risk Stratification   Activity Barriers Shortness of Breath;Deconditioning   Uses a cane   Cardiac Risk Stratification Low           6 Minute Walk:  6 Minute Walk    Row Name 07/21/20 1505         6 Minute Walk   Phase Initial     Distance 655 feet     Walk Time 6 minutes     # of Rest Breaks 0     MPH 1.24     METS 1     RPE 13     Perceived Dyspnea  2     VO2 Peak 3.5     Symptoms  No     Resting HR 77 bpm     Resting BP 112/66     Resting Oxygen Saturation  94 %     Exercise Oxygen Saturation  during 6 min walk 88 %     Max Ex. HR 91 bpm     Max Ex. BP 138/66     2 Minute Post BP 118/68            Oxygen Initial Assessment:   Oxygen Re-Evaluation:   Oxygen Discharge (Final Oxygen Re-Evaluation):   Initial Exercise Prescription:  Initial Exercise Prescription - 07/21/20 1500      Date of Initial Exercise RX and Referring Provider   Date 07/21/20    Referring Provider Aundra Dubin      Treadmill   MPH 1    Grade 0    Minutes 15    METs 1.77      Recumbant Bike   Level 1    RPM 60    Minutes 15    METs 1      NuStep   Level 1    SPM 80    Minutes 15    METs 1      Prescription Details   Frequency (times per week) 3    Duration Progress to 30 minutes of continuous aerobic without signs/symptoms of physical distress      Intensity   THRR 40-80% of Max Heartrate 100-123    Ratings of Perceived Exertion 11-13    Perceived Dyspnea 0-4      Resistance Training   Training Prescription Yes    Weight 3 lb    Reps 10-15           Perform Capillary Blood Glucose checks as needed.  Exercise Prescription Changes:  Exercise Prescription Changes    Row Name 07/21/20 1500             Response to Exercise   Blood Pressure (Admit) 112/66       Blood Pressure (Exercise) 138/66       Blood Pressure (Exit) 118/68       Heart Rate (Admit) 77 bpm       Heart Rate (Exercise)  91 bpm       Heart Rate (Exit) 82 bpm       Oxygen Saturation (Admit) 94 %       Oxygen Saturation (Exercise) 88 %       Oxygen Saturation (Exit) 94 %       Rating of Perceived Exertion (Exercise) 13       Perceived Dyspnea (Exercise) 2       Symptoms none              Exercise Comments:   Exercise Goals and Review:  Exercise Goals    Row Name 07/21/20 1516             Exercise Goals   Increase Physical Activity Yes       Intervention Provide advice, education, support and counseling about physical activity/exercise needs.;Develop an individualized exercise prescription for aerobic and resistive training based on initial evaluation findings, risk stratification, comorbidities and participant's personal goals.       Expected Outcomes Short Term: Attend rehab on a regular basis to increase amount of physical activity.;Long Term: Add in home exercise to make exercise part of routine and to increase amount of physical activity.;Long Term: Exercising regularly at least 3-5 days a week.       Increase Strength and  Stamina Yes       Intervention Provide advice, education, support and counseling about physical activity/exercise needs.;Develop an individualized exercise prescription for aerobic and resistive training based on initial evaluation findings, risk stratification, comorbidities and participant's personal goals.       Expected Outcomes Short Term: Increase workloads from initial exercise prescription for resistance, speed, and METs.;Short Term: Perform resistance training exercises routinely during rehab and add in resistance training at home;Long Term: Improve cardiorespiratory fitness, muscular endurance and strength as measured by increased METs and functional capacity (6MWT)       Able to understand and use rate of perceived exertion (RPE) scale Yes       Intervention Provide education and explanation on how to use RPE scale       Expected Outcomes Short Term: Able to use RPE  daily in rehab to express subjective intensity level;Long Term:  Able to use RPE to guide intensity level when exercising independently       Able to understand and use Dyspnea scale Yes       Intervention Provide education and explanation on how to use Dyspnea scale       Expected Outcomes Short Term: Able to use Dyspnea scale daily in rehab to express subjective sense of shortness of breath during exertion;Long Term: Able to use Dyspnea scale to guide intensity level when exercising independently       Knowledge and understanding of Target Heart Rate Range (THRR) Yes       Intervention Provide education and explanation of THRR including how the numbers were predicted and where they are located for reference       Expected Outcomes Short Term: Able to state/look up THRR;Short Term: Able to use daily as guideline for intensity in rehab;Long Term: Able to use THRR to govern intensity when exercising independently       Able to check pulse independently Yes       Intervention Provide education and demonstration on how to check pulse in carotid and radial arteries.;Review the importance of being able to check your own pulse for safety during independent exercise       Expected Outcomes Short Term: Able to explain why pulse checking is important during independent exercise;Long Term: Able to check pulse independently and accurately       Understanding of Exercise Prescription Yes       Intervention Provide education, explanation, and written materials on patient's individual exercise prescription       Expected Outcomes Short Term: Able to explain program exercise prescription;Long Term: Able to explain home exercise prescription to exercise independently              Exercise Goals Re-Evaluation :   Discharge Exercise Prescription (Final Exercise Prescription Changes):  Exercise Prescription Changes - 07/21/20 1500      Response to Exercise   Blood Pressure (Admit) 112/66    Blood Pressure  (Exercise) 138/66    Blood Pressure (Exit) 118/68    Heart Rate (Admit) 77 bpm    Heart Rate (Exercise) 91 bpm    Heart Rate (Exit) 82 bpm    Oxygen Saturation (Admit) 94 %    Oxygen Saturation (Exercise) 88 %    Oxygen Saturation (Exit) 94 %    Rating of Perceived Exertion (Exercise) 13    Perceived Dyspnea (Exercise) 2    Symptoms none           Nutrition:  Target Goals: Understanding of nutrition guidelines, daily intake of sodium <  1523m, cholesterol <2063m calories 30% from fat and 7% or less from saturated fats, daily to have 5 or more servings of fruits and vegetables.  Education: Controlling Sodium/Reading Food Labels -Group verbal and written material supporting the discussion of sodium use in heart healthy nutrition. Review and explanation with models, verbal and written materials for utilization of the food label.   Education: General Nutrition Guidelines/Fats and Fiber: -Group instruction provided by verbal, written material, models and posters to present the general guidelines for heart healthy nutrition. Gives an explanation and review of dietary fats and fiber.   Biometrics:    Nutrition Therapy Plan and Nutrition Goals:  Nutrition Therapy & Goals - 07/21/20 1403      Nutrition Therapy   Diet High calorie, High protein pulmonary MNT diet.    Protein (specify units) 80-85g    Fiber 30 grams    Whole Grain Foods 3 servings    Saturated Fats 12 max. grams    Fruits and Vegetables 5 servings/day    Sodium 1.5 grams      Personal Nutrition Goals   Nutrition Goal ST: increase protein at meals LT: gain strength    Comments Pt reports loving eggs and cereal (raisin bran) - whole milk or waffles or pancake. L: sandwich (tomato right now) (white bread) or corn on the cobb or some beans (pintos) D: pork and beans. Discussed Pulmonary Nutrition, heart healthy nutrition, and high protein nutrition. His COPD is newly diagnosed and he is in remission of cancer -  treatments ended in May. Pt reports not getting strength back.      Intervention Plan   Intervention Prescribe, educate and counsel regarding individualized specific dietary modifications aiming towards targeted core components such as weight, hypertension, lipid management, diabetes, heart failure and other comorbidities.;Nutrition handout(s) given to patient.    Expected Outcomes Short Term Goal: Understand basic principles of dietary content, such as calories, fat, sodium, cholesterol and nutrients.;Long Term Goal: Adherence to prescribed nutrition plan.;Short Term Goal: A plan has been developed with personal nutrition goals set during dietitian appointment.           Nutrition Assessments:  Nutrition Assessments - 07/21/20 1526      MEDFICTS Scores   Pre Score 52           MEDIFICTS Score Key:          ?70 Need to make dietary changes          40-70 Heart Healthy Diet         ? 40 Therapeutic Level Cholesterol Diet  Nutrition Goals Re-Evaluation:   Nutrition Goals Discharge (Final Nutrition Goals Re-Evaluation):   Psychosocial: Target Goals: Acknowledge presence or absence of significant depression and/or stress, maximize coping skills, provide positive support system. Participant is able to verbalize types and ability to use techniques and skills needed for reducing stress and depression.   Education: Depression - Provides group verbal and written instruction on the correlation between heart/lung disease and depressed mood, treatment options, and the stigmas associated with seeking treatment.   Education: Sleep Hygiene -Provides group verbal and written instruction about how sleep can affect your health.  Define sleep hygiene, discuss sleep cycles and impact of sleep habits. Review good sleep hygiene tips.     Education: Stress and Anxiety: - Provides group verbal and written instruction about the health risks of elevated stress and causes of high stress.  Discuss  the correlation between heart/lung disease and anxiety and treatment options. Review healthy ways  to manage with stress and anxiety.    Initial Review & Psychosocial Screening:  Initial Psych Review & Screening - 07/17/20 1512      Initial Review   Current issues with Current Stress Concerns    Source of Stress Concerns Chronic Illness    Comments Just finished cancer treament May   still weak SOB,  then TAVR    wants to feel stronger   improve SOB      Family Dynamics   Good Support System? Yes   Daughter  [name]   Concerns Recent loss of significant other    Comments Wife of almost 6 years passed away in 2019-02-28      Barriers   Psychosocial barriers to participate in program There are no identifiable barriers or psychosocial needs.      Screening Interventions   Interventions Encouraged to exercise;Provide feedback about the scores to participant;To provide support and resources with identified psychosocial needs    Expected Outcomes Short Term goal: Utilizing psychosocial counselor, staff and physician to assist with identification of specific Stressors or current issues interfering with healing process. Setting desired goal for each stressor or current issue identified.;Long Term Goal: Stressors or current issues are controlled or eliminated.;Short Term goal: Identification and review with participant of any Quality of Life or Depression concerns found by scoring the questionnaire.;Long Term goal: The participant improves quality of Life and PHQ9 Scores as seen by post scores and/or verbalization of changes           Quality of Life Scores:   Quality of Life - 07/21/20 1519      Quality of Life   Select Quality of Life      Quality of Life Scores   Health/Function Pre 26.14 %    Socioeconomic Pre 30 %    Psych/Spiritual Pre 28.29 %    Family Pre 30 %    GLOBAL Pre 28 %          Scores of 19 and below usually indicate a poorer quality of life in these areas.  A difference  of  2-3 points is a clinically meaningful difference.  A difference of 2-3 points in the total score of the Quality of Life Index has been associated with significant improvement in overall quality of life, self-image, physical symptoms, and general health in studies assessing change in quality of life.  PHQ-9: Recent Review Flowsheet Data    Depression screen [facility] 2/9 07/21/2020 07/11/2020   Decreased Interest 0 0   Down, Depressed, Hopeless 0 0   PHQ - 2 Score 0 0   Altered sleeping 3  -   Tired, decreased energy 3  -   Change in appetite 0 -   Feeling bad or failure about yourself  0 -   Trouble concentrating 0 -   Moving slowly or fidgety/restless 0 -   Suicidal thoughts 0 -   PHQ-9 Score 6 -     Interpretation of Total Score  Total Score Depression Severity:  1-4 = Minimal depression, 5-9 = Mild depression, 10-14 = Moderate depression, 15-19 = Moderately severe depression, 20-27 = Severe depression   Psychosocial Evaluation and Intervention:  Psychosocial Evaluation - 07/17/20 1519      Psychosocial Evaluation & Interventions   Interventions Encouraged to exercise with the program and follow exercise prescription    Comments Labrian has no barriers to attending the prgram. He hopes to impove his energy levels and decrease his SOB.  He recently completed Prostate  Cancer treatments and current TAVR.  He lives alone as his wife of almost 65 years passed away in 02/17/19. His daughter [name] checks on him several times a day. He should do well in the program.    Expected Outcomes STG: Jorah attends all scheduled sessions  LTG: Ralphael sees improvement in his energy and SOB symptoms. He is able to utilize the exercise and education learned in program to keep his progress moving forward.    Continue Psychosocial Services  Follow up required by staff           Psychosocial Re-Evaluation:   Psychosocial Discharge (Final Psychosocial Re-Evaluation):   Vocational Rehabilitation: Provide  vocational rehab assistance to qualifying candidates.   Vocational Rehab Evaluation & Intervention:  Vocational Rehab - 07/17/20 1514      Initial Vocational Rehab Evaluation & Intervention   Assessment shows need for Vocational Rehabilitation No           Education: Education Goals: Education classes will be provided on a variety of topics geared toward better understanding of heart health and risk factor modification. Participant will state understanding/return demonstration of topics presented as noted by education test scores.  Learning Barriers/Preferences:  Learning Barriers/Preferences - 07/17/20 1519      Learning Barriers/Preferences   Learning Barriers None    Learning Preferences None           General Cardiac Education Topics:  AED/CPR: - Group verbal and written instruction with the use of models to demonstrate the basic use of the AED with the basic ABC's of resuscitation.   Anatomy & Physiology of the Heart: - Group verbal and written instruction and models provide basic cardiac anatomy and physiology, with the coronary electrical and arterial systems. Review of Valvular disease and Heart Failure   Cardiac Procedures: - Group verbal and written instruction to review commonly prescribed medications for heart disease. Reviews the medication, class of the drug, and side effects. Includes the steps to properly store meds and maintain the prescription regimen. (beta blockers and nitrates)   Cardiac Medications I: - Group verbal and written instruction to review commonly prescribed medications for heart disease. Reviews the medication, class of the drug, and side effects. Includes the steps to properly store meds and maintain the prescription regimen.   Cardiac Medications II: -Group verbal and written instruction to review commonly prescribed medications for heart disease. Reviews the medication, class of the drug, and side effects. (all other drug  classes)    Go Sex-Intimacy & Heart Disease, Get SMART - Goal Setting: - Group verbal and written instruction through game format to discuss heart disease and the return to sexual intimacy. Provides group verbal and written material to discuss and apply goal setting through the application of the S.M.A.R.T. Method.   Other Matters of the Heart: - Provides group verbal, written materials and models to describe Stable Angina and Peripheral Artery. Includes description of the disease process and treatment options available to the cardiac patient.   Infection Prevention: - Provides verbal and written material to individual with discussion of infection control including proper hand washing and proper equipment cleaning during exercise session.   Cardiac Rehab from 07/21/2020 in [facility] Cardiac and Pulmonary Rehab  Date 07/21/20  Educator AS  Instruction Review Code 1- Verbalizes Understanding      Falls Prevention: - Provides verbal and written material to individual with discussion of falls prevention and safety.   Cardiac Rehab from 07/21/2020 in [facility] Cardiac and Pulmonary Rehab  Date 07/21/20  Educator AS  Instruction Review Code 1- Verbalizes Understanding      Other: -Provides group and verbal instruction on various topics (see comments)   Knowledge Questionnaire Score:  Knowledge Questionnaire Score - 07/21/20 1523      Knowledge Questionnaire Score   Pre Score 21/26 heart rate nutrition           Core Components/Risk Factors/Patient Goals at Admission:  Personal Goals and Risk Factors at Admission - 07/17/20 1509      Core Components/Risk Factors/Patient Goals on Admission    Weight Management Yes;Weight Maintenance    Intervention Weight Management: Develop a combined nutrition and exercise program designed to reach desired caloric intake, while maintaining appropriate intake of nutrient and fiber, sodium and fats, and appropriate energy expenditure required for the  weight goal.;Weight Management: Provide education and appropriate resources to help participant work on and attain dietary goals.    Admit Weight 182 lb (82.6 kg)    Goal Weight: Short Term 182 lb (82.6 kg)    Goal Weight: Long Term 182 lb (82.6 kg)    Expected Outcomes Short Term: Continue to assess and modify interventions until short term weight is achieved;Long Term: Adherence to nutrition and physical activity/exercise program aimed toward attainment of established weight goal;Weight Maintenance: Understanding of the daily nutrition guidelines, which includes 25-35% calories from fat, 7% or less cal from saturated fats, less than 262m cholesterol, less than 1.5gm of sodium, & 5 or more servings of fruits and vegetables daily    Hypertension Yes    Intervention Provide education on lifestyle modifcations including regular physical activity/exercise, weight management, moderate sodium restriction and increased consumption of fresh fruit, vegetables, and low fat dairy, alcohol moderation, and smoking cessation.;Monitor prescription use compliance.    Expected Outcomes Short Term: Continued assessment and intervention until BP is < 140/943mHG in hypertensive participants. < 130/8075mG in hypertensive participants with diabetes, heart failure or chronic kidney disease.;Long Term: Maintenance of blood pressure at goal levels.    Lipids Yes    Intervention Provide education and support for participant on nutrition & aerobic/resistive exercise along with prescribed medications to achieve LDL <51m42mDL >40mg31m Expected Outcomes Short Term: Participant states understanding of desired cholesterol values and is compliant with medications prescribed. Participant is following exercise prescription and nutrition guidelines.;Long Term: Cholesterol controlled with medications as prescribed, with individualized exercise RX and with personalized nutrition plan. Value goals: LDL < 51mg,74m > 40 mg.            Education:Diabetes - Individual verbal and written instruction to review signs/symptoms of diabetes, desired ranges of glucose level fasting, after meals and with exercise. Acknowledge that pre and post exercise glucose checks will be done for 3 sessions at entry of program.   Education: Know Your Numbers and Risk Factors: -Group verbal and written instruction about important numbers in your health.  Discussion of what are risk factors and how they play a role in the disease process.  Review of Cholesterol, Blood Pressure, Diabetes, and BMI and the role they play in your overall health.   Core Components/Risk Factors/Patient Goals Review:    Core Components/Risk Factors/Patient Goals at Discharge (Final Review):    ITP Comments:  ITP Comments    Row Name 07/17/20 1523           ITP Comments Virtual orientation call completed today. he has an appointment on Date: 07/21/2020 for EP eval and gym Orientation.  Documentation of diagnosis can be  found in [facility] Date: 06/17/2020.              Comments: initial ITP

## 2020-07-21 NOTE — Patient Instructions (Signed)
Patient Instructions  Patient Details  Name: Kristopher Fernandez MRN: [id number] Date of Birth: Oct 27, 1932 Referring Provider:  Yolonda Kida, MD  Below are your personal goals for exercise, nutrition, and risk factors. Our goal is to help you stay on track towards obtaining and maintaining these goals. We will be discussing your progress on these goals with you throughout the program.  Initial Exercise Prescription:  Initial Exercise Prescription - 07/21/20 1500      Date of Initial Exercise RX and Referring Provider   Date 07/21/20    Referring Provider Aundra Dubin      Treadmill   MPH 1    Grade 0    Minutes 15    METs 1.77      Recumbant Bike   Level 1    RPM 60    Minutes 15    METs 1      NuStep   Level 1    SPM 80    Minutes 15    METs 1      Prescription Details   Frequency (times per week) 3    Duration Progress to 30 minutes of continuous aerobic without signs/symptoms of physical distress      Intensity   THRR 40-80% of Max Heartrate 100-123    Ratings of Perceived Exertion 11-13    Perceived Dyspnea 0-4      Resistance Training   Training Prescription Yes    Weight 3 lb    Reps 10-15           Exercise Goals: Frequency: Be able to perform aerobic exercise two to three times per week in program working toward 2-5 days per week of home exercise.  Intensity: Work with a perceived exertion of 11 (fairly light) - 15 (hard) while following your exercise prescription.  We will make changes to your prescription with you as you progress through the program.   Duration: Be able to do 30 to 45 minutes of continuous aerobic exercise in addition to a 5 minute warm-up and a 5 minute cool-down routine.   Nutrition Goals: Your personal nutrition goals will be established when you do your nutrition analysis with the dietician.  The following are general nutrition guidelines to follow: Cholesterol < 200mg /day Sodium < 1500mg /day Fiber: Men over 50 yrs - 30 grams per  day  Personal Goals:  Personal Goals and Risk Factors at Admission - 07/17/20 1509      Core Components/Risk Factors/Patient Goals on Admission    Weight Management Yes;Weight Maintenance    Intervention Weight Management: Develop a combined nutrition and exercise program designed to reach desired caloric intake, while maintaining appropriate intake of nutrient and fiber, sodium and fats, and appropriate energy expenditure required for the weight goal.;Weight Management: Provide education and appropriate resources to help participant work on and attain dietary goals.    Admit Weight 182 lb (82.6 kg)    Goal Weight: Short Term 182 lb (82.6 kg)    Goal Weight: Long Term 182 lb (82.6 kg)    Expected Outcomes Short Term: Continue to assess and modify interventions until short term weight is achieved;Long Term: Adherence to nutrition and physical activity/exercise program aimed toward attainment of established weight goal;Weight Maintenance: Understanding of the daily nutrition guidelines, which includes 25-35% calories from fat, 7% or less cal from saturated fats, less than 200mg  cholesterol, less than 1.5gm of sodium, & 5 or more servings of fruits and vegetables daily    Hypertension Yes    Intervention Provide  education on lifestyle modifcations including regular physical activity/exercise, weight management, moderate sodium restriction and increased consumption of fresh fruit, vegetables, and low fat dairy, alcohol moderation, and smoking cessation.;Monitor prescription use compliance.    Expected Outcomes Short Term: Continued assessment and intervention until BP is < 140/42mm HG in hypertensive participants. < 130/6mm HG in hypertensive participants with diabetes, heart failure or chronic kidney disease.;Long Term: Maintenance of blood pressure at goal levels.    Lipids Yes    Intervention Provide education and support for participant on nutrition & aerobic/resistive exercise along with prescribed  medications to achieve LDL 70mg , HDL >40mg .    Expected Outcomes Short Term: Participant states understanding of desired cholesterol values and is compliant with medications prescribed. Participant is following exercise prescription and nutrition guidelines.;Long Term: Cholesterol controlled with medications as prescribed, with individualized exercise RX and with personalized nutrition plan. Value goals: LDL < 70mg , HDL > 40 mg.           Tobacco Use Initial Evaluation: Social History   Tobacco Use  Smoking Status Former Smoker  . Years: 10.00  . Types: Cigarettes  . Quit date: 27  . Years since quitting: 49.6  Smokeless Tobacco Former Systems developer  . Types: Chew  Tobacco Comment   Quit 1977    Exercise Goals and Review:  Exercise Goals    Row Name 07/21/20 1516             Exercise Goals   Increase Physical Activity Yes       Intervention Provide advice, education, support and counseling about physical activity/exercise needs.;Develop an individualized exercise prescription for aerobic and resistive training based on initial evaluation findings, risk stratification, comorbidities and participant's personal goals.       Expected Outcomes Short Term: Attend rehab on a regular basis to increase amount of physical activity.;Long Term: Add in home exercise to make exercise part of routine and to increase amount of physical activity.;Long Term: Exercising regularly at least 3-5 days a week.       Increase Strength and Stamina Yes       Intervention Provide advice, education, support and counseling about physical activity/exercise needs.;Develop an individualized exercise prescription for aerobic and resistive training based on initial evaluation findings, risk stratification, comorbidities and participant's personal goals.       Expected Outcomes Short Term: Increase workloads from initial exercise prescription for resistance, speed, and METs.;Short Term: Perform resistance training  exercises routinely during rehab and add in resistance training at home;Long Term: Improve cardiorespiratory fitness, muscular endurance and strength as measured by increased METs and functional capacity (6MWT)       Able to understand and use rate of perceived exertion (RPE) scale Yes       Intervention Provide education and explanation on how to use RPE scale       Expected Outcomes Short Term: Able to use RPE daily in rehab to express subjective intensity level;Long Term:  Able to use RPE to guide intensity level when exercising independently       Able to understand and use Dyspnea scale Yes       Intervention Provide education and explanation on how to use Dyspnea scale       Expected Outcomes Short Term: Able to use Dyspnea scale daily in rehab to express subjective sense of shortness of breath during exertion;Long Term: Able to use Dyspnea scale to guide intensity level when exercising independently       Knowledge and understanding of Target Heart Rate  Range (THRR) Yes       Intervention Provide education and explanation of THRR including how the numbers were predicted and where they are located for reference       Expected Outcomes Short Term: Able to state/look up THRR;Short Term: Able to use daily as guideline for intensity in rehab;Long Term: Able to use THRR to govern intensity when exercising independently       Able to check pulse independently Yes       Intervention Provide education and demonstration on how to check pulse in carotid and radial arteries.;Review the importance of being able to check your own pulse for safety during independent exercise       Expected Outcomes Short Term: Able to explain why pulse checking is important during independent exercise;Long Term: Able to check pulse independently and accurately       Understanding of Exercise Prescription Yes       Intervention Provide education, explanation, and written materials on patient's individual exercise prescription        Expected Outcomes Short Term: Able to explain program exercise prescription;Long Term: Able to explain home exercise prescription to exercise independently              Copy of goals given to participant.

## 2020-07-23 ENCOUNTER — Encounter: Payer: Medicare Other | Admitting: *Deleted

## 2020-07-23 ENCOUNTER — Other Ambulatory Visit: Payer: Self-pay

## 2020-07-23 DIAGNOSIS — Z952 Presence of prosthetic heart valve: Secondary | ICD-10-CM

## 2020-07-23 NOTE — Progress Notes (Signed)
Daily Session Note  Patient Details  Name: Kristopher Fernandez MRN: [id number] Date of Birth: [birth year] Referring Provider:     Cardiac Rehab from 07/21/2020 in [facility] Cardiac and Pulmonary Rehab  Referring Provider Aundra Dubin      Encounter Date: 07/23/2020  Check In:  Session Check In - 07/23/20 1339      Check-In   Supervising physician immediately available to respond to emergencies See telemetry face sheet for immediately available ER MD    Location [facility]-Cardiac & Pulmonary Rehab    Staff Present Renita Papa, RN BSN;Joseph Flavia Shipper;Heath Lark, RN, BSN, Jacklynn Bue, MS Exercise Physiologist    Virtual Visit No    Medication changes reported     No    Fall or balance concerns reported    No    Warm-up and Cool-down Performed on first and last piece of equipment    Resistance Training Performed Yes    VAD Patient? No    PAD/SET Patient? No      Pain Assessment   Currently in Pain? No/denies              Social History   Tobacco Use  Smoking Status Former Smoker  . Years: 10.00  . Types: Cigarettes  . Quit date: 35  . Years since quitting: 49.6  Smokeless Tobacco Former Systems developer  . Types: Chew  Tobacco Comment   Quit 1977    Goals Met:  Independence with exercise equipment Exercise tolerated well No report of cardiac concerns or symptoms Strength training completed today  Goals Unmet:  Not Applicable  Comments: First full day of exercise!  Patient was oriented to gym and equipment including functions, settings, policies, and procedures.  Patient's individual exercise prescription and treatment plan were reviewed.  All starting workloads were established based on the results of the 6 minute walk test done at initial orientation visit.  The plan for exercise progression was also introduced and progression will be customized based on patient's performance and goals.     Dr. Emily Filbert is Medical Director for [place] and  [facility].

## 2020-07-24 ENCOUNTER — Encounter: Payer: Medicare Other | Admitting: *Deleted

## 2020-07-24 ENCOUNTER — Other Ambulatory Visit: Payer: Self-pay

## 2020-07-24 DIAGNOSIS — Z952 Presence of prosthetic heart valve: Secondary | ICD-10-CM

## 2020-07-24 NOTE — Progress Notes (Signed)
Daily Session Note  Patient Details  Name: Kristopher Fernandez MRN: [id number] Date of Birth: March 21, 1932 Referring Provider:     Cardiac Rehab from 07/21/2020 in [facility] Cardiac and Pulmonary Rehab  Referring Provider Aundra Dubin      Encounter Date: 07/24/2020  Check In:  Session Check In - 07/24/20 1351      Check-In   Supervising physician immediately available to respond to emergencies See telemetry face sheet for immediately available ER MD    Location [facility]-Cardiac & Pulmonary Rehab    Staff Present Justin Mend RCP,RRT,BSRT;Siriah Treat Sherryll Burger, RN BSN;Laureen Owens Shark, BS, RRT, CPFT    Virtual Visit No    Medication changes reported     No    Fall or balance concerns reported    No    Warm-up and Cool-down Performed on first and last piece of equipment    Resistance Training Performed Yes    VAD Patient? No    PAD/SET Patient? No      Pain Assessment   Currently in Pain? No/denies              Social History   Tobacco Use  Smoking Status Former Smoker  . Years: 10.00  . Types: Cigarettes  . Quit date: 15  . Years since quitting: 49.6  Smokeless Tobacco Former Systems developer  . Types: Chew  Tobacco Comment   Quit 1977    Goals Met:  Independence with exercise equipment Exercise tolerated well No report of cardiac concerns or symptoms Strength training completed today  Goals Unmet:  Not Applicable  Comments: Pt able to follow exercise prescription today without complaint.  Will continue to monitor for progression.    Dr. Emily Filbert is Medical Director for [place] and [facility].

## 2020-07-27 ENCOUNTER — Encounter (HOSPITAL_COMMUNITY): Payer: Self-pay

## 2020-07-28 ENCOUNTER — Encounter: Payer: Medicare Other | Admitting: *Deleted

## 2020-07-28 ENCOUNTER — Other Ambulatory Visit: Payer: Self-pay

## 2020-07-28 DIAGNOSIS — Z952 Presence of prosthetic heart valve: Secondary | ICD-10-CM

## 2020-07-28 NOTE — Progress Notes (Signed)
Daily Session Note  Patient Details  Name: QUY LOTTS MRN: [id number] Date of Birth: 1932-09-22 Referring Provider:     Cardiac Rehab from 07/21/2020 in [facility] Cardiac and Pulmonary Rehab  Referring Provider Aundra Dubin      Encounter Date: 07/28/2020  Check In:  Session Check In - 07/28/20 1339      Check-In   Supervising physician immediately available to respond to emergencies See telemetry face sheet for immediately available ER MD    Location [facility]-Cardiac & Pulmonary Rehab    Staff Present Renita Papa, RN Margurite Auerbach, MS Exercise Physiologist;Jessica Luan Pulling, MA, RCEP, CCRP, CCET    Virtual Visit No    Medication changes reported     No    Fall or balance concerns reported    No    Warm-up and Cool-down Performed on first and last piece of equipment    Resistance Training Performed Yes    VAD Patient? No    PAD/SET Patient? No      Pain Assessment   Currently in Pain? No/denies              Social History   Tobacco Use  Smoking Status Former Smoker  . Years: 10.00  . Types: Cigarettes  . Quit date: 48  . Years since quitting: 49.6  Smokeless Tobacco Former Systems developer  . Types: Chew  Tobacco Comment   Quit 1977    Goals Met:  Independence with exercise equipment Exercise tolerated well No report of cardiac concerns or symptoms Strength training completed today  Goals Unmet:  Not Applicable  Comments: Pt able to follow exercise prescription today without complaint.  Will continue to monitor for progression.    Dr. Emily Filbert is Medical Director for [place] and [facility].

## 2020-07-30 ENCOUNTER — Encounter: Payer: Self-pay | Admitting: *Deleted

## 2020-07-30 ENCOUNTER — Other Ambulatory Visit: Payer: Self-pay

## 2020-07-30 ENCOUNTER — Encounter: Payer: Medicare Other | Admitting: *Deleted

## 2020-07-30 DIAGNOSIS — Z952 Presence of prosthetic heart valve: Secondary | ICD-10-CM | POA: Diagnosis not present

## 2020-07-30 NOTE — Progress Notes (Signed)
Daily Session Note  Patient Details  Name: DAEGEN BERROCAL MRN: [id number] Date of Birth: [birth year] Referring Provider:     Cardiac Rehab from 07/21/2020 in [facility] Cardiac and Pulmonary Rehab  Referring Provider Aundra Dubin      Encounter Date: 07/30/2020  Check In:  Session Check In - 07/30/20 1346      Check-In   Supervising physician immediately available to respond to emergencies See telemetry face sheet for immediately available ER MD    Location [facility]-Cardiac & Pulmonary Rehab    Staff Present Renita Papa, RN BSN;Joseph Lou Miner, [place] Exercise Physiologist    Virtual Visit No    Medication changes reported     No    Fall or balance concerns reported    No    Warm-up and Cool-down Performed on first and last piece of equipment    Resistance Training Performed Yes    VAD Patient? No    PAD/SET Patient? No      Pain Assessment   Currently in Pain? No/denies              Social History   Tobacco Use  Smoking Status Former Smoker   Years: 10.00   Types: Cigarettes   Quit date: 1972   Years since quitting: 49.6  Smokeless Tobacco Former Systems developer   Types: Chew  Tobacco Comment   Quit 1977    Goals Met:  Independence with exercise equipment Exercise tolerated well No report of cardiac concerns or symptoms Strength training completed today  Goals Unmet:  Not Applicable  Comments: Pt able to follow exercise prescription today without complaint.  Will continue to monitor for progression.    Dr. Emily Filbert is Medical Director for [place] and [facility].

## 2020-07-30 NOTE — Progress Notes (Signed)
Cardiac Individual Treatment Plan  Patient Details  Name: Kristopher Fernandez MRN: [id number] Date of Birth: June 08, 1932 Referring Provider:     Cardiac Rehab from 07/21/2020 in [facility] Cardiac and Pulmonary Rehab  Referring Provider Aundra Dubin      Initial Encounter Date:    Cardiac Rehab from 07/21/2020 in [facility] Cardiac and Pulmonary Rehab  Date 07/21/20      Visit Diagnosis: S/P TAVR (transcatheter aortic valve replacement)  Patient's Home Medications on Admission:  Current Outpatient Medications:  .  albuterol (VENTOLIN HFA) 108 (90 Base) MCG/ACT inhaler, Inhale 1-2 puffs into the lungs every 6 (six) hours as needed for wheezing or shortness of breath., Disp: , Rfl:  .  aspirin 81 MG chewable tablet, Chew 1 tablet (81 mg total) by mouth daily., Disp: , Rfl:  .  atorvastatin (LIPITOR) 80 MG tablet, Take 80 mg by mouth daily at 6 PM. , Disp: , Rfl:  .  carvedilol (COREG) 6.25 MG tablet, Take 1 tablet (6.25 mg total) by mouth 2 (two) times daily with a meal., Disp: 60 tablet, Rfl: 5 .  clopidogrel (PLAVIX) 75 MG tablet, Take 1 tablet (75 mg total) by mouth daily., Disp: 90 tablet, Rfl: 1 .  furosemide (LASIX) 20 MG tablet, Take 1 tablet (20 mg total) by mouth as needed (weight. Take one tablet if weight increases by 3 pounds overnight or 5 pounds in one week.)., Disp: 30 tablet, Rfl: 0 .  ipratropium-albuterol (DUONEB) 0.5-2.5 (3) MG/3ML SOLN, Take 3 mLs by nebulization every 6 (six) hours as needed. (Patient taking differently: Take 3 mLs by nebulization every 6 (six) hours as needed (wheezing/shortness of breath.). ), Disp: 360 mL, Rfl: 1 .  losartan (COZAAR) 25 MG tablet, Take 0.5 tablets (12.5 mg total) by mouth daily., Disp: 15 tablet, Rfl: 3 .  ranolazine (RANEXA) 500 MG 12 hr tablet, Take 1 tablet (500 mg total) by mouth 2 (two) times daily., Disp: 60 tablet, Rfl: 6 .  spironolactone (ALDACTONE) 25 MG tablet, Take 0.5 tablets (12.5 mg total) by mouth at bedtime., Disp: 45 tablet, Rfl: 3 .   venlafaxine XR (EFFEXOR-XR) 75 MG 24 hr capsule, Take 1 capsule (75 mg total) by mouth daily with breakfast. (Patient taking differently: Take 75 mg by mouth daily as needed (depression). ), Disp: 30 capsule, Rfl: 0  Past Medical History: Past Medical History:  Diagnosis Date  . Anginal pain ([place])   . Aortic stenosis   . Aortic stenosis   . Basal cell carcinoma, ear 03/31/2015  . Bradycardia 07/03/2014  . CHF (congestive heart failure) ([place])   . DDD (degenerative disc disease), lumbar 08/06/2015  . H/O cardiac catheterization 07/03/2014   Overview:  Cypher stent proximal and distal RCA 07/18/05 and TAXUS stent mid LAD 07/20/05 at [facility]  . HTN (hypertension) 07/03/2014  . Hyperlipidemia 07/03/2014  . Ischemic cardiomyopathy   . Lumbosacral radiculopathy at S1 08/22/2015  . MI (myocardial infarction) ([place]) 07/03/2014   Overview:  Mi 07/17/05  . Myocardial infarction ([place])   . Normocytic normochromic anemia 09/14/2015  . Pacemaker 07/03/2014   Overview:  Dual chamber pacemaker 08/11/11  . Pancreatic mass    not hypermetabolic on PET-CT imaging  . Prostate cancer ([place])    metastatic  . 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 Shortness of breath dyspnea   . SOB (shortness of breath) on exertion 05/13/2016  Tobacco Use: Social History   Tobacco Use  Smoking Status Former Smoker  . Years: 10.00  . Types: Cigarettes  . Quit date: 9  . Years since quitting: 49.6  Smokeless Tobacco Former Systems developer  . Types: Chew  Tobacco Comment   Quit 1977    Labs: Recent Review Flowsheet Data    Labs for ITP Cardiac and Pulmonary Rehab Latest Ref Rng & Units 05/22/2020 06/13/2020 06/17/2020 06/17/2020 06/17/2020   Cholestrol 0 - 200 mg/dL - - - - -   LDLCALC 0 - 99 mg/dL - - - - -   HDL >40 mg/dL - - - - -   Trlycerides <150 mg/dL - - - - -   Hemoglobin A1c 4.8 - 5.6 % - 5.6 - - -   PHART 7.35  - 7.45 - 7.425 7.324(L) - -   PCO2ART 32 - 48 mmHg - 37.7 53.1(H) - -   HCO3 20.0 - 28.0 mmol/L 24.7 24.3 27.6 - -   TCO2 22 - 32 mmol/L 26 - _0 ACIDBASEDEF 0.0 - 2.0 mmol/L 1.0 - - - -   O2SAT % 64.0 94.3 99.0 - -       Exercise Target Goals: Exercise Program Goal: Individual exercise prescription set using results from initial 6 min walk test and THRR while considering  patient's activity barriers and safety.   Exercise Prescription Goal: Initial exercise prescription builds to 30-45 minutes a day of aerobic activity, 2-3 days per week.  Home exercise guidelines will be given to patient during program as part of exercise prescription that the participant will acknowledge.   Education: Aerobic Exercise & Resistance Training: - Gives group verbal and written instruction on the various components of exercise. Focuses on aerobic and resistive training programs and the benefits of this training and how to safely progress through these programs..   Education: Exercise & Equipment Safety: - Individual verbal instruction and demonstration of equipment use and safety with use of the equipment.   Cardiac Rehab from 07/21/2020 in [facility] Cardiac and Pulmonary Rehab  Date 07/21/20  Educator AS  Instruction Review Code 1- Verbalizes Understanding      Education: Exercise Physiology & General Exercise Guidelines: - Group verbal and written instruction with models to review the exercise physiology of the cardiovascular system and associated critical values. Provides general exercise guidelines with specific guidelines to those with heart or lung disease.    Education: Flexibility, Balance, Mind/Body Relaxation: Provides group verbal/written instruction on the benefits of flexibility and balance training, including mind/body exercise modes such as yoga, pilates and tai chi.  Demonstration and skill practice provided.   Activity Barriers & Risk Stratification:  Activity Barriers & Cardiac  Risk Stratification - 07/17/20 1506      Activity Barriers & Cardiac Risk Stratification   Activity Barriers Shortness of Breath;Deconditioning   Uses a cane   Cardiac Risk Stratification Low           6 Minute Walk:  6 Minute Walk    Row Name 07/21/20 1505         6 Minute Walk   Phase Initial     Distance 655 feet     Walk Time 6 minutes     # of Rest Breaks 0     MPH 1.24     METS 1     RPE 13     Perceived Dyspnea  2     VO2 Peak 3.5     Symptoms  No     Resting HR 77 bpm     Resting BP 112/66     Resting Oxygen Saturation  94 %     Exercise Oxygen Saturation  during 6 min walk 88 %     Max Ex. HR 91 bpm     Max Ex. BP 138/66     2 Minute Post BP 118/68            Oxygen Initial Assessment:   Oxygen Re-Evaluation:   Oxygen Discharge (Final Oxygen Re-Evaluation):   Initial Exercise Prescription:  Initial Exercise Prescription - 07/21/20 1500      Date of Initial Exercise RX and Referring Provider   Date 07/21/20    Referring Provider Aundra Dubin      Treadmill   MPH 1    Grade 0    Minutes 15    METs 1.77      Recumbant Bike   Level 1    RPM 60    Minutes 15    METs 1      NuStep   Level 1    SPM 80    Minutes 15    METs 1      Prescription Details   Frequency (times per week) 3    Duration Progress to 30 minutes of continuous aerobic without signs/symptoms of physical distress      Intensity   THRR 40-80% of Max Heartrate 100-123    Ratings of Perceived Exertion 11-13    Perceived Dyspnea 0-4      Resistance Training   Training Prescription Yes    Weight 3 lb    Reps 10-15           Perform Capillary Blood Glucose checks as needed.  Exercise Prescription Changes:  Exercise Prescription Changes    Row Name 07/21/20 1500             Response to Exercise   Blood Pressure (Admit) 112/66       Blood Pressure (Exercise) 138/66       Blood Pressure (Exit) 118/68       Heart Rate (Admit) 77 bpm       Heart Rate (Exercise)  91 bpm       Heart Rate (Exit) 82 bpm       Oxygen Saturation (Admit) 94 %       Oxygen Saturation (Exercise) 88 %       Oxygen Saturation (Exit) 94 %       Rating of Perceived Exertion (Exercise) 13       Perceived Dyspnea (Exercise) 2       Symptoms none              Exercise Comments:   Exercise Goals and Review:  Exercise Goals    Row Name 07/21/20 1516             Exercise Goals   Increase Physical Activity Yes       Intervention Provide advice, education, support and counseling about physical activity/exercise needs.;Develop an individualized exercise prescription for aerobic and resistive training based on initial evaluation findings, risk stratification, comorbidities and participant's personal goals.       Expected Outcomes Short Term: Attend rehab on a regular basis to increase amount of physical activity.;Long Term: Add in home exercise to make exercise part of routine and to increase amount of physical activity.;Long Term: Exercising regularly at least 3-5 days a week.       Increase Strength and  Stamina Yes       Intervention Provide advice, education, support and counseling about physical activity/exercise needs.;Develop an individualized exercise prescription for aerobic and resistive training based on initial evaluation findings, risk stratification, comorbidities and participant's personal goals.       Expected Outcomes Short Term: Increase workloads from initial exercise prescription for resistance, speed, and METs.;Short Term: Perform resistance training exercises routinely during rehab and add in resistance training at home;Long Term: Improve cardiorespiratory fitness, muscular endurance and strength as measured by increased METs and functional capacity (6MWT)       Able to understand and use rate of perceived exertion (RPE) scale Yes       Intervention Provide education and explanation on how to use RPE scale       Expected Outcomes Short Term: Able to use RPE  daily in rehab to express subjective intensity level;Long Term:  Able to use RPE to guide intensity level when exercising independently       Able to understand and use Dyspnea scale Yes       Intervention Provide education and explanation on how to use Dyspnea scale       Expected Outcomes Short Term: Able to use Dyspnea scale daily in rehab to express subjective sense of shortness of breath during exertion;Long Term: Able to use Dyspnea scale to guide intensity level when exercising independently       Knowledge and understanding of Target Heart Rate Range (THRR) Yes       Intervention Provide education and explanation of THRR including how the numbers were predicted and where they are located for reference       Expected Outcomes Short Term: Able to state/look up THRR;Short Term: Able to use daily as guideline for intensity in rehab;Long Term: Able to use THRR to govern intensity when exercising independently       Able to check pulse independently Yes       Intervention Provide education and demonstration on how to check pulse in carotid and radial arteries.;Review the importance of being able to check your own pulse for safety during independent exercise       Expected Outcomes Short Term: Able to explain why pulse checking is important during independent exercise;Long Term: Able to check pulse independently and accurately       Understanding of Exercise Prescription Yes       Intervention Provide education, explanation, and written materials on patient's individual exercise prescription       Expected Outcomes Short Term: Able to explain program exercise prescription;Long Term: Able to explain home exercise prescription to exercise independently              Exercise Goals Re-Evaluation :  Exercise Goals Re-Evaluation    [place] Name 07/23/20 1341             Exercise Goal Re-Evaluation   Exercise Goals Review Increase Physical Activity;Able to understand and use rate of perceived  exertion (RPE) scale;Knowledge and understanding of Target Heart Rate Range (THRR);Understanding of Exercise Prescription;Increase Strength and Stamina;Able to check pulse independently       Comments Reviewed RPE and dyspnea scales, THR and program prescription with pt today.  Pt voiced understanding and was given a copy of goals to take home.       Expected Outcomes Short: Use RPE daily to regulate intensity. Long: Follow program prescription in THR.              Discharge Exercise Prescription (Final Exercise Prescription  Changes):  Exercise Prescription Changes - 07/21/20 1500      Response to Exercise   Blood Pressure (Admit) 112/66    Blood Pressure (Exercise) 138/66    Blood Pressure (Exit) 118/68    Heart Rate (Admit) 77 bpm    Heart Rate (Exercise) 91 bpm    Heart Rate (Exit) 82 bpm    Oxygen Saturation (Admit) 94 %    Oxygen Saturation (Exercise) 88 %    Oxygen Saturation (Exit) 94 %    Rating of Perceived Exertion (Exercise) 13    Perceived Dyspnea (Exercise) 2    Symptoms none           Nutrition:  Target Goals: Understanding of nutrition guidelines, daily intake of sodium <1594m, cholesterol <2081m calories 30% from fat and 7% or less from saturated fats, daily to have 5 or more servings of fruits and vegetables.  Education: Controlling Sodium/Reading Food Labels -Group verbal and written material supporting the discussion of sodium use in heart healthy nutrition. Review and explanation with models, verbal and written materials for utilization of the food label.   Education: General Nutrition Guidelines/Fats and Fiber: -Group instruction provided by verbal, written material, models and posters to present the general guidelines for heart healthy nutrition. Gives an explanation and review of dietary fats and fiber.   Biometrics:    Nutrition Therapy Plan and Nutrition Goals:  Nutrition Therapy & Goals - 07/21/20 1403      Nutrition Therapy   Diet High  calorie, High protein pulmonary MNT diet.    Protein (specify units) 80-85g    Fiber 30 grams    Whole Grain Foods 3 servings    Saturated Fats 12 max. grams    Fruits and Vegetables 5 servings/day    Sodium 1.5 grams      Personal Nutrition Goals   Nutrition Goal ST: increase protein at meals LT: gain strength    Comments Pt reports loving eggs and cereal (raisin bran) - whole milk or waffles or pancake. L: sandwich (tomato right now) (white bread) or corn on the cobb or some beans (pintos) D: pork and beans. Discussed Pulmonary Nutrition, heart healthy nutrition, and high protein nutrition. His COPD is newly diagnosed and he is in remission of cancer - treatments ended in May. Pt reports not getting strength back.      Intervention Plan   Intervention Prescribe, educate and counsel regarding individualized specific dietary modifications aiming towards targeted core components such as weight, hypertension, lipid management, diabetes, heart failure and other comorbidities.;Nutrition handout(s) given to patient.    Expected Outcomes Short Term Goal: Understand basic principles of dietary content, such as calories, fat, sodium, cholesterol and nutrients.;Long Term Goal: Adherence to prescribed nutrition plan.;Short Term Goal: A plan has been developed with personal nutrition goals set during dietitian appointment.           Nutrition Assessments:  Nutrition Assessments - 07/21/20 1526      MEDFICTS Scores   Pre Score 52           MEDIFICTS Score Key:          ?70 Need to make dietary changes          40-70 Heart Healthy Diet         ? 40 Therapeutic Level Cholesterol Diet  Nutrition Goals Re-Evaluation:   Nutrition Goals Discharge (Final Nutrition Goals Re-Evaluation):   Psychosocial: Target Goals: Acknowledge presence or absence of significant depression and/or stress, maximize coping skills, provide positive support  system. Participant is able to verbalize types and ability  to use techniques and skills needed for reducing stress and depression.   Education: Depression - Provides group verbal and written instruction on the correlation between heart/lung disease and depressed mood, treatment options, and the stigmas associated with seeking treatment.   Education: Sleep Hygiene -Provides group verbal and written instruction about how sleep can affect your health.  Define sleep hygiene, discuss sleep cycles and impact of sleep habits. Review good sleep hygiene tips.     Education: Stress and Anxiety: - Provides group verbal and written instruction about the health risks of elevated stress and causes of high stress.  Discuss the correlation between heart/lung disease and anxiety and treatment options. Review healthy ways to manage with stress and anxiety.    Initial Review & Psychosocial Screening:  Initial Psych Review & Screening - 07/17/20 1512      Initial Review   Current issues with Current Stress Concerns    Source of Stress Concerns Chronic Illness    Comments Just finished cancer treament May   still weak SOB,  then TAVR    wants to feel stronger   improve SOB      Family Dynamics   Good Support System? Yes   Daughter  [name]   Concerns Recent loss of significant other    Comments Wife of almost 24 years passed away in 03-06-19      Barriers   Psychosocial barriers to participate in program There are no identifiable barriers or psychosocial needs.      Screening Interventions   Interventions Encouraged to exercise;Provide feedback about the scores to participant;To provide support and resources with identified psychosocial needs    Expected Outcomes Short Term goal: Utilizing psychosocial counselor, staff and physician to assist with identification of specific Stressors or current issues interfering with healing process. Setting desired goal for each stressor or current issue identified.;Long Term Goal: Stressors or current issues are controlled or  eliminated.;Short Term goal: Identification and review with participant of any Quality of Life or Depression concerns found by scoring the questionnaire.;Long Term goal: The participant improves quality of Life and PHQ9 Scores as seen by post scores and/or verbalization of changes           Quality of Life Scores:   Quality of Life - 07/21/20 1519      Quality of Life   Select Quality of Life      Quality of Life Scores   Health/Function Pre 26.14 %    Socioeconomic Pre 30 %    Psych/Spiritual Pre 28.29 %    Family Pre 30 %    GLOBAL Pre 28 %          Scores of 19 and below usually indicate a poorer quality of life in these areas.  A difference of  2-3 points is a clinically meaningful difference.  A difference of 2-3 points in the total score of the Quality of Life Index has been associated with significant improvement in overall quality of life, self-image, physical symptoms, and general health in studies assessing change in quality of life.  PHQ-9: Recent Review Flowsheet Data    Depression screen [facility] 2/9 07/21/2020 07/11/2020   Decreased Interest 0 0   Down, Depressed, Hopeless 0 0   PHQ - 2 Score 0 0   Altered sleeping 3  -   Tired, decreased energy 3  -   Change in appetite 0 -   Feeling bad or failure about  yourself  0 -   Trouble concentrating 0 -   Moving slowly or fidgety/restless 0 -   Suicidal thoughts 0 -   PHQ-9 Score 6 -     Interpretation of Total Score  Total Score Depression Severity:  1-4 = Minimal depression, 5-9 = Mild depression, 10-14 = Moderate depression, 15-19 = Moderately severe depression, 20-27 = Severe depression   Psychosocial Evaluation and Intervention:  Psychosocial Evaluation - 07/17/20 1519      Psychosocial Evaluation & Interventions   Interventions Encouraged to exercise with the program and follow exercise prescription    Comments Jiovanny has no barriers to attending the prgram. He hopes to impove his energy levels and decrease his  SOB.  He recently completed Prostate Cancer treatments and current TAVR.  He lives alone as his wife of almost 34 years passed away in 02-23-19. His daughter [name] checks on him several times a day. He should do well in the program.    Expected Outcomes STG: Herve attends all scheduled sessions  LTG: Raife sees improvement in his energy and SOB symptoms. He is able to utilize the exercise and education learned in program to keep his progress moving forward.    Continue Psychosocial Services  Follow up required by staff           Psychosocial Re-Evaluation:   Psychosocial Discharge (Final Psychosocial Re-Evaluation):   Vocational Rehabilitation: Provide vocational rehab assistance to qualifying candidates.   Vocational Rehab Evaluation & Intervention:  Vocational Rehab - 07/17/20 1514      Initial Vocational Rehab Evaluation & Intervention   Assessment shows need for Vocational Rehabilitation No           Education: Education Goals: Education classes will be provided on a variety of topics geared toward better understanding of heart health and risk factor modification. Participant will state understanding/return demonstration of topics presented as noted by education test scores.  Learning Barriers/Preferences:  Learning Barriers/Preferences - 07/17/20 1519      Learning Barriers/Preferences   Learning Barriers None    Learning Preferences None           General Cardiac Education Topics:  AED/CPR: - Group verbal and written instruction with the use of models to demonstrate the basic use of the AED with the basic ABC's of resuscitation.   Anatomy & Physiology of the Heart: - Group verbal and written instruction and models provide basic cardiac anatomy and physiology, with the coronary electrical and arterial systems. Review of Valvular disease and Heart Failure   Cardiac Procedures: - Group verbal and written instruction to review commonly prescribed medications for  heart disease. Reviews the medication, class of the drug, and side effects. Includes the steps to properly store meds and maintain the prescription regimen. (beta blockers and nitrates)   Cardiac Medications I: - Group verbal and written instruction to review commonly prescribed medications for heart disease. Reviews the medication, class of the drug, and side effects. Includes the steps to properly store meds and maintain the prescription regimen.   Cardiac Medications II: -Group verbal and written instruction to review commonly prescribed medications for heart disease. Reviews the medication, class of the drug, and side effects. (all other drug classes)    Go Sex-Intimacy & Heart Disease, Get SMART - Goal Setting: - Group verbal and written instruction through game format to discuss heart disease and the return to sexual intimacy. Provides group verbal and written material to discuss and apply goal setting through the application of the S.M.A.R.T.  Method.   Other Matters of the Heart: - Provides group verbal, written materials and models to describe Stable Angina and Peripheral Artery. Includes description of the disease process and treatment options available to the cardiac patient.   Infection Prevention: - Provides verbal and written material to individual with discussion of infection control including proper hand washing and proper equipment cleaning during exercise session.   Cardiac Rehab from 07/21/2020 in [facility] Cardiac and Pulmonary Rehab  Date 07/21/20  Educator AS  Instruction Review Code 1- Verbalizes Understanding      Falls Prevention: - Provides verbal and written material to individual with discussion of falls prevention and safety.   Cardiac Rehab from 07/21/2020 in [facility] Cardiac and Pulmonary Rehab  Date 07/21/20  Educator AS  Instruction Review Code 1- Verbalizes Understanding      Other: -Provides group and verbal instruction on various topics (see  comments)   Knowledge Questionnaire Score:  Knowledge Questionnaire Score - 07/21/20 1523      Knowledge Questionnaire Score   Pre Score 21/26 heart rate nutrition           Core Components/Risk Factors/Patient Goals at Admission:  Personal Goals and Risk Factors at Admission - 07/17/20 1509      Core Components/Risk Factors/Patient Goals on Admission    Weight Management Yes;Weight Maintenance    Intervention Weight Management: Develop a combined nutrition and exercise program designed to reach desired caloric intake, while maintaining appropriate intake of nutrient and fiber, sodium and fats, and appropriate energy expenditure required for the weight goal.;Weight Management: Provide education and appropriate resources to help participant work on and attain dietary goals.    Admit Weight 182 lb (82.6 kg)    Goal Weight: Short Term 182 lb (82.6 kg)    Goal Weight: Long Term 182 lb (82.6 kg)    Expected Outcomes Short Term: Continue to assess and modify interventions until short term weight is achieved;Long Term: Adherence to nutrition and physical activity/exercise program aimed toward attainment of established weight goal;Weight Maintenance: Understanding of the daily nutrition guidelines, which includes 25-35% calories from fat, 7% or less cal from saturated fats, less than 278m cholesterol, less than 1.5gm of sodium, & 5 or more servings of fruits and vegetables daily    Hypertension Yes    Intervention Provide education on lifestyle modifcations including regular physical activity/exercise, weight management, moderate sodium restriction and increased consumption of fresh fruit, vegetables, and low fat dairy, alcohol moderation, and smoking cessation.;Monitor prescription use compliance.    Expected Outcomes Short Term: Continued assessment and intervention until BP is < 140/960mHG in hypertensive participants. < 130/8052mG in hypertensive participants with diabetes, heart failure or  chronic kidney disease.;Long Term: Maintenance of blood pressure at goal levels.    Lipids Yes    Intervention Provide education and support for participant on nutrition & aerobic/resistive exercise along with prescribed medications to achieve LDL <21m37mDL >40mg28m Expected Outcomes Short Term: Participant states understanding of desired cholesterol values and is compliant with medications prescribed. Participant is following exercise prescription and nutrition guidelines.;Long Term: Cholesterol controlled with medications as prescribed, with individualized exercise RX and with personalized nutrition plan. Value goals: LDL < 21mg,21m > 40 mg.           Education:Diabetes - Individual verbal and written instruction to review signs/symptoms of diabetes, desired ranges of glucose level fasting, after meals and with exercise. Acknowledge that pre and post exercise glucose checks will be done for 3 sessions  at entry of program.   Education: Know Your Numbers and Risk Factors: -Group verbal and written instruction about important numbers in your health.  Discussion of what are risk factors and how they play a role in the disease process.  Review of Cholesterol, Blood Pressure, Diabetes, and BMI and the role they play in your overall health.   Core Components/Risk Factors/Patient Goals Review:    Core Components/Risk Factors/Patient Goals at Discharge (Final Review):    ITP Comments:  ITP Comments    Row Name 07/17/20 1523 07/21/20 1536 07/23/20 1340 07/30/20 0726     ITP Comments Virtual orientation call completed today. he has an appointment on Date: 07/21/2020 for EP eval and gym Orientation.  Documentation of diagnosis can be found in [facility] Date: 06/17/2020. Completed 6MWT and gym orientation. Initial ITP created and sent for review to Dr. Emily Filbert, Medical Director. First full day of exercise!  Patient was oriented to gym and equipment including functions, settings, policies, and  procedures.  Patient's individual exercise prescription and treatment plan were reviewed.  All starting workloads were established based on the results of the 6 minute walk test done at initial orientation visit.  The plan for exercise progression was also introduced and progression will be customized based on patient's performance and goals. 30 Day review completed. Medical Director ITP review done, changes made as directed, and signed approval by Medical Director.           Comments:

## 2020-07-31 ENCOUNTER — Other Ambulatory Visit: Payer: Self-pay

## 2020-07-31 ENCOUNTER — Encounter: Payer: Medicare Other | Admitting: *Deleted

## 2020-07-31 DIAGNOSIS — Z952 Presence of prosthetic heart valve: Secondary | ICD-10-CM

## 2020-07-31 NOTE — Progress Notes (Signed)
Daily Session Note  Patient Details  Name: Kristopher Fernandez MRN: [id number] Date of Birth: [birth year] Referring Provider:     Cardiac Rehab from 07/21/2020 in [facility] Cardiac and Pulmonary Rehab  Referring Provider Aundra Dubin      Encounter Date: 07/31/2020  Check In:  Session Check In - 07/31/20 1340      Check-In   Supervising physician immediately available to respond to emergencies See telemetry face sheet for immediately available ER MD    Location [facility]-Cardiac & Pulmonary Rehab    Staff Present Renita Papa, RN BSN;Joseph [address] [place], [place], RCEP, CCRP, CCET    Virtual Visit No    Medication changes reported     No    Fall or balance concerns reported    No    Warm-up and Cool-down Performed on first and last piece of equipment    Resistance Training Performed Yes    VAD Patient? No    PAD/SET Patient? No      Pain Assessment   Currently in Pain? No/denies              Social History   Tobacco Use  Smoking Status Former Smoker  . Years: 10.00  . Types: Cigarettes  . Quit date: 65  . Years since quitting: 49.6  Smokeless Tobacco Former Systems developer  . Types: Chew  Tobacco Comment   Quit 1977    Goals Met:  Independence with exercise equipment Exercise tolerated well No report of cardiac concerns or symptoms Strength training completed today  Goals Unmet:  Not Applicable  Comments: Pt able to follow exercise prescription today without complaint.  Will continue to monitor for progression.    Dr. Emily Filbert is Medical Director for [place] and [facility].

## 2020-08-04 ENCOUNTER — Encounter: Payer: Medicare Other | Admitting: *Deleted

## 2020-08-04 ENCOUNTER — Inpatient Hospital Stay (HOSPITAL_COMMUNITY)
Admission: RE | Admit: 2020-08-04 | Discharge: 2020-08-04 | Disposition: A | Discharge status: Home / Self Care | Payer: Medicare Other | Source: Ambulatory Visit

## 2020-08-04 ENCOUNTER — Other Ambulatory Visit: Payer: Self-pay

## 2020-08-04 DIAGNOSIS — Z952 Presence of prosthetic heart valve: Secondary | ICD-10-CM | POA: Diagnosis not present

## 2020-08-04 NOTE — Progress Notes (Signed)
Daily Session Note  Patient Details  Name: Kristopher Fernandez MRN: [id number] Date of Birth: 1932/01/15 Referring Provider:     Cardiac Rehab from 07/21/2020 in [facility] Cardiac and Pulmonary Rehab  Referring Provider Aundra Dubin      Encounter Date: 08/04/2020  Check In:  Session Check In - 08/04/20 1405      Check-In   Supervising physician immediately available to respond to emergencies See telemetry face sheet for immediately available ER MD    Location [facility]-Cardiac & Pulmonary Rehab    Staff Present Heath Lark, RN, BSN, Laveda Norman, BS, ACSM CEP, Exercise Physiologist;Kara Eliezer Bottom, MS Exercise Physiologist    Virtual Visit No    Medication changes reported     No    Fall or balance concerns reported    No    Warm-up and Cool-down Performed on first and last piece of equipment    Resistance Training Performed Yes    VAD Patient? No    PAD/SET Patient? No      Pain Assessment   Currently in Pain? No/denies              Social History   Tobacco Use  Smoking Status Former Smoker  . Years: 10.00  . Types: Cigarettes  . Quit date: 42  . Years since quitting: 49.6  Smokeless Tobacco Former Systems developer  . Types: Chew  Tobacco Comment   Quit 1977    Goals Met:  Independence with exercise equipment Exercise tolerated well No report of cardiac concerns or symptoms  Goals Unmet:  Not Applicable  Comments: Pt able to follow exercise prescription today without complaint.  Will continue to monitor for progression.    Dr. Emily Filbert is Medical Director for [place] and [facility].

## 2020-08-06 ENCOUNTER — Encounter: Payer: Medicare Other | Admitting: *Deleted

## 2020-08-06 ENCOUNTER — Other Ambulatory Visit: Payer: Self-pay

## 2020-08-06 ENCOUNTER — Other Ambulatory Visit (HOSPITAL_COMMUNITY): Payer: Self-pay | Admitting: Cardiology

## 2020-08-06 DIAGNOSIS — Z952 Presence of prosthetic heart valve: Secondary | ICD-10-CM | POA: Diagnosis not present

## 2020-08-06 NOTE — Progress Notes (Signed)
Daily Session Note  Patient Details  Name: EZEKIAH MASSIE MRN: [id number] Date of Birth: 1932/01/10 Referring Provider:     Cardiac Rehab from 07/21/2020 in [facility] Cardiac and Pulmonary Rehab  Referring Provider Aundra Dubin      Encounter Date: 08/06/2020  Check In:  Session Check In - 08/06/20 1518      Check-In   Supervising physician immediately available to respond to emergencies See telemetry face sheet for immediately available ER MD    Location [facility]-Cardiac & Pulmonary Rehab    Staff Present Nyoka Cowden, RN, BSN, MA;Joseph Hood [place], [place] Exercise Physiologist    Virtual Visit No    Medication changes reported     No    Fall or balance concerns reported    No    Warm-up and Cool-down Performed on first and last piece of equipment    Resistance Training Performed Yes    VAD Patient? No    PAD/SET Patient? No      Pain Assessment   Currently in Pain? No/denies              Social History   Tobacco Use  Smoking Status Former Smoker  . Years: 10.00  . Types: Cigarettes  . Quit date: 85  . Years since quitting: 49.6  Smokeless Tobacco Former Systems developer  . Types: Chew  Tobacco Comment   Quit 1977    Goals Met:  Independence with exercise equipment Exercise tolerated well No report of cardiac concerns or symptoms Strength training completed today  Goals Unmet:  Not Applicable  Comments: Pt able to follow exercise prescription today without complaint.  Will continue to monitor for progression.   Dr. Emily Filbert is Medical Director for [place] and [facility].

## 2020-08-07 ENCOUNTER — Other Ambulatory Visit: Payer: Self-pay

## 2020-08-07 ENCOUNTER — Encounter: Payer: Medicare Other | Admitting: *Deleted

## 2020-08-07 DIAGNOSIS — Z952 Presence of prosthetic heart valve: Secondary | ICD-10-CM

## 2020-08-07 NOTE — Progress Notes (Signed)
Daily Session Note  Patient Details  Name: RAVON MORTELLARO MRN: [id number] Date of Birth: [birth year] Referring Provider:     Cardiac Rehab from 07/21/2020 in [facility] Cardiac and Pulmonary Rehab  Referring Provider Aundra Dubin      Encounter Date: 08/07/2020  Check In:  Session Check In - 08/07/20 1343      Check-In   Supervising physician immediately available to respond to emergencies See telemetry face sheet for immediately available ER MD    Location [facility]-Cardiac & Pulmonary Rehab    Staff Present Nyoka Cowden, RN, BSN, MA;Joseph Hood [place], [place], RCEP, CCRP, CCET    Virtual Visit No    Fall or balance concerns reported    No    Warm-up and Cool-down Performed on first and last piece of equipment    Resistance Training Performed Yes    VAD Patient? No    PAD/SET Patient? No              Social History   Tobacco Use  Smoking Status Former Smoker  . Years: 10.00  . Types: Cigarettes  . Quit date: 75  . Years since quitting: 49.6  Smokeless Tobacco Former Systems developer  . Types: Chew  Tobacco Comment   Quit 1977    Goals Met:  Independence with exercise equipment Exercise tolerated well No report of cardiac concerns or symptoms Strength training completed today  Goals Unmet:  Not Applicable  Comments: Pt able to follow exercise prescription today without complaint.  Will continue to monitor for progression. Reviewed home exercise with pt today.  Pt plans to walk and continue with leg exercises at home for exercise.  Reviewed THR, pulse, RPE, sign and symptoms, pulse oximetery and when to call 911 or MD.  Also discussed weather considerations and indoor options.  Pt voiced understanding.   Dr. Emily Filbert is Medical Director for [place] and [facility].

## 2020-08-08 ENCOUNTER — Other Ambulatory Visit (HOSPITAL_COMMUNITY): Payer: Self-pay | Admitting: Cardiology

## 2020-08-11 ENCOUNTER — Encounter: Payer: Medicare Other | Admitting: *Deleted

## 2020-08-11 ENCOUNTER — Other Ambulatory Visit: Payer: Self-pay

## 2020-08-11 DIAGNOSIS — Z952 Presence of prosthetic heart valve: Secondary | ICD-10-CM | POA: Diagnosis not present

## 2020-08-11 NOTE — Progress Notes (Signed)
Daily Session Note  Patient Details  Name: DEWON MENDIZABAL MRN: [id number] Date of Birth: May 09, 1932 Referring Provider:     Cardiac Rehab from 07/21/2020 in [facility] Cardiac and Pulmonary Rehab  Referring Provider Aundra Dubin      Encounter Date: 08/11/2020  Check In:  Session Check In - 08/11/20 1347      Check-In   Supervising physician immediately available to respond to emergencies See telemetry face sheet for immediately available ER MD    Location [facility]-Cardiac & Pulmonary Rehab    Staff Present Heath Lark, RN, BSN, Laveda Norman, BS, ACSM CEP, Exercise Physiologist;Jessica [place], [place], RCEP, CCRP, CCET    Virtual Visit No    Medication changes reported     No    Fall or balance concerns reported    No    Warm-up and Cool-down Performed on first and last piece of equipment    Resistance Training Performed Yes    VAD Patient? No    PAD/SET Patient? No      Pain Assessment   Currently in Pain? No/denies              Social History   Tobacco Use  Smoking Status Former Smoker  . Years: 10.00  . Types: Cigarettes  . Quit date: 65  . Years since quitting: 49.6  Smokeless Tobacco Former Systems developer  . Types: Chew  Tobacco Comment   Quit 1977    Goals Met:  Independence with exercise equipment Exercise tolerated well No report of cardiac concerns or symptoms  Goals Unmet:  Not Applicable  Comments: Pt able to follow exercise prescription today without complaint.  Will continue to monitor for progression.    Dr. Emily Filbert is Medical Director for [place] and [facility].

## 2020-08-13 ENCOUNTER — Other Ambulatory Visit: Payer: Self-pay

## 2020-08-13 ENCOUNTER — Encounter: Payer: Medicare Other | Admitting: *Deleted

## 2020-08-13 DIAGNOSIS — Z952 Presence of prosthetic heart valve: Secondary | ICD-10-CM | POA: Diagnosis not present

## 2020-08-13 NOTE — Progress Notes (Signed)
Daily Session Note  Patient Details  Name: Kristopher Fernandez MRN: [id number] Date of Birth: 1932/07/29 Referring Provider:     Cardiac Rehab from 07/21/2020 in [facility] Cardiac and Pulmonary Rehab  Referring Provider Aundra Dubin      Encounter Date: 08/13/2020  Check In:  Session Check In - 08/13/20 1343      Check-In   Supervising physician immediately available to respond to emergencies See telemetry face sheet for immediately available ER MD    Location [facility]-Cardiac & Pulmonary Rehab    Staff Present Renita Papa, RN Margurite Auerbach, MS Exercise Physiologist;Melissa Caiola RDN, Rowe Pavy, BA, ACSM CEP, Exercise Physiologist    Virtual Visit No    Medication changes reported     No    Fall or balance concerns reported    No    Warm-up and Cool-down Performed on first and last piece of equipment    Resistance Training Performed Yes    VAD Patient? No    PAD/SET Patient? No      Pain Assessment   Currently in Pain? No/denies              Social History   Tobacco Use  Smoking Status Former Smoker  . Years: 10.00  . Types: Cigarettes  . Quit date: 36  . Years since quitting: 49.6  Smokeless Tobacco Former Systems developer  . Types: Chew  Tobacco Comment   Quit 1977    Goals Met:  Independence with exercise equipment Exercise tolerated well No report of cardiac concerns or symptoms Strength training completed today  Goals Unmet:  Not Applicable  Comments: Pt able to follow exercise prescription today without complaint.  Will continue to monitor for progression.    Dr. Emily Filbert is Medical Director for [place] and [facility].

## 2020-08-14 ENCOUNTER — Other Ambulatory Visit: Payer: Self-pay

## 2020-08-14 ENCOUNTER — Encounter: Payer: Medicare Other | Admitting: *Deleted

## 2020-08-14 DIAGNOSIS — Z952 Presence of prosthetic heart valve: Secondary | ICD-10-CM | POA: Diagnosis not present

## 2020-08-14 NOTE — Progress Notes (Signed)
Daily Session Note  Patient Details  Name: Kristopher Fernandez MRN: [id number] Date of Birth: 1932/03/13 Referring Provider:     Cardiac Rehab from 07/21/2020 in [facility] Cardiac and Pulmonary Rehab  Referring Provider Aundra Dubin      Encounter Date: 08/14/2020  Check In:  Session Check In - 08/14/20 1330      Check-In   Supervising physician immediately available to respond to emergencies See telemetry face sheet for immediately available ER MD    Location [facility]-Cardiac & Pulmonary Rehab    Staff Present Renita Papa, RN Vickki Hearing, BA, ACSM CEP, Exercise Physiologist;Jessica Luan Pulling, MA, RCEP, CCRP, CCET    Virtual Visit No    Medication changes reported     No    Fall or balance concerns reported    No    Warm-up and Cool-down Performed on first and last piece of equipment    Resistance Training Performed Yes    VAD Patient? No    PAD/SET Patient? No      Pain Assessment   Currently in Pain? No/denies              Social History   Tobacco Use  Smoking Status Former Smoker  . Years: 10.00  . Types: Cigarettes  . Quit date: 48  . Years since quitting: 49.6  Smokeless Tobacco Former Systems developer  . Types: Chew  Tobacco Comment   Quit 1977    Goals Met:  Independence with exercise equipment Exercise tolerated well No report of cardiac concerns or symptoms Strength training completed today  Goals Unmet:  Not Applicable  Comments: Pt able to follow exercise prescription today without complaint.  Will continue to monitor for progression.    Dr. Emily Filbert is Medical Director for [place] and [facility].

## 2020-08-18 ENCOUNTER — Other Ambulatory Visit: Payer: Self-pay

## 2020-08-18 ENCOUNTER — Encounter: Payer: Medicare Other | Admitting: *Deleted

## 2020-08-18 DIAGNOSIS — Z952 Presence of prosthetic heart valve: Secondary | ICD-10-CM | POA: Diagnosis not present

## 2020-08-18 NOTE — Progress Notes (Signed)
Daily Session Note  Patient Details  Name: Kristopher Fernandez MRN: [id number] Date of Birth: 1932-12-09 Referring Provider:     Cardiac Rehab from 07/21/2020 in [facility] Cardiac and Pulmonary Rehab  Referring Provider Aundra Dubin      Encounter Date: 08/18/2020  Check In:  Session Check In - 08/18/20 1341      Check-In   Supervising physician immediately available to respond to emergencies See telemetry face sheet for immediately available ER MD    Location [facility]-Cardiac & Pulmonary Rehab    Staff Present Renita Papa, RN Margurite Auerbach, MS Exercise Physiologist;Melissa Caiola RDN, Rowe Pavy, BA, ACSM CEP, Exercise Physiologist    Virtual Visit No    Medication changes reported     No    Fall or balance concerns reported    No    Warm-up and Cool-down Performed on first and last piece of equipment    Resistance Training Performed Yes    VAD Patient? No    PAD/SET Patient? No      Pain Assessment   Currently in Pain? No/denies              Social History   Tobacco Use  Smoking Status Former Smoker   Years: 10.00   Types: Cigarettes   Quit date: 1972   Years since quitting: 49.6  Smokeless Tobacco Former Systems developer   Types: Chew  Tobacco Comment   Quit 1977    Goals Met:  Independence with exercise equipment Exercise tolerated well No report of cardiac concerns or symptoms Strength training completed today  Goals Unmet:  Not Applicable  Comments: Pt able to follow exercise prescription today without complaint.  Will continue to monitor for progression.    Dr. Emily Filbert is Medical Director for [place] and [facility].

## 2020-08-19 ENCOUNTER — Ambulatory Visit (INDEPENDENT_AMBULATORY_CARE_PROVIDER_SITE_OTHER): Payer: Medicare Other | Admitting: Internal Medicine

## 2020-08-19 ENCOUNTER — Other Ambulatory Visit: Payer: Self-pay

## 2020-08-19 ENCOUNTER — Encounter: Payer: Self-pay | Admitting: Internal Medicine

## 2020-08-19 VITALS — BP 100/66 | HR 73 | Ht 72.0 in | Wt 183.5 lb

## 2020-08-19 DIAGNOSIS — Z950 Presence of cardiac pacemaker: Secondary | ICD-10-CM

## 2020-08-19 DIAGNOSIS — I350 Nonrheumatic aortic (valve) stenosis: Secondary | ICD-10-CM

## 2020-08-19 DIAGNOSIS — I255 Ischemic cardiomyopathy: Secondary | ICD-10-CM | POA: Diagnosis not present

## 2020-08-19 DIAGNOSIS — I5022 Chronic systolic (congestive) heart failure: Secondary | ICD-10-CM

## 2020-08-19 DIAGNOSIS — I495 Sick sinus syndrome: Secondary | ICD-10-CM | POA: Diagnosis not present

## 2020-08-19 DIAGNOSIS — I493 Ventricular premature depolarization: Secondary | ICD-10-CM

## 2020-08-19 DIAGNOSIS — Z79899 Other long term (current) drug therapy: Secondary | ICD-10-CM

## 2020-08-19 MED ORDER — AMIODARONE HCL 200 MG PO TABS
ORAL_TABLET | ORAL | 0 refills | Status: DC
Start: 2020-08-19 — End: 2020-09-08

## 2020-08-19 NOTE — Patient Instructions (Signed)
Medication Instructions:  - Your physician has recommended you make the following change in your medication:   1) STOP Ranexa (ranolazine)  2) START Amiodarone 200 mg  - take 2 tablets (400 mg) by mouth TWICE daily x 2 weeks, then - take 1 tablet (200 mg) by mouth TWICE daily x 2 weeks, then - take 1 tablet (200 mg) by mouth ONCE daily   *If you need a refill on your cardiac medications before your next appointment, please call your pharmacy*   Lab Work: - Your physician recommends that you have lab work today: TSH/ Liver  - Your physician recommends that you return for lab work in: 6 weeks (around 09/29/20): TSH/ Liver - Dr. Olin Pia nurse will call you closer to time to remind you to come for these - you will come to the [place] at [facility] - 1st desk on the right, just past the screening table to check in - lab hours are Monday- Friday (7:30 am- 5:30 pm)   If you have labs (blood work) drawn today and your tests are completely normal, you will receive your results only by: Marland Kitchen MyChart Message (if you have MyChart) OR . A paper copy in the mail If you have any lab test that is abnormal or we need to change your treatment, we will call you to review the results.   Testing/Procedures: Your physician has recommended that you wear a 3 day Zio XT monitor- to be placed in 10 weeks (around 10/27/20) in the office. This monitor is a medical device that records the heart's electrical activity. Doctors most often use these monitors to diagnose arrhythmias. Arrhythmias are problems with the speed or rhythm of the heartbeat. The monitor is a small device applied to your chest. You can wear one while you do your normal daily activities. While wearing this monitor if you have any symptoms to push the button and record what you felt. Once you have worn this monitor for the period of time provider prescribed (Usually 14 days), you will return the monitor device in the postage paid box. Once it is  returned they will download the data collected and provide [place] with a report which the provider will then review and we will call you with those results. Important tips:  1. Avoid showering during the first 24 hours of wearing the monitor. 2. Avoid excessive sweating to help maximize wear time. 3. Do not submerge the device, no hot tubs, and no swimming pools. 4. Keep any lotions or oils away from the patch. 5. After 24 hours you may shower with the patch on. Take brief showers with your back facing the shower head.  6. Do not remove patch once it has been placed because that will interrupt data and decrease adhesive wear time. 7. Push the button when you have any symptoms and write down what you were feeling. 8. Once you have completed wearing your monitor, remove and place into box which has postage paid and place in your outgoing mailbox.  9. If for some reason you have misplaced your box then call our office and we can provide another box and/or mail it off for you.         Follow-Up: At [facility], you and your health needs are our priority.  As part of our continuing mission to provide you with exceptional heart care, we have created designated Provider Care Teams.  These Care Teams include your primary Cardiologist (physician) and Advanced Practice Providers (APPs -  Physician Assistants and Nurse Practitioners) who all work together to provide you with the care you need, when you need it.  We recommend signing up for the patient portal called "MyChart".  Sign up information is provided on this After Visit Summary.  MyChart is used to connect with patients for Virtual Visits (Telemedicine).  Patients are able to view lab/test results, encounter notes, upcoming appointments, etc.  Non-urgent messages can be sent to your provider as well.   To learn more about what you can do with MyChart, go to [email or web address].    Your next appointment:   12 week(s)  The format for your  next appointment:   In Person  Provider:   Virl Axe, MD   Other Instructions n/a

## 2020-08-19 NOTE — Progress Notes (Signed)
Patient Care Team: Tracie Harrier, MD as PCP - General (Internal Medicine) Larey Dresser, MD as Consulting Physician (Cardiology)   HPI  Kristopher Fernandez is a 86 y.o. male Seen in follow-up for Medtronic pacemaker implanted remotely 2012 for sinus bradycardia  He has a history of ischemic heart disease with depressed LV function; history of multiple PCI's dating back to 2006 and most recently 2015 he has been recently found to have aortic stenosis thought to be moderate-severe as assessed by dobutamine stress echo.  Underwent TAVR 6/21 without subsequent improvement.  Has frequent ectopy.  Has been on Ranexa dating back to his days at [facility] for complex coronary disease     Played with [facility]  DATE TEST EF   5/20 LHC    % Diffuse disease, previously implanted stents patent  10/20 Echo   20-25 % Grad mean 16  2/21 PYP  Neg for TTR amyloid   7/21 Echo  25-30%    Date Cr K Hgb  2/21 1.51 4.4<<5.3 13.3   7/21 1.32 4.1 10.6   Has prostate cancer with hypermetabolic left iliac nodes.  Radiation therapy initiated  This is been associated with profound dyspnea severely short of breath at 50-100 feet.  Not at rest.  No accompanying edema.  No chest pain.  Records and Results Reviewed   Past Medical History:  Diagnosis Date  . Anginal pain ([place])   . Aortic stenosis   . Aortic stenosis   . Basal cell carcinoma, ear 03/31/2015  . Bradycardia 07/03/2014  . CHF (congestive heart failure) ([place])   . DDD (degenerative disc disease), lumbar 08/06/2015  . H/O cardiac catheterization 07/03/2014   Overview:  Cypher stent proximal and distal RCA 07/18/05 and TAXUS stent mid LAD 07/20/05 at [facility]  . HTN (hypertension) 07/03/2014  . Hyperlipidemia 07/03/2014  . Ischemic cardiomyopathy   . Lumbosacral radiculopathy at S1 08/22/2015  . MI (myocardial infarction) ([place]) 07/03/2014   Overview:  Mi 07/17/05  . Myocardial infarction ([place])   . Normocytic normochromic anemia  09/14/2015  . Pacemaker 07/03/2014   Overview:  Dual chamber pacemaker 08/11/11  . Pancreatic mass    not hypermetabolic on PET-CT imaging  . Prostate cancer ([place])    metastatic  . 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 Shortness of breath dyspnea   . SOB (shortness of breath) on exertion 05/13/2016    Past Surgical History:  Procedure Laterality Date  . CARDIAC CATHETERIZATION Bilateral 07/14/2016   Procedure: Right/Left Heart Cath and Coronary Angiography;  Surgeon: Isaias Cowman, MD;  Location: [place] CV LAB;  Service: Cardiovascular;  Laterality: Bilateral;  . CORONARY ANGIOPLASTY    . HERNIA REPAIR    . LEFT HEART CATH AND CORONARY ANGIOGRAPHY N/A 05/09/2019   Procedure: LEFT HEART CATH AND CORONARY ANGIOGRAPHY;  Surgeon: Corey Skains, MD;  Location: [place] CV LAB;  Service: Cardiovascular;  Laterality: N/A;  . PROSTATE BIOPSY N/A 12/11/2019   Procedure: PROSTATE BIOPSY;  Surgeon: Abbie Sons, MD;  Location: [facility] ORS;  Service: Urology;  Laterality: N/A;  . RIGHT HEART CATH N/A 05/17/2019   Procedure: RIGHT HEART CATH;  Surgeon: Larey Dresser, MD;  Location: [place] CV LAB;  Service: Cardiovascular;  Laterality: N/A;  . RIGHT HEART CATH AND CORONARY ANGIOGRAPHY N/A 05/22/2020   Procedure: RIGHT HEART CATH  AND CORONARY ANGIOGRAPHY;  Surgeon: Larey Dresser, MD;  Location: [place] CV LAB;  Service: Cardiovascular;  Laterality: N/A;  . TEE WITHOUT CARDIOVERSION N/A 05/14/2020   Procedure: TRANSESOPHAGEAL ECHOCARDIOGRAM (TEE);  Surgeon: Larey Dresser, MD;  Location: [facility] ENDOSCOPY;  Service: Cardiovascular;  Laterality: N/A;  . TEE WITHOUT CARDIOVERSION N/A 06/17/2020   Procedure: TRANSESOPHAGEAL ECHOCARDIOGRAM (TEE);  Surgeon: Burnell Blanks, MD;  Location: [place];  Service: Open Heart Surgery;  Laterality: N/A;  . TRANSCATHETER  AORTIC VALVE REPLACEMENT, TRANSFEMORAL N/A 06/17/2020   Procedure: TRANSCATHETER AORTIC VALVE REPLACEMENT, TRANSFEMORAL;  Surgeon: Burnell Blanks, MD;  Location: [place];  Service: Open Heart Surgery;  Laterality: N/A;  . TRANSRECTAL ULTRASOUND N/A 12/11/2019   Procedure: TRANSRECTAL ULTRASOUND;  Surgeon: Abbie Sons, MD;  Location: [facility] ORS;  Service: Urology;  Laterality: N/A;    Current Meds  Medication Sig  . albuterol (VENTOLIN HFA) 108 (90 Base) MCG/ACT inhaler Inhale 1-2 puffs into the lungs every 6 (six) hours as needed for wheezing or shortness of breath.  Marland Kitchen aspirin 81 MG chewable tablet Chew 1 tablet (81 mg total) by mouth daily.  Marland Kitchen atorvastatin (LIPITOR) 80 MG tablet Take 80 mg by mouth daily at 6 PM.   . carvedilol (COREG) 6.25 MG tablet TAKE 1 TABLET BY MOUTH 2 TIMES DAILY WITH A MEAL  . clopidogrel (PLAVIX) 75 MG tablet Take 1 tablet (75 mg total) by mouth daily.  Marland Kitchen ipratropium-albuterol (DUONEB) 0.5-2.5 (3) MG/3ML SOLN Take 3 mLs by nebulization every 6 (six) hours as needed.  Marland Kitchen losartan (COZAAR) 25 MG tablet Take 0.5 tablets (12.5 mg total) by mouth daily.  . ranolazine (RANEXA) 500 MG 12 hr tablet Take 1 tablet (500 mg total) by mouth 2 (two) times daily.  Marland Kitchen spironolactone (ALDACTONE) 25 MG tablet Take 0.5 tablets (12.5 mg total) by mouth at bedtime.    No Known Allergies    Review of Systems negative except from HPI and PMH  Physical Exam BP 100/66 (BP Location: Left Arm, Patient Position: Sitting, Cuff Size: Normal)   Pulse 73   Ht 6' (1.829 m)   Wt 183 lb 8 oz (83.2 kg)   SpO2 94%   BMI 24.89 kg/m  Well developed and well nourished in no acute distress HENT normal E scleral and icterus clear Neck Supple Clear to ausculation  Regular rate and rhythm, 2/6 murmur  Soft with active bowel sounds No clubbing cyanosis  Edema Alert and oriented, grossly normal motor and sensory function Skin Warm and Dry  ECG    CrCl cannot be calculated (Patient's  most recent lab result is older than the maximum 21 days allowed.).   Assessment and  Plan  Sinus node dysfunction  Pacemaker-Medtronic DOI 2012  Cardiomyopathy-  ischemic multiple revascularization by stenting  PVCs-frequent question contributing to cardiomyopathy  Congestive heart failure-chronic-systolic class IIb-IIIa  Aortic stenosis-moderate-severe s/p TAVR 6/21  Chronic kidney disease  CVA   Dyspnea on exertion   COPD/emphysema   Dyspnea has been relentless and in his mind associated with radiation therapy.  Not sure I understand the mechanism.  CT scanning 1/20 demonstrated diffuse emphysema.  Is on bronchodilators without significant improvement.  Also no significant improvement with TAVR.  Ambulation was associated with stable oxygen saturations  Discussed with Dr. DM the potential contribution of his PVCs to his cardiomyopathy and hence to his dyspnea.  Not altogether sanguine that would be beneficial to discontinue ranolazine and use amiodarone both as an anti-ischemic and as  an antiarrhythmic.  Discussed side effects especially in the lung issue.  The plan would be a 3-4 months trial  Device function is normal.  With reasonable chronotropic response.   Current medicines are reviewed at length with the patient today .  The patient does not  have concerns regarding medicines.

## 2020-08-20 ENCOUNTER — Encounter: Payer: Medicare Other | Attending: Internal Medicine | Admitting: *Deleted

## 2020-08-20 DIAGNOSIS — E785 Hyperlipidemia, unspecified: Secondary | ICD-10-CM | POA: Insufficient documentation

## 2020-08-20 DIAGNOSIS — I110 Hypertensive heart disease with heart failure: Secondary | ICD-10-CM | POA: Diagnosis not present

## 2020-08-20 DIAGNOSIS — Z95810 Presence of automatic (implantable) cardiac defibrillator: Secondary | ICD-10-CM | POA: Insufficient documentation

## 2020-08-20 DIAGNOSIS — Z952 Presence of prosthetic heart valve: Secondary | ICD-10-CM | POA: Diagnosis present

## 2020-08-20 DIAGNOSIS — Z87891 Personal history of nicotine dependence: Secondary | ICD-10-CM | POA: Insufficient documentation

## 2020-08-20 DIAGNOSIS — Z7902 Long term (current) use of antithrombotics/antiplatelets: Secondary | ICD-10-CM | POA: Insufficient documentation

## 2020-08-20 DIAGNOSIS — Z7982 Long term (current) use of aspirin: Secondary | ICD-10-CM | POA: Diagnosis not present

## 2020-08-20 DIAGNOSIS — I252 Old myocardial infarction: Secondary | ICD-10-CM | POA: Insufficient documentation

## 2020-08-20 DIAGNOSIS — Z79899 Other long term (current) drug therapy: Secondary | ICD-10-CM | POA: Diagnosis not present

## 2020-08-20 DIAGNOSIS — I5022 Chronic systolic (congestive) heart failure: Secondary | ICD-10-CM | POA: Insufficient documentation

## 2020-08-20 LAB — HEPATIC FUNCTION PANEL
ALT: 11 IU/L (ref 0–44)
AST: 17 IU/L (ref 0–40)
Albumin: 3.6 g/dL (ref 3.6–4.6)
Alkaline Phosphatase: 107 IU/L (ref 48–121)
Bilirubin Total: 0.3 mg/dL (ref 0.0–1.2)
Bilirubin, Direct: 0.13 mg/dL (ref 0.00–0.40)
Total Protein: 6.3 g/dL (ref 6.0–8.5)

## 2020-08-20 LAB — TSH: TSH: 1.57 u[IU]/mL (ref 0.450–4.500)

## 2020-08-20 NOTE — Progress Notes (Signed)
Daily Session Note  Patient Details  Name: Kristopher Fernandez MRN: [id number] Date of Birth: December 20, 1932 Referring Provider:     Cardiac Rehab from 07/21/2020 in [facility] Cardiac and Pulmonary Rehab  Referring Provider Aundra Dubin      Encounter Date: 08/20/2020  Check In:  Session Check In - 08/20/20 1342      Check-In   Supervising physician immediately available to respond to emergencies See telemetry face sheet for immediately available ER MD    Location [facility]-Cardiac & Pulmonary Rehab    Staff Present Renita Papa, RN BSN;Joseph Lou Miner, [place] Exercise Physiologist    Virtual Visit No    Medication changes reported     No    Fall or balance concerns reported    No    Warm-up and Cool-down Performed on first and last piece of equipment    Resistance Training Performed Yes    VAD Patient? No    PAD/SET Patient? No      Pain Assessment   Currently in Pain? No/denies              Social History   Tobacco Use  Smoking Status Former Smoker  . Years: 10.00  . Types: Cigarettes  . Quit date: 85  . Years since quitting: 49.7  Smokeless Tobacco Former Systems developer  . Types: Chew  Tobacco Comment   Quit 1977    Goals Met:  Independence with exercise equipment Exercise tolerated well No report of cardiac concerns or symptoms Strength training completed today  Goals Unmet:  Not Applicable  Comments: Pt able to follow exercise prescription today without complaint.  Will continue to monitor for progression.    Dr. Emily Filbert is Medical Director for [place] and [facility].

## 2020-08-21 ENCOUNTER — Other Ambulatory Visit: Payer: Self-pay

## 2020-08-21 ENCOUNTER — Encounter: Payer: Medicare Other | Admitting: *Deleted

## 2020-08-21 DIAGNOSIS — Z952 Presence of prosthetic heart valve: Secondary | ICD-10-CM | POA: Diagnosis not present

## 2020-08-21 NOTE — Progress Notes (Signed)
Daily Session Note  Patient Details  Name: Kristopher Fernandez MRN: [id number] Date of Birth: 10/12/1932 Referring Provider:     Cardiac Rehab from 07/21/2020 in [facility] Cardiac and Pulmonary Rehab  Referring Provider [place]      Encounter Date: 08/21/2020  Check In:  Session Check In - 08/21/20 1335      Check-In   Supervising physician immediately available to respond to emergencies See telemetry face sheet for immediately available ER MD    Location [facility]-Cardiac & Pulmonary Rehab    Staff Present Meredith Craven, RN BSN;Kara Langdon, MS Exercise Physiologist;Jessica Hawkins, MA, RCEP, CCRP, CCET;Melissa Caiola RDN, LDN    Virtual Visit No    Medication changes reported     No    Fall or balance concerns reported    No    Warm-up and Cool-down Performed on first and last piece of equipment    Resistance Training Performed Yes    VAD Patient? No    PAD/SET Patient? No      Pain Assessment   Currently in Pain? No/denies              Social History   Tobacco Use  Smoking Status Former Smoker  . Years: 10.00  . Types: Cigarettes  . Quit date: 1972  . Years since quitting: 49.7  Smokeless Tobacco Former User  . Types: Chew  Tobacco Comment   Quit 1977    Goals Met:  Independence with exercise equipment Exercise tolerated well No report of cardiac concerns or symptoms Strength training completed today  Goals Unmet:  Not Applicable  Comments: Pt able to follow exercise prescription today without complaint.  Will continue to monitor for progression.    Dr. Mark Miller is Medical Director for [facility] and [facility]. 

## 2020-08-27 ENCOUNTER — Ambulatory Visit (HOSPITAL_COMMUNITY)
Admission: RE | Admit: 2020-08-27 | Discharge: 2020-08-27 | Disposition: A | Discharge status: Home / Self Care | Payer: Medicare Other | Source: Ambulatory Visit | Attending: Cardiology | Admitting: Cardiology

## 2020-08-27 ENCOUNTER — Encounter: Payer: Self-pay | Admitting: *Deleted

## 2020-08-27 ENCOUNTER — Encounter: Payer: Medicare Other | Admitting: *Deleted

## 2020-08-27 ENCOUNTER — Other Ambulatory Visit: Payer: Self-pay

## 2020-08-27 VITALS — BP 100/60 | HR 70 | Wt 181.0 lb

## 2020-08-27 DIAGNOSIS — I252 Old myocardial infarction: Secondary | ICD-10-CM | POA: Insufficient documentation

## 2020-08-27 DIAGNOSIS — Z955 Presence of coronary angioplasty implant and graft: Secondary | ICD-10-CM | POA: Diagnosis not present

## 2020-08-27 DIAGNOSIS — F329 Major depressive disorder, single episode, unspecified: Secondary | ICD-10-CM | POA: Diagnosis not present

## 2020-08-27 DIAGNOSIS — I2510 Atherosclerotic heart disease of native coronary artery without angina pectoris: Secondary | ICD-10-CM | POA: Diagnosis not present

## 2020-08-27 DIAGNOSIS — E785 Hyperlipidemia, unspecified: Secondary | ICD-10-CM | POA: Insufficient documentation

## 2020-08-27 DIAGNOSIS — I495 Sick sinus syndrome: Secondary | ICD-10-CM | POA: Insufficient documentation

## 2020-08-27 DIAGNOSIS — I350 Nonrheumatic aortic (valve) stenosis: Secondary | ICD-10-CM | POA: Insufficient documentation

## 2020-08-27 DIAGNOSIS — I5022 Chronic systolic (congestive) heart failure: Secondary | ICD-10-CM | POA: Diagnosis not present

## 2020-08-27 DIAGNOSIS — Z8546 Personal history of malignant neoplasm of prostate: Secondary | ICD-10-CM | POA: Insufficient documentation

## 2020-08-27 DIAGNOSIS — N183 Chronic kidney disease, stage 3 unspecified: Secondary | ICD-10-CM | POA: Diagnosis not present

## 2020-08-27 DIAGNOSIS — Z952 Presence of prosthetic heart valve: Secondary | ICD-10-CM | POA: Diagnosis not present

## 2020-08-27 DIAGNOSIS — Z923 Personal history of irradiation: Secondary | ICD-10-CM | POA: Diagnosis not present

## 2020-08-27 DIAGNOSIS — I130 Hypertensive heart and chronic kidney disease with heart failure and stage 1 through stage 4 chronic kidney disease, or unspecified chronic kidney disease: Secondary | ICD-10-CM | POA: Diagnosis not present

## 2020-08-27 DIAGNOSIS — M5136 Other intervertebral disc degeneration, lumbar region: Secondary | ICD-10-CM | POA: Diagnosis not present

## 2020-08-27 DIAGNOSIS — J439 Emphysema, unspecified: Secondary | ICD-10-CM | POA: Diagnosis not present

## 2020-08-27 DIAGNOSIS — Z950 Presence of cardiac pacemaker: Secondary | ICD-10-CM | POA: Insufficient documentation

## 2020-08-27 DIAGNOSIS — I255 Ischemic cardiomyopathy: Secondary | ICD-10-CM | POA: Diagnosis not present

## 2020-08-27 DIAGNOSIS — Z87891 Personal history of nicotine dependence: Secondary | ICD-10-CM | POA: Diagnosis not present

## 2020-08-27 LAB — BASIC METABOLIC PANEL
Anion gap: 9 (ref 5–15)
BUN: 21 mg/dL (ref 8–23)
CO2: 25 mmol/L (ref 22–32)
Calcium: 8.9 mg/dL (ref 8.9–10.3)
Chloride: 103 mmol/L (ref 98–111)
Creatinine, Ser: 1.47 mg/dL — ABNORMAL HIGH (ref 0.61–1.24)
GFR calc Af Amer: 49 mL/min — ABNORMAL LOW (ref >60)
GFR calc non Af Amer: 43 mL/min — ABNORMAL LOW (ref >60)
Glucose, Bld: 105 mg/dL — ABNORMAL HIGH (ref 70–99)
Potassium: 4.8 mmol/L (ref 3.5–5.1)
Sodium: 137 mmol/L (ref 135–145)

## 2020-08-27 MED ORDER — SPIRIVA HANDIHALER 18 MCG IN CAPS: 18.0000 ug | ORAL_CAPSULE | Freq: Every day | RESPIRATORY_TRACT | 0 refills | Status: AC

## 2020-08-27 MED ORDER — SPIRONOLACTONE 25 MG PO TABS
25.0000 mg | ORAL_TABLET | Freq: Every day | ORAL | 3 refills | Status: DC
Start: 2020-08-27 — End: 2020-09-02

## 2020-08-27 MED ORDER — ALBUTEROL SULFATE HFA 108 (90 BASE) MCG/ACT IN AERS: 1.0000 | INHALATION_SPRAY | Freq: Four times a day (QID) | RESPIRATORY_TRACT | 0 refills | Status: AC | PRN

## 2020-08-27 NOTE — Progress Notes (Signed)
PCP: Tracie Harrier, MD HF Cardiology: Dr. Aundra Dubin  86 y.o. with history of CAD s/p multiple PCIs, ischemic cardiomyopathy, sick sinus syndrome s/p PPM, and CKD stage 3 returns for followup of CHF.  Patient has a long history of CAD detailed in the [place] below.  He also has an ischemic cardiomyopathy, with echo in 1/20 showing EF 40-45%. He had a Medtronic pacemaker placed for bradycardia in 2012. He was admitted with somewhat atypical chest pain in 5/20, TnI was mildly elevated at 0.08, with serial troponins there was no trend (maximal 0.09).  He underwent left heart cath, showing stable disease with no intervention planned (chronic occlusion of mid LCx and 85% ostial PDA).  His echo showed that EF was lower than prior at 20-25%.  He was thought to be volume overloaded.  He was diuresed with IV Lasix, and Coreg + Entresto were started.  Patient's BP fell significantly with initiation of cardiac meds and he had orthostatic symptoms.  All his cardiac meds had to be stopped.  There was concern that he may have low output HF and may require inotropic support, so he was transferred to [facility].  RHC at [facility] showed low filling pressures and normal cardiac output.  He continued to have dizziness and also cerebellar signs.  He was seen by neurology and suspected to have a cerebellar CVA.  CTA head showed multiple old small vessel infarcts, no MRI done due to PPM.  Eliquis was started based on neurology recommendation given low EF and CVA.  He gradually improved and was discharged home.   Echo was done in 10/20, ordered by neurology.  EF remained 20-25%, but aortic stenosis was reported as severe. I had not seen this echo prior but reviewed today. Calculated AVA < 1 but highest mean gradient obtained was 16 mmHg.  Based on appearance of valve, this certainly could be low gradient severe AS but valve not seen well.    Low dose DSE was done in 1/21.  This was suggestive of moderate AS approaching severe.    Patient was noted to have a pancreatic mass on CT but PET scan was not suggestive of pancreatic cancer.  He does have prostate cancer and was treated with radiation.   Given worsening dyspnea after prostate cancer radiation, he had a TEE which showed low flow/low gradient severe AS.  LHC/RHC was done in 6/21 showing stable coronary disease with normal filling pressures on the RHC.  He had TAVR in 6/21 with Edwards #26 Sapien valve.  Post-op echo in 6/21 showed EF 25-30%, stable TAVR valve.   Repeat echo was done in 7/21 post-TAVR, showing EF 25-30%, mildly decreased RV function, bioprosthetic aortic valve s/p TAVR with mild AI, mean gradient 8 mmHg.   Seen by Dr. Caryl Comes and started on amiodarone for PVC suppression.   He returns today for followup of CHF and AS.   He has not felt much different since TAVR, still short of breath after walking about 100 feet.  This began when he had radiation for his prostate cancer.  No orthopnea/PND.  Weight is stable.  Interestingly, he feels less short of breath after using albuterol.  He has been taking this once a day in the morning.  Later in the day, the effect wears off. He is doing cardiac rehab at [facility].   ECG (personally reviewed): a-paced, 1st degree AVB, nonspecific TWF, no PVCs  Labs (6/20): K 5.1 => 4.2, creatinine 1.46 => 1.33, LDL 75 Labs (12/20): K 4.2,  creatinine 1.27 Labs (1/21):  Myeloma panel negative, urine IFXN negative, LDL 49 Labs (2/21): K 4.4, creatinine 1.54, hgb 13.6 Labs (4/21): hgb 12.4 Labs (5/21): K 5.2, creatinine 1.3 Labs (7/21): K 4.1, creatinine 1.32 Labs (8/21): TSH and LFTs normal  MDT device interrogation: 97% a-paced, 12.5% v-paced, no mode switches  Past Medical History: 1. Low back pain/degenerative disc disease.  2. Sick sinus syndrome: Dual chamber Medtronic PPM placed 8/12.  3. Aortic stenosis: Mild to moderate on 5/20 echo.  - Echo in 10/20 with ?low gradient severe AS with mean gradient only 15 mmHg but  calculated AVA 0.78 cm^2.  - Low dose DSE in 1/21: Moderate AS approaching severe. Rest mean gradient 13 with DVI 0.31 and AVA 1.1 cm^2 => peak stress mean gradient 34 mmHg with DVI 0.28 and AVA 1.0 cm^2.  - TEE 5/21 with severe low flow/low gradient AS.  - TAVR 6/21 with #26 Oletta Lamas Sapien valve.  4. CAD: Extensive history.  - 7/06: Inferior MI with Cypher DES to prox and distal RCA.  - 07/20/05: Taxus DES to mLAD.  - 1/11: Xience DES to OM1 - 4/15: Overlapping Xience DES x 2 to ostial and proximal RCA.  - LHC (5/20): Nonobstructive disease in LAD, occluded mid LCx (chronic), 85% ostial PDA.  No intervention.  - LHC (6/21): 80% mid PLV (chronic), totally occluded LCx after OM1 (chronic), 50% proximal and 50-60% distal LAD stenosis.  5. Chronic systolic CHF: Ischemic cardiomyopathy.   - Echo (1/20) with EF 40-45%.  - Echo (5/20) with EF 20-25%, normal RV, mild-moderate MR, mild-moderate AS with mean gradient 11 mmHg and AVA 1.47 cm^2.  - RHC (5/20): mean RA 3, PA 38/8 mean 18, mean PCWP 6, CI 2.68.  - Echo (10/20): EF 20-25%, possible low gradient severe AS.  - PYP scan negative in 2/21.  - Echo (7/21, post-TAVR): EF 25-30%, mildly decreased RV systolic function, normal IVC, bioprosthetic aortic valve s/p TAVR with mean gradient 8 and mild AI.  6. CKD: Stage 3.  7. COPD: Remote smoking.  Emphysema on prior CTs of chest.  8. HTN 9. Hyperlipidemia 10. Depression 11. CVA: Suspected cerebellar CVA in 5/20.  Eliquis started.  - CTA head (unable to do MRI with PPM): <50% carotid stenosis bilaterally.  Multiple old small vessel infarcts.  12. Prostate Cancer: Radiation therapy, ending 5/21.  13. Pancreatic mass: Elevated CA 19-9.  PET scan was not suggestive of cancer.  14. Ascending aortic aneurysm: 4.5 cm on 6/21 CT chest.   Social History   Socioeconomic History  . Marital status: Widowed    Spouse name: Not on file  . Number of children: 1  . Years of education: Not on file  . Highest  education level: Not on file  Occupational History  . Occupation: retired-farmer  Tobacco Use  . Smoking status: Former Smoker    Years: 10.00    Types: Cigarettes    Quit date: 1972    Years since quitting: 49.7  . Smokeless tobacco: Former Systems developer    Types: Chew  . Tobacco comment: Quit 1977  Vaping Use  . Vaping Use: Never used  Substance and Sexual Activity  . Alcohol use: No  . Drug use: No  . Sexual activity: Not on file  Other Topics Concern  . Not on file  Social History Narrative  . Not on file   Social Determinants of Health   Financial Resource Strain: Low Risk   . Difficulty of Paying Living Expenses:  Not hard at all  Food Insecurity: No Food Insecurity  . Worried About Charity fundraiser in the Last Year: Never true  . Ran Out of Food in the Last Year: Never true  Transportation Needs: No Transportation Needs  . Lack of Transportation (Medical): No  . Lack of Transportation (Non-Medical): No  Physical Activity: Insufficiently Active  . Days of Exercise per Week: 4 days  . Minutes of Exercise per Session: 10 min  Stress: Stress Concern Present  . Feeling of Stress : Very much  Social Connections:   . Frequency of Communication with Friends and Family: Not on file  . Frequency of Social Gatherings with Friends and Family: Not on file  . Attends Religious Services: Not on file  . Active Member of Clubs or Organizations: Not on file  . Attends Archivist Meetings: Not on file  . Marital Status: Not on file  Intimate Partner Violence:   . Fear of Current or Ex-Partner: Not on file  . Emotionally Abused: Not on file  . Physically Abused: Not on file  . Sexually Abused: Not on file   Family History  Problem Relation Age of Onset  . Heart attack Father   . Bladder Cancer Neg Hx   . Kidney cancer Neg Hx   . Prostate cancer Neg Hx    ROS: All systems reviewed and negative except as per HPI.   Current Outpatient Medications  Medication Sig  Dispense Refill  . albuterol (VENTOLIN HFA) 108 (90 Base) MCG/ACT inhaler Inhale 1-2 puffs into the lungs every 6 (six) hours as needed for wheezing or shortness of breath. 1 each 0  . amiodarone (PACERONE) 200 MG tablet Take 2 tablets (400 mg) by mouth TWICE daily x 2 weeks, then take 1 tablet (200 mg) by mouth TWICE daily x 2 weeks 90 tablet 0  . aspirin 81 MG chewable tablet Chew 1 tablet (81 mg total) by mouth daily.    Marland Kitchen atorvastatin (LIPITOR) 80 MG tablet Take 80 mg by mouth daily at 6 PM.     . carvedilol (COREG) 6.25 MG tablet TAKE 1 TABLET BY MOUTH 2 TIMES DAILY WITH A MEAL 60 tablet 5  . clopidogrel (PLAVIX) 75 MG tablet Take 1 tablet (75 mg total) by mouth daily. 90 tablet 1  . furosemide (LASIX) 20 MG tablet Take 1 tablet (20 mg total) by mouth as needed (weight. Take one tablet if weight increases by 3 pounds overnight or 5 pounds in one week.). 30 tablet 0  . ipratropium-albuterol (DUONEB) 0.5-2.5 (3) MG/3ML SOLN Take 3 mLs by nebulization every 6 (six) hours as needed.    Marland Kitchen losartan (COZAAR) 25 MG tablet Take 0.5 tablets (12.5 mg total) by mouth daily. 15 tablet 3  . spironolactone (ALDACTONE) 25 MG tablet Take 1 tablet (25 mg total) by mouth at bedtime. 90 tablet 3  . venlafaxine (EFFEXOR) 75 MG tablet Take 75 mg by mouth as needed.    . tiotropium (SPIRIVA HANDIHALER) 18 MCG inhalation capsule Place 1 capsule (18 mcg total) into inhaler and inhale daily. 30 capsule 0   No current facility-administered medications for this encounter.    BP 100/60   Pulse 70   Wt 82.1 kg (181 lb)   SpO2 96%   BMI 24.55 kg/m  General: NAD Neck: No JVD, no thyromegaly or thyroid nodule.  Lungs: Distant BS CV: Nondisplaced PMI.  Heart regular S1/S2, no S3/S4, 1/6 SEM RUSB.  No peripheral edema.  No carotid  bruit.  Normal pedal pulses.  Abdomen: Soft, nontender, no hepatosplenomegaly, no distention.  Skin: Intact without lesions or rashes.  Neurologic: Alert and oriented x 3.  Psych: Normal  affect. Extremities: No clubbing or cyanosis.  HEENT: Normal.    Assessment/Plan: 1. Chronic systolic CHF: Ischemic cardiomyopathy. Echo in 1/20 with EF 40-45%, but now down to 20-25% on echo from 5/20. The cause of the fall in EF is uncertain, he had coronary angiogram at [facility] in 5/20 with stable anatomy (no intervention).  He was been pacing his RV only 5% of the time when device was interrogated.  RHC in 5/20 showed low filling pressures and good cardiac output.  Echo in 10/20 showed stable EF 20-25% range.  Echo was done today and reviewed, EF remains 25-30% with stable bioprosthetic aortic valve s/p TAVR.  BP has tended to run low with orthostatic hypotension when cardiac meds are added but doing well on current regimen. No evidence for amyloidosis with negative myeloma panel and urine IFXN as well as PYP scan negative.  Still with significant dyspnea, NYHA class III symptoms, but he is not volume overloaded on exam. He is RV pacing about 12.5% of the time, I do not think this is enough to upgrade him to CRT.  TAVR did not help symptoms appreciably. I think that perhaps his symptoms are related to emphysema/COPD (see below).  - Continue Coreg 6.25 mg bid.     - He does not appear to need a diuretic.  - Continue losartan 12.5 mg daily, will not transition to Entresto given soft BP.  - Increase spironolactone to 25 mg daily. BMET today and in 10 days.    - Continue cardiac rehab.  2. CAD: Multiple interventions in the past.  Cath in 6/21 showed stable anatomy, no intervention. No chest pain.  - He is on ASA 81 + Plavix post-TAVR.  - Continue atorvastatin 80 mg daily, good lipids in 1/21.    3. CKD: Stage 3.  - BMET today.  4. Aortic stenosis: Low flow, low gradient AS s/p TAVR with #26 Edwards Sapien bioprosthetic aortic valve in 6/21.  Echo in 7/21 showed valve stable with mild peri-valvular leak.  5. Suspected cerebellar CVA: In 5/20.  Dizziness/?vertigo with dysmetria and diplopia on exam.   Seen by neurology and felt to have cerebellar stroke, repeat CT head with no acute changes (old small vessel infarcts). Carotid u/s 1-39% bilaterally. Vertebrals ok.  No MRI due to PPM.  Had CTA head/neck without large vessel occlusion or stenosis.  - Currently on ASA and Plavix.  6. Sick sinus syndrome: MDT dual chamber pacemaker, RV pacing about 12.5% of the time, not frequent enough to recommend CRT upgrade.   7. Prostate cancer: He has completed radiation therapy.  8. Emphysema/COPD: This has been noted on prior chest CTs.  His dyspnea improves when he uses albuterol.  I am concerned that his current exertional dyspnea is most likely due to COPD as from a cardiac perspective he seems very stable.  - I will start him on Spiriva.  - He sees a pulmonologist, Dr. Raul Del in [place].  I asked him to set up an appt to discuss COPD treatment. He will need PFTs.  9. PVCs: Frequent.  He was started on amiodarone by Dr. Caryl Comes, no PVCs on today's ECG.  - Continue amiodarone, will need to follow LFTs and TSH, will also need regular eye exam.   Followup in 3 months.   Loralie Champagne 08/27/2020

## 2020-08-27 NOTE — Progress Notes (Signed)
Daily Session Note  Patient Details  Name: Kristopher Fernandez MRN: [id number] Date of Birth: Apr 06, 1932 Referring Provider:     Cardiac Rehab from 07/21/2020 in [facility]  Referring Provider Aundra Dubin      Encounter Date: 08/27/2020  Check In:  Session Check In - 08/27/20 1344      Check-In   Supervising physician immediately available to respond to emergencies See telemetry face sheet for immediately available ER MD    Location [facility]    Staff Present Renita Papa, RN Margurite Auerbach, MS Exercise Physiologist;Amanda Oletta Darter, BA, ACSM CEP, Exercise Physiologist;Melissa Caiola RDN, LDN    Virtual Visit No    Medication changes reported     No    Fall or balance concerns reported    No    Warm-up and Cool-down Performed on first and last piece of equipment    Resistance Training Performed Yes    VAD Patient? No    PAD/SET Patient? No      Pain Assessment   Currently in Pain? No/denies              Social History   Tobacco Use  Smoking Status Former Smoker  . Years: 10.00  . Types: Cigarettes  . Quit date: 63  . Years since quitting: 49.7  Smokeless Tobacco Former Systems developer  . Types: Chew  Tobacco Comment   Quit 1977    Goals Met:  Independence with exercise equipment Exercise tolerated well No report of cardiac concerns or symptoms Strength training completed today  Goals Unmet:  Not Applicable  Comments: Pt able to follow exercise prescription today without complaint.  Will continue to monitor for progression.    Dr. Emily Filbert is Medical Director for [facility].

## 2020-08-27 NOTE — Progress Notes (Signed)
Cardiac Individual Treatment Plan  Patient Details  Name: Kristopher Fernandez MRN: [id number] Date of Birth: April 08, 1932 Referring Provider:     Cardiac Rehab from 07/21/2020 in [facility] Cardiac and Pulmonary Rehab  Referring Provider Aundra Dubin      Initial Encounter Date:    Cardiac Rehab from 07/21/2020 in [facility] Cardiac and Pulmonary Rehab  Date 07/21/20      Visit Diagnosis: S/P TAVR (transcatheter aortic valve replacement)  Patient's Home Medications on Admission:  Current Outpatient Medications:  .  albuterol (VENTOLIN HFA) 108 (90 Base) MCG/ACT inhaler, Inhale 1-2 puffs into the lungs every 6 (six) hours as needed for wheezing or shortness of breath., Disp: 1 each, Rfl: 0 .  amiodarone (PACERONE) 200 MG tablet, Take 2 tablets (400 mg) by mouth TWICE daily x 2 weeks, then take 1 tablet (200 mg) by mouth TWICE daily x 2 weeks, Disp: 90 tablet, Rfl: 0 .  aspirin 81 MG chewable tablet, Chew 1 tablet (81 mg total) by mouth daily., Disp: , Rfl:  .  atorvastatin (LIPITOR) 80 MG tablet, Take 80 mg by mouth daily at 6 PM. , Disp: , Rfl:  .  carvedilol (COREG) 6.25 MG tablet, TAKE 1 TABLET BY MOUTH 2 TIMES DAILY WITH A MEAL, Disp: 60 tablet, Rfl: 5 .  clopidogrel (PLAVIX) 75 MG tablet, Take 1 tablet (75 mg total) by mouth daily., Disp: 90 tablet, Rfl: 1 .  furosemide (LASIX) 20 MG tablet, Take 1 tablet (20 mg total) by mouth as needed (weight. Take one tablet if weight increases by 3 pounds overnight or 5 pounds in one week.)., Disp: 30 tablet, Rfl: 0 .  ipratropium-albuterol (DUONEB) 0.5-2.5 (3) MG/3ML SOLN, Take 3 mLs by nebulization every 6 (six) hours as needed., Disp: , Rfl:  .  losartan (COZAAR) 25 MG tablet, Take 0.5 tablets (12.5 mg total) by mouth daily., Disp: 15 tablet, Rfl: 3 .  spironolactone (ALDACTONE) 25 MG tablet, Take 1 tablet (25 mg total) by mouth at bedtime., Disp: 90 tablet, Rfl: 3 .  tiotropium (SPIRIVA HANDIHALER) 18 MCG inhalation capsule, Place 1 capsule (18 mcg total) into inhaler  and inhale daily., Disp: 30 capsule, Rfl: 0 .  venlafaxine (EFFEXOR) 75 MG tablet, Take 75 mg by mouth as needed., Disp: , Rfl:   Past Medical History: Past Medical History:  Diagnosis Date  . Anginal pain ([place])   . Aortic stenosis   . Aortic stenosis   . Basal cell carcinoma, ear 03/31/2015  . Bradycardia 07/03/2014  . CHF (congestive heart failure) ([place])   . DDD (degenerative disc disease), lumbar 08/06/2015  . H/O cardiac catheterization 07/03/2014   Overview:  Cypher stent proximal and distal RCA 07/18/05 and TAXUS stent mid LAD 07/20/05 at [facility]  . HTN (hypertension) 07/03/2014  . Hyperlipidemia 07/03/2014  . Ischemic cardiomyopathy   . Lumbosacral radiculopathy at S1 08/22/2015  . MI (myocardial infarction) ([place]) 07/03/2014   Overview:  Mi 07/17/05  . Myocardial infarction ([place])   . Normocytic normochromic anemia 09/14/2015  . Pacemaker 07/03/2014   Overview:  Dual chamber pacemaker 08/11/11  . Pancreatic mass    not hypermetabolic on PET-CT imaging  . Prostate cancer ([place])    metastatic  . 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 Shortness of breath dyspnea   . SOB (shortness of breath) on exertion 05/13/2016    Tobacco Use: Social History  Tobacco Use  Smoking Status Former Smoker  . Years: 10.00  . Types: Cigarettes  . Quit date: 68  . Years since quitting: 49.7  Smokeless Tobacco Former Systems developer  . Types: Chew  Tobacco Comment   Quit 1977    Labs: Recent Review Flowsheet Data    Labs for ITP Cardiac and Pulmonary Rehab Latest Ref Rng & Units 05/22/2020 06/13/2020 06/17/2020 06/17/2020 06/17/2020   Cholestrol 0 - 200 mg/dL - - - - -   LDLCALC 0 - 99 mg/dL - - - - -   HDL >40 mg/dL - - - - -   Trlycerides <150 mg/dL - - - - -   Hemoglobin A1c 4.8 - 5.6 % - 5.6 - - -   PHART 7.35 - 7.45 - 7.425 7.324(L) - -   PCO2ART 32 - 48 mmHg - 37.7 53.1(H) - -   HCO3 20.0 -  28.0 mmol/L 24.7 24.3 27.6 - -   TCO2 22 - 32 mmol/L 26 - $Re'29 28 22   'eIK$ ACIDBASEDEF 0.0 - 2.0 mmol/L 1.0 - - - -   O2SAT % 64.0 94.3 99.0 - -       Exercise Target Goals: Exercise Program Goal: Individual exercise prescription set using results from initial 6 min walk test and THRR while considering  patient's activity barriers and safety.   Exercise Prescription Goal: Initial exercise prescription builds to 30-45 minutes a day of aerobic activity, 2-3 days per week.  Home exercise guidelines will be given to patient during program as part of exercise prescription that the participant will acknowledge.   Education: Aerobic Exercise & Resistance Training: - Gives group verbal and written instruction on the various components of exercise. Focuses on aerobic and resistive training programs and the benefits of this training and how to safely progress through these programs..   Education: Exercise & Equipment Safety: - Individual verbal instruction and demonstration of equipment use and safety with use of the equipment.   Cardiac Rehab from 07/21/2020 in [facility] Cardiac and Pulmonary Rehab  Date 07/21/20  Educator AS  Instruction Review Code 1- Verbalizes Understanding      Education: Exercise Physiology & General Exercise Guidelines: - Group verbal and written instruction with models to review the exercise physiology of the cardiovascular system and associated critical values. Provides general exercise guidelines with specific guidelines to those with heart or lung disease.    Education: Flexibility, Balance, Mind/Body Relaxation: Provides group verbal/written instruction on the benefits of flexibility and balance training, including mind/body exercise modes such as yoga, pilates and tai chi.  Demonstration and skill practice provided.   Activity Barriers & Risk Stratification:  Activity Barriers & Cardiac Risk Stratification - 07/17/20 1506      Activity Barriers & Cardiac Risk  Stratification   Activity Barriers Shortness of Breath;Deconditioning   Uses a cane   Cardiac Risk Stratification Low           6 Minute Walk:  6 Minute Walk    Row Name 07/21/20 1505         6 Minute Walk   Phase Initial     Distance 655 feet     Walk Time 6 minutes     # of Rest Breaks 0     MPH 1.24     METS 1     RPE 13     Perceived Dyspnea  2     VO2 Peak 3.5     Symptoms No     Resting  HR 77 bpm     Resting BP 112/66     Resting Oxygen Saturation  94 %     Exercise Oxygen Saturation  during 6 min walk 88 %     Max Ex. HR 91 bpm     Max Ex. BP 138/66     2 Minute Post BP 118/68            Oxygen Initial Assessment:   Oxygen Re-Evaluation:   Oxygen Discharge (Final Oxygen Re-Evaluation):   Initial Exercise Prescription:  Initial Exercise Prescription - 07/21/20 1500      Date of Initial Exercise RX and Referring Provider   Date 07/21/20    Referring Provider Aundra Dubin      Treadmill   MPH 1    Grade 0    Minutes 15    METs 1.77      Recumbant Bike   Level 1    RPM 60    Minutes 15    METs 1      NuStep   Level 1    SPM 80    Minutes 15    METs 1      Prescription Details   Frequency (times per week) 3    Duration Progress to 30 minutes of continuous aerobic without signs/symptoms of physical distress      Intensity   THRR 40-80% of Max Heartrate 100-123    Ratings of Perceived Exertion 11-13    Perceived Dyspnea 0-4      Resistance Training   Training Prescription Yes    Weight 3 lb    Reps 10-15           Perform Capillary Blood Glucose checks as needed.  Exercise Prescription Changes:  Exercise Prescription Changes    Row Name 07/21/20 1500 07/30/20 1400 08/13/20 1400 08/26/20 1400       Response to Exercise   Blood Pressure (Admit) 112/66 108/66 108/64 126/62    Blood Pressure (Exercise) 138/66 128/70 126/74 124/64    Blood Pressure (Exit) 118/68 122/74 114/64 134/60    Heart Rate (Admit) 77 bpm 59 bpm 77 bpm 71  bpm    Heart Rate (Exercise) 91 bpm 95 bpm 98 bpm 98 bpm    Heart Rate (Exit) 82 bpm 103 bpm 94 bpm 93 bpm    Oxygen Saturation (Admit) 94 % -- -- --    Oxygen Saturation (Exercise) 88 % -- -- --    Oxygen Saturation (Exit) 94 % -- -- --    Rating of Perceived Exertion (Exercise) 13 13 10 13     Perceived Dyspnea (Exercise) 2 -- -- --    Symptoms none none none none    Duration -- Continue with 30 min of aerobic exercise without signs/symptoms of physical distress. Continue with 30 min of aerobic exercise without signs/symptoms of physical distress. Continue with 30 min of aerobic exercise without signs/symptoms of physical distress.    Intensity -- THRR unchanged THRR unchanged THRR unchanged      Progression   Progression -- Continue to progress workloads to maintain intensity without signs/symptoms of physical distress. Continue to progress workloads to maintain intensity without signs/symptoms of physical distress. Continue to progress workloads to maintain intensity without signs/symptoms of physical distress.    Average METs -- 1.65 2.37 2.5      Resistance Training   Training Prescription -- Yes Yes Yes    Weight -- 3 lb 4 lb 4 lb    Reps -- 10-15 10-15 10-15  Interval Training   Interval Training -- -- No No      Treadmill   MPH -- 1.9 1.9 --    Grade -- 0 0 --    Minutes -- 15 15 --    METs -- 2.45 2.45 --      Recumbant Bike   Level -- -- 1.1 1    RPM -- -- -- 60    Minutes -- -- 15 15    METs -- -- -- 2.6      NuStep   Level -- $Remove'1 3 3    'sdnFJsm$ SPM -- 80 -- 80    Minutes -- $RemoveBe'15 15 15    'RaJHCrMQk$ METs -- 1.7 2.3 2.4      Home Exercise Plan   Plans to continue exercise at -- -- Home (comment)  walking, leg PT exercises Home (comment)  walking, leg PT exercises    Frequency -- -- Add 3 additional days to program exercise sessions. Add 3 additional days to program exercise sessions.    Initial Home Exercises Provided -- -- 08/07/20 08/07/20           Exercise  Comments:   Exercise Goals and Review:  Exercise Goals    Row Name 07/21/20 1516             Exercise Goals   Increase Physical Activity Yes       Intervention Provide advice, education, support and counseling about physical activity/exercise needs.;Develop an individualized exercise prescription for aerobic and resistive training based on initial evaluation findings, risk stratification, comorbidities and participant's personal goals.       Expected Outcomes Short Term: Attend rehab on a regular basis to increase amount of physical activity.;Long Term: Add in home exercise to make exercise part of routine and to increase amount of physical activity.;Long Term: Exercising regularly at least 3-5 days a week.       Increase Strength and Stamina Yes       Intervention Provide advice, education, support and counseling about physical activity/exercise needs.;Develop an individualized exercise prescription for aerobic and resistive training based on initial evaluation findings, risk stratification, comorbidities and participant's personal goals.       Expected Outcomes Short Term: Increase workloads from initial exercise prescription for resistance, speed, and METs.;Short Term: Perform resistance training exercises routinely during rehab and add in resistance training at home;Long Term: Improve cardiorespiratory fitness, muscular endurance and strength as measured by increased METs and functional capacity (6MWT)       Able to understand and use rate of perceived exertion (RPE) scale Yes       Intervention Provide education and explanation on how to use RPE scale       Expected Outcomes Short Term: Able to use RPE daily in rehab to express subjective intensity level;Long Term:  Able to use RPE to guide intensity level when exercising independently       Able to understand and use Dyspnea scale Yes       Intervention Provide education and explanation on how to use Dyspnea scale       Expected Outcomes  Short Term: Able to use Dyspnea scale daily in rehab to express subjective sense of shortness of breath during exertion;Long Term: Able to use Dyspnea scale to guide intensity level when exercising independently       Knowledge and understanding of Target Heart Rate Range (THRR) Yes       Intervention Provide education and explanation of THRR including how the numbers  were predicted and where they are located for reference       Expected Outcomes Short Term: Able to state/look up THRR;Short Term: Able to use daily as guideline for intensity in rehab;Long Term: Able to use THRR to govern intensity when exercising independently       Able to check pulse independently Yes       Intervention Provide education and demonstration on how to check pulse in carotid and radial arteries.;Review the importance of being able to check your own pulse for safety during independent exercise       Expected Outcomes Short Term: Able to explain why pulse checking is important during independent exercise;Long Term: Able to check pulse independently and accurately       Understanding of Exercise Prescription Yes       Intervention Provide education, explanation, and written materials on patient's individual exercise prescription       Expected Outcomes Short Term: Able to explain program exercise prescription;Long Term: Able to explain home exercise prescription to exercise independently              Exercise Goals Re-Evaluation :  Exercise Goals Re-Evaluation    Row Name 07/23/20 1341 07/30/20 1438 08/07/20 1347 08/13/20 1440 08/26/20 1500     Exercise Goal Re-Evaluation   Exercise Goals Review Increase Physical Activity;Able to understand and use rate of perceived exertion (RPE) scale;Knowledge and understanding of Target Heart Rate Range (THRR);Understanding of Exercise Prescription;Increase Strength and Stamina;Able to check pulse independently Increase Physical Activity;Able to understand and use rate of perceived  exertion (RPE) scale;Knowledge and understanding of Target Heart Rate Range (THRR);Understanding of Exercise Prescription;Increase Strength and Stamina;Able to check pulse independently Increase Physical Activity;Increase Strength and Stamina;Understanding of Exercise Prescription Increase Physical Activity;Increase Strength and Stamina;Understanding of Exercise Prescription Increase Physical Activity;Increase Strength and Stamina;Understanding of Exercise Prescription   Comments Reviewed RPE and dyspnea scales, THR and program prescription with pt today.  Pt voiced understanding and was given a copy of goals to take home. Gar has tolerated exercise well in his first week of attendance. Staff will monitor progress. Kaydan is doing well in rehab.  He has already started to notice an increase in his lower body strength.  He is following his routine for his legs strength.  His biggest limitation continues to be his breathing. He is using his PLB to help.  Reviewed home exercise with pt today.  Pt plans to walk and continue with leg exercises at home for exercise.  Reviewed THR, pulse, RPE, sign and symptoms, pulse oximetery and when to call 911 or MD.  Also discussed weather considerations and indoor options.  Pt voiced understanding. Zhyon continues to do well in rehab.  He is up to 4 lb handweights.  We will continue to monitor his progress. Jaquae is progressing well and is up to level 3 on NS.  He attends consistently and works at correct RPE.   Expected Outcomes Short: Use RPE daily to regulate intensity. Long: Follow program prescription in THR. Short:  attend consistently Long: improve overall stamina Short: Start to add in walking at home Long: Continue to improve stamina. Short: Continue to attend consistently Long: Continue to walk at home on off days. Short: Continue to attend consistently Long: increase overall MET level          Discharge Exercise Prescription (Final Exercise Prescription  Changes):  Exercise Prescription Changes - 08/26/20 1400      Response to Exercise   Blood Pressure (Admit) 126/62  Blood Pressure (Exercise) 124/64    Blood Pressure (Exit) 134/60    Heart Rate (Admit) 71 bpm    Heart Rate (Exercise) 98 bpm    Heart Rate (Exit) 93 bpm    Rating of Perceived Exertion (Exercise) 13    Symptoms none    Duration Continue with 30 min of aerobic exercise without signs/symptoms of physical distress.    Intensity THRR unchanged      Progression   Progression Continue to progress workloads to maintain intensity without signs/symptoms of physical distress.    Average METs 2.5      Resistance Training   Training Prescription Yes    Weight 4 lb    Reps 10-15      Interval Training   Interval Training No      Recumbant Bike   Level 1    RPM 60    Minutes 15    METs 2.6      NuStep   Level 3    SPM 80    Minutes 15    METs 2.4      Home Exercise Plan   Plans to continue exercise at Home (comment)   walking, leg PT exercises   Frequency Add 3 additional days to program exercise sessions.    Initial Home Exercises Provided 08/07/20           Nutrition:  Target Goals: Understanding of nutrition guidelines, daily intake of sodium '1500mg'$ , cholesterol '200mg'$ , calories 30% from fat and 7% or less from saturated fats, daily to have 5 or more servings of fruits and vegetables.  Education: Controlling Sodium/Reading Food Labels -Group verbal and written material supporting the discussion of sodium use in heart healthy nutrition. Review and explanation with models, verbal and written materials for utilization of the food label.   Education: General Nutrition Guidelines/Fats and Fiber: -Group instruction provided by verbal, written material, models and posters to present the general guidelines for heart healthy nutrition. Gives an explanation and review of dietary fats and fiber.   Biometrics:    Nutrition Therapy Plan and Nutrition Goals:   Nutrition Therapy & Goals - 07/21/20 1403      Nutrition Therapy   Diet High calorie, High protein pulmonary MNT diet.    Protein (specify units) 80-85g    Fiber 30 grams    Whole Grain Foods 3 servings    Saturated Fats 12 max. grams    Fruits and Vegetables 5 servings/day    Sodium 1.5 grams      Personal Nutrition Goals   Nutrition Goal ST: increase protein at meals LT: gain strength    Comments Pt reports loving eggs and cereal (raisin bran) - whole milk or waffles or pancake. L: sandwich (tomato right now) (white bread) or corn on the cobb or some beans (pintos) D: pork and beans. Discussed Pulmonary Nutrition, heart healthy nutrition, and high protein nutrition. His COPD is newly diagnosed and he is in remission of cancer - treatments ended in May. Pt reports not getting strength back.      Intervention Plan   Intervention Prescribe, educate and counsel regarding individualized specific dietary modifications aiming towards targeted core components such as weight, hypertension, lipid management, diabetes, heart failure and other comorbidities.;Nutrition handout(s) given to patient.    Expected Outcomes Short Term Goal: Understand basic principles of dietary content, such as calories, fat, sodium, cholesterol and nutrients.;Long Term Goal: Adherence to prescribed nutrition plan.;Short Term Goal: A plan has been developed with personal nutrition goals set  during dietitian appointment.           Nutrition Assessments:  Nutrition Assessments - 07/21/20 1526      MEDFICTS Scores   Pre Score 52           MEDIFICTS Score Key:          ?70 Need to make dietary changes          40-70 Heart Healthy Diet         ? 40 Therapeutic Level Cholesterol Diet  Nutrition Goals Re-Evaluation:  Nutrition Goals Re-Evaluation    [place] Name 08/20/20 1343             Goals   Nutrition Goal ST: increase protein at meals LT: gain strength       Comment continue with current changes. Pt is  currently grieving the loss of his neighbor.       Expected Outcome ST: increase protein at meals LT: gain strength              Nutrition Goals Discharge (Final Nutrition Goals Re-Evaluation):  Nutrition Goals Re-Evaluation - 08/20/20 1343      Goals   Nutrition Goal ST: increase protein at meals LT: gain strength    Comment continue with current changes. Pt is currently grieving the loss of his neighbor.    Expected Outcome ST: increase protein at meals LT: gain strength           Psychosocial: Target Goals: Acknowledge presence or absence of significant depression and/or stress, maximize coping skills, provide positive support system. Participant is able to verbalize types and ability to use techniques and skills needed for reducing stress and depression.   Education: Depression - Provides group verbal and written instruction on the correlation between heart/lung disease and depressed mood, treatment options, and the stigmas associated with seeking treatment.   Education: Sleep Hygiene -Provides group verbal and written instruction about how sleep can affect your health.  Define sleep hygiene, discuss sleep cycles and impact of sleep habits. Review good sleep hygiene tips.     Education: Stress and Anxiety: - Provides group verbal and written instruction about the health risks of elevated stress and causes of high stress.  Discuss the correlation between heart/lung disease and anxiety and treatment options. Review healthy ways to manage with stress and anxiety.    Initial Review & Psychosocial Screening:  Initial Psych Review & Screening - 07/17/20 1512      Initial Review   Current issues with Current Stress Concerns    Source of Stress Concerns Chronic Illness    Comments Just finished cancer treament May   still weak SOB,  then TAVR    wants to feel stronger   improve SOB      Family Dynamics   Good Support System? Yes   Daughter  [name]   Concerns Recent loss of  significant other    Comments Wife of almost 10 years passed away in 03-09-19      Barriers   Psychosocial barriers to participate in program There are no identifiable barriers or psychosocial needs.      Screening Interventions   Interventions Encouraged to exercise;Provide feedback about the scores to participant;To provide support and resources with identified psychosocial needs    Expected Outcomes Short Term goal: Utilizing psychosocial counselor, staff and physician to assist with identification of specific Stressors or current issues interfering with healing process. Setting desired goal for each stressor or current issue identified.;Long Term Goal: Stressors or  current issues are controlled or eliminated.;Short Term goal: Identification and review with participant of any Quality of Life or Depression concerns found by scoring the questionnaire.;Long Term goal: The participant improves quality of Life and PHQ9 Scores as seen by post scores and/or verbalization of changes           Quality of Life Scores:   Quality of Life - 07/21/20 1519      Quality of Life   Select Quality of Life      Quality of Life Scores   Health/Function Pre 26.14 %    Socioeconomic Pre 30 %    Psych/Spiritual Pre 28.29 %    Family Pre 30 %    GLOBAL Pre 28 %          Scores of 19 and below usually indicate a poorer quality of life in these areas.  A difference of  2-3 points is a clinically meaningful difference.  A difference of 2-3 points in the total score of the Quality of Life Index has been associated with significant improvement in overall quality of life, self-image, physical symptoms, and general health in studies assessing change in quality of life.  PHQ-9: Recent Review Flowsheet Data    Depression screen [facility] 2/9 08/20/2020 07/21/2020 07/11/2020   Decreased Interest 0 0 0   Down, Depressed, Hopeless 0 0 0   PHQ - 2 Score 0 0 0   Altered sleeping 1 3  -   Tired, decreased energy 2 3  -   Change  in appetite 1 0 -   Feeling bad or failure about yourself  0 0 -   Trouble concentrating 0 0 -   Moving slowly or fidgety/restless 1 0 -   Suicidal thoughts 0 0 -   PHQ-9 Score 5 6 -   Difficult doing work/chores Not difficult at all - -     Interpretation of Total Score  Total Score Depression Severity:  1-4 = Minimal depression, 5-9 = Mild depression, 10-14 = Moderate depression, 15-19 = Moderately severe depression, 20-27 = Severe depression   Psychosocial Evaluation and Intervention:  Psychosocial Evaluation - 07/17/20 1519      Psychosocial Evaluation & Interventions   Interventions Encouraged to exercise with the program and follow exercise prescription    Comments Panfilo has no barriers to attending the prgram. He hopes to impove his energy levels and decrease his SOB.  He recently completed Prostate Cancer treatments and current TAVR.  He lives alone as his wife of almost 28 years passed away in 02/28/2019. His daughter [name] checks on him several times a day. He should do well in the program.    Expected Outcomes STG: Carolyn attends all scheduled sessions  LTG: Kord sees improvement in his energy and SOB symptoms. He is able to utilize the exercise and education learned in program to keep his progress moving forward.    Continue Psychosocial Services  Follow up required by staff           Psychosocial Re-Evaluation:  Psychosocial Re-Evaluation    [place] Name 08/07/20 February 28, 1351             Psychosocial Re-Evaluation   Current issues with Current Stress Concerns       Comments Trenton's neighbor just passed away this week and was a close friend.  He is helping the daughters plan the funeral.  He enjoys spending time with his kids and grandkids.  He is sleeping well, but his biggest concern is his  breathing .  He often feels short of breath with activity, he does feel when he using PLB.       Expected Outcomes Short: Cope with loss of friend  Long: Continue to focus on the positive.               Psychosocial Discharge (Final Psychosocial Re-Evaluation):  Psychosocial Re-Evaluation - 08/07/20 1352      Psychosocial Re-Evaluation   Current issues with Current Stress Concerns    Comments Izaha's neighbor just passed away this week and was a close friend.  He is helping the daughters plan the funeral.  He enjoys spending time with his kids and grandkids.  He is sleeping well, but his biggest concern is his breathing .  He often feels short of breath with activity, he does feel when he using PLB.    Expected Outcomes Short: Cope with loss of friend  Long: Continue to focus on the positive.           Vocational Rehabilitation: Provide vocational rehab assistance to qualifying candidates.   Vocational Rehab Evaluation & Intervention:  Vocational Rehab - 07/17/20 1514      Initial Vocational Rehab Evaluation & Intervention   Assessment shows need for Vocational Rehabilitation No           Education: Education Goals: Education classes will be provided on a variety of topics geared toward better understanding of heart health and risk factor modification. Participant will state understanding/return demonstration of topics presented as noted by education test scores.  Learning Barriers/Preferences:  Learning Barriers/Preferences - 07/17/20 1519      Learning Barriers/Preferences   Learning Barriers None    Learning Preferences None           General Cardiac Education Topics:  AED/CPR: - Group verbal and written instruction with the use of models to demonstrate the basic use of the AED with the basic ABC's of resuscitation.   Anatomy & Physiology of the Heart: - Group verbal and written instruction and models provide basic cardiac anatomy and physiology, with the coronary electrical and arterial systems. Review of Valvular disease and Heart Failure   Cardiac Procedures: - Group verbal and written instruction to review commonly prescribed medications for heart  disease. Reviews the medication, class of the drug, and side effects. Includes the steps to properly store meds and maintain the prescription regimen. (beta blockers and nitrates)   Cardiac Medications I: - Group verbal and written instruction to review commonly prescribed medications for heart disease. Reviews the medication, class of the drug, and side effects. Includes the steps to properly store meds and maintain the prescription regimen.   Cardiac Medications II: -Group verbal and written instruction to review commonly prescribed medications for heart disease. Reviews the medication, class of the drug, and side effects. (all other drug classes)    Go Sex-Intimacy & Heart Disease, Get SMART - Goal Setting: - Group verbal and written instruction through game format to discuss heart disease and the return to sexual intimacy. Provides group verbal and written material to discuss and apply goal setting through the application of the S.M.A.R.T. Method.   Other Matters of the Heart: - Provides group verbal, written materials and models to describe Stable Angina and Peripheral Artery. Includes description of the disease process and treatment options available to the cardiac patient.   Infection Prevention: - Provides verbal and written material to individual with discussion of infection control including proper hand washing and proper equipment cleaning during exercise session.  Cardiac Rehab from 07/21/2020 in [facility]  Date 07/21/20  Educator AS  Instruction Review Code 1- Verbalizes Understanding      Falls Prevention: - Provides verbal and written material to individual with discussion of falls prevention and safety.   Cardiac Rehab from 07/21/2020 in [facility] Cardiac and Pulmonary Rehab  Date 07/21/20  Educator AS  Instruction Review Code 1- Verbalizes Understanding      Other: -Provides group and verbal instruction on various topics (see  comments)   Knowledge Questionnaire Score:  Knowledge Questionnaire Score - 07/21/20 1523      Knowledge Questionnaire Score   Pre Score 21/26 heart rate nutrition           Core Components/Risk Factors/Patient Goals at Admission:  Personal Goals and Risk Factors at Admission - 07/17/20 1509      Core Components/Risk Factors/Patient Goals on Admission    Weight Management Yes;Weight Maintenance    Intervention Weight Management: Develop a combined nutrition and exercise program designed to reach desired caloric intake, while maintaining appropriate intake of nutrient and fiber, sodium and fats, and appropriate energy expenditure required for the weight goal.;Weight Management: Provide education and appropriate resources to help participant work on and attain dietary goals.    Admit Weight 182 lb (82.6 kg)    Goal Weight: Short Term 182 lb (82.6 kg)    Goal Weight: Long Term 182 lb (82.6 kg)    Expected Outcomes Short Term: Continue to assess and modify interventions until short term weight is achieved;Long Term: Adherence to nutrition and physical activity/exercise program aimed toward attainment of established weight goal;Weight Maintenance: Understanding of the daily nutrition guidelines, which includes 25-35% calories from fat, 7% or less cal from saturated fats, less than $RemoveB'200mg'CoPwkUDZ$  cholesterol, less than 1.5gm of sodium, & 5 or more servings of fruits and vegetables daily    Hypertension Yes    Intervention Provide education on lifestyle modifcations including regular physical activity/exercise, weight management, moderate sodium restriction and increased consumption of fresh fruit, vegetables, and low fat dairy, alcohol moderation, and smoking cessation.;Monitor prescription use compliance.    Expected Outcomes Short Term: Continued assessment and intervention until BP is < 140/37mm HG in hypertensive participants. < 130/69mm HG in hypertensive participants with diabetes, heart failure or  chronic kidney disease.;Long Term: Maintenance of blood pressure at goal levels.    Lipids Yes    Intervention Provide education and support for participant on nutrition & aerobic/resistive exercise along with prescribed medications to achieve LDL '70mg'$ , HDL >$Remo'40mg'cAeJw$ .    Expected Outcomes Short Term: Participant states understanding of desired cholesterol values and is compliant with medications prescribed. Participant is following exercise prescription and nutrition guidelines.;Long Term: Cholesterol controlled with medications as prescribed, with individualized exercise RX and with personalized nutrition plan. Value goals: LDL < $Rem'70mg'gvEK$ , HDL > 40 mg.           Education:Diabetes - Individual verbal and written instruction to review signs/symptoms of diabetes, desired ranges of glucose level fasting, after meals and with exercise. Acknowledge that pre and post exercise glucose checks will be done for 3 sessions at entry of program.   Education: Know Your Numbers and Risk Factors: -Group verbal and written instruction about important numbers in your health.  Discussion of what are risk factors and how they play a role in the disease process.  Review of Cholesterol, Blood Pressure, Diabetes, and BMI and the role they play in your overall health.   Core Components/Risk Factors/Patient Goals Review:  Goals and Risk Factor Review    Row Name 08/07/20 1349             Core Components/Risk Factors/Patient Goals Review   Personal Goals Review Weight Management/Obesity;Improve shortness of breath with ADL's;Hypertension;Lipids       Review Reymundo is doing well in rehab.  His weight is staying steady.  His breathing continues to be his biggest problem.  His pressures are doing well mostly.  Yesterday, it was low after class, but overall doing well.  He tries to check it at least twice a day.       Expected Outcomes Short: Continue to keep eye on blood pressure Long: Continue to monitor risk factors.               Core Components/Risk Factors/Patient Goals at Discharge (Final Review):   Goals and Risk Factor Review - 08/07/20 1349      Core Components/Risk Factors/Patient Goals Review   Personal Goals Review Weight Management/Obesity;Improve shortness of breath with ADL's;Hypertension;Lipids    Review Khang is doing well in rehab.  His weight is staying steady.  His breathing continues to be his biggest problem.  His pressures are doing well mostly.  Yesterday, it was low after class, but overall doing well.  He tries to check it at least twice a day.    Expected Outcomes Short: Continue to keep eye on blood pressure Long: Continue to monitor risk factors.           ITP Comments:  ITP Comments    Row Name 07/17/20 1523 07/21/20 1536 07/23/20 1340 07/30/20 0726 08/27/20 1634   ITP Comments Virtual orientation call completed today. he has an appointment on Date: 07/21/2020 for EP eval and gym Orientation.  Documentation of diagnosis can be found in [facility] Date: 06/17/2020. Completed 6MWT and gym orientation. Initial ITP created and sent for review to Dr. Emily Filbert, Medical Director. First full day of exercise!  Patient was oriented to gym and equipment including functions, settings, policies, and procedures.  Patient's individual exercise prescription and treatment plan were reviewed.  All starting workloads were established based on the results of the 6 minute walk test done at initial orientation visit.  The plan for exercise progression was also introduced and progression will be customized based on patient's performance and goals. 30 Day review completed. Medical Director ITP review done, changes made as directed, and signed approval by Medical Director. 30 day review completed. ITP sent to Dr. Emily Filbert, Medical Director of Cardiac and Pulmonary Rehab. Continue with ITP unless changes are made by physician.          Comments: 30 day review

## 2020-08-27 NOTE — Patient Instructions (Signed)
Increase Spironolactone to 25 mg Daily  We have provided you refills for your Albuterol and Spiriva inhalers, please follow up with Dr Raul Del in Pulmonology for further refills  Call Dr Gust Brooms office and schedule a follow up appointment  Labs done today, your results will be available in MyChart, we will contact you for abnormal readings.  Your physician recommends that you return for lab work in: 2 weeks, this can be done in [place]  Please call our office in December to schedule your follow up appointment  If you have any questions or concerns before your next appointment please send [place] a message through [place] or call our office at [phone].    TO LEAVE A MESSAGE FOR THE NURSE SELECT OPTION 2, PLEASE LEAVE A MESSAGE INCLUDING:  YOUR NAME  DATE OF BIRTH  CALL BACK NUMBER  REASON FOR CALL**this is important as we prioritize the call backs  YOU WILL RECEIVE A CALL BACK THE SAME DAY AS LONG AS YOU CALL BEFORE 4:00 PM  At the [facility], you and your health needs are our priority. As part of our continuing mission to provide you with exceptional heart care, we have created designated Provider Care Teams. These Care Teams include your primary Cardiologist (physician) and Advanced Practice Providers (APPs- Physician Assistants and Nurse Practitioners) who all work together to provide you with the care you need, when you need it.   You may see any of the following providers on your designated Care Team at your next follow up:  Dr Glori Bickers  Dr Haynes Kerns, NP  Lyda Jester, [place]  Audry Riles, PharmD   Please be sure to bring in all your medications bottles to every appointment.

## 2020-08-28 ENCOUNTER — Encounter: Payer: Medicare Other | Admitting: *Deleted

## 2020-08-28 ENCOUNTER — Ambulatory Visit: Admission: RE | Admit: 2020-08-28 | Payer: Medicare Other | Source: Ambulatory Visit

## 2020-08-28 ENCOUNTER — Inpatient Hospital Stay: Payer: Medicare Other | Attending: Oncology

## 2020-08-28 DIAGNOSIS — Z87891 Personal history of nicotine dependence: Secondary | ICD-10-CM | POA: Insufficient documentation

## 2020-08-28 DIAGNOSIS — K5730 Diverticulosis of large intestine without perforation or abscess without bleeding: Secondary | ICD-10-CM | POA: Insufficient documentation

## 2020-08-28 DIAGNOSIS — D649 Anemia, unspecified: Secondary | ICD-10-CM | POA: Diagnosis not present

## 2020-08-28 DIAGNOSIS — C61 Malignant neoplasm of prostate: Secondary | ICD-10-CM | POA: Diagnosis present

## 2020-08-28 DIAGNOSIS — Z952 Presence of prosthetic heart valve: Secondary | ICD-10-CM

## 2020-08-28 DIAGNOSIS — Z8249 Family history of ischemic heart disease and other diseases of the circulatory system: Secondary | ICD-10-CM | POA: Diagnosis not present

## 2020-08-28 DIAGNOSIS — K8689 Other specified diseases of pancreas: Secondary | ICD-10-CM | POA: Diagnosis not present

## 2020-08-28 DIAGNOSIS — Z79899 Other long term (current) drug therapy: Secondary | ICD-10-CM | POA: Insufficient documentation

## 2020-08-28 DIAGNOSIS — Z5111 Encounter for antineoplastic chemotherapy: Secondary | ICD-10-CM | POA: Insufficient documentation

## 2020-08-28 DIAGNOSIS — D696 Thrombocytopenia, unspecified: Secondary | ICD-10-CM | POA: Diagnosis not present

## 2020-08-28 DIAGNOSIS — I252 Old myocardial infarction: Secondary | ICD-10-CM | POA: Diagnosis not present

## 2020-08-28 DIAGNOSIS — E785 Hyperlipidemia, unspecified: Secondary | ICD-10-CM | POA: Insufficient documentation

## 2020-08-28 DIAGNOSIS — R5383 Other fatigue: Secondary | ICD-10-CM | POA: Insufficient documentation

## 2020-08-28 DIAGNOSIS — R531 Weakness: Secondary | ICD-10-CM | POA: Diagnosis not present

## 2020-08-28 DIAGNOSIS — Z923 Personal history of irradiation: Secondary | ICD-10-CM | POA: Insufficient documentation

## 2020-08-28 DIAGNOSIS — I700 Atherosclerosis of aorta: Secondary | ICD-10-CM | POA: Diagnosis not present

## 2020-08-28 DIAGNOSIS — D378 Neoplasm of uncertain behavior of other specified digestive organs: Secondary | ICD-10-CM | POA: Diagnosis not present

## 2020-08-28 DIAGNOSIS — N281 Cyst of kidney, acquired: Secondary | ICD-10-CM | POA: Diagnosis not present

## 2020-08-28 DIAGNOSIS — Z85828 Personal history of other malignant neoplasm of skin: Secondary | ICD-10-CM | POA: Insufficient documentation

## 2020-08-28 DIAGNOSIS — K8020 Calculus of gallbladder without cholecystitis without obstruction: Secondary | ICD-10-CM | POA: Insufficient documentation

## 2020-08-28 LAB — CBC WITH DIFFERENTIAL/PLATELET
Abs Immature Granulocytes: 0.05 10*3/uL (ref 0.00–0.07)
Basophils Absolute: 0 10*3/uL (ref 0.0–0.1)
Basophils Relative: 0 %
Eosinophils Absolute: 0.2 10*3/uL (ref 0.0–0.5)
Eosinophils Relative: 5 %
HCT: 32.8 % — ABNORMAL LOW (ref 39.0–52.0)
Hemoglobin: 11.1 g/dL — ABNORMAL LOW (ref 13.0–17.0)
Immature Granulocytes: 2 %
Lymphocytes Relative: 14 %
Lymphs Abs: 0.5 10*3/uL — ABNORMAL LOW (ref 0.7–4.0)
MCH: 30.4 pg (ref 26.0–34.0)
MCHC: 33.8 g/dL (ref 30.0–36.0)
MCV: 89.9 fL (ref 80.0–100.0)
Monocytes Absolute: 0.4 10*3/uL (ref 0.1–1.0)
Monocytes Relative: 13 %
Neutro Abs: 2.3 10*3/uL (ref 1.7–7.7)
Neutrophils Relative %: 66 %
Platelets: 115 10*3/uL — ABNORMAL LOW (ref 150–400)
RBC: 3.65 MIL/uL — ABNORMAL LOW (ref 4.22–5.81)
RDW: 12.9 % (ref 11.5–15.5)
WBC: 3.4 10*3/uL — ABNORMAL LOW (ref 4.0–10.5)
nRBC: 0 % (ref 0.0–0.2)

## 2020-08-28 LAB — BASIC METABOLIC PANEL
Anion gap: 8 (ref 5–15)
BUN: 26 mg/dL — ABNORMAL HIGH (ref 8–23)
CO2: 26 mmol/L (ref 22–32)
Calcium: 8.7 mg/dL — ABNORMAL LOW (ref 8.9–10.3)
Chloride: 103 mmol/L (ref 98–111)
Creatinine, Ser: 1.31 mg/dL — ABNORMAL HIGH (ref 0.61–1.24)
GFR calc Af Amer: 57 mL/min — ABNORMAL LOW (ref >60)
GFR calc non Af Amer: 49 mL/min — ABNORMAL LOW (ref >60)
Glucose, Bld: 111 mg/dL — ABNORMAL HIGH (ref 70–99)
Potassium: 4.2 mmol/L (ref 3.5–5.1)
Sodium: 137 mmol/L (ref 135–145)

## 2020-08-28 LAB — PSA: Prostatic Specific Antigen: 1.03 ng/mL (ref 0.00–4.00)

## 2020-08-28 NOTE — Progress Notes (Signed)
Daily Session Note  Patient Details  Name: Kristopher Fernandez MRN: [id number] Date of Birth: [birth year] Referring Provider:     Cardiac Rehab from 07/21/2020 in [facility] Cardiac and Pulmonary Rehab  Referring Provider Aundra Dubin      Encounter Date: 08/28/2020  Check In:  Session Check In - 08/28/20 1339      Check-In   Supervising physician immediately available to respond to emergencies See telemetry face sheet for immediately available ER MD    Location [facility]-Cardiac & Pulmonary Rehab    Staff Present Renita Papa, RN BSN;Joseph Foy [place], [place], ACSM CEP, Exercise Physiologist    Virtual Visit No    Medication changes reported     No    Fall or balance concerns reported    No    Warm-up and Cool-down Performed on first and last piece of equipment    Resistance Training Performed Yes    VAD Patient? No    PAD/SET Patient? No      Pain Assessment   Currently in Pain? No/denies              Social History   Tobacco Use  Smoking Status Former Smoker  . Years: 10.00  . Types: Cigarettes  . Quit date: 39  . Years since quitting: 49.7  Smokeless Tobacco Former Systems developer  . Types: Chew  Tobacco Comment   Quit 1977    Goals Met:  Independence with exercise equipment Exercise tolerated well No report of cardiac concerns or symptoms Strength training completed today  Goals Unmet:  Not Applicable  Comments: Pt able to follow exercise prescription today without complaint.  Will continue to monitor for progression.    Dr. Emily Filbert is Medical Director for [place] and [facility].

## 2020-08-29 ENCOUNTER — Ambulatory Visit: Payer: Medicare Other

## 2020-08-29 ENCOUNTER — Ambulatory Visit
Admission: RE | Admit: 2020-08-29 | Discharge: 2020-08-29 | Disposition: A | Discharge status: Home / Self Care | Payer: Medicare Other | Source: Ambulatory Visit | Attending: Oncology | Admitting: Oncology

## 2020-08-29 ENCOUNTER — Ambulatory Visit: Payer: Medicare Other | Admitting: Oncology

## 2020-08-29 ENCOUNTER — Other Ambulatory Visit: Payer: Self-pay

## 2020-08-29 ENCOUNTER — Telehealth (HOSPITAL_COMMUNITY): Payer: Self-pay

## 2020-08-29 DIAGNOSIS — I5022 Chronic systolic (congestive) heart failure: Secondary | ICD-10-CM

## 2020-08-29 DIAGNOSIS — K8689 Other specified diseases of pancreas: Secondary | ICD-10-CM | POA: Insufficient documentation

## 2020-08-29 LAB — CANCER ANTIGEN 19-9: CA 19-9: 192 U/mL — ABNORMAL HIGH (ref 0–35)

## 2020-08-29 MED ORDER — IOHEXOL 300 MG/ML  SOLN
100.0000 mL | Freq: Once | INTRAMUSCULAR | Status: AC | PRN
  Administered 2020-08-29: 75 mL via INTRAVENOUS

## 2020-08-29 NOTE — Telephone Encounter (Signed)
-----   Message from Larey Dresser, MD sent at 08/27/2020  1:32 PM EDT ----- Creatinine is a little higher, increase po hydration.  Will need to be careful with increase in spironolactone, will need BMET next week.

## 2020-08-29 NOTE — Telephone Encounter (Signed)
Patient advised and verbalized understanding. He will have repeat labs done at [facility] in [place]. Lab order entered and faxed to [facility] This Encounter  Procedures  . Basic Metabolic Panel (BMET)    Standing Status:   Future    Standing Expiration Date:   08/29/2021    Order Specific Question:   Release to patient    Answer:   Immediate

## 2020-08-30 NOTE — Progress Notes (Signed)
[place]  [phone] [phone]  ID: Kristopher Fernandez OB: 1932-01-27  MR#: [id number]  PTW#:[id number]  Patient Care Team: Tracie Harrier, MD as PCP - General (Internal Medicine) Larey Dresser, MD as Consulting Physician (Cardiology)  CHIEF COMPLAINT: Prostate cancer, pancreatic mass.  INTERVAL HISTORY: Patient returns to clinic today for further evaluation, discussion of her imaging results, and continuation of Eligard.  He has chronic weakness and fatigue, but otherwise feels well.  He continues to remain active.  He has no neurologic complaints.  He denies any recent fevers or illnesses.  He has a good appetite and denies weight loss.  He does not complain of pain.  He has no chest pain, shortness of breath, cough, or hemoptysis.  He denies any nausea, vomiting, constipation, or diarrhea.  He has no urinary complaints.  Patient feels at his baseline offers no further specific complaints today.  REVIEW OF SYSTEMS:   Review of Systems  Constitutional: Positive for malaise/fatigue. Negative for fever and weight loss.  Respiratory: Negative.  Negative for cough, hemoptysis and shortness of breath.   Cardiovascular: Negative.  Negative for chest pain and leg swelling.  Gastrointestinal: Negative.  Negative for abdominal pain.  Genitourinary: Negative.  Negative for dysuria.  Musculoskeletal: Negative.  Negative for back pain.  Skin: Negative.  Negative for rash.  Neurological: Positive for weakness. Negative for dizziness, focal weakness and headaches.  Psychiatric/Behavioral: Negative.  The patient is not nervous/anxious.     As per HPI. Otherwise, a complete review of systems is negative.  PAST MEDICAL HISTORY: Past Medical History:  Diagnosis Date  . Anginal pain ([place])   . Aortic stenosis   . Aortic stenosis   . Basal cell carcinoma, ear 03/31/2015  . Bradycardia 07/03/2014  . CHF (congestive heart failure) ([place])   . DDD (degenerative  disc disease), lumbar 08/06/2015  . H/O cardiac catheterization 07/03/2014   Overview:  Cypher stent proximal and distal RCA 07/18/05 and TAXUS stent mid LAD 07/20/05 at [facility]  . HTN (hypertension) 07/03/2014  . Hyperlipidemia 07/03/2014  . Ischemic cardiomyopathy   . Lumbosacral radiculopathy at S1 08/22/2015  . MI (myocardial infarction) ([place]) 07/03/2014   Overview:  Mi 07/17/05  . Myocardial infarction ([place])   . Normocytic normochromic anemia 09/14/2015  . Pacemaker 07/03/2014   Overview:  Dual chamber pacemaker 08/11/11  . Pancreatic mass    not hypermetabolic on PET-CT imaging  . Prostate cancer ([place])    metastatic  . 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 Shortness of breath dyspnea   . SOB (shortness of breath) on exertion 05/13/2016    PAST SURGICAL HISTORY: Past Surgical History:  Procedure Laterality Date  . CARDIAC CATHETERIZATION Bilateral 07/14/2016   Procedure: Right/Left Heart Cath and Coronary Angiography;  Surgeon: Isaias Cowman, MD;  Location: [place] [facility];  Service: Cardiovascular;  Laterality: Bilateral;  . CORONARY ANGIOPLASTY    . HERNIA REPAIR    . LEFT HEART CATH AND CORONARY ANGIOGRAPHY N/A 05/09/2019   Procedure: LEFT HEART CATH AND CORONARY ANGIOGRAPHY;  Surgeon: Corey Skains, MD;  Location: [place] [facility];  Service: Cardiovascular;  Laterality: N/A;  . PROSTATE BIOPSY N/A 12/11/2019   Procedure: PROSTATE BIOPSY;  Surgeon: Abbie Sons, MD;  Location: [facility];  Service: Urology;  Laterality: N/A;  . RIGHT HEART CATH N/A 05/17/2019  Procedure: RIGHT HEART CATH;  Surgeon: Larey Dresser, MD;  Location: [place] [facility];  Service: Cardiovascular;  Laterality: N/A;  . RIGHT HEART CATH AND CORONARY ANGIOGRAPHY N/A 05/22/2020   Procedure: RIGHT HEART CATH AND CORONARY ANGIOGRAPHY;  Surgeon: Larey Dresser, MD;  Location: [place] [facility];  Service: Cardiovascular;  Laterality: N/A;  . TEE WITHOUT CARDIOVERSION N/A 05/14/2020   Procedure: TRANSESOPHAGEAL ECHOCARDIOGRAM (TEE);  Surgeon: Larey Dresser, MD;  Location: [facility] ENDOSCOPY;  Service: Cardiovascular;  Laterality: N/A;  . TEE WITHOUT CARDIOVERSION N/A 06/17/2020   Procedure: TRANSESOPHAGEAL ECHOCARDIOGRAM (TEE);  Surgeon: Burnell Blanks, MD;  Location: [place];  Service: Open Heart Surgery;  Laterality: N/A;  . TRANSCATHETER AORTIC VALVE REPLACEMENT, TRANSFEMORAL N/A 06/17/2020   Procedure: TRANSCATHETER AORTIC VALVE REPLACEMENT, TRANSFEMORAL;  Surgeon: Burnell Blanks, MD;  Location: [place];  Service: Open Heart Surgery;  Laterality: N/A;  . TRANSRECTAL ULTRASOUND N/A 12/11/2019   Procedure: TRANSRECTAL ULTRASOUND;  Surgeon: Abbie Sons, MD;  Location: [facility] ORS;  Service: Urology;  Laterality: N/A;    FAMILY HISTORY: Family History  Problem Relation Age of Onset  . Heart attack Father   . Bladder Cancer Neg Hx   . Kidney cancer Neg Hx   . Prostate cancer Neg Hx     ADVANCED DIRECTIVES (Y/N):  N  HEALTH MAINTENANCE: Social History   Tobacco Use  . Smoking status: Former Smoker    Years: 10.00    Types: Cigarettes    Quit date: 1972    Years since quitting: 49.7  . Smokeless tobacco: Former Systems developer    Types: Chew  . Tobacco comment: Quit 1977  Vaping Use  . Vaping Use: Never used  Substance Use Topics  . Alcohol use: No  . Drug use: No     Colonoscopy:  PAP:  Bone density:  Lipid panel:  No Known Allergies  Current Outpatient Medications  Medication Sig Dispense Refill  . albuterol (VENTOLIN HFA) 108 (90 Base) MCG/ACT inhaler Inhale 1-2 puffs into the lungs every 6 (six) hours as needed for wheezing or shortness of breath. 1 each 0  . amiodarone (PACERONE) 200 MG tablet Take 2 tablets (400 mg) by mouth TWICE daily x 2 weeks, then take 1 tablet (200 mg) by mouth TWICE daily x 2 weeks 90 tablet 0  . aspirin 81 MG  chewable tablet Chew 1 tablet (81 mg total) by mouth daily.    Marland Kitchen atorvastatin (LIPITOR) 80 MG tablet Take 80 mg by mouth daily at 6 PM.     . carvedilol (COREG) 6.25 MG tablet TAKE 1 TABLET BY MOUTH 2 TIMES DAILY WITH A MEAL 60 tablet 5  . clopidogrel (PLAVIX) 75 MG tablet Take 1 tablet (75 mg total) by mouth daily. 90 tablet 1  . furosemide (LASIX) 20 MG tablet Take 1 tablet (20 mg total) by mouth as needed (weight. Take one tablet if weight increases by 3 pounds overnight or 5 pounds in one week.). 30 tablet 0  . ipratropium-albuterol (DUONEB) 0.5-2.5 (3) MG/3ML SOLN Take 3 mLs by nebulization every 6 (six) hours as needed.    Marland Kitchen losartan (COZAAR) 25 MG tablet Take 0.5 tablets (12.5 mg total) by mouth daily. 15 tablet 3  . spironolactone (ALDACTONE) 25 MG tablet Take 1 tablet (25 mg total) by mouth at bedtime. 90 tablet 3  . tiotropium (SPIRIVA HANDIHALER) 18 MCG inhalation capsule Place 1 capsule (18 mcg total) into inhaler and inhale daily. 30 capsule 0  .  venlafaxine (EFFEXOR) 75 MG tablet Take 75 mg by mouth as needed.     No current facility-administered medications for this visit.    OBJECTIVE: Vitals:   09/01/20 1100  BP: 136/70  Pulse: 69  Resp: 20  Temp: 97.8 F (36.6 C)  SpO2: 100%     Body mass index is 25.01 kg/m.    ECOG FS:0 - Asymptomatic  General: Well-developed, well-nourished, no acute distress. Eyes: Pink conjunctiva, anicteric sclera. HEENT: Normocephalic, moist mucous membranes. Lungs: No audible wheezing or coughing. Heart: Regular rate and rhythm. Abdomen: Soft, nontender, no obvious distention. Musculoskeletal: No edema, cyanosis, or clubbing. Neuro: Alert, answering all questions appropriately. Cranial nerves grossly intact. Skin: No rashes or petechiae noted. Psych: Normal affect.   LAB RESULTS:  Lab Results  Component Value Date   NA 137 08/28/2020   K 4.2 08/28/2020   CL 103 08/28/2020   CO2 26 08/28/2020   GLUCOSE 111 (H) 08/28/2020   BUN  26 (H) 08/28/2020   CREATININE 1.31 (H) 08/28/2020   CALCIUM 8.7 (L) 08/28/2020   PROT 6.3 08/19/2020   ALBUMIN 3.6 08/19/2020   AST 17 08/19/2020   ALT 11 08/19/2020   ALKPHOS 107 08/19/2020   BILITOT 0.3 08/19/2020   GFRNONAA 49 (L) 08/28/2020   GFRAA 57 (L) 08/28/2020    Lab Results  Component Value Date   WBC 3.4 (L) 08/28/2020   NEUTROABS 2.3 08/28/2020   HGB 11.1 (L) 08/28/2020   HCT 32.8 (L) 08/28/2020   MCV 89.9 08/28/2020   PLT 115 (L) 08/28/2020     STUDIES: CT Abdomen Pelvis W Contrast  Result Date: 08/29/2020 CLINICAL DATA:  Follow-up pancreatic mass and prostate carcinoma. Recently completed radiation therapy. EXAM: CT ABDOMEN AND PELVIS WITH CONTRAST TECHNIQUE: Multidetector CT imaging of the abdomen and pelvis was performed using the standard protocol following bolus administration of intravenous contrast. CONTRAST:  82mL OMNIPAQUE IOHEXOL 300 MG/ML  SOLN COMPARISON:  01/09/2020 FINDINGS: Lower Chest: No acute findings. Hepatobiliary: No hepatic masses identified. Large calcified gallstones again seen, however there is no evidence of cholecystitis or biliary ductal dilatation. Pancreas: A subtle Fernandez-attenuation lesion in the pancreatic head is less conspicuous than on previous study, and also measures slightly smaller in size, currently 1.8 x 1.6 cm on image 28/3 compared to 2.1 x 1.9 cm previously. No other pancreatic lesions identified. No evidence of pancreatic ductal dilatation. Spleen: Within normal limits in size and appearance. Adrenals/Urinary Tract: No masses identified. Several renal cysts remain stable, 1 in the right kidney containing mild mural calcification. No evidence of ureteral calculi or hydronephrosis. Stomach/Bowel: No evidence of obstruction, inflammatory process or abnormal fluid collections. Normal appendix visualized. Incidental note is made of a transient short segment small bowel intussusception in the left abdomen, which is no longer present on  delayed phase. Diverticulosis is seen mainly involving the sigmoid colon, however there is no evidence of diverticulitis. Vascular/Lymphatic: No pathologically enlarged lymph nodes. Previously seen lymphadenopathy in the left external and internal iliac chains has resolved. No other sites of lymphadenopathy identified. Aortic atherosclerosis noted. No abdominal aortic aneurysm. Reproductive:  No mass or other significant abnormality. Other:  None. Musculoskeletal:  No suspicious bone lesions identified. IMPRESSION: Resolution of left pelvic lymphadenopathy since previous study. No new or progressive disease within the abdomen or pelvis. Slight decrease in size and conspicuity of Fernandez-attenuation lesion in pancreatic head. Cholelithiasis. No radiographic evidence of cholecystitis. Colonic diverticulosis. No radiographic evidence of diverticulitis. Electronically Signed   By: Marlaine Hind  M.D.   On: 08/29/2020 10:05    ASSESSMENT: Prostate cancer, pancreatic mass.  PLAN:    1.  Prostate cancer: Pathology revealed multiple cores of Gleason 3+4 and 4+4 adenocarcinoma. PET scan on February 21, 2020 reviewed independently with 2 enlarged hypermetabolic left iliac lymph nodes likely nodal metastasis.  Patient has now completed XRT for local control disease.  PSA was initially greater than 192, this is now trended down to 1.03.  Given patient's advanced age, would not recommend chemotherapy but he may tolerate Zytiga or Xtandi if necessary.  No further interventions are needed.  Proceed with Eligard today.  Return to clinic in 3 months for laboratory work only and then in 6 months with repeat laboratory work, further evaluation, and continuation of treatment.   2.  Pancreatic mass: PET scan results as above with no hypermetabolism in pancreatic lesion.  Follow-up CT scan on August 29, 2020 reviewed independently and reported as above with slight decrease in size of lesion.  Patient CA 19-9 is also now trending down  and 192.  This is likely a benign lesion.  Can consider repeat imaging in 1 year.   3.  Anemia: Hemoglobin is trending up and is now 11.1. 4.  Thrombocytopenia: Chronic and unchanged.  Patient's platelet count is 115. 5.  Renal insufficiency: Patient's creatinine has trended down slightly to 1.31.  Monitor.  Patient expressed understanding and was in agreement with this plan. He also understands that He can call clinic at any time with any questions, concerns, or complaints.   Cancer Staging No matching staging information was found for the patient.  Lloyd Huger, MD   09/01/2020 3:19 PM

## 2020-09-01 ENCOUNTER — Ambulatory Visit
Admission: RE | Admit: 2020-09-01 | Discharge: 2020-09-01 | Disposition: A | Discharge status: Home / Self Care | Payer: Medicare Other | Source: Ambulatory Visit | Attending: Radiation Oncology | Admitting: Radiation Oncology

## 2020-09-01 ENCOUNTER — Encounter: Payer: Medicare Other | Admitting: *Deleted

## 2020-09-01 ENCOUNTER — Other Ambulatory Visit: Payer: Self-pay | Admitting: *Deleted

## 2020-09-01 ENCOUNTER — Inpatient Hospital Stay (HOSPITAL_BASED_OUTPATIENT_CLINIC_OR_DEPARTMENT_OTHER): Payer: Medicare Other | Admitting: Oncology

## 2020-09-01 ENCOUNTER — Other Ambulatory Visit: Payer: Self-pay

## 2020-09-01 ENCOUNTER — Encounter: Payer: Self-pay | Admitting: Oncology

## 2020-09-01 ENCOUNTER — Inpatient Hospital Stay: Payer: Medicare Other

## 2020-09-01 VITALS — BP 136/70 | HR 69 | Temp 97.8°F | Resp 20 | Wt 184.4 lb

## 2020-09-01 DIAGNOSIS — C61 Malignant neoplasm of prostate: Secondary | ICD-10-CM

## 2020-09-01 DIAGNOSIS — Z923 Personal history of irradiation: Secondary | ICD-10-CM | POA: Diagnosis not present

## 2020-09-01 DIAGNOSIS — Z952 Presence of prosthetic heart valve: Secondary | ICD-10-CM | POA: Diagnosis not present

## 2020-09-01 DIAGNOSIS — Z5111 Encounter for antineoplastic chemotherapy: Secondary | ICD-10-CM | POA: Diagnosis not present

## 2020-09-01 MED ORDER — LEUPROLIDE ACETATE (6 MONTH) 45 MG ~~LOC~~ KIT
45.0000 mg | PACK | Freq: Once | SUBCUTANEOUS | Status: AC
  Administered 2020-09-01: 45 mg via SUBCUTANEOUS
  Filled 2020-09-01: qty 45

## 2020-09-01 NOTE — Progress Notes (Signed)
Radiation Oncology Follow up Note  Name: Kristopher Fernandez   Date:   09/01/2020 MRN:  [id number] DOB: 1932-08-02    This 86 y.o. male presents to the clinic today for 48-month follow-up status post IMRT radiation therapy to his prostate and pelvic nodes for stage IIb Gleason 8 (4+4) adenocarcinoma the prostate presenting with a PSA of 892.  REFERRING PROVIDER: Tracie Harrier, MD  HPI: Patient is a 86 year old male now out 4 months having completed IMRT radiation therapy to his prostate and pelvic nodes for stage IIb Gleason 8 adenocarcinoma the prostate presenting with a PSA of 892 seen today in routine follow-up he is doing fairly well specifically denies diarrhea any increased lower urinary tract symptoms..  His most recent PSA was 1.03.  He is currently on ADT therapy through medical oncology.  He specifically denies bone pain.  He recently a CT scan this month showing resolution of left pelvic lymphadenopathy with no evidence of progression of disease in the abdomen or pelvis.  COMPLICATIONS OF TREATMENT: None  FOLLOW UP COMPLIANCE: Excellent  PHYSICAL EXAM:  There were no vitals taken for this visit. Well-developed well-nourished patient in NAD. HEENT reveals PERLA, EOMI, discs not visualized.  Oral cavity is clear. No oral mucosal lesions are identified. Neck is clear without evidence of cervical or supraclavicular adenopathy. Lungs are clear to A&P. Cardiac examination is essentially unremarkable with regular rate and rhythm without murmur rub or thrill. Abdomen is benign with no organomegaly or masses noted. Motor sensory and DTR levels are equal and symmetric in the upper and lower extremities. Cranial nerves II through XII are grossly intact. Proprioception is intact. No peripheral adenopathy or edema is identified. No motor or sensory levels are noted. Crude visual fields are within normal range.  RADIOLOGY RESULTS: CT scan reviewed compatible with above-stated findings  PLAN: Present  time patient is under good biochemical control of his prostate cancer currently on ADT therapy.  Very little soap side effect profile from his IMRT treatment.  I have asked to see the patient back in 6 months for follow-up he continues close follow-up care and ADT therapy through medical oncology.  Patient knows to call with any concerns.  I would like to take this opportunity to thank you for allowing me to participate in the care of your patient.Noreene Filbert, MD

## 2020-09-01 NOTE — Progress Notes (Signed)
Daily Session Note  Patient Details  Name: MOHMED FARVER MRN: [id number] Date of Birth: 11/04/1932 Referring Provider:     Cardiac Rehab from 07/21/2020 in [facility] Cardiac and Pulmonary Rehab  Referring Provider Aundra Dubin      Encounter Date: 09/01/2020  Check In:  Session Check In - 09/01/20 1329      Check-In   Supervising physician immediately available to respond to emergencies See telemetry face sheet for immediately available ER MD    Location [facility]-Cardiac & Pulmonary Rehab    Staff Present Renita Papa, RN Margurite Auerbach, MS Exercise Physiologist;Amanda Oletta Darter, [place], ACSM CEP, Exercise Physiologist    Virtual Visit No    Medication changes reported     No    Fall or balance concerns reported    No    Warm-up and Cool-down Performed on first and last piece of equipment    Resistance Training Performed Yes    VAD Patient? No    PAD/SET Patient? No      Pain Assessment   Currently in Pain? No/denies              Social History   Tobacco Use  Smoking Status Former Smoker  . Years: 10.00  . Types: Cigarettes  . Quit date: 28  . Years since quitting: 49.7  Smokeless Tobacco Former Systems developer  . Types: Chew  Tobacco Comment   Quit 1977    Goals Met:  Independence with exercise equipment Exercise tolerated well No report of cardiac concerns or symptoms Strength training completed today  Goals Unmet:  Not Applicable  Comments: Pt able to follow exercise prescription today without complaint.  Will continue to monitor for progression.    Dr. Emily Filbert is Medical Director for [place] and [facility].

## 2020-09-02 ENCOUNTER — Other Ambulatory Visit (HOSPITAL_COMMUNITY): Payer: Self-pay | Admitting: Cardiology

## 2020-09-03 ENCOUNTER — Encounter: Payer: Medicare Other | Admitting: *Deleted

## 2020-09-03 ENCOUNTER — Other Ambulatory Visit: Payer: Self-pay

## 2020-09-03 DIAGNOSIS — Z952 Presence of prosthetic heart valve: Secondary | ICD-10-CM | POA: Diagnosis not present

## 2020-09-03 LAB — BASIC METABOLIC PANEL
BUN/Creatinine Ratio: 14 (ref 10–24)
BUN: 19 mg/dL (ref 8–27)
CO2: 24 mmol/L (ref 20–29)
Calcium: 9.4 mg/dL (ref 8.6–10.2)
Chloride: 101 mmol/L (ref 96–106)
Creatinine, Ser: 1.35 mg/dL — ABNORMAL HIGH (ref 0.76–1.27)
GFR calc Af Amer: 55 mL/min/{1.73_m2} — ABNORMAL LOW (ref >59)
GFR calc non Af Amer: 47 mL/min/{1.73_m2} — ABNORMAL LOW (ref >59)
Glucose: 117 mg/dL — ABNORMAL HIGH (ref 65–99)
Potassium: 4.9 mmol/L (ref 3.5–5.2)
Sodium: 138 mmol/L (ref 134–144)

## 2020-09-03 NOTE — Progress Notes (Signed)
Daily Session Note  Patient Details  Name: Kristopher Fernandez MRN: [id number] Date of Birth: November 23, 1932 Referring Provider:     Cardiac Rehab from 07/21/2020 in [facility] Cardiac and Pulmonary Rehab  Referring Provider Aundra Dubin      Encounter Date: 09/03/2020  Check In:  Session Check In - 09/03/20 1347      Check-In   Supervising physician immediately available to respond to emergencies See telemetry face sheet for immediately available ER MD    Location [facility]-Cardiac & Pulmonary Rehab    Staff Present Renita Papa, RN BSN;Joseph Lou Miner, [place] Exercise Physiologist    Virtual Visit No    Medication changes reported     No    Fall or balance concerns reported    No    Warm-up and Cool-down Performed on first and last piece of equipment    Resistance Training Performed Yes    VAD Patient? No    PAD/SET Patient? No      Pain Assessment   Currently in Pain? No/denies              Social History   Tobacco Use  Smoking Status Former Smoker  . Years: 10.00  . Types: Cigarettes  . Quit date: 48  . Years since quitting: 49.7  Smokeless Tobacco Former Systems developer  . Types: Chew  Tobacco Comment   Quit 1977    Goals Met:  Independence with exercise equipment Exercise tolerated well No report of cardiac concerns or symptoms Strength training completed today  Goals Unmet:  Not Applicable  Comments: Pt able to follow exercise prescription today without complaint.  Will continue to monitor for progression.    Dr. Emily Filbert is Medical Director for [place] and [facility].

## 2020-09-04 ENCOUNTER — Encounter: Payer: Medicare Other | Admitting: *Deleted

## 2020-09-04 ENCOUNTER — Other Ambulatory Visit: Payer: Self-pay

## 2020-09-04 DIAGNOSIS — Z952 Presence of prosthetic heart valve: Secondary | ICD-10-CM | POA: Diagnosis not present

## 2020-09-04 NOTE — Progress Notes (Signed)
Daily Session Note  Patient Details  Name: Kristopher Fernandez MRN: [id number] Date of Birth: October 19, 1932 Referring Provider:     Cardiac Rehab from 07/21/2020 in [facility] Cardiac and Pulmonary Rehab  Referring Provider Aundra Dubin      Encounter Date: 09/04/2020  Check In:  Session Check In - 09/04/20 1355      Check-In   Supervising physician immediately available to respond to emergencies See telemetry face sheet for immediately available ER MD    Location [facility]-Cardiac & Pulmonary Rehab    Staff Present Renita Papa, RN BSN;Joseph [address] [place], [place], [place], CCRP, CCET    Virtual Visit No    Medication changes reported     No    Fall or balance concerns reported    No    Warm-up and Cool-down Performed on first and last piece of equipment    Resistance Training Performed Yes    VAD Patient? No    PAD/SET Patient? No      Pain Assessment   Currently in Pain? No/denies              Social History   Tobacco Use  Smoking Status Former Smoker  . Years: 10.00  . Types: Cigarettes  . Quit date: 66  . Years since quitting: 49.7  Smokeless Tobacco Former Systems developer  . Types: Chew  Tobacco Comment   Quit 1977    Goals Met:  Independence with exercise equipment Exercise tolerated well No report of cardiac concerns or symptoms Strength training completed today  Goals Unmet:  Not Applicable  Comments: Pt able to follow exercise prescription today without complaint.  Will continue to monitor for progression.    Dr. Emily Filbert is Medical Director for [place] and [facility].

## 2020-09-08 ENCOUNTER — Telehealth: Payer: Self-pay | Admitting: Internal Medicine

## 2020-09-08 ENCOUNTER — Encounter: Payer: Medicare Other | Admitting: *Deleted

## 2020-09-08 ENCOUNTER — Other Ambulatory Visit: Payer: Self-pay

## 2020-09-08 DIAGNOSIS — Z952 Presence of prosthetic heart valve: Secondary | ICD-10-CM | POA: Diagnosis not present

## 2020-09-08 NOTE — Progress Notes (Signed)
Daily Session Note  Patient Details  Name: Kristopher Fernandez MRN: [id number] Date of Birth: 10/23/1932 Referring Provider:     Cardiac Rehab from 07/21/2020 in [facility] Cardiac and Pulmonary Rehab  Referring Provider Aundra Dubin      Encounter Date: 09/08/2020  Check In:  Session Check In - 09/08/20 1335      Check-In   Supervising physician immediately available to respond to emergencies See telemetry face sheet for immediately available ER MD    Location [facility]-Cardiac & Pulmonary Rehab    Staff Present Earlean Shawl, BS, ACSM CEP, Exercise Physiologist;Marquel Pottenger Sherryll Burger, RN Margurite Auerbach, MS Exercise Physiologist    Virtual Visit No    Medication changes reported     No    Fall or balance concerns reported    No    Warm-up and Cool-down Performed on first and last piece of equipment    Resistance Training Performed Yes    VAD Patient? No    PAD/SET Patient? No      Pain Assessment   Currently in Pain? No/denies              Social History   Tobacco Use  Smoking Status Former Smoker  . Years: 10.00  . Types: Cigarettes  . Quit date: 3  . Years since quitting: 49.7  Smokeless Tobacco Former Systems developer  . Types: Chew  Tobacco Comment   Quit 1977    Goals Met:  Independence with exercise equipment Exercise tolerated well No report of cardiac concerns or symptoms Strength training completed today  Goals Unmet:  Not Applicable  Comments: Pt able to follow exercise prescription today without complaint.  Will continue to monitor for progression.    Dr. Emily Filbert is Medical Director for [place] and [facility].

## 2020-09-09 NOTE — Telephone Encounter (Signed)
Pharmacist calling  States that there is a possible interaction between newly prescribed amiodarone and atorvastatin  Needed to make sure Dr Caryl Comes was aware that patient was on a high dose of atorvastatin  Please call Izora Gala to discuss at [phone] - amiodarone prescription has been placed on hold until they are able to discuss and clarify

## 2020-09-09 NOTE — Telephone Encounter (Signed)
To Dr. Klein to review. 

## 2020-09-10 ENCOUNTER — Encounter: Payer: Medicare Other | Admitting: *Deleted

## 2020-09-10 ENCOUNTER — Other Ambulatory Visit: Payer: Self-pay

## 2020-09-10 DIAGNOSIS — Z952 Presence of prosthetic heart valve: Secondary | ICD-10-CM | POA: Diagnosis not present

## 2020-09-10 NOTE — Telephone Encounter (Signed)
Attempted to call the patient at his home # - busy signal x 2 tries.  Attempted to call the patient at his cell # No answer- his voice mail box is full.

## 2020-09-10 NOTE — Progress Notes (Signed)
Daily Session Note  Patient Details  Name: Kristopher Fernandez MRN: [id number] Date of Birth: [birth year] Referring Provider:     Cardiac Rehab from 07/21/2020 in [facility] Cardiac and Pulmonary Rehab  Referring Provider Aundra Dubin      Encounter Date: 09/10/2020  Check In:  Session Check In - 09/10/20 1346      Check-In   Supervising physician immediately available to respond to emergencies See telemetry face sheet for immediately available ER MD    Location [facility]-Cardiac & Pulmonary Rehab    Staff Present Renita Papa, RN Margurite Auerbach, MS Exercise Physiologist;Joseph Tessie Fass RCP,RRT,BSRT    Virtual Visit No    Medication changes reported     No    Fall or balance concerns reported    No    Warm-up and Cool-down Performed on first and last piece of equipment    Resistance Training Performed Yes    VAD Patient? No    PAD/SET Patient? No      Pain Assessment   Currently in Pain? No/denies              Social History   Tobacco Use  Smoking Status Former Smoker  . Years: 10.00  . Types: Cigarettes  . Quit date: 45  . Years since quitting: 49.7  Smokeless Tobacco Former Systems developer  . Types: Chew  Tobacco Comment   Quit 1977    Goals Met:  Independence with exercise equipment Exercise tolerated well No report of cardiac concerns or symptoms Strength training completed today  Goals Unmet:  Not Applicable  Comments: Pt able to follow exercise prescription today without complaint.  Will continue to monitor for progression.    Dr. Emily Filbert is Medical Director for [place] and [facility].

## 2020-09-10 NOTE — Telephone Encounter (Signed)
H  dont see anything specific,-- nothing mentioned at all in uptoDate, but micromedex suggest reasonable data-- prob related to that both amio and simva are metabolized by the same liver system  His last LDL was 49 so why dont we decrease atorva to 20 mg daily and we can recheck his lipids when he comes back to see either me or DM Thanks SK

## 2020-09-11 ENCOUNTER — Encounter: Payer: Medicare Other | Admitting: *Deleted

## 2020-09-11 ENCOUNTER — Other Ambulatory Visit: Payer: Self-pay

## 2020-09-11 DIAGNOSIS — Z952 Presence of prosthetic heart valve: Secondary | ICD-10-CM

## 2020-09-11 NOTE — Progress Notes (Signed)
Daily Session Note  Patient Details  Name: CONSUELO THAYNE MRN: [id number] Date of Birth: 1932-05-10 Referring Provider:     Cardiac Rehab from 07/21/2020 in [facility] Cardiac and Pulmonary Rehab  Referring Provider Aundra Dubin      Encounter Date: 09/11/2020  Check In:  Session Check In - 09/11/20 1347      Check-In   Supervising physician immediately available to respond to emergencies See telemetry face sheet for immediately available ER MD    Location [facility]-Cardiac & Pulmonary Rehab    Staff Present Renita Papa, RN BSN;Jessica Luan Pulling, MA, RCEP, CCRP, CCET;Joseph Atoka RCP,RRT,BSRT    Virtual Visit No    Medication changes reported     No    Fall or balance concerns reported    No    Warm-up and Cool-down Performed on first and last piece of equipment    Resistance Training Performed Yes    VAD Patient? No    PAD/SET Patient? No      Pain Assessment   Currently in Pain? No/denies              Social History   Tobacco Use  Smoking Status Former Smoker  . Years: 10.00  . Types: Cigarettes  . Quit date: 51  . Years since quitting: 49.7  Smokeless Tobacco Former Systems developer  . Types: Chew  Tobacco Comment   Quit 1977    Goals Met:  Independence with exercise equipment Exercise tolerated well No report of cardiac concerns or symptoms Strength training completed today  Goals Unmet:  Not Applicable  Comments: Pt able to follow exercise prescription today without complaint.  Will continue to monitor for progression.    Dr. Emily Filbert is Medical Director for [place] and [facility].

## 2020-09-15 ENCOUNTER — Other Ambulatory Visit: Payer: Self-pay

## 2020-09-15 ENCOUNTER — Encounter: Payer: Medicare Other | Admitting: *Deleted

## 2020-09-15 DIAGNOSIS — Z952 Presence of prosthetic heart valve: Secondary | ICD-10-CM | POA: Diagnosis not present

## 2020-09-15 MED ORDER — ATORVASTATIN CALCIUM 20 MG PO TABS: 20.0000 mg | ORAL_TABLET | Freq: Every day | ORAL | 3 refills | Status: AC

## 2020-09-15 NOTE — Telephone Encounter (Signed)
I spoke with the patient. I advised him Dr. Caryl Comes had received a fax from the pharmacy stating their was a contraindication between high dose atorvastatin and amiodarone.   He is aware this is most likely due to the fact that they are both metabolized through the liver.  I have advised him that Dr. Caryl Comes recommends decreasing his atorvastatin to 20 mg once daily with a repeat lipid/ liver at his next follow up appointment.  He is due to see Dr. Caryl Comes on 11/11/20. I have advised him to come fasting to the appointment.   The patient is finishing up the 2nd phase of his loading dose of amiodarone at 200 mg BID and will decrease this to 200 mg once daily next week.  The patient voices understanding of the above recommendations and is agreeable.

## 2020-09-15 NOTE — Progress Notes (Signed)
Daily Session Note  Patient Details  Name: ISAISH ALEMU MRN: [id number] Date of Birth: 28-Oct-1932 Referring Provider:     Cardiac Rehab from 07/21/2020 in [facility] Cardiac and Pulmonary Rehab  Referring Provider Aundra Dubin      Encounter Date: 09/15/2020  Check In:  Session Check In - 09/15/20 1329      Check-In   Supervising physician immediately available to respond to emergencies See telemetry face sheet for immediately available ER MD    Location [facility]Cardiac & Pul[facility] Re[facility]    Staff Present Renita Papa, RN Margurite Auerbach, MS Exercise Physiologist;Kelly Amedeo Plenty, BS, ACSM CEP, Exercise Physiologist    Virtual Visit No    Medication changes reported     No    Fall or balance concerns reported    No    Warm-up and Cool-down Performed on first and last piece of equipment    Resistance Training Performed Yes    VAD Patient? No    PAD/SET Patient? No      Pain Assessment   Currently in Pain? No/denies              Social History   Tobacco Use  Smoking Status Former Smoker  . Years: 10.00  . Types: Cigarettes  . Quit date: 38  . Years since quitting: 49.7  Smokeless Tobacco Former Systems developer  . Types: Chew  Tobacco Comment   Quit 1977    Goals Met:  Independence with exercise equipment Exercise tolerated well No report of cardiac concerns or symptoms Strength training completed today  Goals Unmet:  Not Applicable  Comments: Pt able to follow exercise prescription today without complaint.  Will continue to monitor for progression.    Dr. Emily Filbert is Medical Director for [place] and [facility].

## 2020-09-15 NOTE — Addendum Note (Signed)
Addended by: Alvis Lemmings C on: 09/15/2020 04:14 PM   Modules accepted: Orders

## 2020-09-17 ENCOUNTER — Encounter: Payer: Medicare Other | Admitting: *Deleted

## 2020-09-17 ENCOUNTER — Other Ambulatory Visit: Payer: Self-pay

## 2020-09-17 DIAGNOSIS — Z952 Presence of prosthetic heart valve: Secondary | ICD-10-CM | POA: Diagnosis not present

## 2020-09-17 NOTE — Progress Notes (Signed)
Daily Session Note  Patient Details  Name: BELFORD PASCUCCI MRN: [id number] Date of Birth: January 07, 1932 Referring Provider:     Cardiac Rehab from 07/21/2020 in [facility] Cardiac and Pul[facility] Rehab  Referring Provider Aundra Dubin      Encounter Date: 09/17/2020  Check In:  Session Check In - 09/17/20 1423      Check-In   Supervising physician immediately available to respond to emergencies See telemetry face sheet for immediately available ER MD    Location [facility]Card[facility] Re[facility]    Staff Present Renita Papa, RN Margurite Auerbach, MS Exercise Physiologist;Joseph Foy [place], [place], ACSM CEP, Exercise Physiologist    Virtual Visit No    Medication changes reported     No    Fall or balance concerns reported    No    Warm-up and Cool-down Performed on first and last piece of equipment    Resistance Training Performed Yes    VAD Patient? No    PAD/SET Patient? No      Pain Assessment   Currently in Pain? No/denies              Social History   Tobacco Use  Smoking Status Former Smoker  . Years: 10.00  . Types: Cigarettes  . Quit date: 49  . Years since quitting: 49.7  Smokeless Tobacco Former Systems developer  . Types: Chew  Tobacco Comment   Quit 1977    Goals Met:  Independence with exercise equipment Exercise tolerated well No report of cardiac concerns or symptoms Strength training completed today  Goals Unmet:  Not Applicable  Comments: Pt able to follow exercise prescription today without complaint.  Will continue to monitor for progression.    Dr. Emily Filbert is Medical Director for [place] and [facility].

## 2020-09-18 ENCOUNTER — Other Ambulatory Visit: Payer: Self-pay

## 2020-09-18 ENCOUNTER — Encounter: Payer: Medicare Other | Admitting: *Deleted

## 2020-09-18 DIAGNOSIS — Z952 Presence of prosthetic heart valve: Secondary | ICD-10-CM | POA: Diagnosis not present

## 2020-09-18 NOTE — Progress Notes (Signed)
Daily Session Note  Patient Details  Name: Kristopher Fernandez MRN: [id number] Date of Birth: 1932/08/16 Referring Provider:     Cardiac Rehab from 07/21/2020 in [facility] Cardiac and Pulmonary Rehab  Referring Provider Aundra Dubin      Encounter Date: 09/18/2020  Check In:  Session Check In - 09/18/20 1342      Check-In   Supervising physician immediately available to respond to emergencies See telemetry face sheet for immediately available ER MD    Location [facility] & Pul[facility] Re[facility]    Staff Present Renita Papa, RN BSN;Melissa Caiola RDN, LDN;Joseph Tessie Fass RCP,RRT,BSRT    Virtual Visit No    Medication changes reported     No    Fall or balance concerns reported    No    Warm-up and Cool-down Performed on first and last piece of equipment    Resistance Training Performed Yes    VAD Patient? No    PAD/SET Patient? No      Pain Assessment   Currently in Pain? No/denies              Social History   Tobacco Use  Smoking Status Former Smoker  . Years: 10.00  . Types: Cigarettes  . Quit date: 56  . Years since quitting: 49.7  Smokeless Tobacco Former Systems developer  . Types: Chew  Tobacco Comment   Quit 1977    Goals Met:  Independence with exercise equipment Exercise tolerated well No report of cardiac concerns or symptoms Strength training completed today  Goals Unmet:  Not Applicable  Comments: Pt able to follow exercise prescription today without complaint.  Will continue to monitor for progression.    Dr. Emily Filbert is Medical Director for [place] and [facility].

## 2020-09-22 ENCOUNTER — Encounter: Payer: Medicare Other | Attending: Internal Medicine | Admitting: *Deleted

## 2020-09-22 ENCOUNTER — Other Ambulatory Visit: Payer: Self-pay

## 2020-09-22 DIAGNOSIS — Z952 Presence of prosthetic heart valve: Secondary | ICD-10-CM | POA: Diagnosis present

## 2020-09-22 NOTE — Progress Notes (Signed)
Daily Session Note  Patient Details  Name: Kristopher Fernandez MRN: [id number] Date of Birth: 1932/06/22 Referring Provider:     Cardiac Rehab from 07/21/2020 in [facility] Cardiac and Pulmonary Rehab  Referring Provider Aundra Dubin      Encounter Date: 09/22/2020  Check In:  Session Check In - 09/22/20 1339      Check-In   Supervising physician immediately available to respond to emergencies See telemetry face sheet for immediately available ER MD    Location [facility]-Cardiac & Pulmonary Rehab    Staff Present Renita Papa, RN Margurite Auerbach, MS Exercise Physiologist;Kelly Amedeo Plenty, BS, ACSM CEP, Exercise Physiologist    Virtual Visit No    Medication changes reported     No    Fall or balance concerns reported    No    Warm-up and Cool-down Performed on first and last piece of equipment    Resistance Training Performed Yes    VAD Patient? No    PAD/SET Patient? No      Pain Assessment   Currently in Pain? No/denies              Social History   Tobacco Use  Smoking Status Former Smoker  . Years: 10.00  . Types: Cigarettes  . Quit date: 65  . Years since quitting: 49.7  Smokeless Tobacco Former Systems developer  . Types: Chew  Tobacco Comment   Quit 1977    Goals Met:  Independence with exercise equipment Exercise tolerated well No report of cardiac concerns or symptoms Strength training completed today  Goals Unmet:  Not Applicable  Comments: Pt able to follow exercise prescription today without complaint.  Will continue to monitor for progression.    Dr. Emily Filbert is Medical Director for [place] and [facility].

## 2020-09-24 ENCOUNTER — Encounter: Payer: Medicare Other | Admitting: *Deleted

## 2020-09-24 ENCOUNTER — Encounter: Payer: Self-pay | Admitting: *Deleted

## 2020-09-24 ENCOUNTER — Other Ambulatory Visit: Payer: Self-pay

## 2020-09-24 DIAGNOSIS — Z952 Presence of prosthetic heart valve: Secondary | ICD-10-CM

## 2020-09-24 NOTE — Progress Notes (Signed)
Cardiac Individual Treatment Plan  Patient Details  Name: Kristopher Fernandez MRN: [id number] Date of Birth: 1932-02-05 Referring Provider:     Cardiac Rehab from 07/21/2020 in [facility] Cardiac and Pulmonary Rehab  Referring Provider Aundra Dubin      Initial Encounter Date:    Cardiac Rehab from 07/21/2020 in [facility] Cardiac and Pulmonary Rehab  Date 07/21/20      Visit Diagnosis: S/P TAVR (transcatheter aortic valve replacement)  Patient's Home Medications on Admission:  Current Outpatient Medications:  .  albuterol (VENTOLIN HFA) 108 (90 Base) MCG/ACT inhaler, Inhale 1-2 puffs into the lungs every 6 (six) hours as needed for wheezing or shortness of breath., Disp: 1 each, Rfl: 0 .  amiodarone (PACERONE) 200 MG tablet, TAKE 2 TABLETS BY MOUTH TWICE A DAY FOR 2 WEEKS, THEN TAKE 1 TABLET BY MOUTH TWICE DAILY FOR 2 WEEKS, Disp: 90 tablet, Rfl: 0 .  aspirin 81 MG chewable tablet, Chew 1 tablet (81 mg total) by mouth daily., Disp: , Rfl:  .  atorvastatin (LIPITOR) 20 MG tablet, Take 1 tablet (20 mg total) by mouth daily., Disp: 90 tablet, Rfl: 3 .  carvedilol (COREG) 6.25 MG tablet, TAKE 1 TABLET BY MOUTH 2 TIMES DAILY WITH A MEAL, Disp: 60 tablet, Rfl: 5 .  clopidogrel (PLAVIX) 75 MG tablet, Take 1 tablet (75 mg total) by mouth daily., Disp: 90 tablet, Rfl: 1 .  furosemide (LASIX) 20 MG tablet, Take 1 tablet (20 mg total) by mouth as needed (weight. Take one tablet if weight increases by 3 pounds overnight or 5 pounds in one week.)., Disp: 30 tablet, Rfl: 0 .  ipratropium-albuterol (DUONEB) 0.5-2.5 (3) MG/3ML SOLN, Take 3 mLs by nebulization every 6 (six) hours as needed., Disp: , Rfl:  .  losartan (COZAAR) 25 MG tablet, Take 0.5 tablets (12.5 mg total) by mouth daily., Disp: 15 tablet, Rfl: 3 .  spironolactone (ALDACTONE) 25 MG tablet, TAKE 1 TABLET BY MOUTH EVERYDAY AT BEDTIME, Disp: 90 tablet, Rfl: 3 .  tiotropium (SPIRIVA HANDIHALER) 18 MCG inhalation capsule, Place 1 capsule (18 mcg total) into inhaler and  inhale daily., Disp: 30 capsule, Rfl: 0 .  venlafaxine (EFFEXOR) 75 MG tablet, Take 75 mg by mouth as needed., Disp: , Rfl:   Past Medical History: Past Medical History:  Diagnosis Date  . Anginal pain ([place])   . Aortic stenosis   . Aortic stenosis   . Basal cell carcinoma, ear 03/31/2015  . Bradycardia 07/03/2014  . CHF (congestive heart failure) ([place])   . DDD (degenerative disc disease), lumbar 08/06/2015  . H/O cardiac catheterization 07/03/2014   Overview:  Cypher stent proximal and distal RCA 07/18/05 and TAXUS stent mid LAD 07/20/05 at [facility]  . HTN (hypertension) 07/03/2014  . Hyperlipidemia 07/03/2014  . Ischemic cardiomyopathy   . Lumbosacral radiculopathy at S1 08/22/2015  . MI (myocardial infarction) ([place]) 07/03/2014   Overview:  Mi 07/17/05  . Myocardial infarction ([place])   . Normocytic normochromic anemia 09/14/2015  . Pacemaker 07/03/2014   Overview:  Dual chamber pacemaker 08/11/11  . Pancreatic mass    not hypermetabolic on PET-CT imaging  . Prostate cancer ([place])    metastatic  . 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 Shortness of breath dyspnea   . SOB (shortness of breath) on exertion 05/13/2016    Tobacco Use: Social History   Tobacco Use  Smoking Status Former Smoker  . Years: 10.00  . Types: Cigarettes  . Quit date: 64  . Years since quitting: 49.7  Smokeless Tobacco Former Systems developer  . Types: Chew  Tobacco Comment   Quit 1977    Labs: Recent Review Flowsheet Data    Labs for ITP Cardiac and Pulmonary Rehab Latest Ref Rng & Units 05/22/2020 06/13/2020 06/17/2020 06/17/2020 06/17/2020   Cholestrol 0 - 200 mg/dL - - - - -   LDLCALC 0 - 99 mg/dL - - - - -   HDL >40 mg/dL - - - - -   Trlycerides <150 mg/dL - - - - -   Hemoglobin A1c 4.8 - 5.6 % - 5.6 - - -   PHART 7.35 - 7.45 - 7.425 7.324(L) - -   PCO2ART 32 - 48 mmHg - 37.7 53.1(H) - -   HCO3 20.0 -  28.0 mmol/L 24.7 24.3 27.6 - -   TCO2 22 - 32 mmol/L 26 - _0 ACIDBASEDEF 0.0 - 2.0 mmol/L 1.0 - - - -   O2SAT % 64.0 94.3 99.0 - -       Exercise Target Goals: Exercise Program Goal: Individual exercise prescription set using results from initial 6 min walk test and THRR while considering  patient's activity barriers and safety.   Exercise Prescription Goal: Initial exercise prescription builds to 30-45 minutes a day of aerobic activity, 2-3 days per week.  Home exercise guidelines will be given to patient during program as part of exercise prescription that the participant will acknowledge.   Education: Aerobic Exercise & Resistance Training: - Gives group verbal and written instruction on the various components of exercise. Focuses on aerobic and resistive training programs and the benefits of this training and how to safely progress through these programs..   Education: Exercise & Equipment Safety: - Individual verbal instruction and demonstration of equipment use and safety with use of the equipment.   Cardiac Rehab from 07/21/2020 in [facility] Cardiac and Pulmonary Rehab  Date 07/21/20  Educator AS  Instruction Review Code 1- Verbalizes Understanding      Education: Exercise Physiology & General Exercise Guidelines: - Group verbal and written instruction with models to review the exercise physiology of the cardiovascular system and associated critical values. Provides general exercise guidelines with specific guidelines to those with heart or lung disease.    Education: Flexibility, Balance, Mind/Body Relaxation: Provides group verbal/written instruction on the benefits of flexibility and balance training, including mind/body exercise modes such as yoga, pilates and tai chi.  Demonstration and skill practice provided.   Activity Barriers & Risk Stratification:  Activity Barriers & Cardiac Risk Stratification - 07/17/20 1506      Activity Barriers & Cardiac Risk  Stratification   Activity Barriers Shortness of Breath;Deconditioning   Uses a cane   Cardiac Risk Stratification Low           6 Minute Walk:  6 Minute Walk    Row Name 07/21/20 1505         6 Minute Walk   Phase Initial     Distance 655 feet     Walk Time 6 minutes     # of Rest Breaks 0     MPH 1.24     METS 1     RPE 13     Perceived Dyspnea  2     VO2 Peak 3.5     Symptoms No     Resting HR 77 bpm  Resting BP 112/66     Resting Oxygen Saturation  94 %     Exercise Oxygen Saturation  during 6 min walk 88 %     Max Ex. HR 91 bpm     Max Ex. BP 138/66     2 Minute Post BP 118/68            Oxygen Initial Assessment:   Oxygen Re-Evaluation:   Oxygen Discharge (Final Oxygen Re-Evaluation):   Initial Exercise Prescription:  Initial Exercise Prescription - 07/21/20 1500      Date of Initial Exercise RX and Referring Provider   Date 07/21/20    Referring Provider Aundra Dubin      Treadmill   MPH 1    Grade 0    Minutes 15    METs 1.77      Recumbant Bike   Level 1    RPM 60    Minutes 15    METs 1      NuStep   Level 1    SPM 80    Minutes 15    METs 1      Prescription Details   Frequency (times per week) 3    Duration Progress to 30 minutes of continuous aerobic without signs/symptoms of physical distress      Intensity   THRR 40-80% of Max Heartrate 100-123    Ratings of Perceived Exertion 11-13    Perceived Dyspnea 0-4      Resistance Training   Training Prescription Yes    Weight 3 lb    Reps 10-15           Perform Capillary Blood Glucose checks as needed.  Exercise Prescription Changes:  Exercise Prescription Changes    Row Name 07/21/20 1500 07/30/20 1400 08/13/20 1400 08/26/20 1400 09/11/20 0800     Response to Exercise   Blood Pressure (Admit) 112/66 108/66 108/64 126/62 126/58   Blood Pressure (Exercise) 138/66 128/70 126/74 124/64 130/66   Blood Pressure (Exit) 118/68 122/74 114/64 134/60 120/60   Heart Rate  (Admit) 77 bpm 59 bpm 77 bpm 71 bpm 79 bpm   Heart Rate (Exercise) 91 bpm 95 bpm 98 bpm 98 bpm 96 bpm   Heart Rate (Exit) 82 bpm 103 bpm 94 bpm 93 bpm 83 bpm   Oxygen Saturation (Admit) 94 % -- -- -- --   Oxygen Saturation (Exercise) 88 % -- -- -- --   Oxygen Saturation (Exit) 94 % -- -- -- --   Rating of Perceived Exertion (Exercise) _0 Perceived Dyspnea (Exercise) 2 -- -- -- --   Symptoms _1    Duration -- Continue with 30 min of aerobic exercise without signs/symptoms of physical distress. Continue with 30 min of aerobic exercise without signs/symptoms of physical distress. Continue with 30 min of aerobic exercise without signs/symptoms of physical distress. Continue with 30 min of aerobic exercise without signs/symptoms of physical distress.   Intensity -- THRR unchanged THRR unchanged THRR unchanged THRR unchanged     Progression   Progression -- Continue to progress workloads to maintain intensity without signs/symptoms of physical distress. Continue to progress workloads to maintain intensity without signs/symptoms of physical distress. Continue to progress workloads to maintain intensity without signs/symptoms of physical distress. Continue to progress workloads to maintain intensity without signs/symptoms of physical distress.   Average METs -- 1.65 2.37 2.5 2.43     Resistance Training   Training Prescription -- Yes Yes Yes  Yes   Weight -- 3 lb 4 lb 4 lb 4 lb   Reps -- 10-15 10-15 10-15 10-15     Interval Training   Interval Training -- -- No No No     Treadmill   MPH -- 1.9 1.9 -- 1.2   Grade -- 0 0 -- 4.5   Minutes -- 15 15 -- 15   METs -- 2.45 2.45 -- 2.66     Recumbant Bike   Level -- -- 1._0 RPM -- -- -- 60 --   Minutes -- -- _1 METs -- -- -- 2.6 --     NuStep   Level -- _2 SPM -- 80 -- 80 --   Minutes -- _3 METs -- 1.7 2.3 2.4 2.2     Home Exercise Plan   Plans to continue exercise at -- --  Home (comment)  walking, leg PT exercises Home (comment)  walking, leg PT exercises Home (comment)  walking, leg PT exercises   Frequency -- -- Add 3 additional days to program exercise sessions. Add 3 additional days to program exercise sessions. Add 3 additional days to program exercise sessions.   Initial Home Exercises Provided -- -- 08/07/20 08/07/20 08/07/20   Row Name 09/23/20 1300             Response to Exercise   Blood Pressure (Admit) 106/58       Blood Pressure (Exercise) 120/58       Blood Pressure (Exit) 108/56       Heart Rate (Admit) 77 bpm       Heart Rate (Exercise) 97 bpm       Heart Rate (Exit) 78 bpm       Rating of Perceived Exertion (Exercise) 14       Symptoms none       Duration Continue with 30 min of aerobic exercise without signs/symptoms of physical distress.       Intensity THRR unchanged         Progression   Progression Continue to progress workloads to maintain intensity without signs/symptoms of physical distress.       Average METs 2.2         Resistance Training   Training Prescription Yes       Weight 4 lb       Reps 10-15         Interval Training   Interval Training No         Treadmill   MPH 1.3       Grade 0       Minutes 15       METs 2         NuStep   Level 3       SPM 80       Minutes 15       METs 2.3         Home Exercise Plan   Plans to continue exercise at Home (comment)  walking, leg PT exercises       Frequency Add 3 additional days to program exercise sessions.       Initial Home Exercises Provided 08/07/20              Exercise Comments:   Exercise Goals and Review:  Exercise Goals    Row Name 07/21/20 [phone]  Exercise Goals   Increase Physical Activity Yes       Intervention Provide advice, education, support and counseling about physical activity/exercise needs.;Develop an individualized exercise prescription for aerobic and resistive training based on initial evaluation findings, risk  stratification, comorbidities and participant's personal goals.       Expected Outcomes Short Term: Attend rehab on a regular basis to increase amount of physical activity.;Long Term: Add in home exercise to make exercise part of routine and to increase amount of physical activity.;Long Term: Exercising regularly at least 3-5 days a week.       Increase Strength and Stamina Yes       Intervention Provide advice, education, support and counseling about physical activity/exercise needs.;Develop an individualized exercise prescription for aerobic and resistive training based on initial evaluation findings, risk stratification, comorbidities and participant's personal goals.       Expected Outcomes Short Term: Increase workloads from initial exercise prescription for resistance, speed, and METs.;Short Term: Perform resistance training exercises routinely during rehab and add in resistance training at home;Long Term: Improve cardiorespiratory fitness, muscular endurance and strength as measured by increased METs and functional capacity (6MWT)       Able to understand and use rate of perceived exertion (RPE) scale Yes       Intervention Provide education and explanation on how to use RPE scale       Expected Outcomes Short Term: Able to use RPE daily in rehab to express subjective intensity level;Long Term:  Able to use RPE to guide intensity level when exercising independently       Able to understand and use Dyspnea scale Yes       Intervention Provide education and explanation on how to use Dyspnea scale       Expected Outcomes Short Term: Able to use Dyspnea scale daily in rehab to express subjective sense of shortness of breath during exertion;Long Term: Able to use Dyspnea scale to guide intensity level when exercising independently       Knowledge and understanding of Target Heart Rate Range (THRR) Yes       Intervention Provide education and explanation of THRR including how the numbers were predicted  and where they are located for reference       Expected Outcomes Short Term: Able to state/look up THRR;Short Term: Able to use daily as guideline for intensity in rehab;Long Term: Able to use THRR to govern intensity when exercising independently       Able to check pulse independently Yes       Intervention Provide education and demonstration on how to check pulse in carotid and radial arteries.;Review the importance of being able to check your own pulse for safety during independent exercise       Expected Outcomes Short Term: Able to explain why pulse checking is important during independent exercise;Long Term: Able to check pulse independently and accurately       Understanding of Exercise Prescription Yes       Intervention Provide education, explanation, and written materials on patient's individual exercise prescription       Expected Outcomes Short Term: Able to explain program exercise prescription;Long Term: Able to explain home exercise prescription to exercise independently              Exercise Goals Re-Evaluation :  Exercise Goals Re-Evaluation    Row Name 07/23/20 1341 07/30/20 1438 08/07/20 1347 08/13/20 1440 08/26/20 1500     Exercise Goal Re-Evaluation   Exercise Goals Review  Increase Physical Activity;Able to understand and use rate of perceived exertion (RPE) scale;Knowledge and understanding of Target Heart Rate Range (THRR);Understanding of Exercise Prescription;Increase Strength and Stamina;Able to check pulse independently Increase Physical Activity;Able to understand and use rate of perceived exertion (RPE) scale;Knowledge and understanding of Target Heart Rate Range (THRR);Understanding of Exercise Prescription;Increase Strength and Stamina;Able to check pulse independently Increase Physical Activity;Increase Strength and Stamina;Understanding of Exercise Prescription Increase Physical Activity;Increase Strength and Stamina;Understanding of Exercise Prescription Increase  Physical Activity;Increase Strength and Stamina;Understanding of Exercise Prescription   Comments Reviewed RPE and dyspnea scales, THR and program prescription with pt today.  Pt voiced understanding and was given a copy of goals to take home. Mosi has tolerated exercise well in his first week of attendance. Staff will monitor progress. Kratos is doing well in rehab.  He has already started to notice an increase in his lower body strength.  He is following his routine for his legs strength.  His biggest limitation continues to be his breathing. He is using his PLB to help.  Reviewed home exercise with pt today.  Pt plans to walk and continue with leg exercises at home for exercise.  Reviewed THR, pulse, RPE, sign and symptoms, pulse oximetery and when to call 911 or MD.  Also discussed weather considerations and indoor options.  Pt voiced understanding. Naren continues to do well in rehab.  He is up to 4 lb handweights.  We will continue to monitor his progress. Ashby is progressing well and is up to level 3 on NS.  He attends consistently and works at correct RPE.   Expected Outcomes Short: Use RPE daily to regulate intensity. Long: Follow program prescription in THR. Short:  attend consistently Long: improve overall stamina Short: Start to add in walking at home Long: Continue to improve stamina. Short: Continue to attend consistently Long: Continue to walk at home on off days. Short: Continue to attend consistently Long: increase overall MET level   Row Name 09/08/20 1349 09/11/20 0836 09/23/20 1318         Exercise Goal Re-Evaluation   Exercise Goals Review Increase Physical Activity;Increase Strength and Stamina;Understanding of Exercise Prescription Increase Physical Activity;Increase Strength and Stamina;Understanding of Exercise Prescription Increase Physical Activity;Increase Strength and Stamina;Understanding of Exercise Prescription     Comments Hudson is doing some exercise at home. 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 States he does take his HR at home and was reminded to use RPE scale at home to gauge intensity and how hard he is working. Continues to do leg exercises at home. SOB at home has subsided but still uses PLB technique when needed. Jaran continues to do well in rehab.  He is up to 4.5% grade on the treadmill.  We will continue to monitor his progress. Quamel attends consistently and works at correct RPE.  He uses 4 lb weights. Staff willfollow up to see if he woudl like to try 5 lb     Expected Outcomes Short: Use RPE at home during exercise Long: Be able to exercise independently at home without any complications Short: Increase NuStep to level 4 Long: Continue to improve stamina. Short: increase to 5 lb Long: increase MET level overall            Discharge Exercise Prescription (Final Exercise Prescription Changes):  Exercise Prescription Changes - 09/23/20 1300      Response to Exercise   Blood Pressure (  Admit) 106/58    Blood Pressure (Exercise) 120/58    Blood Pressure (Exit) 108/56    Heart Rate (Admit) 77 bpm    Heart Rate (Exercise) 97 bpm    Heart Rate (Exit) 78 bpm    Rating of Perceived Exertion (Exercise) 14    Symptoms none    Duration Continue with 30 min of aerobic exercise without signs/symptoms of physical distress.    Intensity THRR unchanged      Progression   Progression Continue to progress workloads to maintain intensity without signs/symptoms of physical distress.    Average METs 2.2      Resistance Training   Training Prescription Yes    Weight 4 lb    Reps 10-15      Interval Training   Interval Training No      Treadmill   MPH 1.3    Grade 0    Minutes 15    METs 2      NuStep   Level 3    SPM 80    Minutes 15    METs 2.3      Home Exercise Plan   Plans to continue exercise at Home (comment)   walking, leg PT exercises   Frequency Add 3 additional  days to program exercise sessions.    Initial Home Exercises Provided 08/07/20           Nutrition:  Target Goals: Understanding of nutrition guidelines, daily intake of sodium <1528m, cholesterol <204m calories 30% from fat and 7% or less from saturated fats, daily to have 5 or more servings of fruits and vegetables.  Education: Controlling Sodium/Reading Food Labels -Group verbal and written material supporting the discussion of sodium use in heart healthy nutrition. Review and explanation with models, verbal and written materials for utilization of the food label.   Education: General Nutrition Guidelines/Fats and Fiber: -Group instruction provided by verbal, written material, models and posters to present the general guidelines for heart healthy nutrition. Gives an explanation and review of dietary fats and fiber.   Biometrics:    Nutrition Therapy Plan and Nutrition Goals:  Nutrition Therapy & Goals - 07/21/20 1403      Nutrition Therapy   Diet High calorie, High protein pulmonary MNT diet.    Protein (specify units) 80-85g    Fiber 30 grams    Whole Grain Foods 3 servings    Saturated Fats 12 max. grams    Fruits and Vegetables 5 servings/day    Sodium 1.5 grams      Personal Nutrition Goals   Nutrition Goal ST: increase protein at meals LT: gain strength    Comments Pt reports loving eggs and cereal (raisin bran) - whole milk or waffles or pancake. L: sandwich (tomato right now) (white bread) or corn on the cobb or some beans (pintos) D: pork and beans. Discussed Pulmonary Nutrition, heart healthy nutrition, and high protein nutrition. His COPD is newly diagnosed and he is in remission of cancer - treatments ended in May. Pt reports not getting strength back.      Intervention Plan   Intervention Prescribe, educate and counsel regarding individualized specific dietary modifications aiming towards targeted core components such as weight, hypertension, lipid management,  diabetes, heart failure and other comorbidities.;Nutrition handout(s) given to patient.    Expected Outcomes Short Term Goal: Understand basic principles of dietary content, such as calories, fat, sodium, cholesterol and nutrients.;Long Term Goal: Adherence to prescribed nutrition plan.;Short Term Goal: A plan has been developed  with personal nutrition goals set during dietitian appointment.           Nutrition Assessments:  Nutrition Assessments - 07/21/20 1526      MEDFICTS Scores   Pre Score 52           MEDIFICTS Score Key:          ?70 Need to make dietary changes          40-70 Heart Healthy Diet         ? 40 Therapeutic Level Cholesterol Diet  Nutrition Goals Re-Evaluation:  Nutrition Goals Re-Evaluation    [place] Name 08/20/20 1343 09/01/20 1344           Goals   Nutrition Goal ST: increase protein at meals LT: gain strength ST: increase protein at meals LT: gain strength      Comment continue with current changes. Pt is currently grieving the loss of his neighbor. Pt reports his strength is increases. Has been increasing his protein such as eggs. Continue with current changes.      Expected Outcome ST: increase protein at meals LT: gain strength ST: increase protein at meals LT: gain strength             Nutrition Goals Discharge (Final Nutrition Goals Re-Evaluation):  Nutrition Goals Re-Evaluation - 09/01/20 1344      Goals   Nutrition Goal ST: increase protein at meals LT: gain strength    Comment Pt reports his strength is increases. Has been increasing his protein such as eggs. Continue with current changes.    Expected Outcome ST: increase protein at meals LT: gain strength           Psychosocial: Target Goals: Acknowledge presence or absence of significant depression and/or stress, maximize coping skills, provide positive support system. Participant is able to verbalize types and ability to use techniques and skills needed for reducing stress and  depression.   Education: Depression - Provides group verbal and written instruction on the correlation between heart/lung disease and depressed mood, treatment options, and the stigmas associated with seeking treatment.   Education: Sleep Hygiene -Provides group verbal and written instruction about how sleep can affect your health.  Define sleep hygiene, discuss sleep cycles and impact of sleep habits. Review good sleep hygiene tips.     Education: Stress and Anxiety: - Provides group verbal and written instruction about the health risks of elevated stress and causes of high stress.  Discuss the correlation between heart/lung disease and anxiety and treatment options. Review healthy ways to manage with stress and anxiety.    Initial Review & Psychosocial Screening:  Initial Psych Review & Screening - 07/17/20 1512      Initial Review   Current issues with Current Stress Concerns    Source of Stress Concerns Chronic Illness    Comments Just finished cancer treament May   still weak SOB,  then TAVR    wants to feel stronger   improve SOB      Family Dynamics   Good Support System? Yes   Daughter  [name]   Concerns Recent loss of significant other    Comments Wife of almost 54 years passed away in 17-Feb-2019      Barriers   Psychosocial barriers to participate in program There are no identifiable barriers or psychosocial needs.      Screening Interventions   Interventions Encouraged to exercise;Provide feedback about the scores to participant;To provide support and resources with identified psychosocial needs  Expected Outcomes Short Term goal: Utilizing psychosocial counselor, staff and physician to assist with identification of specific Stressors or current issues interfering with healing process. Setting desired goal for each stressor or current issue identified.;Long Term Goal: Stressors or current issues are controlled or eliminated.;Short Term goal: Identification and review with  participant of any Quality of Life or Depression concerns found by scoring the questionnaire.;Long Term goal: The participant improves quality of Life and PHQ9 Scores as seen by post scores and/or verbalization of changes           Quality of Life Scores:   Quality of Life - 07/21/20 1519      Quality of Life   Select Quality of Life      Quality of Life Scores   Health/Function Pre 26.14 %    Socioeconomic Pre 30 %    Psych/Spiritual Pre 28.29 %    Family Pre 30 %    GLOBAL Pre 28 %          Scores of 19 and below usually indicate a poorer quality of life in these areas.  A difference of  2-3 points is a clinically meaningful difference.  A difference of 2-3 points in the total score of the Quality of Life Index has been associated with significant improvement in overall quality of life, self-image, physical symptoms, and general health in studies assessing change in quality of life.  PHQ-9: Recent Review Flowsheet Data    Depression screen [facility] 2/9 08/20/2020 07/21/2020 07/11/2020   Decreased Interest 0 0 0   Down, Depressed, Hopeless 0 0 0   PHQ - 2 Score 0 0 0   Altered sleeping 1 3  -   Tired, decreased energy 2 3  -   Change in appetite 1 0 -   Feeling bad or failure about yourself  0 0 -   Trouble concentrating 0 0 -   Moving slowly or fidgety/restless 1 0 -   Suicidal thoughts 0 0 -   PHQ-9 Score 5 6 -   Difficult doing work/chores Not difficult at all - -     Interpretation of Total Score  Total Score Depression Severity:  1-4 = Minimal depression, 5-9 = Mild depression, 10-14 = Moderate depression, 15-19 = Moderately severe depression, 20-27 = Severe depression   Psychosocial Evaluation and Intervention:  Psychosocial Evaluation - 07/17/20 1519      Psychosocial Evaluation & Interventions   Interventions Encouraged to exercise with the program and follow exercise prescription    Comments Heath has no barriers to attending the prgram. He hopes to impove his energy  levels and decrease his SOB.  He recently completed Prostate Cancer treatments and current TAVR.  He lives alone as his wife of almost 5 years passed away in Mar 13, 2019. His daughter [name] checks on him several times a day. He should do well in the program.    Expected Outcomes STG: Json attends all scheduled sessions  LTG: Shaylon sees improvement in his energy and SOB symptoms. He is able to utilize the exercise and education learned in program to keep his progress moving forward.    Continue Psychosocial Services  Follow up required by staff           Psychosocial Re-Evaluation:  Psychosocial Re-Evaluation    [place] Name 08/07/20 1352 09/08/20 1406           Psychosocial Re-Evaluation   Current issues with Current Stress Concerns Current Stress Concerns      Comments  Kalix's neighbor just passed away this week and was a close friend.  He is helping the daughters plan the funeral.  He enjoys spending time with his kids and grandkids.  He is sleeping well, but his biggest concern is his breathing .  He often feels short of breath with activity, he does feel when he using PLB. Mathew completed his PHQ a couple weeks ago and improved by 1 point for a total of 5. He continues to hang out with his grandchildren which is helping  his social life and maintain a positive attitude.  His sleep pattern has been off and on- he is currently seeing someone new in his relationship. Encouraged to speak with doctor regarding sleep as he states he is on medication and feels it is not helping      Expected Outcomes Short: Cope with loss of friend  Long: Continue to focus on the positive. Short: Speak to doctor regarding sleep medications Long: Continue to maintain positive attitude/utilize exercise for stress management      Continue Psychosocial Services  -- Follow up required by staff             Psychosocial Discharge (Final Psychosocial Re-Evaluation):  Psychosocial Re-Evaluation - 09/08/20 1406       Psychosocial Re-Evaluation   Current issues with Current Stress Concerns    Comments Neil completed his PHQ a couple weeks ago and improved by 1 point for a total of 5. He continues to hang out with his grandchildren which is helping  his social life and maintain a positive attitude.  His sleep pattern has been off and on- he is currently seeing someone new in his relationship. Encouraged to speak with doctor regarding sleep as he states he is on medication and feels it is not helping    Expected Outcomes Short: Speak to doctor regarding sleep medications Long: Continue to maintain positive attitude/utilize exercise for stress management    Continue Psychosocial Services  Follow up required by staff           Vocational Rehabilitation: Provide vocational rehab assistance to qualifying candidates.   Vocational Rehab Evaluation & Intervention:  Vocational Rehab - 07/17/20 1514      Initial Vocational Rehab Evaluation & Intervention   Assessment shows need for Vocational Rehabilitation No           Education: Education Goals: Education classes will be provided on a variety of topics geared toward better understanding of heart health and risk factor modification. Participant will state understanding/return demonstration of topics presented as noted by education test scores.  Learning Barriers/Preferences:  Learning Barriers/Preferences - 07/17/20 1519      Learning Barriers/Preferences   Learning Barriers None    Learning Preferences None           General Cardiac Education Topics:  AED/CPR: - Group verbal and written instruction with the use of models to demonstrate the basic use of the AED with the basic ABC's of resuscitation.   Anatomy & Physiology of the Heart: - Group verbal and written instruction and models provide basic cardiac anatomy and physiology, with the coronary electrical and arterial systems. Review of Valvular disease and Heart Failure   Cardiac  Procedures: - Group verbal and written instruction to review commonly prescribed medications for heart disease. Reviews the medication, class of the drug, and side effects. Includes the steps to properly store meds and maintain the prescription regimen. (beta blockers and nitrates)   Cardiac Medications I: - Group verbal and  written instruction to review commonly prescribed medications for heart disease. Reviews the medication, class of the drug, and side effects. Includes the steps to properly store meds and maintain the prescription regimen.   Cardiac Medications II: -Group verbal and written instruction to review commonly prescribed medications for heart disease. Reviews the medication, class of the drug, and side effects. (all other drug classes)    Go Sex-Intimacy & Heart Disease, Get SMART - Goal Setting: - Group verbal and written instruction through game format to discuss heart disease and the return to sexual intimacy. Provides group verbal and written material to discuss and apply goal setting through the application of the S.M.A.R.T. Method.   Other Matters of the Heart: - Provides group verbal, written materials and models to describe Stable Angina and Peripheral Artery. Includes description of the disease process and treatment options available to the cardiac patient.   Infection Prevention: - Provides verbal and written material to individual with discussion of infection control including proper hand washing and proper equipment cleaning during exercise session.   Cardiac Rehab from 07/21/2020 in [facility] Cardiac and Pulmonary Rehab  Date 07/21/20  Educator AS  Instruction Review Code 1- Verbalizes Understanding      Falls Prevention: - Provides verbal and written material to individual with discussion of falls prevention and safety.   Cardiac Rehab from 07/21/2020 in [facility] Cardiac and Pulmonary Rehab  Date 07/21/20  Educator AS  Instruction Review Code 1- Verbalizes  Understanding      Other: -Provides group and verbal instruction on various topics (see comments)   Knowledge Questionnaire Score:  Knowledge Questionnaire Score - 07/21/20 1523      Knowledge Questionnaire Score   Pre Score 21/26 heart rate nutrition           Core Components/Risk Factors/Patient Goals at Admission:  Personal Goals and Risk Factors at Admission - 07/17/20 1509      Core Components/Risk Factors/Patient Goals on Admission    Weight Management Yes;Weight Maintenance    Intervention Weight Management: Develop a combined nutrition and exercise program designed to reach desired caloric intake, while maintaining appropriate intake of nutrient and fiber, sodium and fats, and appropriate energy expenditure required for the weight goal.;Weight Management: Provide education and appropriate resources to help participant work on and attain dietary goals.    Admit Weight 182 lb (82.6 kg)    Goal Weight: Short Term 182 lb (82.6 kg)    Goal Weight: Long Term 182 lb (82.6 kg)    Expected Outcomes Short Term: Continue to assess and modify interventions until short term weight is achieved;Long Term: Adherence to nutrition and physical activity/exercise program aimed toward attainment of established weight goal;Weight Maintenance: Understanding of the daily nutrition guidelines, which includes 25-35% calories from fat, 7% or less cal from saturated fats, less than 225m cholesterol, less than 1.5gm of sodium, & 5 or more servings of fruits and vegetables daily    Hypertension Yes    Intervention Provide education on lifestyle modifcations including regular physical activity/exercise, weight management, moderate sodium restriction and increased consumption of fresh fruit, vegetables, and low fat dairy, alcohol moderation, and smoking cessation.;Monitor prescription use compliance.    Expected Outcomes Short Term: Continued assessment and intervention until BP is < 140/953mHG in  hypertensive participants. < 130/8015mG in hypertensive participants with diabetes, heart failure or chronic kidney disease.;Long Term: Maintenance of blood pressure at goal levels.    Lipids Yes    Intervention Provide education and support for participant on  nutrition & aerobic/resistive exercise along with prescribed medications to achieve LDL <32m, HDL >467m    Expected Outcomes Short Term: Participant states understanding of desired cholesterol values and is compliant with medications prescribed. Participant is following exercise prescription and nutrition guidelines.;Long Term: Cholesterol controlled with medications as prescribed, with individualized exercise RX and with personalized nutrition plan. Value goals: LDL < 7064mHDL > 40 mg.           Education:Diabetes - Individual verbal and written instruction to review signs/symptoms of diabetes, desired ranges of glucose level fasting, after meals and with exercise. Acknowledge that pre and post exercise glucose checks will be done for 3 sessions at entry of program.   Education: Know Your Numbers and Risk Factors: -Group verbal and written instruction about important numbers in your health.  Discussion of what are risk factors and how they play a role in the disease process.  Review of Cholesterol, Blood Pressure, Diabetes, and BMI and the role they play in your overall health.   Core Components/Risk Factors/Patient Goals Review:   Goals and Risk Factor Review    Row Name 08/07/20 1349 09/08/20 1403           Core Components/Risk Factors/Patient Goals Review   Personal Goals Review Weight Management/Obesity;Improve shortness of breath with ADL's;Hypertension;Lipids Weight Management/Obesity;Improve shortness of breath with ADL's;Hypertension;Lipids      Review EddNate doing well in rehab.  His weight is staying steady.  His breathing continues to be his biggest problem.  His pressures are doing well mostly.  Yesterday, it was  low after class, but overall doing well.  He tries to check it at least twice a day. EddNizars been doing great. Weight has been staying steady and he weighs himself at home 2-3x/week, he also keeps a chart at home. BP at home have also been stable around [id number]. Reports it gets low sometimes. Encouraged to stay hydrated if his BP gets low at times. Knows to use PLB techniques when needed.      Expected Outcomes Short: Continue to keep eye on blood pressure Long: Continue to monitor risk factors. Short: Stayed hydrated to maintain stable BPs Long: Manage lifestyle risk factors independently             Core Components/Risk Factors/Patient Goals at Discharge (Final Review):   Goals and Risk Factor Review - 09/08/20 1403      Core Components/Risk Factors/Patient Goals Review   Personal Goals Review Weight Management/Obesity;Improve shortness of breath with ADL's;Hypertension;Lipids    Review EddDurantes been doing great. Weight has been staying steady and he weighs himself at home 2-3x/week, he also keeps a chart at home. BP at home have also been stable around [id number]. Reports it gets low sometimes. Encouraged to stay hydrated if his BP gets low at times. Knows to use PLB techniques when needed.    Expected Outcomes Short: Stayed hydrated to maintain stable BPs Long: Manage lifestyle risk factors independently           ITP Comments:  ITP Comments    Row Name 07/17/20 1523 07/21/20 1536 07/23/20 1340 07/30/20 0726 08/27/20 1634   ITP Comments Virtual orientation call completed today. he has an appointment on Date: 07/21/2020 for EP eval and gym Orientation.  [facility]. Completed 6MWT and gym orientation. Initial ITP created and sent for review to Dr. MarEmily Filbertedical Director. First full day of exercise!  Patient was oriented to  gym and equipment including functions, settings, policies, and procedures.  Patient's  individual exercise prescription and treatment plan were reviewed.  All starting workloads were established based on the results of the 6 minute walk test done at initial orientation visit.  The plan for exercise progression was also introduced and progression will be customized based on patient's performance and goals. 30 Day review completed. Medical Director ITP review done, changes made as directed, and signed approval by Medical Director. 30 day review completed. ITP sent to Dr. Emily Filbert, Medical Director of Cardiac and Pulmonary Rehab. Continue with ITP unless changes are made by physician.          Comments:

## 2020-09-24 NOTE — Progress Notes (Signed)
Daily Session Note  Patient Details  Name: Kristopher Fernandez MRN: [id number] Date of Birth: 02-27-1932 Referring Provider:     Cardiac Rehab from 07/21/2020 in [facility] Cardiac and Pulmonary Rehab  Referring Provider Aundra Dubin      Encounter Date: 09/24/2020  Check In:  Session Check In - 09/24/20 1348      Check-In   Supervising physician immediately available to respond to emergencies See telemetry face sheet for immediately available ER MD    Location [facility]-Cardiac & Pulmonary Rehab    Staff Present Renita Papa, RN BSN;Joseph Lou Miner, [place] Exercise Physiologist    Virtual Visit No    Medication changes reported     No    Fall or balance concerns reported    No    Warm-up and Cool-down Performed on first and last piece of equipment    Resistance Training Performed Yes    VAD Patient? No    PAD/SET Patient? No      Pain Assessment   Currently in Pain? No/denies              Social History   Tobacco Use  Smoking Status Former Smoker  . Years: 10.00  . Types: Cigarettes  . Quit date: 35  . Years since quitting: 49.7  Smokeless Tobacco Former Systems developer  . Types: Chew  Tobacco Comment   Quit 1977    Goals Met:  Independence with exercise equipment Exercise tolerated well No report of cardiac concerns or symptoms Strength training completed today  Goals Unmet:  Not Applicable  Comments: Pt able to follow exercise prescription today without complaint.  Will continue to monitor for progression.    Dr. Emily Filbert is Medical Director for [place] and [facility].

## 2020-09-25 ENCOUNTER — Other Ambulatory Visit: Payer: Self-pay

## 2020-09-25 ENCOUNTER — Encounter: Payer: Medicare Other | Admitting: *Deleted

## 2020-09-25 DIAGNOSIS — Z952 Presence of prosthetic heart valve: Secondary | ICD-10-CM

## 2020-09-25 NOTE — Progress Notes (Signed)
Daily Session Note  Patient Details  Name: Kristopher Fernandez MRN: [id number] Date of Birth: [birth year] Referring Provider:     Cardiac Rehab from 07/21/2020 in [facility] Cardiac and Pulmonary Rehab  Referring Provider Aundra Dubin      Encounter Date: 09/25/2020  Check In:  Session Check In - 09/25/20 1340      Check-In   Supervising physician immediately available to respond to emergencies See telemetry face sheet for immediately available ER MD    Location [facility]-Cardiac & Pulmonary Rehab    Staff Present Renita Papa, RN BSN;Joseph [address] [place], [place], RCEP, CCRP, CCET    Virtual Visit No    Medication changes reported     No    Fall or balance concerns reported    No    Warm-up and Cool-down Performed on first and last piece of equipment    Resistance Training Performed Yes    VAD Patient? No    PAD/SET Patient? No      Pain Assessment   Currently in Pain? No/denies              Social History   Tobacco Use  Smoking Status Former Smoker  . Years: 10.00  . Types: Cigarettes  . Quit date: 82  . Years since quitting: 49.8  Smokeless Tobacco Former Systems developer  . Types: Chew  Tobacco Comment   Quit 1977    Goals Met:  Independence with exercise equipment Exercise tolerated well No report of cardiac concerns or symptoms Strength training completed today  Goals Unmet:  Not Applicable  Comments: Pt able to follow exercise prescription today without complaint.  Will continue to monitor for progression.    Dr. Emily Filbert is Medical Director for [place] and [facility].

## 2020-09-29 ENCOUNTER — Encounter: Payer: Medicare Other | Admitting: *Deleted

## 2020-09-29 ENCOUNTER — Other Ambulatory Visit: Payer: Self-pay

## 2020-09-29 DIAGNOSIS — Z952 Presence of prosthetic heart valve: Secondary | ICD-10-CM | POA: Diagnosis not present

## 2020-09-29 NOTE — Progress Notes (Signed)
Daily Session Note  Patient Details  Name: Kristopher Fernandez MRN: [id number] Date of Birth: 1932/02/09 Referring Provider:     Cardiac Rehab from 07/21/2020 in [facility] Cardiac and Pulmonary Rehab  Referring Provider Aundra Dubin      Encounter Date: 09/29/2020  Check In:  Session Check In - 09/29/20 1351      Check-In   Supervising physician immediately available to respond to emergencies See telemetry face sheet for immediately available ER MD    Location [facility]-Cardiac & Pulmonary Rehab    Staff Present Renita Papa, RN Margurite Auerbach, MS Exercise Physiologist;Kelly Amedeo Plenty, BS, ACSM CEP, Exercise Physiologist    Virtual Visit No    Medication changes reported     No    Fall or balance concerns reported    No    Warm-up and Cool-down Performed on first and last piece of equipment    Resistance Training Performed Yes    VAD Patient? No    PAD/SET Patient? No      Pain Assessment   Currently in Pain? No/denies              Social History   Tobacco Use  Smoking Status Former Smoker   Years: 10.00   Types: Cigarettes   Quit date: 1972   Years since quitting: 49.8  Smokeless Tobacco Former Systems developer   Types: Chew  Tobacco Comment   Quit 1977    Goals Met:  Independence with exercise equipment Exercise tolerated well No report of cardiac concerns or symptoms Strength training completed today  Goals Unmet:  Not Applicable  Comments: Pt able to follow exercise prescription today without complaint.  Will continue to monitor for progression.    Dr. Emily Filbert is Medical Director for [place] and [facility].

## 2020-10-01 ENCOUNTER — Encounter: Payer: Medicare Other | Admitting: *Deleted

## 2020-10-01 ENCOUNTER — Other Ambulatory Visit: Payer: Self-pay

## 2020-10-01 DIAGNOSIS — Z952 Presence of prosthetic heart valve: Secondary | ICD-10-CM

## 2020-10-01 NOTE — Progress Notes (Signed)
Daily Session Note  Patient Details  Name: Kristopher Fernandez MRN: [id number] Date of Birth: [birth year] Referring Provider:     Cardiac Rehab from 07/21/2020 in [facility] Cardiac and Pulmonary Rehab  Referring Provider Aundra Dubin      Encounter Date: 10/01/2020  Check In:  Session Check In - 10/01/20 1345      Check-In   Supervising physician immediately available to respond to emergencies See telemetry face sheet for immediately available ER MD    Location [facility]-Cardiac & Pulmonary Rehab    Staff Present Renita Papa, RN BSN;Joseph Lou Miner, [place] Exercise Physiologist;Amanda Oletta Darter, [place], ACSM CEP, Exercise Physiologist    Virtual Visit No    Medication changes reported     No    Fall or balance concerns reported    No    Warm-up and Cool-down Performed on first and last piece of equipment    Resistance Training Performed Yes    VAD Patient? No    PAD/SET Patient? No      Pain Assessment   Currently in Pain? No/denies              Social History   Tobacco Use  Smoking Status Former Smoker  . Years: 10.00  . Types: Cigarettes  . Quit date: 41  . Years since quitting: 49.8  Smokeless Tobacco Former Systems developer  . Types: Chew  Tobacco Comment   Quit 1977    Goals Met:  Independence with exercise equipment Exercise tolerated well No report of cardiac concerns or symptoms Strength training completed today  Goals Unmet:  Not Applicable  Comments: Pt able to follow exercise prescription today without complaint.  Will continue to monitor for progression.    Dr. Emily Filbert is Medical Director for [place] and [facility].

## 2020-10-02 ENCOUNTER — Other Ambulatory Visit: Payer: Self-pay

## 2020-10-02 ENCOUNTER — Encounter: Payer: Medicare Other | Admitting: *Deleted

## 2020-10-02 DIAGNOSIS — Z952 Presence of prosthetic heart valve: Secondary | ICD-10-CM

## 2020-10-02 NOTE — Progress Notes (Signed)
Daily Session Note  Patient Details  Name: NYCERE PRESLEY MRN: [id number] Date of Birth: 10-27-1932 Referring Provider:     Cardiac Rehab from 07/21/2020 in [facility] Cardiac and Pulmonary Rehab  Referring Provider Aundra Dubin      Encounter Date: 10/02/2020  Check In:  Session Check In - 10/02/20 1350      Check-In   Supervising physician immediately available to respond to emergencies See telemetry face sheet for immediately available ER MD    Location [facility]-Cardiac & Pulmonary Rehab    Staff Present Renita Papa, RN BSN;Joseph [address] [place], [place], RCEP, CCRP, CCET    Virtual Visit No    Medication changes reported     No    Fall or balance concerns reported    No    Warm-up and Cool-down Performed on first and last piece of equipment    Resistance Training Performed Yes    VAD Patient? No    PAD/SET Patient? No      Pain Assessment   Currently in Pain? No/denies              Social History   Tobacco Use  Smoking Status Former Smoker  . Years: 10.00  . Types: Cigarettes  . Quit date: 59  . Years since quitting: 49.8  Smokeless Tobacco Former Systems developer  . Types: Chew  Tobacco Comment   Quit 1977    Goals Met:  Independence with exercise equipment Exercise tolerated well No report of cardiac concerns or symptoms Strength training completed today  Goals Unmet:  Not Applicable  Comments: Pt able to follow exercise prescription today without complaint.  Will continue to monitor for progression.    Dr. Emily Filbert is Medical Director for [place] and [facility].

## 2020-10-06 ENCOUNTER — Encounter: Payer: Medicare Other | Admitting: *Deleted

## 2020-10-06 ENCOUNTER — Other Ambulatory Visit: Payer: Self-pay

## 2020-10-06 DIAGNOSIS — Z952 Presence of prosthetic heart valve: Secondary | ICD-10-CM

## 2020-10-06 NOTE — Progress Notes (Signed)
Daily Session Note  Patient Details  Name: Kristopher Fernandez MRN: [id number] Date of Birth: 1932-10-25 Referring Provider:     Cardiac Rehab from 07/21/2020 in [facility] Cardiac and Pulmonary Rehab  Referring Provider Aundra Dubin      Encounter Date: 10/06/2020  Check In:  Session Check In - 10/06/20 1345      Check-In   Supervising physician immediately available to respond to emergencies See telemetry face sheet for immediately available ER MD    Location [facility]-Cardiac & Pulmonary Rehab    Staff Present Renita Papa, RN Margurite Auerbach, MS Exercise Physiologist;Kelly Amedeo Plenty, BS, ACSM CEP, Exercise Physiologist;Other   Birdie Sons RN   Virtual Visit No    Medication changes reported     No    Fall or balance concerns reported    No    Warm-up and Cool-down Performed on first and last piece of equipment    Resistance Training Performed Yes    VAD Patient? No    PAD/SET Patient? No      Pain Assessment   Currently in Pain? No/denies              Social History   Tobacco Use  Smoking Status Former Smoker  . Years: 10.00  . Types: Cigarettes  . Quit date: 27  . Years since quitting: 49.8  Smokeless Tobacco Former Systems developer  . Types: Chew  Tobacco Comment   Quit 1977    Goals Met:  Independence with exercise equipment Exercise tolerated well No report of cardiac concerns or symptoms Strength training completed today  Goals Unmet:  Not Applicable  Comments: Pt able to follow exercise prescription today without complaint.  Will continue to monitor for progression.    Dr. Emily Filbert is Medical Director for [place] and [facility].

## 2020-10-07 ENCOUNTER — Ambulatory Visit: Payer: Medicare Other | Admitting: Adult Health

## 2020-10-07 ENCOUNTER — Encounter: Payer: Self-pay | Admitting: Adult Health

## 2020-10-08 ENCOUNTER — Other Ambulatory Visit: Payer: Self-pay

## 2020-10-08 DIAGNOSIS — Z952 Presence of prosthetic heart valve: Secondary | ICD-10-CM

## 2020-10-08 NOTE — Progress Notes (Signed)
Daily Session Note  Patient Details  Name: Kristopher Fernandez MRN: [id number] Date of Birth: [birth year] Referring Provider:     Cardiac Rehab from 07/21/2020 in [facility] Cardiac and Pulmonary Rehab  Referring Provider Aundra Dubin      Encounter Date: 10/08/2020  Check In:  Session Check In - 10/08/20 1417      Check-In   Supervising physician immediately available to respond to emergencies See telemetry face sheet for immediately available ER MD    Location [facility]-Cardiac & Pulmonary Rehab    Staff Present Coralie Keens, MS Exercise Physiologist;Meredith Sherryll Burger, RN Sherryl Barters, MPA, RN;Joseph Tessie Fass RCP,RRT,BSRT    Virtual Visit No    Medication changes reported     No    Fall or balance concerns reported    No    Warm-up and Cool-down Performed on first and last piece of equipment    Resistance Training Performed Yes    VAD Patient? No    PAD/SET Patient? No      Pain Assessment   Currently in Pain? No/denies              Social History   Tobacco Use  Smoking Status Former Smoker  . Years: 10.00  . Types: Cigarettes  . Quit date: 25  . Years since quitting: 49.8  Smokeless Tobacco Former Systems developer  . Types: Chew  Tobacco Comment   Quit 1977    Goals Met:  Independence with exercise equipment Exercise tolerated well No report of cardiac concerns or symptoms Strength training completed today  Goals Unmet:  Not Applicable  Comments: Pt able to follow exercise prescription today without complaint.  Will continue to monitor for progression.   Dr. Emily Filbert is Medical Director for [place] and [facility].

## 2020-10-09 ENCOUNTER — Encounter: Payer: Medicare Other | Admitting: *Deleted

## 2020-10-09 ENCOUNTER — Other Ambulatory Visit: Payer: Self-pay

## 2020-10-09 DIAGNOSIS — Z952 Presence of prosthetic heart valve: Secondary | ICD-10-CM | POA: Diagnosis not present

## 2020-10-09 NOTE — Patient Instructions (Signed)
Discharge Patient Instructions  Patient Details  Name: Kristopher Fernandez MRN: [id number] Date of Birth: 1932-09-01 Referring Provider:  Yolonda Kida, MD   Number of Visits: 39  Reason for Discharge:  Patient reached a stable level of exercise. Patient independent in their exercise. Patient has met program and personal goals.  Smoking History:  Social History   Tobacco Use  Smoking Status Former Smoker  . Years: 10.00  . Types: Cigarettes  . Quit date: 12  . Years since quitting: 49.8  Smokeless Tobacco Former Systems developer  . Types: Chew  Tobacco Comment   Quit 1977    Diagnosis:  S/P TAVR (transcatheter aortic valve replacement)  Initial Exercise Prescription:  Initial Exercise Prescription - 07/21/20 1500      Date of Initial Exercise RX and Referring Provider   Date 07/21/20    Referring Provider Aundra Dubin      Treadmill   MPH 1    Grade 0    Minutes 15    METs 1.77      Recumbant Bike   Level 1    RPM 60    Minutes 15    METs 1      NuStep   Level 1    SPM 80    Minutes 15    METs 1      Prescription Details   Frequency (times per week) 3    Duration Progress to 30 minutes of continuous aerobic without signs/symptoms of physical distress      Intensity   THRR 40-80% of Max Heartrate 100-123    Ratings of Perceived Exertion 11-13    Perceived Dyspnea 0-4      Resistance Training   Training Prescription Yes    Weight 3 lb    Reps 10-15           Discharge Exercise Prescription (Final Exercise Prescription Changes):  Exercise Prescription Changes - 10/07/20 1500      Response to Exercise   Blood Pressure (Admit) 100/80    Blood Pressure (Exercise) 110/62    Blood Pressure (Exit) 112/62    Heart Rate (Admit) 77 bpm    Heart Rate (Exercise) 98 bpm    Heart Rate (Exit) 80 bpm    Rating of Perceived Exertion (Exercise) 14    Symptoms none    Duration Continue with 30 min of aerobic exercise without signs/symptoms of physical distress.     Intensity THRR unchanged      Progression   Progression Continue to progress workloads to maintain intensity without signs/symptoms of physical distress.    Average METs 2.25      Resistance Training   Training Prescription Yes    Weight 4 lb    Reps 10-15      Interval Training   Interval Training No      Treadmill   MPH 1.3    Grade 0    Minutes 15    METs 2      NuStep   Level 4    Minutes 15    METs 2.5      Home Exercise Plan   Plans to continue exercise at Home (comment)   walking, leg PT exercises   Frequency Add 3 additional days to program exercise sessions.    Initial Home Exercises Provided 08/07/20           Functional Capacity:  6 Minute Walk    Row Name 07/21/20 1505 10/01/20 1518       6  Minute Walk   Phase Initial Discharge    Distance 655 feet 725 feet    Distance % Change -- 10.68 %    Distance Feet Change -- 70 ft    Walk Time 6 minutes 6 minutes    # of Rest Breaks 0 0    MPH 1.24 1.37    METS 1 1.18    RPE 13 13    Perceived Dyspnea  2 3    VO2 Peak 3.5 4.16    Symptoms No Yes (comment)    Comments -- SOB, resolved with rest    Resting HR 77 bpm 73 bpm    Resting BP 112/66 122/62    Resting Oxygen Saturation  94 % 92 %    Exercise Oxygen Saturation  during 6 min walk 88 % 90 %    Max Ex. HR 91 bpm 98 bpm    Max Ex. BP 138/66 136/64    2 Minute Post BP 118/68 116/62           Quality of Life:  Quality of Life - 07/21/20 1519      Quality of Life   Select Quality of Life      Quality of Life Scores   Health/Function Pre 26.14 %    Socioeconomic Pre 30 %    Psych/Spiritual Pre 28.29 %    Family Pre 30 %    GLOBAL Pre 28 %           Personal Goals: Goals established at orientation with interventions provided to work toward goal.  Personal Goals and Risk Factors at Admission - 07/17/20 1509      Core Components/Risk Factors/Patient Goals on Admission    Weight Management Yes;Weight Maintenance    Intervention  Weight Management: Develop a combined nutrition and exercise program designed to reach desired caloric intake, while maintaining appropriate intake of nutrient and fiber, sodium and fats, and appropriate energy expenditure required for the weight goal.;Weight Management: Provide education and appropriate resources to help participant work on and attain dietary goals.    Admit Weight 182 lb (82.6 kg)    Goal Weight: Short Term 182 lb (82.6 kg)    Goal Weight: Long Term 182 lb (82.6 kg)    Expected Outcomes Short Term: Continue to assess and modify interventions until short term weight is achieved;Long Term: Adherence to nutrition and physical activity/exercise program aimed toward attainment of established weight goal;Weight Maintenance: Understanding of the daily nutrition guidelines, which includes 25-35% calories from fat, 7% or less cal from saturated fats, less than $RemoveB'200mg'VMMSiIYd$  cholesterol, less than 1.5gm of sodium, & 5 or more servings of fruits and vegetables daily    Hypertension Yes    Intervention Provide education on lifestyle modifcations including regular physical activity/exercise, weight management, moderate sodium restriction and increased consumption of fresh fruit, vegetables, and low fat dairy, alcohol moderation, and smoking cessation.;Monitor prescription use compliance.    Expected Outcomes Short Term: Continued assessment and intervention until BP is < 140/49mm HG in hypertensive participants. < 130/59mm HG in hypertensive participants with diabetes, heart failure or chronic kidney disease.;Long Term: Maintenance of blood pressure at goal levels.    Lipids Yes    Intervention Provide education and support for participant on nutrition & aerobic/resistive exercise along with prescribed medications to achieve LDL '70mg'$ , HDL >$Remo'40mg'vVbxb$ .    Expected Outcomes Short Term: Participant states understanding of desired cholesterol values and is compliant with medications prescribed. Participant is  following exercise prescription and nutrition guidelines.;Long  Term: Cholesterol controlled with medications as prescribed, with individualized exercise RX and with personalized nutrition plan. Value goals: LDL < $Rem'70mg'qiqR$ , HDL > 40 mg.            Personal Goals Discharge:  Goals and Risk Factor Review - 09/25/20 1403      Core Components/Risk Factors/Patient Goals Review   Personal Goals Review Weight Management/Obesity;Improve shortness of breath with ADL's;Hypertension;Lipids    Review Sabrina has been able to maintain his weight and feeling good overall. His pressures have been good. His breathing has improved since he started rehab.  He is going to continue to monitor his pressures and use his PLB for his breathing.    Expected Outcomes Short: Continue to maintain weight Long; continue to montior risk factors.           Exercise Goals and Review:  Exercise Goals    Row Name 07/21/20 1516             Exercise Goals   Increase Physical Activity Yes       Intervention Provide advice, education, support and counseling about physical activity/exercise needs.;Develop an individualized exercise prescription for aerobic and resistive training based on initial evaluation findings, risk stratification, comorbidities and participant's personal goals.       Expected Outcomes Short Term: Attend rehab on a regular basis to increase amount of physical activity.;Long Term: Add in home exercise to make exercise part of routine and to increase amount of physical activity.;Long Term: Exercising regularly at least 3-5 days a week.       Increase Strength and Stamina Yes       Intervention Provide advice, education, support and counseling about physical activity/exercise needs.;Develop an individualized exercise prescription for aerobic and resistive training based on initial evaluation findings, risk stratification, comorbidities and participant's personal goals.       Expected Outcomes Short Term:  Increase workloads from initial exercise prescription for resistance, speed, and METs.;Short Term: Perform resistance training exercises routinely during rehab and add in resistance training at home;Long Term: Improve cardiorespiratory fitness, muscular endurance and strength as measured by increased METs and functional capacity (6MWT)       Able to understand and use rate of perceived exertion (RPE) scale Yes       Intervention Provide education and explanation on how to use RPE scale       Expected Outcomes Short Term: Able to use RPE daily in rehab to express subjective intensity level;Long Term:  Able to use RPE to guide intensity level when exercising independently       Able to understand and use Dyspnea scale Yes       Intervention Provide education and explanation on how to use Dyspnea scale       Expected Outcomes Short Term: Able to use Dyspnea scale daily in rehab to express subjective sense of shortness of breath during exertion;Long Term: Able to use Dyspnea scale to guide intensity level when exercising independently       Knowledge and understanding of Target Heart Rate Range (THRR) Yes       Intervention Provide education and explanation of THRR including how the numbers were predicted and where they are located for reference       Expected Outcomes Short Term: Able to state/look up THRR;Short Term: Able to use daily as guideline for intensity in rehab;Long Term: Able to use THRR to govern intensity when exercising independently       Able to check pulse independently Yes  Intervention Provide education and demonstration on how to check pulse in carotid and radial arteries.;Review the importance of being able to check your own pulse for safety during independent exercise       Expected Outcomes Short Term: Able to explain why pulse checking is important during independent exercise;Long Term: Able to check pulse independently and accurately       Understanding of Exercise  Prescription Yes       Intervention Provide education, explanation, and written materials on patient's individual exercise prescription       Expected Outcomes Short Term: Able to explain program exercise prescription;Long Term: Able to explain home exercise prescription to exercise independently              Exercise Goals Re-Evaluation:  Exercise Goals Re-Evaluation    Row Name 07/23/20 1341 07/30/20 1438 08/07/20 1347 08/13/20 1440 08/26/20 1500     Exercise Goal Re-Evaluation   Exercise Goals Review Increase Physical Activity;Able to understand and use rate of perceived exertion (RPE) scale;Knowledge and understanding of Target Heart Rate Range (THRR);Understanding of Exercise Prescription;Increase Strength and Stamina;Able to check pulse independently Increase Physical Activity;Able to understand and use rate of perceived exertion (RPE) scale;Knowledge and understanding of Target Heart Rate Range (THRR);Understanding of Exercise Prescription;Increase Strength and Stamina;Able to check pulse independently Increase Physical Activity;Increase Strength and Stamina;Understanding of Exercise Prescription Increase Physical Activity;Increase Strength and Stamina;Understanding of Exercise Prescription Increase Physical Activity;Increase Strength and Stamina;Understanding of Exercise Prescription   Comments Reviewed RPE and dyspnea scales, THR and program prescription with pt today.  Pt voiced understanding and was given a copy of goals to take home. Barney has tolerated exercise well in his first week of attendance. Staff will monitor progress. Belen is doing well in rehab.  He has already started to notice an increase in his lower body strength.  He is following his routine for his legs strength.  His biggest limitation continues to be his breathing. He is using his PLB to help.  Reviewed home exercise with pt today.  Pt plans to walk and continue with leg exercises at home for exercise.  Reviewed THR,  pulse, RPE, sign and symptoms, pulse oximetery and when to call 911 or MD.  Also discussed weather considerations and indoor options.  Pt voiced understanding. Dallyn continues to do well in rehab.  He is up to 4 lb handweights.  We will continue to monitor his progress. Huxley is progressing well and is up to level 3 on NS.  He attends consistently and works at correct RPE.   Expected Outcomes Short: Use RPE daily to regulate intensity. Long: Follow program prescription in THR. Short:  attend consistently Long: improve overall stamina Short: Start to add in walking at home Long: Continue to improve stamina. Short: Continue to attend consistently Long: Continue to walk at home on off days. Short: Continue to attend consistently Long: increase overall MET level   Row Name 09/08/20 1349 09/11/20 0836 09/23/20 1318 09/25/20 1358 10/07/20 1502     Exercise Goal Re-Evaluation   Exercise Goals Review Increase Physical Activity;Increase Strength and Stamina;Understanding of Exercise Prescription Increase Physical Activity;Increase Strength and Stamina;Understanding of Exercise Prescription Increase Physical Activity;Increase Strength and Stamina;Understanding of Exercise Prescription Increase Physical Activity;Increase Strength and Stamina;Understanding of Exercise Prescription Increase Physical Activity;Increase Strength and Stamina;Understanding of Exercise Prescription   Comments Keelan is doing some exercise at home. 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 States he  does take his HR at home and was reminded to use RPE scale at home to gauge intensity and how hard he is working. Continues to do leg exercises at home. SOB at home has subsided but still uses PLB technique when needed. Keysean continues to do well in rehab.  He is up to 4.5% grade on the treadmill.  We will continue to monitor his progress. Mikale attends consistently and works at  correct RPE.  He uses 4 lb weights. Staff willfollow up to see if he woudl like to try 5 lb [place] is due for his post 6MWT next week.  He has done well and we expect to see him improve. He is planning to walk and continue his routine with his leg exercises. Tishawn improved his post walk by 10%! He graduates next week! He is planning to continue to exercise by walking at home.   Expected Outcomes Short: Use RPE at home during exercise Long: Be able to exercise independently at home without any complications Short: Increase NuStep to level 4 Long: Continue to improve stamina. Short: increase to 5 lb Long: increase MET level overall Short: Improve post 6MWT  Long: Continue to exercise independently Continue to exercise indpendently          Nutrition & Weight - Outcomes:    Nutrition:  Nutrition Therapy & Goals - 07/21/20 1403      Nutrition Therapy   Diet High calorie, High protein pulmonary MNT diet.    Protein (specify units) 80-85g    Fiber 30 grams    Whole Grain Foods 3 servings    Saturated Fats 12 max. grams    Fruits and Vegetables 5 servings/day    Sodium 1.5 grams      Personal Nutrition Goals   Nutrition Goal ST: increase protein at meals LT: gain strength    Comments Pt reports loving eggs and cereal (raisin bran) - whole milk or waffles or pancake. L: sandwich (tomato right now) (white bread) or corn on the cobb or some beans (pintos) D: pork and beans. Discussed Pulmonary Nutrition, heart healthy nutrition, and high protein nutrition. His COPD is newly diagnosed and he is in remission of cancer - treatments ended in May. Pt reports not getting strength back.      Intervention Plan   Intervention Prescribe, educate and counsel regarding individualized specific dietary modifications aiming towards targeted core components such as weight, hypertension, lipid management, diabetes, heart failure and other comorbidities.;Nutrition handout(s) given to patient.    Expected Outcomes  Short Term Goal: Understand basic principles of dietary content, such as calories, fat, sodium, cholesterol and nutrients.;Long Term Goal: Adherence to prescribed nutrition plan.;Short Term Goal: A plan has been developed with personal nutrition goals set during dietitian appointment.           Nutrition Discharge:  Nutrition Assessments - 07/21/20 1526      MEDFICTS Scores   Pre Score 52           Education Questionnaire Score:  Knowledge Questionnaire Score - 07/21/20 1523      Knowledge Questionnaire Score   Pre Score 21/26 heart rate nutrition           Goals reviewed with patient; copy given to patient.

## 2020-10-09 NOTE — Progress Notes (Signed)
Daily Session Note  Patient Details  Name: Kristopher Fernandez MRN: [id number] Date of Birth: 06-Sep-1932 Referring Provider:     Cardiac Rehab from 07/21/2020 in [facility] Cardiac and Pulmonary Rehab  Referring Provider Aundra Dubin      Encounter Date: 10/09/2020  Check In:  Session Check In - 10/09/20 1340      Check-In   Supervising physician immediately available to respond to emergencies See telemetry face sheet for immediately available ER MD    Location [facility]-Cardiac & Pulmonary Rehab    Staff Present Renita Papa, RN BSN;Joseph [address] [place], [place], RCEP, CCRP, CCET    Virtual Visit No    Medication changes reported     No    Fall or balance concerns reported    No    Warm-up and Cool-down Performed on first and last piece of equipment    Resistance Training Performed Yes    VAD Patient? No    PAD/SET Patient? No      Pain Assessment   Currently in Pain? No/denies              Social History   Tobacco Use  Smoking Status Former Smoker   Years: 10.00   Types: Cigarettes   Quit date: 1972   Years since quitting: 49.8  Smokeless Tobacco Former Systems developer   Types: Chew  Tobacco Comment   Quit 1977    Goals Met:  Independence with exercise equipment Exercise tolerated well No report of cardiac concerns or symptoms Strength training completed today  Goals Unmet:  Not Applicable  Comments: Pt able to follow exercise prescription today without complaint.  Will continue to monitor for progression.    Dr. Emily Filbert is Medical Director for [place] and [facility].

## 2020-10-13 ENCOUNTER — Other Ambulatory Visit: Payer: Self-pay

## 2020-10-13 DIAGNOSIS — Z952 Presence of prosthetic heart valve: Secondary | ICD-10-CM

## 2020-10-13 NOTE — Progress Notes (Signed)
Cardiac Individual Treatment Plan  Patient Details  Name: AUGUSTINO SAVASTANO MRN: [id number] Date of Birth: 1932-02-05 Referring Provider:     Cardiac Rehab from 07/21/2020 in [facility] Cardiac and Pulmonary Rehab  Referring Provider Aundra Dubin      Initial Encounter Date:    Cardiac Rehab from 07/21/2020 in [facility] Cardiac and Pulmonary Rehab  Date 07/21/20      Visit Diagnosis: S/P TAVR (transcatheter aortic valve replacement)  Patient's Home Medications on Admission:  Current Outpatient Medications:  .  albuterol (VENTOLIN HFA) 108 (90 Base) MCG/ACT inhaler, Inhale 1-2 puffs into the lungs every 6 (six) hours as needed for wheezing or shortness of breath., Disp: 1 each, Rfl: 0 .  amiodarone (PACERONE) 200 MG tablet, TAKE 2 TABLETS BY MOUTH TWICE A DAY FOR 2 WEEKS, THEN TAKE 1 TABLET BY MOUTH TWICE DAILY FOR 2 WEEKS, Disp: 90 tablet, Rfl: 0 .  aspirin 81 MG chewable tablet, Chew 1 tablet (81 mg total) by mouth daily., Disp: , Rfl:  .  atorvastatin (LIPITOR) 20 MG tablet, Take 1 tablet (20 mg total) by mouth daily., Disp: 90 tablet, Rfl: 3 .  carvedilol (COREG) 6.25 MG tablet, TAKE 1 TABLET BY MOUTH 2 TIMES DAILY WITH A MEAL, Disp: 60 tablet, Rfl: 5 .  clopidogrel (PLAVIX) 75 MG tablet, Take 1 tablet (75 mg total) by mouth daily., Disp: 90 tablet, Rfl: 1 .  furosemide (LASIX) 20 MG tablet, Take 1 tablet (20 mg total) by mouth as needed (weight. Take one tablet if weight increases by 3 pounds overnight or 5 pounds in one week.)., Disp: 30 tablet, Rfl: 0 .  ipratropium-albuterol (DUONEB) 0.5-2.5 (3) MG/3ML SOLN, Take 3 mLs by nebulization every 6 (six) hours as needed., Disp: , Rfl:  .  losartan (COZAAR) 25 MG tablet, Take 0.5 tablets (12.5 mg total) by mouth daily., Disp: 15 tablet, Rfl: 3 .  spironolactone (ALDACTONE) 25 MG tablet, TAKE 1 TABLET BY MOUTH EVERYDAY AT BEDTIME, Disp: 90 tablet, Rfl: 3 .  tiotropium (SPIRIVA HANDIHALER) 18 MCG inhalation capsule, Place 1 capsule (18 mcg total) into inhaler and  inhale daily., Disp: 30 capsule, Rfl: 0 .  venlafaxine (EFFEXOR) 75 MG tablet, Take 75 mg by mouth as needed., Disp: , Rfl:   Past Medical History: Past Medical History:  Diagnosis Date  . Anginal pain ([place])   . Aortic stenosis   . Aortic stenosis   . Basal cell carcinoma, ear 03/31/2015  . Bradycardia 07/03/2014  . CHF (congestive heart failure) ([place])   . DDD (degenerative disc disease), lumbar 08/06/2015  . H/O cardiac catheterization 07/03/2014   Overview:  Cypher stent proximal and distal RCA 07/18/05 and TAXUS stent mid LAD 07/20/05 at [facility]  . HTN (hypertension) 07/03/2014  . Hyperlipidemia 07/03/2014  . Ischemic cardiomyopathy   . Lumbosacral radiculopathy at S1 08/22/2015  . MI (myocardial infarction) ([place]) 07/03/2014   Overview:  Mi 07/17/05  . Myocardial infarction ([place])   . Normocytic normochromic anemia 09/14/2015  . Pacemaker 07/03/2014   Overview:  Dual chamber pacemaker 08/11/11  . Pancreatic mass    not hypermetabolic on PET-CT imaging  . Prostate cancer ([place])    metastatic  . 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 Shortness of breath dyspnea   . SOB (shortness of breath) on exertion 05/13/2016    Tobacco Use: Social History   Tobacco Use  Smoking Status Former Smoker  . Years: 10.00  . Types: Cigarettes  . Quit date: 3  . Years since quitting: 49.8  Smokeless Tobacco Former Systems developer  . Types: Chew  Tobacco Comment   Quit 1977    Labs: Recent Review Flowsheet Data    Labs for ITP Cardiac and Pulmonary Rehab Latest Ref Rng & Units 05/22/2020 06/13/2020 06/17/2020 06/17/2020 06/17/2020   Cholestrol 0 - 200 mg/dL - - - - -   LDLCALC 0 - 99 mg/dL - - - - -   HDL >40 mg/dL - - - - -   Trlycerides <150 mg/dL - - - - -   Hemoglobin A1c 4.8 - 5.6 % - 5.6 - - -   PHART 7.35 - 7.45 - 7.425 7.324(L) - -   PCO2ART 32 - 48 mmHg - 37.7 53.1(H) - -   HCO3 20.0 -  28.0 mmol/L 24.7 24.3 27.6 - -   TCO2 22 - 32 mmol/L 26 - $Re'29 28 22   'uof$ ACIDBASEDEF 0.0 - 2.0 mmol/L 1.0 - - - -   O2SAT % 64.0 94.3 99.0 - -       Exercise Target Goals: Exercise Program Goal: Individual exercise prescription set using results from initial 6 min walk test and THRR while considering  patient's activity barriers and safety.   Exercise Prescription Goal: Initial exercise prescription builds to 30-45 minutes a day of aerobic activity, 2-3 days per week.  Home exercise guidelines will be given to patient during program as part of exercise prescription that the participant will acknowledge.   Education: Aerobic Exercise & Resistance Training: - Gives group verbal and written instruction on the various components of exercise. Focuses on aerobic and resistive training programs and the benefits of this training and how to safely progress through these programs..   Education: Exercise & Equipment Safety: - Individual verbal instruction and demonstration of equipment use and safety with use of the equipment.   Cardiac Rehab from 07/21/2020 in [facility] Cardiac and Pulmonary Rehab  Date 07/21/20  Educator AS  Instruction Review Code 1- Verbalizes Understanding      Education: Exercise Physiology & General Exercise Guidelines: - Group verbal and written instruction with models to review the exercise physiology of the cardiovascular system and associated critical values. Provides general exercise guidelines with specific guidelines to those with heart or lung disease.    Education: Flexibility, Balance, Mind/Body Relaxation: Provides group verbal/written instruction on the benefits of flexibility and balance training, including mind/body exercise modes such as yoga, pilates and tai chi.  Demonstration and skill practice provided.   Activity Barriers & Risk Stratification:  Activity Barriers & Cardiac Risk Stratification - 07/17/20 1506      Activity Barriers & Cardiac Risk  Stratification   Activity Barriers Shortness of Breath;Deconditioning   Uses a cane   Cardiac Risk Stratification Low           6 Minute Walk:  6 Minute Walk    Row Name 07/21/20 1505 10/01/20 1518       6 Minute Walk   Phase Initial Discharge    Distance 655 feet 725 feet    Distance % Change -- 10.68 %    Distance Feet Change -- 70 ft    Walk Time 6 minutes 6 minutes    # of Rest Breaks 0 0    MPH 1.24 1.37    METS 1 1.18    RPE 13 13    Perceived Dyspnea  2 3  VO2 Peak 3.5 4.16    Symptoms No Yes (comment)    Comments -- SOB, resolved with rest    Resting HR 77 bpm 73 bpm    Resting BP 112/66 122/62    Resting Oxygen Saturation  94 % 92 %    Exercise Oxygen Saturation  during 6 min walk 88 % 90 %    Max Ex. HR 91 bpm 98 bpm    Max Ex. BP 138/66 136/64    2 Minute Post BP 118/68 116/62           Oxygen Initial Assessment:   Oxygen Re-Evaluation:   Oxygen Discharge (Final Oxygen Re-Evaluation):   Initial Exercise Prescription:  Initial Exercise Prescription - 07/21/20 1500      Date of Initial Exercise RX and Referring Provider   Date 07/21/20    Referring Provider Aundra Dubin      Treadmill   MPH 1    Grade 0    Minutes 15    METs 1.77      Recumbant Bike   Level 1    RPM 60    Minutes 15    METs 1      NuStep   Level 1    SPM 80    Minutes 15    METs 1      Prescription Details   Frequency (times per week) 3    Duration Progress to 30 minutes of continuous aerobic without signs/symptoms of physical distress      Intensity   THRR 40-80% of Max Heartrate 100-123    Ratings of Perceived Exertion 11-13    Perceived Dyspnea 0-4      Resistance Training   Training Prescription Yes    Weight 3 lb    Reps 10-15           Perform Capillary Blood Glucose checks as needed.  Exercise Prescription Changes:   Exercise Prescription Changes    Row Name 07/21/20 1500 07/30/20 1400 08/13/20 1400 08/26/20 1400 09/11/20 0800     Response  to Exercise   Blood Pressure (Admit) 112/66 108/66 108/64 126/62 126/58   Blood Pressure (Exercise) 138/66 128/70 126/74 124/64 130/66   Blood Pressure (Exit) 118/68 122/74 114/64 134/60 120/60   Heart Rate (Admit) 77 bpm 59 bpm 77 bpm 71 bpm 79 bpm   Heart Rate (Exercise) 91 bpm 95 bpm 98 bpm 98 bpm 96 bpm   Heart Rate (Exit) 82 bpm 103 bpm 94 bpm 93 bpm 83 bpm   Oxygen Saturation (Admit) 94 % -- -- -- --   Oxygen Saturation (Exercise) 88 % -- -- -- --   Oxygen Saturation (Exit) 94 % -- -- -- --   Rating of Perceived Exertion (Exercise) 13 13 10 13 13    Perceived Dyspnea (Exercise) 2 -- -- -- --   Symptoms none none none none none   Duration -- Continue with 30 min of aerobic exercise without signs/symptoms of physical distress. Continue with 30 min of aerobic exercise without signs/symptoms of physical distress. Continue with 30 min of aerobic exercise without signs/symptoms of physical distress. Continue with 30 min of aerobic exercise without signs/symptoms of physical distress.   Intensity -- THRR unchanged THRR unchanged THRR unchanged THRR unchanged     Progression   Progression -- Continue to progress workloads to maintain intensity without signs/symptoms of physical distress. Continue to progress workloads to maintain intensity without signs/symptoms of physical distress. Continue to progress workloads to maintain intensity without signs/symptoms of physical  distress. Continue to progress workloads to maintain intensity without signs/symptoms of physical distress.   Average METs -- 1.65 2.37 2.5 2.43     Resistance Training   Training Prescription -- Yes Yes Yes Yes   Weight -- 3 lb 4 lb 4 lb 4 lb   Reps -- 10-15 10-15 10-15 10-15     Interval Training   Interval Training -- -- No No No     Treadmill   MPH -- 1.9 1.9 -- 1.2   Grade -- 0 0 -- 4.5   Minutes -- 15 15 -- 15   METs -- 2.45 2.45 -- 2.66     Recumbant Bike   Level -- -- 1.$Remov'1 1 1   'jCMetc$ RPM -- -- -- 60 --   Minutes --  -- $R'15 15 15   '$ METs -- -- -- 2.6 --     NuStep   Level -- $Remove'1 3 3 3   'UELojje$ SPM -- 80 -- 80 --   Minutes -- $RemoveBe'15 15 15 15   'oMlrQYRtm$ METs -- 1.7 2.3 2.4 2.2     Home Exercise Plan   Plans to continue exercise at -- -- Home (comment)  walking, leg PT exercises Home (comment)  walking, leg PT exercises Home (comment)  walking, leg PT exercises   Frequency -- -- Add 3 additional days to program exercise sessions. Add 3 additional days to program exercise sessions. Add 3 additional days to program exercise sessions.   Initial Home Exercises Provided -- -- 08/07/20 08/07/20 08/07/20   Row Name 09/23/20 1300 10/07/20 1500           Response to Exercise   Blood Pressure (Admit) 106/58 100/80      Blood Pressure (Exercise) 120/58 110/62      Blood Pressure (Exit) 108/56 112/62      Heart Rate (Admit) 77 bpm 77 bpm      Heart Rate (Exercise) 97 bpm 98 bpm      Heart Rate (Exit) 78 bpm 80 bpm      Rating of Perceived Exertion (Exercise) 14 14      Symptoms none none      Duration Continue with 30 min of aerobic exercise without signs/symptoms of physical distress. Continue with 30 min of aerobic exercise without signs/symptoms of physical distress.      Intensity THRR unchanged THRR unchanged        Progression   Progression Continue to progress workloads to maintain intensity without signs/symptoms of physical distress. Continue to progress workloads to maintain intensity without signs/symptoms of physical distress.      Average METs 2.2 2.25        Resistance Training   Training Prescription Yes Yes      Weight 4 lb 4 lb      Reps 10-15 10-15        Interval Training   Interval Training No No        Treadmill   MPH 1.3 1.3      Grade 0 0      Minutes 15 15      METs 2 2        NuStep   Level 3 4      SPM 80 --      Minutes 15 15      METs 2.3 2.5        Home Exercise Plan   Plans to continue exercise at Home (comment)  walking, leg PT exercises Home (comment)  walking,  leg PT exercises       Frequency Add 3 additional days to program exercise sessions. Add 3 additional days to program exercise sessions.      Initial Home Exercises Provided 08/07/20 08/07/20             Exercise Comments:   Exercise Goals and Review:   Exercise Goals    Row Name 07/21/20 1516             Exercise Goals   Increase Physical Activity Yes       Intervention Provide advice, education, support and counseling about physical activity/exercise needs.;Develop an individualized exercise prescription for aerobic and resistive training based on initial evaluation findings, risk stratification, comorbidities and participant's personal goals.       Expected Outcomes Short Term: Attend rehab on a regular basis to increase amount of physical activity.;Long Term: Add in home exercise to make exercise part of routine and to increase amount of physical activity.;Long Term: Exercising regularly at least 3-5 days a week.       Increase Strength and Stamina Yes       Intervention Provide advice, education, support and counseling about physical activity/exercise needs.;Develop an individualized exercise prescription for aerobic and resistive training based on initial evaluation findings, risk stratification, comorbidities and participant's personal goals.       Expected Outcomes Short Term: Increase workloads from initial exercise prescription for resistance, speed, and METs.;Short Term: Perform resistance training exercises routinely during rehab and add in resistance training at home;Long Term: Improve cardiorespiratory fitness, muscular endurance and strength as measured by increased METs and functional capacity (6MWT)       Able to understand and use rate of perceived exertion (RPE) scale Yes       Intervention Provide education and explanation on how to use RPE scale       Expected Outcomes Short Term: Able to use RPE daily in rehab to express subjective intensity level;Long Term:  Able to use RPE to guide  intensity level when exercising independently       Able to understand and use Dyspnea scale Yes       Intervention Provide education and explanation on how to use Dyspnea scale       Expected Outcomes Short Term: Able to use Dyspnea scale daily in rehab to express subjective sense of shortness of breath during exertion;Long Term: Able to use Dyspnea scale to guide intensity level when exercising independently       Knowledge and understanding of Target Heart Rate Range (THRR) Yes       Intervention Provide education and explanation of THRR including how the numbers were predicted and where they are located for reference       Expected Outcomes Short Term: Able to state/look up THRR;Short Term: Able to use daily as guideline for intensity in rehab;Long Term: Able to use THRR to govern intensity when exercising independently       Able to check pulse independently Yes       Intervention Provide education and demonstration on how to check pulse in carotid and radial arteries.;Review the importance of being able to check your own pulse for safety during independent exercise       Expected Outcomes Short Term: Able to explain why pulse checking is important during independent exercise;Long Term: Able to check pulse independently and accurately       Understanding of Exercise Prescription Yes       Intervention Provide education, explanation, and written materials  on patient's individual exercise prescription       Expected Outcomes Short Term: Able to explain program exercise prescription;Long Term: Able to explain home exercise prescription to exercise independently              Exercise Goals Re-Evaluation :  Exercise Goals Re-Evaluation    Row Name 07/23/20 1341 07/30/20 1438 08/07/20 1347 08/13/20 1440 08/26/20 1500     Exercise Goal Re-Evaluation   Exercise Goals Review Increase Physical Activity;Able to understand and use rate of perceived exertion (RPE) scale;Knowledge and understanding of  Target Heart Rate Range (THRR);Understanding of Exercise Prescription;Increase Strength and Stamina;Able to check pulse independently Increase Physical Activity;Able to understand and use rate of perceived exertion (RPE) scale;Knowledge and understanding of Target Heart Rate Range (THRR);Understanding of Exercise Prescription;Increase Strength and Stamina;Able to check pulse independently Increase Physical Activity;Increase Strength and Stamina;Understanding of Exercise Prescription Increase Physical Activity;Increase Strength and Stamina;Understanding of Exercise Prescription Increase Physical Activity;Increase Strength and Stamina;Understanding of Exercise Prescription   Comments Reviewed RPE and dyspnea scales, THR and program prescription with pt today.  Pt voiced understanding and was given a copy of goals to take home. Marven has tolerated exercise well in his first week of attendance. Staff will monitor progress. Halvor is doing well in rehab.  He has already started to notice an increase in his lower body strength.  He is following his routine for his legs strength.  His biggest limitation continues to be his breathing. He is using his PLB to help.  Reviewed home exercise with pt today.  Pt plans to walk and continue with leg exercises at home for exercise.  Reviewed THR, pulse, RPE, sign and symptoms, pulse oximetery and when to call 911 or MD.  Also discussed weather considerations and indoor options.  Pt voiced understanding. Elven continues to do well in rehab.  He is up to 4 lb handweights.  We will continue to monitor his progress. Mark is progressing well and is up to level 3 on NS.  He attends consistently and works at correct RPE.   Expected Outcomes Short: Use RPE daily to regulate intensity. Long: Follow program prescription in THR. Short:  attend consistently Long: improve overall stamina Short: Start to add in walking at home Long: Continue to improve stamina. Short: Continue to attend  consistently Long: Continue to walk at home on off days. Short: Continue to attend consistently Long: increase overall MET level   Row Name 09/08/20 1349 09/11/20 0836 09/23/20 1318 09/25/20 1358 10/07/20 1502     Exercise Goal Re-Evaluation   Exercise Goals Review Increase Physical Activity;Increase Strength and Stamina;Understanding of Exercise Prescription Increase Physical Activity;Increase Strength and Stamina;Understanding of Exercise Prescription Increase Physical Activity;Increase Strength and Stamina;Understanding of Exercise Prescription Increase Physical Activity;Increase Strength and Stamina;Understanding of Exercise Prescription Increase Physical Activity;Increase Strength and Stamina;Understanding of Exercise Prescription   Comments Makani is doing some exercise at home. 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 States he does take his HR at home and was reminded to use RPE scale at home to gauge intensity and how hard he is working. Continues to do leg exercises at home. SOB at home has subsided but still uses PLB technique when needed. Paydon continues to do well in rehab.  He is up to 4.5% grade on the treadmill.  We will continue to monitor his progress. Tirth attends consistently and works at correct RPE.  He uses 4 lb weights.  Staff willfollow up to see if he woudl like to try 5 lb Montie is due for his post 6MWT next week.  He has done well and we expect to see him improve. He is planning to walk and continue his routine with his leg exercises. Jkai improved his post walk by 10%! He graduates next week! He is planning to continue to exercise by walking at home.   Expected Outcomes Short: Use RPE at home during exercise Long: Be able to exercise independently at home without any complications Short: Increase NuStep to level 4 Long: Continue to improve stamina. Short: increase to 5 lb Long: increase MET level overall Short:  Improve post 6MWT  Long: Continue to exercise independently Continue to exercise indpendently          Discharge Exercise Prescription (Final Exercise Prescription Changes):  Exercise Prescription Changes - 10/07/20 1500      Response to Exercise   Blood Pressure (Admit) 100/80    Blood Pressure (Exercise) 110/62    Blood Pressure (Exit) 112/62    Heart Rate (Admit) 77 bpm    Heart Rate (Exercise) 98 bpm    Heart Rate (Exit) 80 bpm    Rating of Perceived Exertion (Exercise) 14    Symptoms none    Duration Continue with 30 min of aerobic exercise without signs/symptoms of physical distress.    Intensity THRR unchanged      Progression   Progression Continue to progress workloads to maintain intensity without signs/symptoms of physical distress.    Average METs 2.25      Resistance Training   Training Prescription Yes    Weight 4 lb    Reps 10-15      Interval Training   Interval Training No      Treadmill   MPH 1.3    Grade 0    Minutes 15    METs 2      NuStep   Level 4    Minutes 15    METs 2.5      Home Exercise Plan   Plans to continue exercise at Home (comment)   walking, leg PT exercises   Frequency Add 3 additional days to program exercise sessions.    Initial Home Exercises Provided 08/07/20           Nutrition:  Target Goals: Understanding of nutrition guidelines, daily intake of sodium '1500mg'$ , cholesterol '200mg'$ , calories 30% from fat and 7% or less from saturated fats, daily to have 5 or more servings of fruits and vegetables.  Education: Controlling Sodium/Reading Food Labels -Group verbal and written material supporting the discussion of sodium use in heart healthy nutrition. Review and explanation with models, verbal and written materials for utilization of the food label.   Education: General Nutrition Guidelines/Fats and Fiber: -Group instruction provided by verbal, written material, models and posters to present the general guidelines for  heart healthy nutrition. Gives an explanation and review of dietary fats and fiber.   Biometrics:    Nutrition Therapy Plan and Nutrition Goals:  Nutrition Therapy & Goals - 07/21/20 1403      Nutrition Therapy   Diet High calorie, High protein pulmonary MNT diet.    Protein (specify units) 80-85g    Fiber 30 grams    Whole Grain Foods 3 servings    Saturated Fats 12 max. grams    Fruits and Vegetables 5 servings/day    Sodium 1.5 grams      Personal Nutrition Goals   Nutrition Goal  ST: increase protein at meals LT: gain strength    Comments Pt reports loving eggs and cereal (raisin bran) - whole milk or waffles or pancake. L: sandwich (tomato right now) (white bread) or corn on the cobb or some beans (pintos) D: pork and beans. Discussed Pulmonary Nutrition, heart healthy nutrition, and high protein nutrition. His COPD is newly diagnosed and he is in remission of cancer - treatments ended in May. Pt reports not getting strength back.      Intervention Plan   Intervention Prescribe, educate and counsel regarding individualized specific dietary modifications aiming towards targeted core components such as weight, hypertension, lipid management, diabetes, heart failure and other comorbidities.;Nutrition handout(s) given to patient.    Expected Outcomes Short Term Goal: Understand basic principles of dietary content, such as calories, fat, sodium, cholesterol and nutrients.;Long Term Goal: Adherence to prescribed nutrition plan.;Short Term Goal: A plan has been developed with personal nutrition goals set during dietitian appointment.           Nutrition Assessments:  Nutrition Assessments - 07/21/20 1526      MEDFICTS Scores   Pre Score 52           MEDIFICTS Score Key:          ?70 Need to make dietary changes          40-70 Heart Healthy Diet         ? 40 Therapeutic Level Cholesterol Diet  Nutrition Goals Re-Evaluation:  Nutrition Goals Re-Evaluation    [place] Name  08/20/20 1343 09/01/20 1344 09/25/20 1402         Goals   Nutrition Goal ST: increase protein at meals LT: gain strength ST: increase protein at meals LT: gain strength Increase protein and maintain changes     Comment continue with current changes. Pt is currently grieving the loss of his neighbor. Pt reports his strength is increases. Has been increasing his protein such as eggs. Continue with current changes. Kyran has been working on increasing his protein and sticking with his changes     Expected Outcome ST: increase protein at meals LT: gain strength ST: increase protein at meals LT: gain strength Continue to eat heart healthy.            Nutrition Goals Discharge (Final Nutrition Goals Re-Evaluation):  Nutrition Goals Re-Evaluation - 09/25/20 1402      Goals   Nutrition Goal Increase protein and maintain changes    Comment Daltin has been working on increasing his protein and sticking with his changes    Expected Outcome Continue to eat heart healthy.           Psychosocial: Target Goals: Acknowledge presence or absence of significant depression and/or stress, maximize coping skills, provide positive support system. Participant is able to verbalize types and ability to use techniques and skills needed for reducing stress and depression.   Education: Depression - Provides group verbal and written instruction on the correlation between heart/lung disease and depressed mood, treatment options, and the stigmas associated with seeking treatment.   Education: Sleep Hygiene -Provides group verbal and written instruction about how sleep can affect your health.  Define sleep hygiene, discuss sleep cycles and impact of sleep habits. Review good sleep hygiene tips.     Education: Stress and Anxiety: - Provides group verbal and written instruction about the health risks of elevated stress and causes of high stress.  Discuss the correlation between heart/lung disease and anxiety and  treatment options. Review healthy  ways to manage with stress and anxiety.    Initial Review & Psychosocial Screening:  Initial Psych Review & Screening - 07/17/20 1512      Initial Review   Current issues with Current Stress Concerns    Source of Stress Concerns Chronic Illness    Comments Just finished cancer treament May   still weak SOB,  then TAVR    wants to feel stronger   improve SOB      Family Dynamics   Good Support System? Yes   Daughter  [name]   Concerns Recent loss of significant other    Comments Wife of almost 23 years passed away in 03-26-2019      Barriers   Psychosocial barriers to participate in program There are no identifiable barriers or psychosocial needs.      Screening Interventions   Interventions Encouraged to exercise;Provide feedback about the scores to participant;To provide support and resources with identified psychosocial needs    Expected Outcomes Short Term goal: Utilizing psychosocial counselor, staff and physician to assist with identification of specific Stressors or current issues interfering with healing process. Setting desired goal for each stressor or current issue identified.;Long Term Goal: Stressors or current issues are controlled or eliminated.;Short Term goal: Identification and review with participant of any Quality of Life or Depression concerns found by scoring the questionnaire.;Long Term goal: The participant improves quality of Life and PHQ9 Scores as seen by post scores and/or verbalization of changes           Quality of Life Scores:   Quality of Life - 10/13/20 1413      Quality of Life   Select Quality of Life      Quality of Life Scores   Health/Function Pre 26.14 %    Health/Function Post 19.63 %    Health/Function % Change -24.9 %    Socioeconomic Pre 30 %    Socioeconomic Post 30 %    Socioeconomic % Change  0 %    Psych/Spiritual Pre 28.29 %    Psych/Spiritual Post 22.14 %    Psych/Spiritual % Change -21.74 %     Family Pre 30 %    Family Post 27.6 %    Family % Change -8 %    GLOBAL Pre 28 %    GLOBAL Post 23.64 %    GLOBAL % Change -15.57 %          Scores of 19 and below usually indicate a poorer quality of life in these areas.  A difference of  2-3 points is a clinically meaningful difference.  A difference of 2-3 points in the total score of the Quality of Life Index has been associated with significant improvement in overall quality of life, self-image, physical symptoms, and general health in studies assessing change in quality of life.  PHQ-9: Recent Review Flowsheet Data    Depression screen [facility] 2/9 10/13/2020 08/20/2020 07/21/2020 07/11/2020   Decreased Interest 3 0 0 0   Down, Depressed, Hopeless 0 0 0 0   PHQ - 2 Score 3 0 0 0   Altered sleeping $RemoveBeforeDE'3 1 3  'YwkvXixKIsoUdRn$ -   Tired, decreased energy $RemoveBeforeDE'3 2 3  'exhSIscclRqWBnS$ -   Change in appetite 2 1 0 -   Feeling bad or failure about yourself  3 0 0 -   Trouble concentrating 2 0 0 -   Moving slowly or fidgety/restless 0 1 0 -   Suicidal thoughts - 0 0 -   PHQ-9 Score  $'16 5 6 'k$ -   Difficult doing work/chores Somewhat difficult  Not difficult at all - -     Interpretation of Total Score  Total Score Depression Severity:  1-4 = Minimal depression, 5-9 = Mild depression, 10-14 = Moderate depression, 15-19 = Moderately severe depression, 20-27 = Severe depression   Psychosocial Evaluation and Intervention:  Psychosocial Evaluation - 09/25/20 1400      Discharge Psychosocial Assessment & Intervention   Comments Wilman has done well in rehab.  He is set to graduate toward the end of the month.  He has enjoyed getting out and getting to know the staff.  He has enjoyed exercsies and noticed that he is feeling stronger than when he started.           Psychosocial Re-Evaluation:  Psychosocial Re-Evaluation    [place] Name 08/07/20 1352 09/08/20 1406           Psychosocial Re-Evaluation   Current issues with Current Stress Concerns Current Stress Concerns      Comments  Jaris's neighbor just passed away this week and was a close friend.  He is helping the daughters plan the funeral.  He enjoys spending time with his kids and grandkids.  He is sleeping well, but his biggest concern is his breathing .  He often feels short of breath with activity, he does feel when he using PLB. Tavarius completed his PHQ a couple weeks ago and improved by 1 point for a total of 5. He continues to hang out with his grandchildren which is helping  his social life and maintain a positive attitude.  His sleep pattern has been off and on- he is currently seeing someone new in his relationship. Encouraged to speak with doctor regarding sleep as he states he is on medication and feels it is not helping      Expected Outcomes Short: Cope with loss of friend  Long: Continue to focus on the positive. Short: Speak to doctor regarding sleep medications Long: Continue to maintain positive attitude/utilize exercise for stress management      Continue Psychosocial Services  -- Follow up required by staff             Psychosocial Discharge (Final Psychosocial Re-Evaluation):  Psychosocial Re-Evaluation - 09/08/20 1406      Psychosocial Re-Evaluation   Current issues with Current Stress Concerns    Comments Kyan completed his PHQ a couple weeks ago and improved by 1 point for a total of 5. He continues to hang out with his grandchildren which is helping  his social life and maintain a positive attitude.  His sleep pattern has been off and on- he is currently seeing someone new in his relationship. Encouraged to speak with doctor regarding sleep as he states he is on medication and feels it is not helping    Expected Outcomes Short: Speak to doctor regarding sleep medications Long: Continue to maintain positive attitude/utilize exercise for stress management    Continue Psychosocial Services  Follow up required by staff           Vocational Rehabilitation: Provide vocational rehab assistance to  qualifying candidates.   Vocational Rehab Evaluation & Intervention:  Vocational Rehab - 07/17/20 1514      Initial Vocational Rehab Evaluation & Intervention   Assessment shows need for Vocational Rehabilitation No           Education: Education Goals: Education classes will be provided on a variety of topics geared toward better understanding of  heart health and risk factor modification. Participant will state understanding/return demonstration of topics presented as noted by education test scores.  Learning Barriers/Preferences:  Learning Barriers/Preferences - 07/17/20 1519      Learning Barriers/Preferences   Learning Barriers None    Learning Preferences None           General Cardiac Education Topics:  AED/CPR: - Group verbal and written instruction with the use of models to demonstrate the basic use of the AED with the basic ABC's of resuscitation.   Anatomy & Physiology of the Heart: - Group verbal and written instruction and models provide basic cardiac anatomy and physiology, with the coronary electrical and arterial systems. Review of Valvular disease and Heart Failure   Cardiac Procedures: - Group verbal and written instruction to review commonly prescribed medications for heart disease. Reviews the medication, class of the drug, and side effects. Includes the steps to properly store meds and maintain the prescription regimen. (beta blockers and nitrates)   Cardiac Medications I: - Group verbal and written instruction to review commonly prescribed medications for heart disease. Reviews the medication, class of the drug, and side effects. Includes the steps to properly store meds and maintain the prescription regimen.   Cardiac Medications II: -Group verbal and written instruction to review commonly prescribed medications for heart disease. Reviews the medication, class of the drug, and side effects. (all other drug classes)    Go Sex-Intimacy & Heart  Disease, Get SMART - Goal Setting: - Group verbal and written instruction through game format to discuss heart disease and the return to sexual intimacy. Provides group verbal and written material to discuss and apply goal setting through the application of the S.M.A.R.T. Method.   Other Matters of the Heart: - Provides group verbal, written materials and models to describe Stable Angina and Peripheral Artery. Includes description of the disease process and treatment options available to the cardiac patient.   Infection Prevention: - Provides verbal and written material to individual with discussion of infection control including proper hand washing and proper equipment cleaning during exercise session.   Cardiac Rehab from 07/21/2020 in [facility] Cardiac and Pulmonary Re[facility]  Date 07/21/20  Educator AS  Instruction Review Code 1- Verbalizes Understanding      Falls Prevention: - Provides verbal and written material to individual with discussion of falls prevention and safety.   Cardiac Rehab from 07/21/2020 in [facility] Cardiac and Pulmonary Rehab  Date 07/21/20  Educator AS  Instruction Review Code 1- Verbalizes Understanding      Other: -Provides group and verbal instruction on various topics (see comments)   Knowledge Questionnaire Score:  Knowledge Questionnaire Score - 10/13/20 1349      Knowledge Questionnaire Score   Post Score 20/26           Core Components/Risk Factors/Patient Goals at Admission:  Personal Goals and Risk Factors at Admission - 07/17/20 1509      Core Components/Risk Factors/Patient Goals on Admission    Weight Management Yes;Weight Maintenance    Intervention Weight Management: Develop a combined nutrition and exercise program designed to reach desired caloric intake, while maintaining appropriate intake of nutrient and fiber, sodium and fats, and appropriate energy expenditure required for the weight goal.;Weight Management: Provide education and appropriate  resources to help participant work on and attain dietary goals.    Admit Weight 182 lb (82.6 kg)    Goal Weight: Short Term 182 lb (82.6 kg)    Goal Weight: Long Term 182 lb (82.6  kg)    Expected Outcomes Short Term: Continue to assess and modify interventions until short term weight is achieved;Long Term: Adherence to nutrition and physical activity/exercise program aimed toward attainment of established weight goal;Weight Maintenance: Understanding of the daily nutrition guidelines, which includes 25-35% calories from fat, 7% or less cal from saturated fats, less than $RemoveB'200mg'muPiGKJi$  cholesterol, less than 1.5gm of sodium, & 5 or more servings of fruits and vegetables daily    Hypertension Yes    Intervention Provide education on lifestyle modifcations including regular physical activity/exercise, weight management, moderate sodium restriction and increased consumption of fresh fruit, vegetables, and low fat dairy, alcohol moderation, and smoking cessation.;Monitor prescription use compliance.    Expected Outcomes Short Term: Continued assessment and intervention until BP is < 140/35mm HG in hypertensive participants. < 130/35mm HG in hypertensive participants with diabetes, heart failure or chronic kidney disease.;Long Term: Maintenance of blood pressure at goal levels.    Lipids Yes    Intervention Provide education and support for participant on nutrition & aerobic/resistive exercise along with prescribed medications to achieve LDL '70mg'$ , HDL >$Remo'40mg'VIwLg$ .    Expected Outcomes Short Term: Participant states understanding of desired cholesterol values and is compliant with medications prescribed. Participant is following exercise prescription and nutrition guidelines.;Long Term: Cholesterol controlled with medications as prescribed, with individualized exercise RX and with personalized nutrition plan. Value goals: LDL < $Rem'70mg'jKEh$ , HDL > 40 mg.           Education:Diabetes - Individual verbal and written instruction  to review signs/symptoms of diabetes, desired ranges of glucose level fasting, after meals and with exercise. Acknowledge that pre and post exercise glucose checks will be done for 3 sessions at entry of program.   Education: Know Your Numbers and Risk Factors: -Group verbal and written instruction about important numbers in your health.  Discussion of what are risk factors and how they play a role in the disease process.  Review of Cholesterol, Blood Pressure, Diabetes, and BMI and the role they play in your overall health.   Core Components/Risk Factors/Patient Goals Review:   Goals and Risk Factor Review    Row Name 08/07/20 1349 09/08/20 1403 09/25/20 1403         Core Components/Risk Factors/Patient Goals Review   Personal Goals Review Weight Management/Obesity;Improve shortness of breath with ADL's;Hypertension;Lipids Weight Management/Obesity;Improve shortness of breath with ADL's;Hypertension;Lipids Weight Management/Obesity;Improve shortness of breath with ADL's;Hypertension;Lipids     Review Duward is doing well in rehab.  His weight is staying steady.  His breathing continues to be his biggest problem.  His pressures are doing well mostly.  Yesterday, it was low after class, but overall doing well.  He tries to check it at least twice a day. Sebert has been doing great. Weight has been staying steady and he weighs himself at home 2-3x/week, he also keeps a chart at home. BP at home have also been stable around 191-478G systolic. Reports it gets low sometimes. Encouraged to stay hydrated if his BP gets low at times. Knows to use PLB techniques when needed. Jobie has been able to maintain his weight and feeling good overall. His pressures have been good. His breathing has improved since he started rehab.  He is going to continue to monitor his pressures and use his PLB for his breathing.     Expected Outcomes Short: Continue to keep eye on blood pressure Long: Continue to monitor risk factors.  Short: Stayed hydrated to maintain stable BPs Long: Manage lifestyle risk factors independently  Short: Continue to maintain weight Long; continue to montior risk factors.            Core Components/Risk Factors/Patient Goals at Discharge (Final Review):   Goals and Risk Factor Review - 09/25/20 1403      Core Components/Risk Factors/Patient Goals Review   Personal Goals Review Weight Management/Obesity;Improve shortness of breath with ADL's;Hypertension;Lipids    Review Neel has been able to maintain his weight and feeling good overall. His pressures have been good. His breathing has improved since he started rehab.  He is going to continue to monitor his pressures and use his PLB for his breathing.    Expected Outcomes Short: Continue to maintain weight Long; continue to montior risk factors.           ITP Comments:  ITP Comments    Row Name 07/17/20 1523 07/21/20 1536 07/23/20 1340 07/30/20 0726 08/27/20 1634   ITP Comments Virtual orientation call completed today. he has an appointment on Date: 07/21/2020 for EP eval and gym Orientation.  Documentation of diagnosis can be found in [facility] Date: 06/17/2020. Completed 6MWT and gym orientation. Initial ITP created and sent for review to Dr. Emily Filbert, Medical Director. First full day of exercise!  Patient was oriented to gym and equipment including functions, settings, policies, and procedures.  Patient's individual exercise prescription and treatment plan were reviewed.  All starting workloads were established based on the results of the 6 minute walk test done at initial orientation visit.  The plan for exercise progression was also introduced and progression will be customized based on patient's performance and goals. 30 Day review completed. Medical Director ITP review done, changes made as directed, and signed approval by Medical Director. 30 day review completed. ITP sent to Dr. Emily Filbert, Medical Director of Cardiac and Pulmonary Rehab.  Continue with ITP unless changes are made by physician.   [place] Name 09/24/20 0628 10/13/20 1412         ITP Comments 30 Day review completed. Medical Director ITP review done, changes made as directed, and signed approval by Medical Director. Nayden graduated today from  rehab with 36 sessions completed.  Details of the patient's exercise prescription and what He needs to do in order to continue the prescription and progress were discussed with patient.  Patient was given a copy of prescription and goals.  Patient verbalized understanding.  Malyk plans to continue to exercise by walking at home.             Comments: Discharge ITP

## 2020-10-13 NOTE — Progress Notes (Signed)
Daily Session Note  Patient Details  Name: Kristopher Fernandez MRN: [id number] Date of Birth: [birth year] Referring Provider:     Cardiac Rehab from 07/21/2020 in [facility] Cardiac and Pulmonary Rehab  Referring Provider Aundra Dubin      Encounter Date: 10/13/2020  Check In:  Session Check In - 10/13/20 1410      Check-In   Supervising physician immediately available to respond to emergencies See telemetry face sheet for immediately available ER MD    Location [facility]-Cardiac & Pulmonary Rehab    Staff Present Renita Papa, RN Margurite Auerbach, MS Exercise Physiologist;Kelly Amedeo Plenty, BS, ACSM CEP, Exercise Physiologist;Kelly Rosalia Hammers, MPA, RN    Virtual Visit No    Medication changes reported     No    Fall or balance concerns reported    No    Warm-up and Cool-down Performed on first and last piece of equipment    Resistance Training Performed Yes    VAD Patient? No    PAD/SET Patient? No      Pain Assessment   Currently in Pain? No/denies              Social History   Tobacco Use  Smoking Status Former Smoker  . Years: 10.00  . Types: Cigarettes  . Quit date: 76  . Years since quitting: 49.8  Smokeless Tobacco Former Systems developer  . Types: Chew  Tobacco Comment   Quit 1977    Goals Met:  Independence with exercise equipment Exercise tolerated well No report of cardiac concerns or symptoms Strength training completed today  Goals Unmet:  Not Applicable  Comments:  Kristopher Fernandez graduated today from  rehab with 36 sessions completed.  Details of the patient's exercise prescription and what He needs to do in order to continue the prescription and progress were discussed with patient.  Patient was given a copy of prescription and goals.  Patient verbalized understanding.  Kristopher Fernandez plans to continue to exercise by walking at home.     Dr. Emily Filbert is Medical Director for [facility].

## 2020-10-13 NOTE — Progress Notes (Signed)
Discharge Progress Report  Patient Details  Name: Kristopher Fernandez MRN: [id number] Date of Birth: Aug 10, 1932 Referring Provider:     Cardiac Rehab from 07/21/2020 in [facility] Cardiac and Pulmonary Rehab  Referring Provider Shirlee Latch       Number of Visits: 34  Reason for Discharge:  Patient reached a stable level of exercise. Patient independent in their exercise. Patient has met program and personal goals.  Smoking History:  Social History   Tobacco Use  Smoking Status Former Smoker  . Years: 10.00  . Types: Cigarettes  . Quit date: 9  . Years since quitting: 49.8  Smokeless Tobacco Former Neurosurgeon  . Types: Chew  Tobacco Comment   Quit 1977    Diagnosis:  S/P TAVR (transcatheter aortic valve replacement)  ADL UCSD:   Initial Exercise Prescription:  Initial Exercise Prescription - 07/21/20 1500      Date of Initial Exercise RX and Referring Provider   Date 07/21/20    Referring Provider Shirlee Latch      Treadmill   MPH 1    Grade 0    Minutes 15    METs 1.77      Recumbant Bike   Level 1    RPM 60    Minutes 15    METs 1      NuStep   Level 1    SPM 80    Minutes 15    METs 1      Prescription Details   Frequency (times per week) 3    Duration Progress to 30 minutes of continuous aerobic without signs/symptoms of physical distress      Intensity   THRR 40-80% of Max Heartrate 100-123    Ratings of Perceived Exertion 11-13    Perceived Dyspnea 0-4      Resistance Training   Training Prescription Yes    Weight 3 lb    Reps 10-15           Discharge Exercise Prescription (Final Exercise Prescription Changes):  Exercise Prescription Changes - 10/07/20 1500      Response to Exercise   Blood Pressure (Admit) 100/80    Blood Pressure (Exercise) 110/62    Blood Pressure (Exit) 112/62    Heart Rate (Admit) 77 bpm    Heart Rate (Exercise) 98 bpm    Heart Rate (Exit) 80 bpm    Rating of Perceived Exertion (Exercise) 14    Symptoms none    Duration  Continue with 30 min of aerobic exercise without signs/symptoms of physical distress.    Intensity THRR unchanged      Progression   Progression Continue to progress workloads to maintain intensity without signs/symptoms of physical distress.    Average METs 2.25      Resistance Training   Training Prescription Yes    Weight 4 lb    Reps 10-15      Interval Training   Interval Training No      Treadmill   MPH 1.3    Grade 0    Minutes 15    METs 2      NuStep   Level 4    Minutes 15    METs 2.5      Home Exercise Plan   Plans to continue exercise at Home (comment)   walking, leg PT exercises   Frequency Add 3 additional days to program exercise sessions.    Initial Home Exercises Provided 08/07/20           Functional  Capacity:  6 Minute Walk    Row Name 07/21/20 1505 10/01/20 1518       6 Minute Walk   Phase Initial Discharge    Distance 655 feet 725 feet    Distance % Change -- 10.68 %    Distance Feet Change -- 70 ft    Walk Time 6 minutes 6 minutes    # of Rest Breaks 0 0    MPH 1.24 1.37    METS 1 1.18    RPE 13 13    Perceived Dyspnea  2 3    VO2 Peak 3.5 4.16    Symptoms No Yes (comment)    Comments -- SOB, resolved with rest    Resting HR 77 bpm 73 bpm    Resting BP 112/66 122/62    Resting Oxygen Saturation  94 % 92 %    Exercise Oxygen Saturation  during 6 min walk 88 % 90 %    Max Ex. HR 91 bpm 98 bpm    Max Ex. BP 138/66 136/64    2 Minute Post BP 118/68 116/62           Psychological, QOL, Others - Outcomes: PHQ 2/9: Depression screen [facility] 2/9 10/13/2020 08/20/2020 07/21/2020 07/11/2020  Decreased Interest 3 0 0 0  Down, Depressed, Hopeless 0 0 0 0  PHQ - 2 Score 3 0 0 0  Altered sleeping $RemoveBeforeDE'3 1 3 'xEZxpprIvjfcodr$ -  Tired, decreased energy $RemoveBeforeDE'3 2 3 'OeipQpUOCMEPhra$ -  Change in appetite 2 1 0 -  Feeling bad or failure about yourself  3 0 0 -  Trouble concentrating 2 0 0 -  Moving slowly or fidgety/restless 0 1 0 -  Suicidal thoughts - 0 0 -  PHQ-9 Score $RemoveBef'16 5 6 'JsxVLmoFGg$ -   Difficult doing work/chores Somewhat difficult Not difficult at all - -    Quality of Life:  Quality of Life - 10/13/20 1413      Quality of Life   Select Quality of Life      Quality of Life Scores   Health/Function Pre 26.14 %    Health/Function Post 19.63 %    Health/Function % Change -24.9 %    Socioeconomic Pre 30 %    Socioeconomic Post 30 %    Socioeconomic % Change  0 %    Psych/Spiritual Pre 28.29 %    Psych/Spiritual Post 22.14 %    Psych/Spiritual % Change -21.74 %    Family Pre 30 %    Family Post 27.6 %    Family % Change -8 %    GLOBAL Pre 28 %    GLOBAL Post 23.64 %    GLOBAL % Change -15.57 %           Nutrition & Weight - Outcomes:    Nutrition:  Nutrition Therapy & Goals - 07/21/20 1403      Nutrition Therapy   Diet High calorie, High protein pulmonary MNT diet.    Protein (specify units) 80-85g    Fiber 30 grams    Whole Grain Foods 3 servings    Saturated Fats 12 max. grams    Fruits and Vegetables 5 servings/day    Sodium 1.5 grams      Personal Nutrition Goals   Nutrition Goal ST: increase protein at meals LT: gain strength    Comments Pt reports loving eggs and cereal (raisin bran) - whole milk or waffles or pancake. L: sandwich (tomato right now) (white bread) or corn on the  cobb or some beans (pintos) D: pork and beans. Discussed Pulmonary Nutrition, heart healthy nutrition, and high protein nutrition. His COPD is newly diagnosed and he is in remission of cancer - treatments ended in May. Pt reports not getting strength back.      Intervention Plan   Intervention Prescribe, educate and counsel regarding individualized specific dietary modifications aiming towards targeted core components such as weight, hypertension, lipid management, diabetes, heart failure and other comorbidities.;Nutrition handout(s) given to patient.    Expected Outcomes Short Term Goal: Understand basic principles of dietary content, such as calories, fat, sodium,  cholesterol and nutrients.;Long Term Goal: Adherence to prescribed nutrition plan.;Short Term Goal: A plan has been developed with personal nutrition goals set during dietitian appointment.           Nutrition Discharge:  Nutrition Assessments - 07/21/20 1526      MEDFICTS Scores   Pre Score 52           Education Questionnaire Score:  Knowledge Questionnaire Score - 10/13/20 1349      Knowledge Questionnaire Score   Post Score 20/26           Goals reviewed with patient; copy given to patient.

## 2020-10-28 ENCOUNTER — Other Ambulatory Visit: Payer: Self-pay

## 2020-10-28 ENCOUNTER — Ambulatory Visit (INDEPENDENT_AMBULATORY_CARE_PROVIDER_SITE_OTHER): Payer: Medicare Other

## 2020-10-28 DIAGNOSIS — I493 Ventricular premature depolarization: Secondary | ICD-10-CM

## 2020-11-03 ENCOUNTER — Other Ambulatory Visit (HOSPITAL_COMMUNITY): Payer: Self-pay | Admitting: Cardiology

## 2020-11-03 ENCOUNTER — Other Ambulatory Visit: Payer: Self-pay | Admitting: Internal Medicine

## 2020-11-03 NOTE — Telephone Encounter (Signed)
Rx request sent to pharmacy.  

## 2020-11-11 ENCOUNTER — Encounter: Payer: Medicare Other | Admitting: Internal Medicine

## 2020-11-11 DIAGNOSIS — R001 Bradycardia, unspecified: Secondary | ICD-10-CM

## 2020-11-12 ENCOUNTER — Encounter: Payer: Self-pay | Admitting: Internal Medicine

## 2020-12-02 ENCOUNTER — Inpatient Hospital Stay: Payer: Medicare Other | Attending: Oncology

## 2020-12-02 DIAGNOSIS — R972 Elevated prostate specific antigen [PSA]: Secondary | ICD-10-CM | POA: Diagnosis not present

## 2020-12-02 DIAGNOSIS — R978 Other abnormal tumor markers: Secondary | ICD-10-CM | POA: Insufficient documentation

## 2020-12-02 DIAGNOSIS — D649 Anemia, unspecified: Secondary | ICD-10-CM | POA: Diagnosis not present

## 2020-12-02 DIAGNOSIS — C61 Malignant neoplasm of prostate: Secondary | ICD-10-CM | POA: Diagnosis not present

## 2020-12-02 LAB — CBC WITH DIFFERENTIAL/PLATELET
Abs Immature Granulocytes: 0.04 10*3/uL (ref 0.00–0.07)
Basophils Absolute: 0 10*3/uL (ref 0.0–0.1)
Basophils Relative: 0 %
Eosinophils Absolute: 0.2 10*3/uL (ref 0.0–0.5)
Eosinophils Relative: 5 %
HCT: 39.4 % (ref 39.0–52.0)
Hemoglobin: 13.1 g/dL (ref 13.0–17.0)
Immature Granulocytes: 1 %
Lymphocytes Relative: 11 %
Lymphs Abs: 0.5 10*3/uL — ABNORMAL LOW (ref 0.7–4.0)
MCH: 29.8 pg (ref 26.0–34.0)
MCHC: 33.2 g/dL (ref 30.0–36.0)
MCV: 89.7 fL (ref 80.0–100.0)
Monocytes Absolute: 0.5 10*3/uL (ref 0.1–1.0)
Monocytes Relative: 12 %
Neutro Abs: 3.2 10*3/uL (ref 1.7–7.7)
Neutrophils Relative %: 71 %
Platelets: 114 10*3/uL — ABNORMAL LOW (ref 150–400)
RBC: 4.39 MIL/uL (ref 4.22–5.81)
RDW: 13 % (ref 11.5–15.5)
WBC: 4.4 10*3/uL (ref 4.0–10.5)
nRBC: 0 % (ref 0.0–0.2)

## 2020-12-02 LAB — BASIC METABOLIC PANEL
Anion gap: 9 (ref 5–15)
BUN: 18 mg/dL (ref 8–23)
CO2: 27 mmol/L (ref 22–32)
Calcium: 9.3 mg/dL (ref 8.9–10.3)
Chloride: 100 mmol/L (ref 98–111)
Creatinine, Ser: 1.37 mg/dL — ABNORMAL HIGH (ref 0.61–1.24)
GFR, Estimated: 50 mL/min — ABNORMAL LOW (ref >60)
Glucose, Bld: 110 mg/dL — ABNORMAL HIGH (ref 70–99)
Potassium: 4.2 mmol/L (ref 3.5–5.1)
Sodium: 136 mmol/L (ref 135–145)

## 2020-12-02 LAB — PSA: Prostatic Specific Antigen: 0.45 ng/mL (ref 0.00–4.00)

## 2020-12-03 LAB — CANCER ANTIGEN 19-9: CA 19-9: 119 U/mL — ABNORMAL HIGH (ref 0–35)

## 2020-12-05 ENCOUNTER — Emergency Department
Admission: EM | Admit: 2020-12-05 | Discharge: 2020-12-06 | Payer: Medicare Other | Attending: Student in an Organized Health Care Education/Training Program | Admitting: Student in an Organized Health Care Education/Training Program

## 2020-12-05 ENCOUNTER — Other Ambulatory Visit: Payer: Self-pay

## 2020-12-05 ENCOUNTER — Emergency Department: Payer: Medicare Other

## 2020-12-05 ENCOUNTER — Encounter: Payer: Self-pay | Admitting: Radiology

## 2020-12-05 DIAGNOSIS — Z8546 Personal history of malignant neoplasm of prostate: Secondary | ICD-10-CM | POA: Insufficient documentation

## 2020-12-05 DIAGNOSIS — M545 Low back pain, unspecified: Secondary | ICD-10-CM | POA: Diagnosis not present

## 2020-12-05 DIAGNOSIS — S36892A Contusion of other intra-abdominal organs, initial encounter: Secondary | ICD-10-CM | POA: Insufficient documentation

## 2020-12-05 DIAGNOSIS — I5023 Acute on chronic systolic (congestive) heart failure: Secondary | ICD-10-CM | POA: Insufficient documentation

## 2020-12-05 DIAGNOSIS — Z85828 Personal history of other malignant neoplasm of skin: Secondary | ICD-10-CM | POA: Insufficient documentation

## 2020-12-05 DIAGNOSIS — I130 Hypertensive heart and chronic kidney disease with heart failure and stage 1 through stage 4 chronic kidney disease, or unspecified chronic kidney disease: Secondary | ICD-10-CM | POA: Diagnosis not present

## 2020-12-05 DIAGNOSIS — W109XXA Fall (on) (from) unspecified stairs and steps, initial encounter: Secondary | ICD-10-CM | POA: Insufficient documentation

## 2020-12-05 DIAGNOSIS — Z87891 Personal history of nicotine dependence: Secondary | ICD-10-CM | POA: Insufficient documentation

## 2020-12-05 DIAGNOSIS — Z7901 Long term (current) use of anticoagulants: Secondary | ICD-10-CM | POA: Insufficient documentation

## 2020-12-05 DIAGNOSIS — Z7982 Long term (current) use of aspirin: Secondary | ICD-10-CM | POA: Diagnosis not present

## 2020-12-05 DIAGNOSIS — K661 Hemoperitoneum: Secondary | ICD-10-CM

## 2020-12-05 DIAGNOSIS — Z20822 Contact with and (suspected) exposure to covid-19: Secondary | ICD-10-CM | POA: Diagnosis not present

## 2020-12-05 DIAGNOSIS — S3681XA Injury of peritoneum, initial encounter: Secondary | ICD-10-CM | POA: Diagnosis present

## 2020-12-05 DIAGNOSIS — Y9301 Activity, walking, marching and hiking: Secondary | ICD-10-CM | POA: Insufficient documentation

## 2020-12-05 DIAGNOSIS — R55 Syncope and collapse: Secondary | ICD-10-CM | POA: Insufficient documentation

## 2020-12-05 DIAGNOSIS — Z79899 Other long term (current) drug therapy: Secondary | ICD-10-CM | POA: Diagnosis not present

## 2020-12-05 DIAGNOSIS — T1490XA Injury, unspecified, initial encounter: Secondary | ICD-10-CM

## 2020-12-05 DIAGNOSIS — N183 Chronic kidney disease, stage 3 unspecified: Secondary | ICD-10-CM | POA: Diagnosis not present

## 2020-12-05 DIAGNOSIS — Z950 Presence of cardiac pacemaker: Secondary | ICD-10-CM | POA: Diagnosis not present

## 2020-12-05 DIAGNOSIS — W19XXXA Unspecified fall, initial encounter: Secondary | ICD-10-CM

## 2020-12-05 LAB — SAMPLE TO BLOOD BANK

## 2020-12-05 LAB — CBC WITH DIFFERENTIAL/PLATELET
Abs Immature Granulocytes: 0.2 10*3/uL — ABNORMAL HIGH (ref 0.00–0.07)
Basophils Absolute: 0 10*3/uL (ref 0.0–0.1)
Basophils Relative: 0 %
Eosinophils Absolute: 0.1 10*3/uL (ref 0.0–0.5)
Eosinophils Relative: 1 %
HCT: 33.9 % — ABNORMAL LOW (ref 39.0–52.0)
Hemoglobin: 11.1 g/dL — ABNORMAL LOW (ref 13.0–17.0)
Immature Granulocytes: 2 %
Lymphocytes Relative: 6 %
Lymphs Abs: 0.5 10*3/uL — ABNORMAL LOW (ref 0.7–4.0)
MCH: 29.4 pg (ref 26.0–34.0)
MCHC: 32.7 g/dL (ref 30.0–36.0)
MCV: 89.7 fL (ref 80.0–100.0)
Monocytes Absolute: 0.7 10*3/uL (ref 0.1–1.0)
Monocytes Relative: 8 %
Neutro Abs: 7.1 10*3/uL (ref 1.7–7.7)
Neutrophils Relative %: 83 %
Platelets: 105 10*3/uL — ABNORMAL LOW (ref 150–400)
RBC: 3.78 MIL/uL — ABNORMAL LOW (ref 4.22–5.81)
RDW: 13 % (ref 11.5–15.5)
Smear Review: NORMAL
WBC: 8.6 10*3/uL (ref 4.0–10.5)
nRBC: 0 % (ref 0.0–0.2)

## 2020-12-05 LAB — COMPREHENSIVE METABOLIC PANEL
ALT: 23 U/L (ref 0–44)
AST: 25 U/L (ref 15–41)
Albumin: 3.3 g/dL — ABNORMAL LOW (ref 3.5–5.0)
Alkaline Phosphatase: 104 U/L (ref 38–126)
Anion gap: 10 (ref 5–15)
BUN: 20 mg/dL (ref 8–23)
CO2: 25 mmol/L (ref 22–32)
Calcium: 8.6 mg/dL — ABNORMAL LOW (ref 8.9–10.3)
Chloride: 104 mmol/L (ref 98–111)
Creatinine, Ser: 1.46 mg/dL — ABNORMAL HIGH (ref 0.61–1.24)
GFR, Estimated: 47 mL/min — ABNORMAL LOW (ref >60)
Glucose, Bld: 122 mg/dL — ABNORMAL HIGH (ref 70–99)
Potassium: 3.7 mmol/L (ref 3.5–5.1)
Sodium: 139 mmol/L (ref 135–145)
Total Bilirubin: 0.9 mg/dL (ref 0.3–1.2)
Total Protein: 6.3 g/dL — ABNORMAL LOW (ref 6.5–8.1)

## 2020-12-05 LAB — RESP PANEL BY RT-PCR (FLU A&B, COVID) ARPGX2
Influenza A by PCR: NEGATIVE
Influenza B by PCR: NEGATIVE
SARS Coronavirus 2 by RT PCR: NEGATIVE

## 2020-12-05 LAB — LIPASE, BLOOD: Lipase: 34 U/L (ref 11–51)

## 2020-12-05 LAB — TROPONIN I (HIGH SENSITIVITY): Troponin I (High Sensitivity): 16 ng/L (ref <18)

## 2020-12-05 MED ORDER — MORPHINE SULFATE (PF) 4 MG/ML IV SOLN
4.0000 mg | INTRAVENOUS | Status: DC | PRN
  Administered 2020-12-05: 4 mg via INTRAVENOUS
  Filled 2020-12-05: qty 1

## 2020-12-05 MED ORDER — IOHEXOL 300 MG/ML  SOLN
100.0000 mL | Freq: Once | INTRAMUSCULAR | Status: AC | PRN
  Administered 2020-12-05: 20:00:00 100 mL via INTRAVENOUS

## 2020-12-05 MED ORDER — ONDANSETRON HCL 4 MG/2ML IJ SOLN
4.0000 mg | Freq: Once | INTRAMUSCULAR | Status: AC
  Administered 2020-12-05: 22:00:00 4 mg via INTRAVENOUS
  Filled 2020-12-05: qty 2

## 2020-12-05 MED ORDER — SODIUM CHLORIDE 0.9 % IV SOLN: Freq: Once | INTRAVENOUS | Status: AC

## 2020-12-05 MED ORDER — FENTANYL CITRATE (PF) 100 MCG/2ML IJ SOLN
50.0000 ug | INTRAMUSCULAR | Status: DC | PRN
  Administered 2020-12-05 – 2020-12-06 (×3): 50 ug via INTRAVENOUS
  Filled 2020-12-05 (×2): qty 2

## 2020-12-05 NOTE — ED Notes (Signed)
Santiago Glad to [employer].

## 2020-12-05 NOTE — ED Notes (Signed)
2006 To xray

## 2020-12-05 NOTE — ED Notes (Signed)
[facility] to transport, Warehouse manager - ED to ED, Call for report [phone]. [facility] in route now.

## 2020-12-05 NOTE — ED Provider Notes (Signed)
[facility] Emergency Department Provider Note    Event Date/Time   First MD Initiated Contact with Patient 12/05/20 1842     (approximate)  I have reviewed the triage vital signs and the nursing notes.   HISTORY  Chief Complaint Fall   HPI HAWKEN BIELBY is a 86 y.o. male with extensive past medical history as listed below presents to the ER for syncopal episode that occurred while patient was walking up steps fell backwards landing on his back.  Does not remember all the details of the fall can remember if he lost consciousness.  Was found to be hypotensive by EMS.  Was given IV fluids with improvement in his blood pressure.  He is having severe low back pain.  Does not recall having any symptoms prior to the fall.  Rates it as moderate to severe nonradiating.    Past Medical History:  Diagnosis Date  . Anginal pain ([place])   . Aortic stenosis   . Aortic stenosis   . Basal cell carcinoma, ear 03/31/2015  . Bradycardia 07/03/2014  . CHF (congestive heart failure) ([place])   . DDD (degenerative disc disease), lumbar 08/06/2015  . H/O cardiac catheterization 07/03/2014   Overview:  Cypher stent proximal and distal RCA 07/18/05 and TAXUS stent mid LAD 07/20/05 at [facility]  . HTN (hypertension) 07/03/2014  . Hyperlipidemia 07/03/2014  . Ischemic cardiomyopathy   . Lumbosacral radiculopathy at S1 08/22/2015  . MI (myocardial infarction) ([place]) 07/03/2014   Overview:  Mi 07/17/05  . Myocardial infarction ([place])   . Normocytic normochromic anemia 09/14/2015  . Pacemaker 07/03/2014   Overview:  Dual chamber pacemaker 08/11/11  . Pancreatic mass    not hypermetabolic on PET-CT imaging  . Prostate cancer ([place])    metastatic  . 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 Shortness of breath dyspnea   . SOB (shortness of breath) on exertion 05/13/2016   Family History   Problem Relation Age of Onset  . Heart attack Father   . Bladder Cancer Neg Hx   . Kidney cancer Neg Hx   . Prostate cancer Neg Hx    Past Surgical History:  Procedure Laterality Date  . CARDIAC CATHETERIZATION Bilateral 07/14/2016   Procedure: Right/Left Heart Cath and Coronary Angiography;  Surgeon: Isaias Cowman, MD;  Location: [place] [facility];  Service: Cardiovascular;  Laterality: Bilateral;  . CORONARY ANGIOPLASTY    . HERNIA REPAIR    . LEFT HEART CATH AND CORONARY ANGIOGRAPHY N/A 05/09/2019   Procedure: LEFT HEART CATH AND CORONARY ANGIOGRAPHY;  Surgeon: Corey Skains, MD;  Location: [place] [facility];  Service: Cardiovascular;  Laterality: N/A;  . PROSTATE BIOPSY N/A 12/11/2019   Procedure: PROSTATE BIOPSY;  Surgeon: Abbie Sons, MD;  Location: [facility] ORS;  Service: Urology;  Laterality: N/A;  . RIGHT HEART CATH N/A 05/17/2019   Procedure: RIGHT HEART CATH;  Surgeon: Larey Dresser, MD;  Location: [place] [facility];  Service: Cardiovascular;  Laterality: N/A;  . RIGHT HEART CATH AND CORONARY ANGIOGRAPHY N/A 05/22/2020   Procedure: RIGHT HEART CATH AND CORONARY ANGIOGRAPHY;  Surgeon: Larey Dresser, MD;  Location: [place] [facility];  Service: Cardiovascular;  Laterality: N/A;  . TEE WITHOUT CARDIOVERSION N/A 05/14/2020   Procedure: TRANSESOPHAGEAL ECHOCARDIOGRAM (TEE);  Surgeon: Larey Dresser, MD;  Location: [facility] ENDOSCOPY;  Service:  Cardiovascular;  Laterality: N/A;  . TEE WITHOUT CARDIOVERSION N/A 06/17/2020   Procedure: TRANSESOPHAGEAL ECHOCARDIOGRAM (TEE);  Surgeon: Burnell Blanks, MD;  Location: [place];  Service: Open Heart Surgery;  Laterality: N/A;  . TRANSCATHETER AORTIC VALVE REPLACEMENT, TRANSFEMORAL N/A 06/17/2020   Procedure: TRANSCATHETER AORTIC VALVE REPLACEMENT, TRANSFEMORAL;  Surgeon: Burnell Blanks, MD;  Location: [place];  Service: Open Heart Surgery;  Laterality: N/A;  . TRANSRECTAL ULTRASOUND N/A 12/11/2019   Procedure:  TRANSRECTAL ULTRASOUND;  Surgeon: Abbie Sons, MD;  Location: [facility] ORS;  Service: Urology;  Laterality: N/A;   Patient Active Problem List   Diagnosis Date Noted  . 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 Pancreatic mass 02/02/2020  . Prostate cancer ([place]) 02/02/2020  . Generalized weakness 12/13/2019  . Rectal pain 12/11/2019  . Thrombocytopenia ([place]) 12/11/2019  . Cerebral embolism with cerebral infarction 05/20/2019  . Acute on chronic systolic congestive heart failure ([place])   . Thoracic aortic aneurysm ([place]) 02/09/2018  . CKD (chronic kidney disease) stage 3, GFR 30-59 ml/min 02/03/2017  . 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 Chronic anemia 09/14/2015  . Lumbosacral radiculopathy at S1 08/22/2015  . DDD (degenerative disc disease), lumbar 08/06/2015  . Basal cell carcinoma, ear 03/31/2015  . History of nonmelanoma skin cancer 11/18/2014  . Severe aortic stenosis 07/03/2014  . Bradycardia 07/03/2014  . HTN (hypertension) 07/03/2014  . Hyperlipidemia 07/03/2014  . ST elevation myocardial infarction involving right coronary artery ([place]) 07/03/2014  . Pacemaker 07/03/2014  . Pain in joint involving pelvic region and thigh 11/27/2012  . Trochanteric bursitis 11/27/2012      Prior to Admission medications   Medication Sig Start Date End Date Taking? Authorizing Provider  albuterol (VENTOLIN HFA) 108 (90 Base) MCG/ACT inhaler Inhale 1-2 puffs into the lungs every 6 (six) hours as needed for wheezing or shortness of breath. 08/27/20   Larey Dresser, MD  amiodarone (PACERONE) 200 MG tablet TAKE 1 TABLET BY MOUTH TWICE A DAY 11/03/20   Deboraha Sprang, MD  aspirin 81 MG chewable tablet Chew 1 tablet (81 mg total) by mouth daily. 06/19/20   Eileen Stanford, PA-C  atorvastatin (LIPITOR) 20 MG tablet Take 1 tablet (20 mg total) by mouth daily. 09/15/20 12/14/20  Deboraha Sprang, MD  carvedilol (COREG) 6.25 MG tablet  TAKE 1 TABLET BY MOUTH 2 TIMES DAILY WITH A MEAL 08/08/20   Larey Dresser, MD  clopidogrel (PLAVIX) 75 MG tablet Take 1 tablet (75 mg total) by mouth daily. 06/19/20   Eileen Stanford, PA-C  furosemide (LASIX) 20 MG tablet Take 1 tablet (20 mg total) by mouth as needed (weight. Take one tablet if weight increases by 3 pounds overnight or 5 pounds in one week.). 07/11/20 07/11/21  Larey Dresser, MD  ipratropium-albuterol (DUONEB) 0.5-2.5 (3) MG/3ML SOLN Take 3 mLs by nebulization every 6 (six) hours as needed.    [provider]  losartan (COZAAR) 25 MG tablet TAKE 1/2 TABLET (12.5MG  TOTAL) BY MOUTH DAILY 11/03/20   Larey Dresser, MD  spironolactone (ALDACTONE) 25 MG tablet TAKE 1 TABLET BY MOUTH EVERYDAY AT BEDTIME 09/02/20   Larey Dresser, MD  tiotropium (SPIRIVA HANDIHALER) 18 MCG inhalation capsule Place 1 capsule (18 mcg total) into inhaler and inhale daily. 08/27/20   Larey Dresser, MD  venlafaxine (EFFEXOR) 75 MG tablet Take 75 mg by mouth as needed.    [provider]    Allergies  Patient has no known allergies.    Social History Social History   Tobacco Use  . Smoking status: Former Smoker    Years: 10.00    Types: Cigarettes    Quit date: 1972    Years since quitting: 49.9  . Smokeless tobacco: Former Systems developer    Types: Chew  . Tobacco comment: Quit 1977  Vaping Use  . Vaping Use: Never used  Substance Use Topics  . Alcohol use: No  . Drug use: No    Review of Systems Patient denies headaches, rhinorrhea, blurry vision, numbness, shortness of breath, chest pain, edema, cough, abdominal pain, nausea, vomiting, diarrhea, dysuria, fevers, rashes or hallucinations unless otherwise stated above in HPI. ____________________________________________   PHYSICAL EXAM:  VITAL SIGNS: Vitals:   12/05/20 2200 12/05/20 2300  BP: (!) 112/57 125/67  Pulse: 64 70  Resp: 14 16  Temp:    SpO2: 98% 99%    Constitutional: Alert and oriented. Frail  appearing Eyes: Conjunctivae are normal.  Head: Atraumatic. Nose: No congestion/rhinnorhea. Mouth/Throat: Mucous membranes are moist.   Neck: No stridor. Painless ROM.  Cardiovascular: Normal rate, regular rhythm. Grossly normal heart sounds.  Good peripheral circulation. Respiratory: Normal respiratory effort.  No retractions. Lungs CTAB. Gastrointestinal: Soft with left sided ttp. No distention. No abdominal bruits. No CVA tenderness. Genitourinary: deferred Musculoskeletal:  + lumbar ttp, left paraspinal ttp.  No step off or deformity.  MAE spontaneously.   No lower extremity tenderness nor edema.  No joint effusions. Neurologic:  Normal speech and language. No gross focal neurologic deficits are appreciated. No facial droop Skin:  Skin is warm, dry and intact. No rash noted. Psychiatric: calm  ____________________________________________   LABS (all labs ordered are listed, but only abnormal results are displayed)  Results for orders placed or performed during the hospital encounter of 12/05/20 (from the past 24 hour(s))  Resp Panel by RT-PCR (Flu A&B, Covid) Nasopharyngeal Swab     Status: None   Collection Time: 12/05/20  6:32 PM   Specimen: Nasopharyngeal Swab; Nasopharyngeal(NP) swabs in vial transport medium  Result Value Ref Range   SARS Coronavirus 2 by RT PCR NEGATIVE NEGATIVE   Influenza A by PCR NEGATIVE NEGATIVE   Influenza B by PCR NEGATIVE NEGATIVE  Troponin I (High Sensitivity)     Status: None   Collection Time: 12/05/20  7:07 PM  Result Value Ref Range   Troponin I (High Sensitivity) 16 <18 ng/L  CBC with Differential     Status: Abnormal   Collection Time: 12/05/20  7:07 PM  Result Value Ref Range   WBC 8.6 4.0 - 10.5 K/uL   RBC 3.78 (L) 4.22 - 5.81 MIL/uL   Hemoglobin 11.1 (L) 13.0 - 17.0 g/dL   HCT 33.9 (L) 39.0 - 52.0 %   MCV 89.7 80.0 - 100.0 fL   MCH 29.4 26.0 - 34.0 pg   MCHC 32.7 30.0 - 36.0 g/dL   RDW 13.0 11.5 - 15.5 %   Platelets 105 (L) 150 -  400 K/uL   nRBC 0.0 0.0 - 0.2 %   Neutrophils Relative % 83 %   Neutro Abs 7.1 1.7 - 7.7 K/uL   Lymphocytes Relative 6 %   Lymphs Abs 0.5 (L) 0.7 - 4.0 K/uL   Monocytes Relative 8 %   Monocytes Absolute 0.7 0.1 - 1.0 K/uL   Eosinophils Relative 1 %   Eosinophils Absolute 0.1 0.0 - 0.5 K/uL   Basophils Relative 0 %   Basophils Absolute 0.0  0.0 - 0.1 K/uL   WBC Morphology MORPHOLOGY UNREMARKABLE    Smear Review Normal platelet morphology    Immature Granulocytes 2 %   Abs Immature Granulocytes 0.20 (H) 0.00 - 0.07 K/uL   Schistocytes PRESENT    Burr Cells PRESENT    Polychromasia PRESENT   Comprehensive metabolic panel     Status: Abnormal   Collection Time: 12/05/20  7:07 PM  Result Value Ref Range   Sodium 139 135 - 145 mmol/L   Potassium 3.7 3.5 - 5.1 mmol/L   Chloride 104 98 - 111 mmol/L   CO2 25 22 - 32 mmol/L   Glucose, Bld 122 (H) 70 - 99 mg/dL   BUN 20 8 - 23 mg/dL   Creatinine, Ser 1.46 (H) 0.61 - 1.24 mg/dL   Calcium 8.6 (L) 8.9 - 10.3 mg/dL   Total Protein 6.3 (L) 6.5 - 8.1 g/dL   Albumin 3.3 (L) 3.5 - 5.0 g/dL   AST 25 15 - 41 U/L   ALT 23 0 - 44 U/L   Alkaline Phosphatase 104 38 - 126 U/L   Total Bilirubin 0.9 0.3 - 1.2 mg/dL   GFR, Estimated 47 (L) >60 mL/min   Anion gap 10 5 - 15  Lipase, blood     Status: None   Collection Time: 12/05/20  7:07 PM  Result Value Ref Range   Lipase 34 11 - 51 U/L  Sample to Blood Bank     Status: None   Collection Time: 12/05/20  7:07 PM  Result Value Ref Range   Blood Bank Specimen SAMPLE AVAILABLE FOR TESTING    Sample Expiration      12/08/2020,2359 Performed at [facility], [address] [place], [place] [address]    ____________________________________________  EKG My review and personal interpretation at Time: 18:52   Indication: hypotension  Rate: 62  Rhythm: sinus Axis: normal Other: normal intervals, nonspecific st abn, no stemi ____________________________________________  RADIOLOGY  I  personally reviewed all radiographic images ordered to evaluate for the above acute complaints and reviewed radiology reports and findings.  These findings were personally discussed with the patient.  Please see medical record for radiology report.  ____________________________________________   PROCEDURES  Procedure(s) performed:  .Critical Care Performed by: Merlyn Lot, MD Authorized by: Merlyn Lot, MD   Critical care provider statement:    Critical care time (minutes):  35   Critical care time was exclusive of:  Separately billable procedures and treating other patients   Critical care was time spent personally by me on the following activities:  Development of treatment plan with patient or surrogate, discussions with consultants, evaluation of patient's response to treatment, examination of patient, obtaining history from patient or surrogate, ordering and performing treatments and interventions, ordering and review of laboratory studies, ordering and review of radiographic studies, pulse oximetry, re-evaluation of patient's condition and review of old charts      Critical Care performed: yes  ____________________________________________   INITIAL IMPRESSION / ASSESSMENT AND PLAN / ED COURSE  Pertinent labs & imaging results that were available during my care of the patient were reviewed by me and considered in my medical decision making (see chart for details).   DDX: sah, sdh, edh, fracture, contusion, soft tissue injury, viscous injury, concussion, hemorrhage   COSMO TETREAULT is a 86 y.o. who presents to the ED with presentation as described above.  Patient frail elderly since past medical history on Plavix presents to the ER after a fall  down a few steps.  Sounds like mechanical fall however patient somewhat of a poor historian may have been more sit near syncopal episode was hypotensive on EMS evaluation now normotensive.  Given mechanism of injury with  hypotension CT imaging ordered for the but differential.  CT imaging with no acute intracranial abnormality no cervical spine fracture.  Does have evidence of lumbar fractures as well as retroperitoneal hematoma without active extravasation at this time.  Clinical Course as of 12/05/20 2337  Fri Dec 05, 2020  2137 Have paged trauma team at [facility] for transfer.  Patient remains hemodynamically stable is having quite a bit of pain.  Will give additional IV narcotics.  No hypotension since initial report by EMS.  Blood work is otherwise reassuring.  Family updated. [PR]  2155 Case discussed with trauma team at [facility].  Has requested ER to ER transfer given age and may need hospitalist admission given possible syncopal event causing this episode of hypotension presentation. [PR]  2205 [facility] refusing to accept 2/2 capacity. [PR]  [facility] is on diversion.  Have consulted [facility] [PR]  2243 [facility] is on diversion. no bed capacity.  Will call [facility]. [PR]  [facility]32 [facility] is currently except to the patient is a level 2 trauma.  Patient family updated.  Agreeable with plan for transfer. [PR]    Clinical Course User Index [PR] Merlyn Lot, MD    The patient was evaluated in Emergency Department today for the symptoms described in the history of present illness. He/she was evaluated in the context of the global COVID-19 pandemic, which necessitated consideration that the patient might be at risk for infection with the SARS-CoV-2 virus that causes COVID-19. Institutional protocols and algorithms that pertain to the evaluation of patients at risk for COVID-19 are in a state of rapid change based on information released by regulatory bodies including the CDC and federal and state organizations. These policies and algorithms were followed during the patient's care in the ED.  As part of my medical decision making, I reviewed the following data within the [place]  notes reviewed and incorporated, Labs reviewed, notes from prior ED visits and [place] Controlled Substance Database   ____________________________________________   FINAL CLINICAL IMPRESSION(S) / ED DIAGNOSES  Final diagnoses:  Trauma  Retroperitoneal hematoma  Fall, initial encounter      NEW MEDICATIONS STARTED DURING THIS VISIT:  New Prescriptions   No medications on file     Note:  This document was prepared using Dragon voice recognition software and may include unintentional dictation errors.    Merlyn Lot, MD 12/05/20 2337

## 2020-12-05 NOTE — ED Triage Notes (Signed)
EMS states pt had syncopal episode walking up stairs.  Upon arrival to home, pt was hypotensive at 82/50, pale and diaphoretic.  O2 was 90% RA.  20G inserted to RAC, 400 NS given en route.

## 2020-12-06 NOTE — ED Notes (Signed)
Attempted to call transfer line to alert Kristopher Fernandez of pt leaving ER here.  Phone continues to just ring.  Called ER and gave report to charge nurse.

## 2020-12-08 LAB — PATHOLOGIST SMEAR REVIEW

## 2020-12-17 ENCOUNTER — Encounter: Payer: Self-pay | Admitting: Radiation Oncology

## 2020-12-20 DEATH — deceased

## 2021-01-01 ENCOUNTER — Telehealth: Payer: Self-pay | Admitting: Internal Medicine

## 2021-01-01 NOTE — Telephone Encounter (Signed)
Attempted to contact the patient. No answer- voice mail box is full.  Will call back at a later time.

## 2021-01-01 NOTE — Telephone Encounter (Signed)
14 day ZIO monitor results per Dr. Caryl Comes:  Findings HR  avg 33  Min 51-Max 144  PVCs Rare, less than 1%   PACs Rare, less than 1% VT Nonsustained 23 episodes; fastest 144 bpm for 2.4 secs; longest for 67min at 103  bpm  No reported ysmptoms Recommendations Continue amiodarone

## 2021-01-02 NOTE — Telephone Encounter (Signed)
My chart message received late yesterday from the patient's daughter as stated below:  Dad fell and passed away at [facility]. Please cancel Emile Kyllo' next appointment. Thank you,  Jame Morrell   Per the subject line in the MyChart message, this happened on 18-Dec-2020.  To Dr. Caryl Comes as an Juluis Rainier.

## 2021-02-17 ENCOUNTER — Encounter: Payer: Medicare Other | Admitting: Internal Medicine

## 2021-02-25 ENCOUNTER — Ambulatory Visit: Payer: Medicare Other | Admitting: Radiation Oncology

## 2021-02-25 ENCOUNTER — Other Ambulatory Visit: Payer: Medicare Other

## 2021-03-02 ENCOUNTER — Ambulatory Visit: Payer: Medicare Other

## 2021-03-02 ENCOUNTER — Ambulatory Visit: Payer: Medicare Other | Admitting: Oncology

## 2021-03-02 ENCOUNTER — Other Ambulatory Visit: Payer: Medicare Other

## 2021-03-04 ENCOUNTER — Other Ambulatory Visit: Payer: Medicare Other

## 2021-03-04 ENCOUNTER — Ambulatory Visit: Payer: Medicare Other

## 2021-03-04 ENCOUNTER — Ambulatory Visit: Payer: Medicare Other | Admitting: Radiation Oncology

## 2021-03-04 ENCOUNTER — Ambulatory Visit: Payer: Medicare Other | Admitting: Oncology

## 2021-09-26 IMAGING — CT NM PET TUM IMG INITIAL (PI) SKULL BASE T - THIGH
9 series · 24 of 25 positions shown · non-contrast
Comparison: CT 01/09/2020

CLINICAL DATA: Initial treatment strategy for pancreatic carcinoma.
Additional history of prostate carcinoma.

EXAM:
NUCLEAR MEDICINE PET SKULL BASE TO THIGH
TECHNIQUE: 9.9 mCi F-18 FDG was injected intravenously. Full-ring PET imaging
was performed from the skull base to thigh after the radiotracer. CT
data was obtained and used for attenuation correction and anatomic
localization.
Fasting blood glucose: 108 mg/dl

[Series 3: ct wb 5.0 b30f · axial · 5.0mm · 0.98mm/px · z∈[-1090,-106]mm · 3 of 329 slices shown]
[im 1/329]
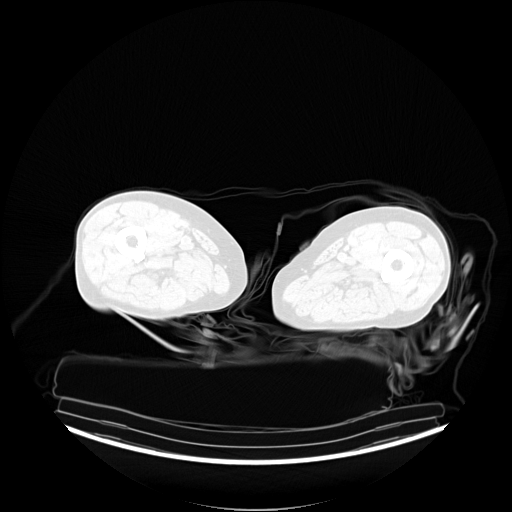
[im 165/329]
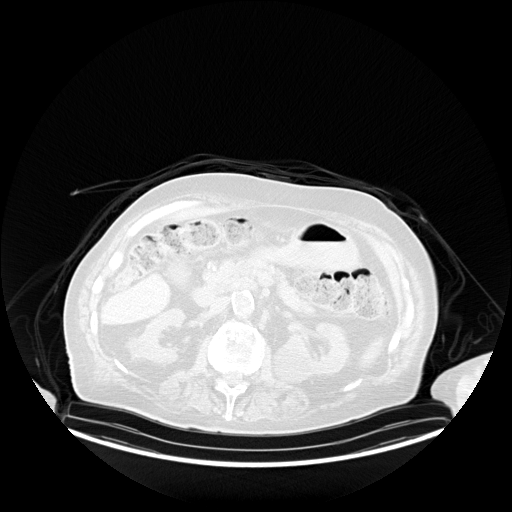
[im 329/329  brain]
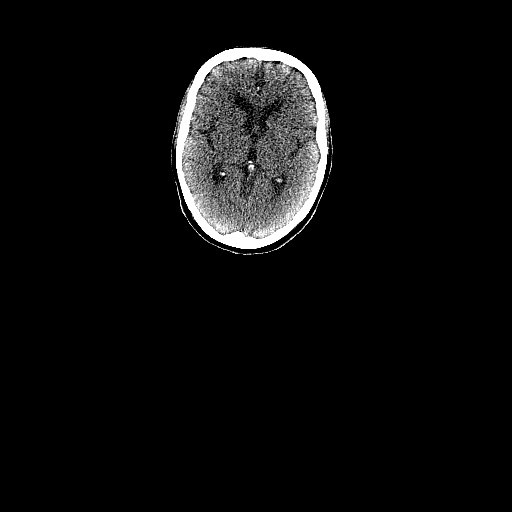

[Series 4: pet wb (ac) · axial · 5.0mm · 3.13mm/px · z∈[-1090,-106]mm · 3 of 329 slices shown]
[im 1/329]
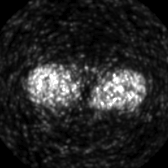
[im 165/329]
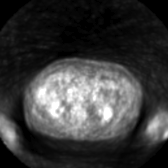
[im 329/329]
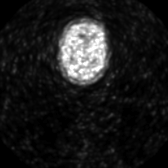

[Series 5: pet wb uncorrected (nac) · axial · 5.0mm · 4.07mm/px · z∈[-1090,-106]mm · 4 of 329 slices shown]
[im 1/329]
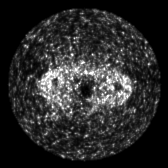
[im 110/329]
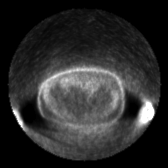
[im 219/329]
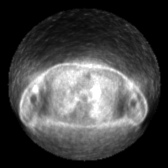
[im 329/329]
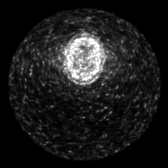

[Series 603: pet axial fused · 3 of 329 slices shown]
[im 1/329]
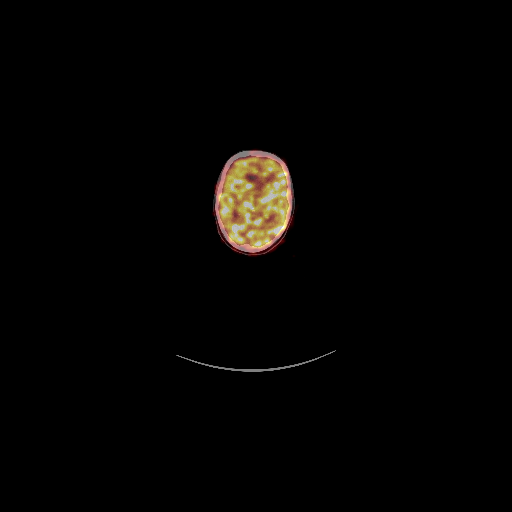
[im 110/329]
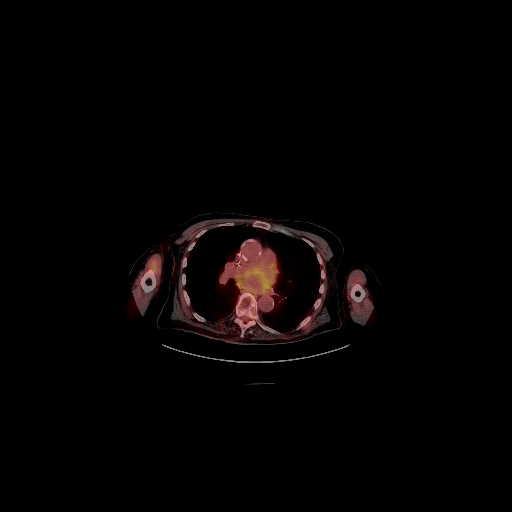
[im 329/329]
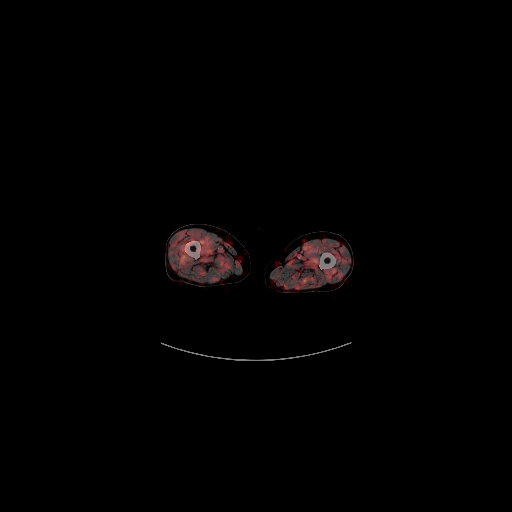

[Series 604: pet coronal fused · 1 of 115 slices shown]
[im 1/115]
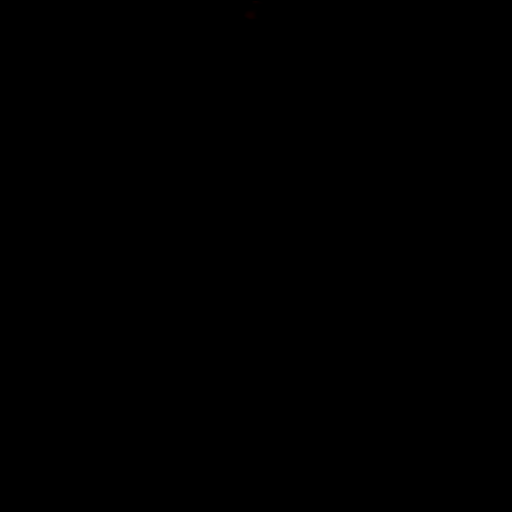

[Series 605: pet sagittal fused · 2 of 164 slices shown]
[im 1/164]
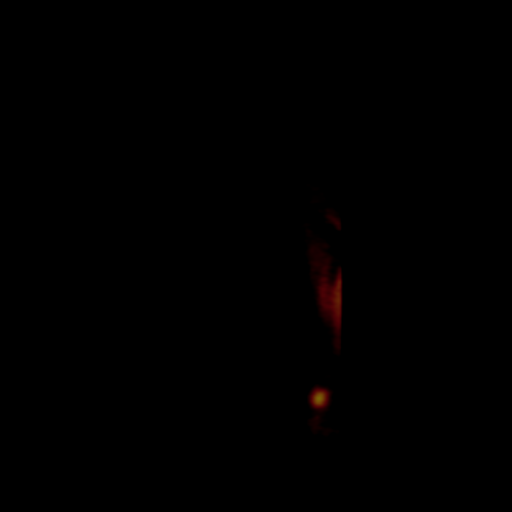
[im 164/164]
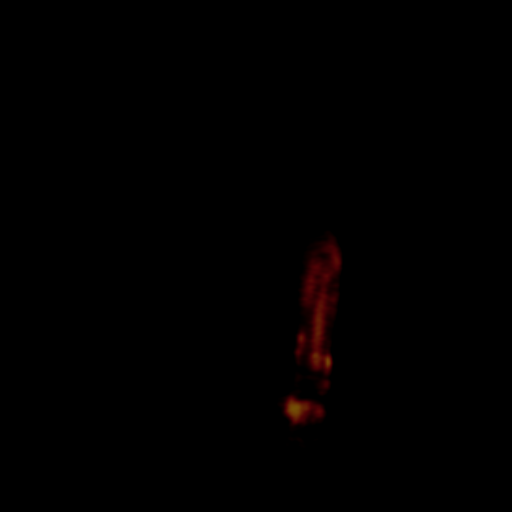

[Series 606: pet axial · 4 of 328 slices shown]
[im 1/328]
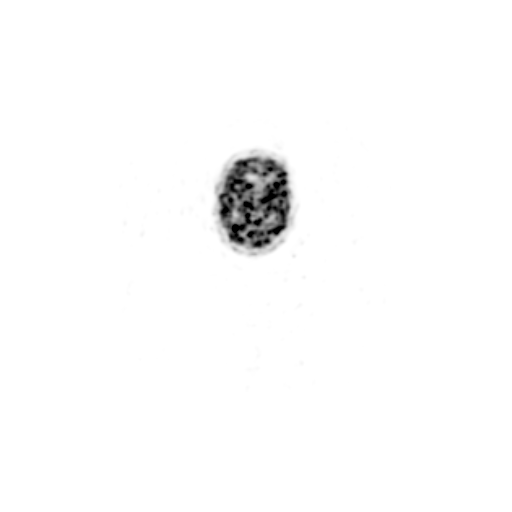
[im 110/328]
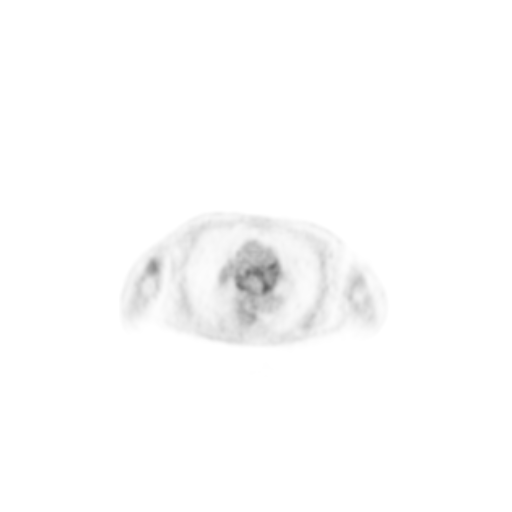
[im 219/328]
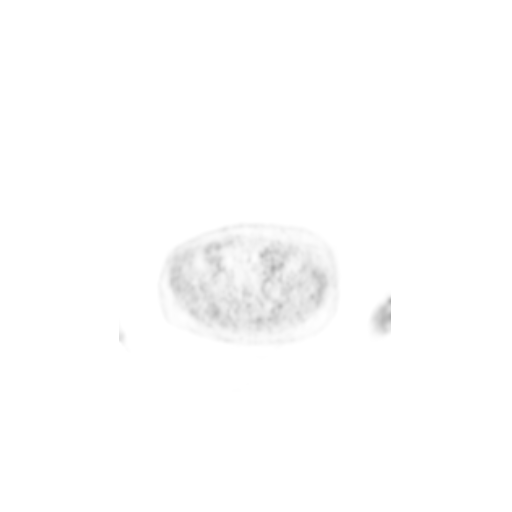
[im 328/328]
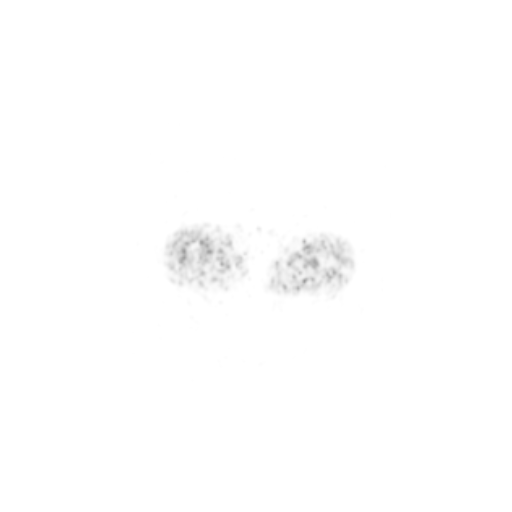

[Series 607: pet coronal · 2 of 144 slices shown]
[im 1/144]
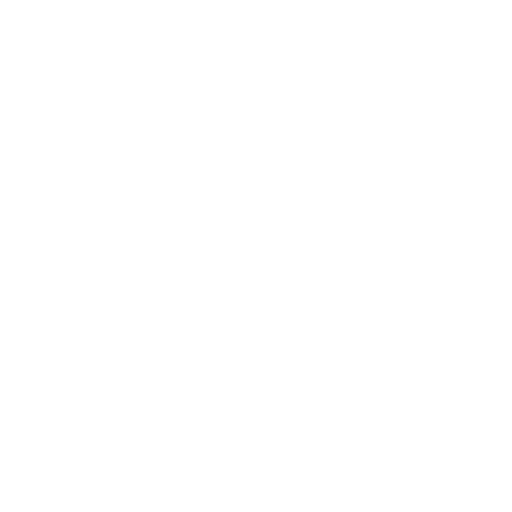
[im 144/144]
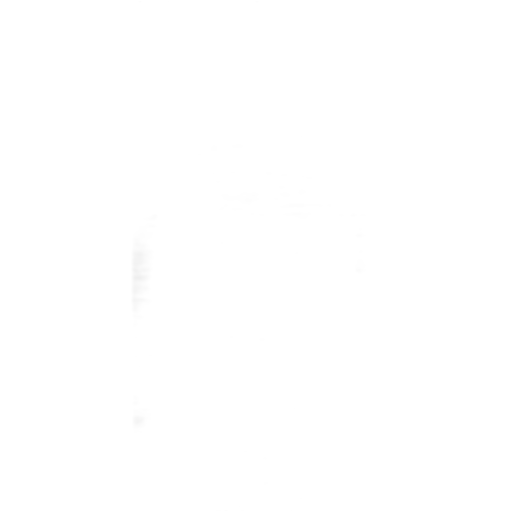

[Series 608: pet sagittal · 2 of 176 slices shown]
[im 1/176]
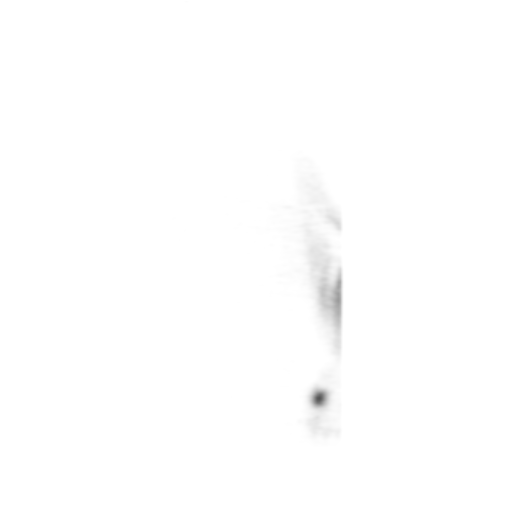
[im 176/176]
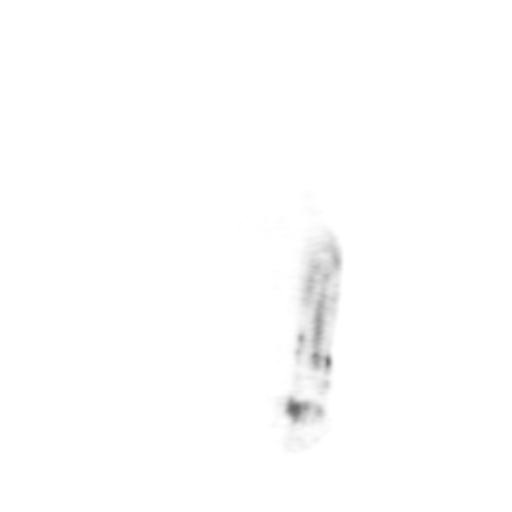

[24 of 25 positions shown; findings below may reference images not displayed]

FINDINGS: Mediastinal blood pool activity: SUV max

Liver activity: SUV max

NECK: No hypermetabolic lymph nodes in the neck.

Incidental CT findings: none

CHEST: Hypermetabolic RIGHT hilar lymph node measures less than 10
mm with SUV max equal 4.8. Small hypermetabolic LEFT axillary lymph
node with SUV max equal 3.3.

Incidental CT findings: Small 2 mm pulmonary nodule in the lingula
(image 111/3).

ABDOMEN/PELVIS: There is no focal abnormal metabolic activity within
the pancreas. Particular attention directed to region of low-density
described on comparison diagnostic CT.

No abnormal metabolic activity in the liver. No hypermetabolic upper
abdominal lymph nodes.

There are two enlarged hypermetabolic LEFT iliac lymph nodes. LEFT
external iliac lymph node measuring 17 mm (image 224/3) with SUV max
equal 4.7. Second LEFT internal iliac lymph node measuring 10 mm
with SUV max equal 4.4.

Nonspecific metabolic activity within the prostate gland.

Incidental CT findings: Atherosclerotic calcification of the aorta.
Gallstones noted.

SKELETON: No focal hypermetabolic activity to suggest skeletal
metastasis.

Incidental CT findings: Loss of bone density in the thoracic spine
and upper lumbar spine. Anterior osteophytosis. Findings most
consistent with degenerative change/osteoporosis. No abnormal
metabolic activity.
IMPRESSION: 1. No metabolic activity in the body the pancreas to correspond to
low-density lesion on comparison diagnostic PET CT scan. No evidence
of pancreatic cancer metastasis.
2. Two enlarged hypermetabolic LEFT iliac lymph nodes are favored to
PROSTATE CANCER NODAL METASTASIS.
3. Moderate hypermetabolic activity associated with bilateral small
hilar lymph nodes are favored reactive.
4. No evidence skeletal metastasis.
